# Patient Record
Sex: Male | Born: 1985 | Race: Black or African American | Hispanic: No | Marital: Single | Smoking: Never smoker
Health system: Southern US, Community
[De-identification: ages and names within clinical notes are randomized; demographics above are authoritative.]

## PROBLEM LIST (undated history)

## (undated) DIAGNOSIS — I2699 Other pulmonary embolism without acute cor pulmonale: Secondary | ICD-10-CM

## (undated) DIAGNOSIS — K219 Gastro-esophageal reflux disease without esophagitis: Secondary | ICD-10-CM

## (undated) DIAGNOSIS — R0789 Other chest pain: Secondary | ICD-10-CM

## (undated) DIAGNOSIS — I10 Essential (primary) hypertension: Secondary | ICD-10-CM

## (undated) DIAGNOSIS — E119 Type 2 diabetes mellitus without complications: Secondary | ICD-10-CM

## (undated) HISTORY — DX: Other pulmonary embolism without acute cor pulmonale: I26.99

## (undated) HISTORY — DX: Gastro-esophageal reflux disease without esophagitis: K21.9

## (undated) HISTORY — DX: Other chest pain: R07.89

## (undated) HISTORY — PX: NO PAST SURGERIES: SHX2092

## (undated) HISTORY — DX: Type 2 diabetes mellitus without complications: E11.9

---

## 2008-11-28 ENCOUNTER — Emergency Department (HOSPITAL_COMMUNITY): Admission: EM | Admit: 2008-11-28 | Discharge: 2008-11-28 | Payer: Self-pay | Admitting: Emergency Medicine

## 2012-10-14 ENCOUNTER — Emergency Department (HOSPITAL_COMMUNITY): Payer: Self-pay

## 2012-10-14 ENCOUNTER — Emergency Department (HOSPITAL_COMMUNITY)
Admission: EM | Admit: 2012-10-14 | Discharge: 2012-10-14 | Disposition: A | Discharge status: Home / Self Care | Payer: Self-pay | Attending: Emergency Medicine | Admitting: Emergency Medicine

## 2012-10-14 ENCOUNTER — Encounter (HOSPITAL_COMMUNITY): Payer: Self-pay | Admitting: Emergency Medicine

## 2012-10-14 DIAGNOSIS — Z87891 Personal history of nicotine dependence: Secondary | ICD-10-CM | POA: Insufficient documentation

## 2012-10-14 DIAGNOSIS — R079 Chest pain, unspecified: Secondary | ICD-10-CM | POA: Insufficient documentation

## 2012-10-14 LAB — TROPONIN I
Troponin I: <0.3 ng/mL (ref <0.30)
Troponin I: <0.3 ng/mL (ref <0.30)

## 2012-10-14 MED ORDER — ASPIRIN 81 MG PO CHEW: 324.0000 mg | CHEWABLE_TABLET | Freq: Once | ORAL | Status: DC

## 2012-10-14 NOTE — ED Notes (Signed)
Patient transported to X-ray 

## 2012-10-14 NOTE — ED Notes (Signed)
The pt is comfortable nsr on the monitor.  Mild dull chest pain.  famiily at the bedside.  Asking for po fluids will inquire
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
## 2014-10-28 ENCOUNTER — Encounter (HOSPITAL_COMMUNITY): Payer: Self-pay | Admitting: Emergency Medicine

## 2014-10-28 ENCOUNTER — Emergency Department (HOSPITAL_COMMUNITY)
Admission: EM | Admit: 2014-10-28 | Discharge: 2014-10-28 | Disposition: A | Discharge status: Home / Self Care | Payer: Self-pay | Attending: Emergency Medicine | Admitting: Emergency Medicine

## 2014-10-28 ENCOUNTER — Emergency Department (HOSPITAL_COMMUNITY): Payer: Self-pay

## 2014-10-28 DIAGNOSIS — S8011XA Contusion of right lower leg, initial encounter: Secondary | ICD-10-CM | POA: Insufficient documentation

## 2014-10-28 DIAGNOSIS — Y998 Other external cause status: Secondary | ICD-10-CM | POA: Insufficient documentation

## 2014-10-28 DIAGNOSIS — R000 Tachycardia, unspecified: Secondary | ICD-10-CM | POA: Insufficient documentation

## 2014-10-28 DIAGNOSIS — S20219A Contusion of unspecified front wall of thorax, initial encounter: Secondary | ICD-10-CM | POA: Insufficient documentation

## 2014-10-28 DIAGNOSIS — Z87891 Personal history of nicotine dependence: Secondary | ICD-10-CM | POA: Insufficient documentation

## 2014-10-28 DIAGNOSIS — Y92410 Unspecified street and highway as the place of occurrence of the external cause: Secondary | ICD-10-CM | POA: Insufficient documentation

## 2014-10-28 DIAGNOSIS — S20211A Contusion of right front wall of thorax, initial encounter: Secondary | ICD-10-CM

## 2014-10-28 DIAGNOSIS — Y9389 Activity, other specified: Secondary | ICD-10-CM | POA: Insufficient documentation

## 2014-10-28 MED ORDER — HYDROCODONE-ACETAMINOPHEN 7.5-325 MG PO TABS: 1.0000 | ORAL_TABLET | Freq: Four times a day (QID) | ORAL | Status: DC | PRN

## 2014-10-28 MED ORDER — HYDROCODONE-ACETAMINOPHEN 5-325 MG PO TABS
2.0000 | ORAL_TABLET | Freq: Once | ORAL | Status: AC
  Administered 2014-10-28: 2 via ORAL
  Filled 2014-10-28: qty 2

## 2014-10-28 MED ORDER — NAPROXEN 500 MG PO TABS: 500.0000 mg | ORAL_TABLET | Freq: Two times a day (BID) | ORAL | Status: DC
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
## 2014-10-28 NOTE — Discharge Instructions (Signed)
Return immediately for increased chest pain, shortness of breath, nausea, vomiting or other problems.
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
## 2015-07-09 ENCOUNTER — Encounter (HOSPITAL_COMMUNITY): Payer: Self-pay | Admitting: Emergency Medicine

## 2015-07-09 ENCOUNTER — Emergency Department (HOSPITAL_COMMUNITY)
Admission: EM | Admit: 2015-07-09 | Discharge: 2015-07-09 | Disposition: A | Discharge status: Home / Self Care | Payer: Self-pay | Attending: Emergency Medicine | Admitting: Emergency Medicine

## 2015-07-09 DIAGNOSIS — E669 Obesity, unspecified: Secondary | ICD-10-CM | POA: Insufficient documentation

## 2015-07-09 DIAGNOSIS — R350 Frequency of micturition: Secondary | ICD-10-CM | POA: Insufficient documentation

## 2015-07-09 DIAGNOSIS — I10 Essential (primary) hypertension: Secondary | ICD-10-CM | POA: Insufficient documentation

## 2015-07-09 DIAGNOSIS — H538 Other visual disturbances: Secondary | ICD-10-CM | POA: Insufficient documentation

## 2015-07-09 DIAGNOSIS — E119 Type 2 diabetes mellitus without complications: Secondary | ICD-10-CM | POA: Insufficient documentation

## 2015-07-09 DIAGNOSIS — Z79899 Other long term (current) drug therapy: Secondary | ICD-10-CM | POA: Insufficient documentation

## 2015-07-09 DIAGNOSIS — Z87891 Personal history of nicotine dependence: Secondary | ICD-10-CM | POA: Insufficient documentation

## 2015-07-09 HISTORY — DX: Essential (primary) hypertension: I10

## 2015-07-09 LAB — I-STAT CHEM 8, ED
BUN: 18 mg/dL (ref 6–20)
Calcium, Ion: 1.21 mmol/L (ref 1.12–1.23)
Chloride: 102 mmol/L (ref 101–111)
Creatinine, Ser: 1 mg/dL (ref 0.61–1.24)
Glucose, Bld: 422 mg/dL — ABNORMAL HIGH (ref 65–99)
HCT: 48 % (ref 39.0–52.0)
Hemoglobin: 16.3 g/dL (ref 13.0–17.0)
Potassium: 4.1 mmol/L (ref 3.5–5.1)
Sodium: 137 mmol/L (ref 135–145)
TCO2: 22 mmol/L (ref 0–100)

## 2015-07-09 LAB — CBG MONITORING, ED
Glucose-Capillary: 375 mg/dL — ABNORMAL HIGH (ref 65–99)
Glucose-Capillary: 280 mg/dL — ABNORMAL HIGH (ref 65–99)

## 2015-07-09 MED ORDER — METFORMIN HCL 500 MG PO TABS: 500.0000 mg | ORAL_TABLET | Freq: Two times a day (BID) | ORAL | Status: DC

## 2015-07-09 MED ORDER — SODIUM CHLORIDE 0.9 % IV BOLUS (SEPSIS)
1000.0000 mL | Freq: Once | INTRAVENOUS | Status: AC
  Administered 2015-07-09: 1000 mL via INTRAVENOUS

## 2015-07-09 NOTE — ED Provider Notes (Addendum)
CSN: [id number]     Arrival date & time 07/09/15  1253 History   First MD Initiated Contact with Patient 07/09/15 1315     Chief Complaint  Patient presents with  . Blurred Vision  . Dizziness  . Urinary Frequency     (Consider location/radiation/quality/duration/timing/severity/associated sxs/prior Treatment) HPI Comments: Patient is a 29 year old obese male who presents today with a one-week history of polyuria, polydipsia, lightheadedness, blurred vision that has been worsening over the last week. he denies any fever, abdominal pain or vomiting. He has no prior medical history and denies taking any medications at this time. No fever or infectious symptoms at this time  Patient is a 29 y.o. male presenting with dizziness and frequency. The history is provided by the patient.  Dizziness Urinary Frequency    Past Medical History  Diagnosis Date  . Hypertension    History reviewed. No pertinent past surgical history. Family History  Problem Relation Age of Onset  . Diabetes Other    Social History  Substance Use Topics  . Smoking status: Former Games developermoker  . Smokeless tobacco: Never Used  . Alcohol Use: No    Review of Systems  Genitourinary: Positive for frequency.  Neurological: Positive for dizziness.  All other systems reviewed and are negative.     Allergies  Review of patient's allergies indicates no known allergies.  Home Medications   Prior to Admission medications   Medication Sig Start Date End Date Taking? Authorizing Provider  Multiple Vitamins-Minerals (MULTIVITAMIN GUMMIES ADULT PO) Take 1 tablet by mouth daily.   Yes Historical Provider, MD   BP 158/89 mmHg  Pulse 94  Temp(Src) 98.8 F (37.1 C) (Oral)  Resp 19  Ht 5' 9.5" (1.765 m)  Wt 369 lb 7 oz (167.576 kg)  BMI 53.79 kg/m2  SpO2 97% Physical Exam  Constitutional: He is oriented to person, place, and time. He appears well-developed and well-nourished. No distress.  Obese male  HENT:   Head: Normocephalic and atraumatic.  Mouth/Throat: Oropharynx is clear and moist.  Eyes: Conjunctivae and EOM are normal. Pupils are equal, round, and reactive to light.  Neck: Normal range of motion. Neck supple.  Cardiovascular: Normal rate, regular rhythm and intact distal pulses.   No murmur heard. Pulmonary/Chest: Effort normal and breath sounds normal. No respiratory distress. He has no wheezes. He has no rales.  Abdominal: Soft. He exhibits no distension. There is no tenderness. There is no rebound and no guarding.  Musculoskeletal: Normal range of motion. He exhibits no edema or tenderness.  Neurological: He is alert and oriented to person, place, and time.  Skin: Skin is warm and dry. No rash noted. No erythema.  Psychiatric: He has a normal mood and affect. His behavior is normal.  Nursing note and vitals reviewed.   ED Course  Procedures (including critical care time) Labs Review Labs Reviewed  CBG MONITORING, ED - Abnormal; Notable for the following:    Glucose-Capillary 375 (*)    All other components within normal limits  I-STAT CHEM 8, ED - Abnormal; Notable for the following:    Glucose, Bld 422 (*)    All other components within normal limits    Imaging Review No results found. I have personally reviewed and evaluated these images and lab results as part of my medical decision-making.   EKG Interpretation None      MDM   Final diagnoses:  New onset type 2 diabetes mellitus (HCC)    Patient is a 29 year old male complaining  of multiple vague complaints most consistent with hyperglycemia. Fingerstick blood sugar today showed a blood sugar of 375. Chem-8 with a blood sugar of 422 but otherwise normal renal function. Patient given IV fluids and will be started on metformin. Discussed with him diet changes and weight loss. Patient given follow-up with [facility].    Gwyneth Sprout, MD 07/09/15 1502  Gwyneth Sprout, MD 07/09/15 1525

## 2015-07-09 NOTE — ED Notes (Signed)
Pt c/o blurred vision since last Friday.  Pt states that he doesn't wear glasses or contacts.  Pt also had dizziness and increased frequency in urination since Tuesday last week.

## 2015-07-26 ENCOUNTER — Ambulatory Visit: Payer: Self-pay | Admitting: Family Medicine

## 2015-07-29 ENCOUNTER — Ambulatory Visit: Payer: Self-pay | Attending: Family Medicine | Admitting: Family Medicine

## 2015-07-29 ENCOUNTER — Encounter: Payer: Self-pay | Admitting: Family Medicine

## 2015-07-29 VITALS — BP 140/100 | HR 80 | Temp 99.1°F | Resp 15 | Ht 69.0 in | Wt 359.4 lb

## 2015-07-29 DIAGNOSIS — E119 Type 2 diabetes mellitus without complications: Secondary | ICD-10-CM

## 2015-07-29 DIAGNOSIS — I10 Essential (primary) hypertension: Secondary | ICD-10-CM | POA: Insufficient documentation

## 2015-07-29 DIAGNOSIS — Z0000 Encounter for general adult medical examination without abnormal findings: Secondary | ICD-10-CM

## 2015-07-29 DIAGNOSIS — E669 Obesity, unspecified: Secondary | ICD-10-CM | POA: Insufficient documentation

## 2015-07-29 DIAGNOSIS — Z6841 Body Mass Index (BMI) 40.0 and over, adult: Secondary | ICD-10-CM | POA: Insufficient documentation

## 2015-07-29 LAB — GLUCOSE, POCT (MANUAL RESULT ENTRY): POC Glucose: 308 mg/dl — AB (ref 70–99)

## 2015-07-29 LAB — POCT GLYCOSYLATED HEMOGLOBIN (HGB A1C): Hemoglobin A1C: 11.5

## 2015-07-29 MED ORDER — TRUEPLUS LANCETS 28G MISC: 1.0000 | Freq: Three times a day (TID) | Status: DC

## 2015-07-29 MED ORDER — TRUE METRIX METER DEVI: 1.0000 | Freq: Three times a day (TID) | Status: DC

## 2015-07-29 MED ORDER — LISINOPRIL 5 MG PO TABS: 5.0000 mg | ORAL_TABLET | Freq: Every day | ORAL | Status: DC

## 2015-07-29 MED ORDER — GLUCOSE BLOOD VI STRP: ORAL_STRIP | Status: DC

## 2015-07-29 MED ORDER — GLIPIZIDE 10 MG PO TABS: 10.0000 mg | ORAL_TABLET | Freq: Two times a day (BID) | ORAL | Status: DC

## 2015-07-29 NOTE — Patient Instructions (Signed)
Diabetes Mellitus and Food It is important for you to manage your blood sugar (glucose) level. Your blood glucose level can be greatly affected by what you eat. Eating healthier foods in the appropriate amounts throughout the day at about the same time each day will help you control your blood glucose level. It can also help slow or prevent worsening of your diabetes mellitus. Healthy eating may even help you improve the level of your blood pressure and reach or maintain a healthy weight.  General recommendations for healthful eating and cooking habits include:  Eating meals and snacks regularly. Avoid going long periods of time without eating to lose weight.  Eating a diet that consists mainly of plant-based foods, such as fruits, vegetables, nuts, legumes, and whole grains.  Using low-heat cooking methods, such as baking, instead of high-heat cooking methods, such as deep frying. Work with your dietitian to make sure you understand how to use the Nutrition Facts information on food labels. HOW CAN FOOD AFFECT ME? Carbohydrates Carbohydrates affect your blood glucose level more than any other type of food. Your dietitian will help you determine how many carbohydrates to eat at each meal and teach you how to count carbohydrates. Counting carbohydrates is important to keep your blood glucose at a healthy level, especially if you are using insulin or taking certain medicines for diabetes mellitus. Alcohol Alcohol can cause sudden decreases in blood glucose (hypoglycemia), especially if you use insulin or take certain medicines for diabetes mellitus. Hypoglycemia can be a life-threatening condition. Symptoms of hypoglycemia (sleepiness, dizziness, and disorientation) are similar to symptoms of having too much alcohol.  If your health care provider has given you approval to drink alcohol, do so in moderation and use the following guidelines:  Women should not have more than one drink per day, and men  should not have more than two drinks per day. One drink is equal to:  12 oz of beer.  5 oz of wine.  1 oz of hard liquor.  Do not drink on an empty stomach.  Keep yourself hydrated. Have water, diet soda, or unsweetened iced tea.  Regular soda, juice, and other mixers might contain a lot of carbohydrates and should be counted. WHAT FOODS ARE NOT RECOMMENDED? As you make food choices, it is important to remember that all foods are not the same. Some foods have fewer nutrients per serving than other foods, even though they might have the same number of calories or carbohydrates. It is difficult to get your body what it needs when you eat foods with fewer nutrients. Examples of foods that you should avoid that are high in calories and carbohydrates but low in nutrients include:  Trans fats (most processed foods list trans fats on the Nutrition Facts label).  Regular soda.  Juice.  Candy.  Sweets, such as cake, pie, doughnuts, and cookies.  Fried foods. WHAT FOODS CAN I EAT? Eat nutrient-rich foods, which will nourish your body and keep you healthy. The food you should eat also will depend on several factors, including:  The calories you need.  The medicines you take.  Your weight.  Your blood glucose level.  Your blood pressure level.  Your cholesterol level. You should eat a variety of foods, including:  Protein.  Lean cuts of meat.  Proteins low in saturated fats, such as fish, egg whites, and beans. Avoid processed meats.  Fruits and vegetables.  Fruits and vegetables that may help control blood glucose levels, such as apples, mangoes, and   yams.  Dairy products.  Choose fat-free or low-fat dairy products, such as milk, yogurt, and cheese.  Grains, bread, pasta, and rice.  Choose whole grain products, such as multigrain bread, whole oats, and brown rice. These foods may help control blood pressure.  Fats.  Foods containing healthful fats, such as nuts,  avocado, olive oil, canola oil, and fish. DOES EVERYONE WITH DIABETES MELLITUS HAVE THE SAME MEAL PLAN? Because every person with diabetes mellitus is different, there is not one meal plan that works for everyone. It is very important that you meet with a dietitian who will help you create a meal plan that is just right for you.   This information is not intended to replace advice given to you by your health care provider. Make sure you discuss any questions you have with your health care provider.   Document Released: 05/14/2005 Document Revised: 09/07/2014 Document Reviewed: 07/14/2013 Elsevier Interactive Patient Education 2016 [employer]  

## 2015-07-29 NOTE — Progress Notes (Signed)
Patient here to establish care after recent diagnosis of DM2 He has been taking his metformin and reports he did have gastrointestinal "issues" mainly cramping that has slowly gotten better He did not take his metformin today and he has not eaten

## 2015-07-29 NOTE — Progress Notes (Signed)
CC: ED follow-up for newly diagnosed type 2 diabetes mellitus.  HPI: William Moss is a 29 y.o. male who was seen at [facility] ED on 07/09/15 after he had presented with abdominal pain and vomiting and was found to have elevated blood sugar of 422, and he was diagnosed with type 2 diabetes mellitus and commenced on metformin.  He reports that he initially had some gastrointestinal upset and diarrhea after starting the metformin but this has improved. His blood sugar is elevated at 308 this morning and he admits to forgetting to take his morning dose of metformin.  Patient has No headache, No chest pain, No abdominal pain - No Nausea, No new weakness tingling or numbness, No Cough - SOB.  No Known Allergies Past Medical History  Diagnosis Date  . Hypertension    Current Outpatient Prescriptions on File Prior to Visit  Medication Sig Dispense Refill  . metFORMIN (GLUCOPHAGE) 500 MG tablet Take 1 tablet (500 mg total) by mouth 2 (two) times daily with a meal. 60 tablet 3  . Multiple Vitamins-Minerals (MULTIVITAMIN GUMMIES ADULT PO) Take 1 tablet by mouth daily.     No current facility-administered medications on file prior to visit.   Family History  Problem Relation Age of Onset  . Diabetes Other    Social History   Social History  . Marital Status: Single    Spouse Name: N/A  . Number of Children: N/A  . Years of Education: N/A   Occupational History  . Not on file.   Social History Main Topics  . Smoking status: Former Games developer  . Smokeless tobacco: Never Used  . Alcohol Use: No  . Drug Use: No  . Sexual Activity: Not on file   Other Topics Concern  . Not on file   Social History Narrative    Review of Systems: Constitutional: Negative for fever, chills, diaphoresis, activity change, appetite change and fatigue. HENT: Negative for ear pain, nosebleeds, congestion, facial swelling, rhinorrhea, neck pain, neck stiffness and ear discharge.  Eyes: Negative for pain,  discharge, redness, itching and visual disturbance. Respiratory: Negative for cough, choking, chest tightness, shortness of breath, wheezing and stridor.  Cardiovascular: Negative for chest pain, palpitations and leg swelling. Gastrointestinal: Negative for abdominal distention. Genitourinary: Negative for dysuria, urgency, frequency, hematuria, flank pain, decreased urine volume, difficulty urinating and dyspareunia.  Musculoskeletal: Negative for back pain, joint swelling, arthralgias and gait problem. Neurological: Negative for dizziness, tremors, seizures, syncope, facial asymmetry, speech difficulty, weakness, light-headedness, numbness and headaches.  Hematological: Negative for adenopathy. Does not bruise/bleed easily. Psychiatric/Behavioral: Negative for hallucinations, behavioral problems, confusion, dysphoric mood, decreased concentration and agitation.    Objective: Filed Vitals:   07/29/15 1155 07/29/15 1156  BP: 143/103 140/100  Pulse: 80   Temp: 99.1 F (37.3 C)   Resp: 15   Height:  (1.753 m)   Weight: 359 lb 6.4 oz (163.023 kg)   SpO2: 95%       Physical Exam: Constitutional: Patient is morbidly obese, not in acute distress. HENT: Normocephalic, atraumatic, External right and left ear normal. Oropharynx is clear and moist.  Eyes: Conjunctivae and EOM are normal. PERRLA, no scleral icterus. Neck: Normal ROM. Neck supple. No JVD. No tracheal deviation. No thyromegaly. CVS: RRR, S1/S2 +, no murmurs, no gallops, no carotid bruit.  Pulmonary: Effort and breath sounds normal, no stridor, rhonchi, wheezes, rales.  Abdominal: Soft. BS +,  no distension, tenderness, rebound or guarding.  Musculoskeletal: Normal range of motion. No edema  and no tenderness.  Lymphadenopathy: No lymphadenopathy noted, cervical, inguinal or axillary Neuro: Alert. Normal reflexes, muscle tone coordination. No cranial nerve deficit. Skin: Skin is warm and dry. No rash noted. Not diaphoretic.  No erythema. No pallor. Psychiatric: Normal mood and affect. Behavior, judgment, thought content normal.  Lab Results  Component Value Date   HGB 16.3 07/09/2015   HCT 48.0 07/09/2015   Lab Results  Component Value Date   CREATININE 1.00 07/09/2015   BUN 18 07/09/2015   NA 137 07/09/2015   K 4.1 07/09/2015   CL 102 07/09/2015    Lab Results  Component Value Date   HGBA1C 11.50 07/29/2015      Assessment and plan:  Type 2 diabetes mellitus: Newly diagnosed. Uncontrolled with A1c of 11.5, CBG of 308. Patient forgot to take morning dose of metformin. Advised on compliance. Glipizide added to regimen; he will need an increased dose of metformin since he is skeptical to going on insulin and this will be determined at his next office visit after review of his blood sugar log. Prescription for testing supplies written and he will follow up with the clinical pharmacist for diabetic education. Will discuss diabetic healthcare maintenance at his next office visit as he seems to be overwhelmed with the new diagnosis.  Hypertension: Commenced on low-dose ACE inhibitor for renal protection.  Obesity: Discussed reducing portion sizes, increasing physical activity and an exercise regimen.  This note has been created with Education officer, environmentalDragon speech recognition software and smart phrase technology. Any transcriptional errors are unintentional.         Jaclyn ShaggyEnobong, Amao, MD. [facility] [phone] 07/29/2015, 11:46 AM

## 2015-08-09 ENCOUNTER — Telehealth: Payer: Self-pay | Admitting: Family Medicine

## 2015-08-09 NOTE — Telephone Encounter (Signed)
Pt. Came in to drop off [employer] paperwork for his PCP to fill out. This paperwork is for his job. Pt. Would like paperwork to be faxed to [phone]. Please f/u

## 2015-08-15 ENCOUNTER — Encounter: Payer: Self-pay | Admitting: *Deleted

## 2015-08-15 ENCOUNTER — Telehealth (HOSPITAL_COMMUNITY): Payer: Self-pay | Admitting: *Deleted

## 2015-08-15 NOTE — Telephone Encounter (Signed)
error 

## 2015-08-15 NOTE — Telephone Encounter (Signed)
Dr. Venetia NightAmao asked RN to call patient to ask why he is asking for FMLA paperwork to be filled out.  She needs clarification because she is unaware of any diagnosis that would prevent him from working at this time.  RN left HIPAA compliant message for patient to return RN cal at [phone]

## 2015-08-19 NOTE — Telephone Encounter (Signed)
Left HIPAA message for patient to return call.  His paperwork is complete

## 2015-08-19 NOTE — Telephone Encounter (Signed)
Paperwork faxed to [employer] for patient.  Left HIPAA compliant message for patient to return call.  Paperwork left in bin at front to be scanned into medical record.

## 2015-08-19 NOTE — Telephone Encounter (Signed)
Patient state she needs FMLA because he has no time off from work and it is difficult for him to come to MD appointments to manage his chronic diseases. RN will route to MD.

## 2015-08-19 NOTE — Telephone Encounter (Signed)
Ready for pick up

## 2015-08-19 NOTE — Telephone Encounter (Signed)
FMLA paperwork faxed to [phone]  Paperwork placed in box to be scanned into patient chart

## 2015-08-20 ENCOUNTER — Ambulatory Visit: Payer: Self-pay | Attending: Family Medicine | Admitting: Pharmacist

## 2015-08-20 VITALS — BP 136/82 | HR 100

## 2015-08-20 DIAGNOSIS — E119 Type 2 diabetes mellitus without complications: Secondary | ICD-10-CM | POA: Insufficient documentation

## 2015-08-20 NOTE — Patient Instructions (Signed)
Thanks for coming to see me!  You are doing a great job so far! Keep up the good work.  Come back and see me if you have questions about anything we reviewed. Otherwise, follow up with Dr. Venetia Night.   Blood Glucose Monitoring, Adult Monitoring your blood glucose (also know as blood sugar) helps you to manage your diabetes. It also helps you and your health care provider monitor your diabetes and determine how well your treatment plan is working. WHY SHOULD YOU MONITOR YOUR BLOOD GLUCOSE?  It can help you understand how food, exercise, and medicine affect your blood glucose.  It allows you to know what your blood glucose is at any given moment. You can quickly tell if you are having low blood glucose (hypoglycemia) or high blood glucose (hyperglycemia).  It can help you and your health care provider know how to adjust your medicines.  It can help you understand how to manage an illness or adjust medicine for exercise. WHEN SHOULD YOU TEST? Your health care provider will help you decide how often you should check your blood glucose. This may depend on the type of diabetes you have, your diabetes control, or the types of medicines you are taking. Be sure to write down all of your blood glucose readings so that this information can be reviewed with your health care provider. See below for examples of testing times that your health care provider may suggest. Type 1 Diabetes  Test at least 2 times per day if your diabetes is well controlled, if you are using an insulin pump, or if you perform multiple daily injections.  If your diabetes is not well controlled or if you are sick, you may need to test more often.  It is a good idea to also test:  Before every insulin injection.  Before and after exercise.  Between meals and 2 hours after a meal.  Occasionally between 2:00 a.m. and 3:00 a.m. Type 2 Diabetes  If you are taking insulin, test at least 2 times per day. However, it is best to test  before every insulin injection.  If you take medicines by mouth (orally), test 2 times a day.  If you are on a controlled diet, test once a day.  If your diabetes is not well controlled or if you are sick, you may need to monitor more often. HOW TO MONITOR YOUR BLOOD GLUCOSE Supplies Needed  Blood glucose meter.  Test strips for your meter. Each meter has its own strips. You must use the strips that go with your own meter.  A pricking needle (lancet).  A device that holds the lancet (lancing device).  A journal or log book to write down your results. Procedure  Wash your hands with soap and water. Alcohol is not preferred.  Prick the side of your finger (not the tip) with the lancet.  Gently milk the finger until a small drop of blood appears.  Follow the instructions that come with your meter for inserting the test strip, applying blood to the strip, and using your blood glucose meter. Other Areas to Get Blood for Testing Some meters allow you to use other areas of your body (other than your finger) to test your blood. These areas are called alternative sites. The most common alternative sites are:  The forearm.  The thigh.  The back area of the lower leg.  The palm of the hand. The blood flow in these areas is slower. Therefore, the blood glucose values you  get may be delayed, and the numbers are different from what you would get from your fingers. Do not use alternative sites if you think you are having hypoglycemia. Your reading will not be accurate. Always use a finger if you are having hypoglycemia. Also, if you cannot feel your lows (hypoglycemia unawareness), always use your fingers for your blood glucose checks. ADDITIONAL TIPS FOR GLUCOSE MONITORING  Do not reuse lancets.  Always carry your supplies with you.  All blood glucose meters have a 24-hour "hotline" number to call if you have questions or need help.  Adjust (calibrate) your blood glucose meter with a  control solution after finishing a few boxes of strips. BLOOD GLUCOSE RECORD KEEPING It is a good idea to keep a daily record or log of your blood glucose readings. Most glucose meters, if not all, keep your glucose records stored in the meter. Some meters come with the ability to download your records to your home computer. Keeping a record of your blood glucose readings is especially helpful if you are wanting to look for patterns. Make notes to go along with the blood glucose readings because you might forget what happened at that exact time. Keeping good records helps you and your health care provider to work together to achieve good diabetes management.    This information is not intended to replace advice given to you by your health care provider. Make sure you discuss any questions you have with your health care provider.   Document Released: 08/20/2003 Document Revised: 09/07/2014 Document Reviewed: 01/09/2013 Elsevier Interactive Patient Education 2016 Elsevier Inc.   Basic Carbohydrate Counting for Diabetes Mellitus Carbohydrate counting is a method for keeping track of the amount of carbohydrates you eat. Eating carbohydrates naturally increases the level of sugar (glucose) in your blood, so it is important for you to know the amount that is okay for you to have in every meal. Carbohydrate counting helps keep the level of glucose in your blood within normal limits. The amount of carbohydrates allowed is different for every person. A dietitian can help you calculate the amount that is right for you. Once you know the amount of carbohydrates you can have, you can count the carbohydrates in the foods you want to eat. Carbohydrates are found in the following foods:  Grains, such as breads and cereals.  Dried beans and soy products.  Starchy vegetables, such as potatoes, peas, and corn.  Fruit and fruit juices.  Milk and yogurt.  Sweets and snack foods, such as cake, cookies, candy,  chips, soft drinks, and fruit drinks. CARBOHYDRATE COUNTING There are two ways to count the carbohydrates in your food. You can use either of the methods or a combination of both. Reading the "Nutrition Facts" on Packaged Food The "Nutrition Facts" is an area that is included on the labels of almost all packaged food and beverages in the [place]. It includes the serving size of that food or beverage and information about the nutrients in each serving of the food, including the grams (g) of carbohydrate per serving.  Decide the number of servings of this food or beverage that you will be able to eat or drink. Multiply that number of servings by the number of grams of carbohydrate that is listed on the label for that serving. The total will be the amount of carbohydrates you will be having when you eat or drink this food or beverage. Learning Standard Serving Sizes of Food When you eat food that is  not packaged or does not include "Nutrition Facts" on the label, you need to measure the servings in order to count the amount of carbohydrates.A serving of most carbohydrate-rich foods contains about 15 g of carbohydrates. The following list includes serving sizes of carbohydrate-rich foods that provide 15 g ofcarbohydrate per serving:   1 slice of bread (1 oz) or 1 six-inch tortilla.    of a hamburger bun or English muffin.  4-6 crackers.   cup unsweetened dry cereal.    cup hot cereal.   cup rice or pasta.    cup mashed potatoes or  of a large baked potato.  1 cup fresh fruit or one small piece of fruit.    cup canned or frozen fruit or fruit juice.  1 cup milk.   cup plain fat-free yogurt or yogurt sweetened with artificial sweeteners.   cup cooked dried beans or starchy vegetable, such as peas, corn, or potatoes.  Decide the number of standard-size servings that you will eat. Multiply that number of servings by 15 (the grams of carbohydrates in that serving). For  example, if you eat 2 cups of strawberries, you will have eaten 2 servings and 30 g of carbohydrates (2 servings x 15 g = 30 g). For foods such as soups and casseroles, in which more than one food is mixed in, you will need to count the carbohydrates in each food that is included. EXAMPLE OF CARBOHYDRATE COUNTING Sample Dinner  3 oz chicken breast.   cup of brown rice.   cup of corn.  1 cup milk.   1 cup strawberries with sugar-free whipped topping.  Carbohydrate Calculation Step 1: Identify the foods that contain carbohydrates:   Rice.   Corn.   Milk.   Strawberries. Step 2:Calculate the number of servings eaten of each:   2 servings of rice.   1 serving of corn.   1 serving of milk.   1 serving of strawberries. Step 3: Multiply each of those number of servings by 15 g:   2 servings of rice x 15 g = 30 g.   1 serving of corn x 15 g = 15 g.   1 serving of milk x 15 g = 15 g.   1 serving of strawberries x 15 g = 15 g. Step 4: Add together all of the amounts to find the total grams of carbohydrates eaten: 30 g + 15 g + 15 g + 15 g = 75 g.   This information is not intended to replace advice given to you by your health care provider. Make sure you discuss any questions you have with your health care provider.   Document Released: 08/17/2005 Document Revised: 09/07/2014 Document Reviewed: 07/14/2013 Elsevier Interactive Patient Education [employer]

## 2015-08-20 NOTE — Progress Notes (Signed)
S:    Patient arrives in good spirits.  Presents for diabetes education. Patient reports Diabetes was diagnosed in last month.  Patient reports adherence with medications. Current diabetes medications include   Patient reports hypoglycemic events. He had one reading of 68 after he didn't eat much that day.   Patient reported dietary habits: he is trying to watch what he eats but it has come to the point that he is almost afraid to eat.  Patient reported exercise habits: hasn't been working on exercise yet.   Patient reports nocturia.  Patient denies neuropathy. Patient denies visual changes. Patient reports self foot exams.    O:  Lab Results  Component Value Date   HGBA1C 11.50 07/29/2015    Home fasting CBG: 80s-200s (recently <120) 2 hour post-prandial/random CBG: 100s -200s (recently <180).  A/P: Diabetes newly diagnosed currently uncontrolled based on A1c of 11.5 but under improved control based on home CBGs.   Patient reports hypoglycemic events and is able to verbalize appropriate hypoglycemia management plan.  Patient reports adherence with medication. Control is suboptimal due to dietary indiscretion and sedentary lifestyle.  Continue all medications as prescribed. His CBGs have greatly improved over time and appear to be at goal now. Provided education on A1c, goal blood glucose, hypo and hyperglycemia, dietary changes (plate method and basic carb counting), and exercise. Patient verbalized understanding and all questions were answered.   Next A1C anticipated March 2017.    Written patient instructions provided.  Total time in face to face counseling 20 minutes.  Follow up in Pharmacist Clinic Visit as needed, next visit with Dr. Venetia NightAmao.

## 2015-08-28 ENCOUNTER — Encounter: Payer: Self-pay | Admitting: Family Medicine

## 2015-08-28 ENCOUNTER — Ambulatory Visit: Payer: Self-pay | Attending: Family Medicine | Admitting: Family Medicine

## 2015-08-28 VITALS — BP 153/96 | HR 98 | Temp 98.9°F | Resp 18 | Ht 69.0 in | Wt 364.0 lb

## 2015-08-28 DIAGNOSIS — E669 Obesity, unspecified: Secondary | ICD-10-CM | POA: Insufficient documentation

## 2015-08-28 DIAGNOSIS — Z7984 Long term (current) use of oral hypoglycemic drugs: Secondary | ICD-10-CM | POA: Insufficient documentation

## 2015-08-28 DIAGNOSIS — R197 Diarrhea, unspecified: Secondary | ICD-10-CM | POA: Insufficient documentation

## 2015-08-28 DIAGNOSIS — I10 Essential (primary) hypertension: Secondary | ICD-10-CM

## 2015-08-28 DIAGNOSIS — Z6841 Body Mass Index (BMI) 40.0 and over, adult: Secondary | ICD-10-CM | POA: Insufficient documentation

## 2015-08-28 DIAGNOSIS — E119 Type 2 diabetes mellitus without complications: Secondary | ICD-10-CM

## 2015-08-28 DIAGNOSIS — Z23 Encounter for immunization: Secondary | ICD-10-CM

## 2015-08-28 DIAGNOSIS — K921 Melena: Secondary | ICD-10-CM

## 2015-08-28 DIAGNOSIS — R739 Hyperglycemia, unspecified: Secondary | ICD-10-CM

## 2015-08-28 DIAGNOSIS — Z794 Long term (current) use of insulin: Secondary | ICD-10-CM | POA: Insufficient documentation

## 2015-08-28 LAB — MICROALBUMIN / CREATININE URINE RATIO
Creatinine, Urine: 197 mg/dL (ref 20–370)
Microalb Creat Ratio: 14 mcg/mg creat (ref <30)
Microalb, Ur: 2.8 mg/dL

## 2015-08-28 LAB — GLUCOSE, POCT (MANUAL RESULT ENTRY): POC Glucose: 128 mg/dl — AB (ref 70–99)

## 2015-08-28 MED ORDER — GLIPIZIDE 10 MG PO TABS: 10.0000 mg | ORAL_TABLET | Freq: Two times a day (BID) | ORAL | Status: DC

## 2015-08-28 MED ORDER — LISINOPRIL 5 MG PO TABS: 5.0000 mg | ORAL_TABLET | Freq: Every day | ORAL | Status: DC

## 2015-08-28 MED ORDER — HYDROCORTISONE ACETATE 25 MG RE SUPP: 25.0000 mg | Freq: Two times a day (BID) | RECTAL | Status: DC

## 2015-08-28 NOTE — Progress Notes (Signed)
Patient's here for f/up diabetes.   Patient concern with side effect from using glipizide with blood in stool and diarrhea constant off and on past  2days.   Patient requesting refills on meds.  Patient reports taking his meds today.

## 2015-08-28 NOTE — Progress Notes (Signed)
Subjective:    Patient ID: William Moss, male    DOB: [birth year], 29 y.o.   MRN: [id number]  HPI  29 year old male with newly diagnosed type 2 diabetes mellitus ((A1c 11.5) who is currently on metformin and glipizide which he has been compliant with. He saw the clinical pharmacist for diabetic education and is here today with his blood sugar logs which revealed that his fasting sugars have been in the 80-120 range and random sugars have been less than 200.   He complains of a 7 day history of diarrhea in which he moves his bowels about 3 times a day and has also noticed a tinge of blood in his stool and is wondering if this is a side effect of his glipizide. Denies abdominal pain or nausea or loss of appetite. His blood pressures elevated and he endorses taking his antihypertensive today.  Past Medical History  Diagnosis Date  . Hypertension    History reviewed. No pertinent past surgical history.  Social History   Social History  . Marital Status: Single    Spouse Name: N/A  . Number of Children: N/A  . Years of Education: N/A   Occupational History  . Not on file.   Social History Main Topics  . Smoking status: Never Smoker   . Smokeless tobacco: Never Used  . Alcohol Use: No  . Drug Use: No  . Sexual Activity: Not on file   Other Topics Concern  . Not on file   Social History Narrative    No Known Allergies  Current Outpatient Prescriptions on File Prior to Visit  Medication Sig Dispense Refill  . Blood Glucose Monitoring Suppl (TRUE METRIX METER) DEVI 1 each by Does not apply route 3 (three) times daily before meals. 1 Device 0  . glucose blood (TRUE METRIX BLOOD GLUCOSE TEST) test strip 3 times daily before meals 100 each 12  . metFORMIN (GLUCOPHAGE) 500 MG tablet Take 1 tablet (500 mg total) by mouth 2 (two) times daily with a meal. 60 tablet 3  . Multiple Vitamins-Minerals (MULTIVITAMIN GUMMIES ADULT PO) Take 1 tablet by mouth daily.    . TRUEPLUS LANCETS 28G  MISC 1 each by Does not apply route 3 (three) times daily before meals. 100 each 12   No current facility-administered medications on file prior to visit.     Review of Systems  Constitutional: Negative for activity change and appetite change.  HENT: Negative for sinus pressure and sore throat.   Respiratory: Negative for chest tightness, shortness of breath and wheezing.   Cardiovascular: Positive for chest pain and palpitations.  Gastrointestinal:       See history of present illness  Genitourinary: Negative.   Musculoskeletal: Negative.   Psychiatric/Behavioral: Negative for behavioral problems and dysphoric mood.       Objective: Filed Vitals:   08/28/15 1601  BP: 153/96  Pulse: 98  Temp: 98.9 F (37.2 C)  TempSrc: Oral  Resp: 18  Height: 5\' 9"  (1.753 m)  Weight: 364 lb (165.109 kg)  SpO2: 96%      Physical Exam  Constitutional: He is oriented to person, place, and time. He appears well-developed and well-nourished.  Morbidly obese  Cardiovascular: Normal rate, normal heart sounds and intact distal pulses.   No murmur heard. Pulmonary/Chest: Effort normal and breath sounds normal. He has no wheezes. He has no rales. He exhibits no tenderness.  Abdominal: Soft. Bowel sounds are normal. He exhibits no distension and no mass. There is no tenderness.  Musculoskeletal: Normal range of motion.  Neurological: He is alert and oriented to person, place, and time.          Assessment & Plan:  Type 2 diabetes mellitus: Newly diagnosed. Uncontrolled with A1c of 11.5, CBG of 128  sugars are improving based on reported home blood  Sugars.  Diarrhea could be a side effect of metformin however given acute duration I will hold off on discontinuing this due to his resistance to initiating insulin until his next visit as this could also be symptoms of a of stomach virus.  Microalbumin performed today, Pneumovax today , foot exam today , fasting labs ordered and he has been  advised to schedule an annual eye exam with an optometrist or ophthalmologist.  Hypertension:  Blood pressure is elevated. We'll hold off on making any regimen changes and he has been advised to comply with his medications, low-sodium diet, lifestyle modification , DASH diet.  Hematochezia: Cannot exclude hemorrhoids. Placed on rectal suppository as presumptive therapy for hemorrhoids. We'll send off CBC meanwhile.  Obesity: Discussed reducing portion sizes, increasing physical activity and an exercise regimen.  This note has been created with Education officer, environmental. Any transcriptional errors are unintentional.

## 2015-08-29 ENCOUNTER — Ambulatory Visit: Payer: Self-pay | Attending: Family Medicine

## 2015-08-29 DIAGNOSIS — K921 Melena: Secondary | ICD-10-CM

## 2015-08-29 DIAGNOSIS — E119 Type 2 diabetes mellitus without complications: Secondary | ICD-10-CM

## 2015-08-29 LAB — COMPLETE METABOLIC PANEL WITH GFR
ALT: 29 U/L (ref 9–46)
AST: 15 U/L (ref 10–40)
Albumin: 4 g/dL (ref 3.6–5.1)
Alkaline Phosphatase: 45 U/L (ref 40–115)
BUN: 14 mg/dL (ref 7–25)
CO2: 22 mmol/L (ref 20–31)
Calcium: 9.2 mg/dL (ref 8.6–10.3)
Chloride: 108 mmol/L (ref 98–110)
Creat: 0.94 mg/dL (ref 0.60–1.35)
GFR, Est African American: >89 mL/min (ref >=60)
GFR, Est Non African American: >89 mL/min (ref >=60)
Glucose, Bld: 89 mg/dL (ref 65–99)
Potassium: 4.3 mmol/L (ref 3.5–5.3)
Sodium: 142 mmol/L (ref 135–146)
Total Bilirubin: 0.3 mg/dL (ref 0.2–1.2)
Total Protein: 6.9 g/dL (ref 6.1–8.1)

## 2015-08-29 LAB — CBC WITH DIFFERENTIAL/PLATELET
Basophils Absolute: 0 10*3/uL (ref 0.0–0.1)
Basophils Relative: 0 % (ref 0–1)
Eosinophils Absolute: 0.3 10*3/uL (ref 0.0–0.7)
Eosinophils Relative: 5 % (ref 0–5)
HCT: 39.2 % (ref 39.0–52.0)
Hemoglobin: 13.9 g/dL (ref 13.0–17.0)
Lymphocytes Relative: 38 % (ref 12–46)
Lymphs Abs: 2.4 10*3/uL (ref 0.7–4.0)
MCH: 29.5 pg (ref 26.0–34.0)
MCHC: 35.5 g/dL (ref 30.0–36.0)
MCV: 83.2 fL (ref 78.0–100.0)
MPV: 10.3 fL (ref 8.6–12.4)
Monocytes Absolute: 0.7 10*3/uL (ref 0.1–1.0)
Monocytes Relative: 12 % (ref 3–12)
Neutro Abs: 2.8 10*3/uL (ref 1.7–7.7)
Neutrophils Relative %: 45 % (ref 43–77)
Platelets: 256 10*3/uL (ref 150–400)
RBC: 4.71 MIL/uL (ref 4.22–5.81)
RDW: 15.2 % (ref 11.5–15.5)
WBC: 6.2 10*3/uL (ref 4.0–10.5)

## 2015-08-29 LAB — LIPID PANEL
Cholesterol: 163 mg/dL (ref 125–200)
HDL: 47 mg/dL (ref >=40)
LDL Cholesterol: 96 mg/dL (ref <130)
Total CHOL/HDL Ratio: 3.5 Ratio (ref <=5.0)
Triglycerides: 99 mg/dL (ref <150)
VLDL: 20 mg/dL (ref <30)

## 2015-08-30 ENCOUNTER — Telehealth: Payer: Self-pay | Admitting: *Deleted

## 2015-08-30 NOTE — Telephone Encounter (Signed)
Verified name and date of birth and gave normal lab results to patient.

## 2015-08-30 NOTE — Telephone Encounter (Signed)
-----   Message from Jaclyn ShaggyEnobong Amao, MD sent at 08/30/2015  8:18 AM EST ----- Please inform the patient that labs are normal. Thank you.

## 2015-09-06 ENCOUNTER — Telehealth: Payer: Self-pay | Admitting: Family Medicine

## 2015-09-06 NOTE — Telephone Encounter (Signed)
Patient came in and stated that the FMLA Paperwork completed by the doctor last month, did not have the doctors signature. Please follow up.

## 2015-09-06 NOTE — Telephone Encounter (Signed)
Done and faxed

## 2015-09-30 ENCOUNTER — Encounter: Payer: Self-pay | Admitting: Family Medicine

## 2015-09-30 ENCOUNTER — Ambulatory Visit: Payer: 59 | Attending: Family Medicine | Admitting: Family Medicine

## 2015-09-30 VITALS — BP 148/91 | HR 83 | Temp 98.5°F | Resp 18 | Ht 69.0 in | Wt 350.0 lb

## 2015-09-30 DIAGNOSIS — R109 Unspecified abdominal pain: Secondary | ICD-10-CM | POA: Diagnosis not present

## 2015-09-30 DIAGNOSIS — E669 Obesity, unspecified: Secondary | ICD-10-CM | POA: Diagnosis not present

## 2015-09-30 DIAGNOSIS — K921 Melena: Secondary | ICD-10-CM

## 2015-09-30 DIAGNOSIS — I10 Essential (primary) hypertension: Secondary | ICD-10-CM

## 2015-09-30 DIAGNOSIS — E119 Type 2 diabetes mellitus without complications: Secondary | ICD-10-CM | POA: Diagnosis not present

## 2015-09-30 DIAGNOSIS — R1084 Generalized abdominal pain: Secondary | ICD-10-CM

## 2015-09-30 DIAGNOSIS — E1165 Type 2 diabetes mellitus with hyperglycemia: Secondary | ICD-10-CM | POA: Insufficient documentation

## 2015-09-30 DIAGNOSIS — E1122 Type 2 diabetes mellitus with diabetic chronic kidney disease: Secondary | ICD-10-CM | POA: Insufficient documentation

## 2015-09-30 DIAGNOSIS — N182 Chronic kidney disease, stage 2 (mild): Secondary | ICD-10-CM | POA: Insufficient documentation

## 2015-09-30 LAB — GLUCOSE, POCT (MANUAL RESULT ENTRY): POC Glucose: 195 mg/dl — AB (ref 70–99)

## 2015-09-30 MED ORDER — LISINOPRIL 5 MG PO TABS: 5.0000 mg | ORAL_TABLET | Freq: Every day | ORAL | Status: DC

## 2015-09-30 MED ORDER — CANAGLIFLOZIN 100 MG PO TABS: 100.0000 mg | ORAL_TABLET | Freq: Every day | ORAL | Status: DC

## 2015-09-30 MED FILL — LISINOPRIL 5 MG TABLET: 5 | 30 days supply | Qty: 30 | Fill #0

## 2015-09-30 NOTE — Progress Notes (Signed)
Subjective:    Patient ID: William Moss, male    DOB: 08-09-1986, 30 y.o.   MRN: [id number]  HPI 30 year old male with a history of newly diagnosed type 2 diabetes mellitus (A1c 11.5) obesity, hypertension who comes into the clinic for a follow-up visit.  At his last office visit he had complained of abdominal cramping and bloating and he also had episodes of hematochezia. Hematochezia was thought to be secondary to hemorrhoids for which he received treatment however he states he still has bright red blood in his stools which comes out mixed with his stool. He continues to have abdominal bloating and cramping and pain symptoms to penetrate. He is abdomen to his back. Last hemoglobin was 12.9 month ago. He denies dizziness or fatigue; he has intermittent diarrhea but no constipation and has no nausea or vomiting.  His blood pressure is elevated and he endorses running out of his lisinopril.  Past Medical History  Diagnosis Date  . Hypertension   . Diabetes mellitus without complication (HCC)     History reviewed. No pertinent past surgical history.  No Known Allergies  Current Outpatient Prescriptions on File Prior to Visit  Medication Sig Dispense Refill  . Blood Glucose Monitoring Suppl (TRUE METRIX METER) DEVI 1 each by Does not apply route 3 (three) times daily before meals. 1 Device 0  . glipiZIDE (GLUCOTROL) 10 MG tablet Take 1 tablet (10 mg total) by mouth 2 (two) times daily before a meal. 60 tablet 3  . glucose blood (TRUE METRIX BLOOD GLUCOSE TEST) test strip 3 times daily before meals 100 each 12  . TRUEPLUS LANCETS 28G MISC 1 each by Does not apply route 3 (three) times daily before meals. 100 each 12  . hydrocortisone (ANUSOL-HC) 25 MG suppository Place 1 suppository (25 mg total) rectally 2 (two) times daily. (Patient not taking: Reported on 09/30/2015) 12 suppository 0  . Multiple Vitamins-Minerals (MULTIVITAMIN GUMMIES ADULT PO) Take 1 tablet by mouth daily. Reported on  09/30/2015     No current facility-administered medications on file prior to visit.      Review of Systems  Constitutional: Negative for activity change and appetite change.  HENT: Negative for sinus pressure and sore throat.   Eyes: Negative for visual disturbance.  Respiratory: Negative for cough, chest tightness and shortness of breath.   Cardiovascular: Negative for chest pain and leg swelling.  Gastrointestinal: Positive for abdominal pain and blood in stool. Negative for diarrhea, constipation and abdominal distention.  Endocrine: Negative.   Genitourinary: Negative for dysuria.  Musculoskeletal: Negative for myalgias and joint swelling.  Skin: Negative for rash.  Allergic/Immunologic: Negative.   Neurological: Negative for weakness, light-headedness and numbness.  Psychiatric/Behavioral: Negative for suicidal ideas and dysphoric mood.       Objective: Filed Vitals:   09/30/15 1210  BP: 148/91  Pulse: 83  Temp: 98.5 F (36.9 C)  TempSrc: Oral  Resp: 18  Height:  (1.753 m)  Weight: 350 lb (158.759 kg)  SpO2: 99%      Physical Exam Constitutional: He is oriented to person, place, and time. He appears well-developed and well-nourished.  Morbidly obese  Cardiovascular: Normal rate, normal heart sounds and intact distal pulses.   No murmur heard. Pulmonary/Chest: Effort normal and breath sounds normal. He has no wheezes. He has no rales. He exhibits no tenderness.  Abdominal: Soft. Bowel sounds are normal. He exhibits no distension and no mass. There is no tenderness.  Musculoskeletal: Normal range of motion.  Neurological:  He is alert and oriented to person, place, and time.     CBC Latest Ref Rng 08/29/2015 07/09/2015  WBC 4.0 - 10.5 K/uL 6.2 -  Hemoglobin 13.0 - 17.0 g/dL 16.1 09.6  Hematocrit 04.5 - 52.0 % 39.2 48.0  Platelets 150 - 400 K/uL 256 -    Lab Results  Component Value Date   HGBA1C 11.50 07/29/2015       Assessment & Plan:  Type 2  diabetes mellitus: Uncontrolled with A1c of 11.5, CBG of 195 which is a random blood sugar  sugars are improving. Discontinuing metformin due to abdominal cramping and I have replaced this with him for Invokana Continue glipizide I will see him back at his next office visit in the event that he has insurance issues that could prevent him from obtaining Invokana. He continues to resist initiation of insulin.  Up-to-date on Microalbumin, Pneumovax today , foot exam today , has been advised to schedule an annual eye exam with an optometrist or ophthalmologist.  Hypertension:  Blood pressure is elevated. He is to take lisinopril dose this morning; if still elevated at his next visit I will increase his dose of lisinopril.  We'll hold off on making any regimen changes and he has been advised to comply with his medications, low-sodium diet, lifestyle modification , DASH diet.  Hematochezia: Last CBC was normal. Treated for hemorrhoids recently Continues to complain of abdominal cramping and significant amount of blood and so I would need him to be evaluated for inflammatory bowel disease.  Obesity: Discussed reducing portion sizes, increasing physical activity and an exercise regimen.  This note has been created with Education officer, environmental. Any transcriptional errors are unintentional.

## 2015-09-30 NOTE — Progress Notes (Signed)
Patient here today for DM follow up and Diarrhea.  Patient reports sugar has been normal for the most part. Patient takes metformin and glipizide.   Patient has been having stomach pain for past 2 weeks, starts at top of stomach and spreads to back, hurts and then goes away, usually happens around night time. Currently patient describes as discomfort in lower stomach, at level 2.  Patient needs refill for glipizide and lisinopril. Patient ran out of lisinopril Saturday.

## 2015-10-01 ENCOUNTER — Other Ambulatory Visit: Payer: Self-pay | Admitting: Family Medicine

## 2015-10-01 DIAGNOSIS — E119 Type 2 diabetes mellitus without complications: Secondary | ICD-10-CM

## 2015-10-01 MED ORDER — EMPAGLIFLOZIN 10 MG PO TABS: 10.0000 mg | ORAL_TABLET | Freq: Every day | ORAL | Status: DC

## 2015-10-01 MED FILL — JARDIANCE 10 MG TABLET: 10 | 30 days supply | Qty: 30 | Fill #0

## 2015-10-01 MED FILL — glipiZIDE 10 MG TABS: 10 | 30 days supply | Qty: 60 | Fill #1

## 2015-10-01 NOTE — Progress Notes (Signed)
Switching from Invokana to [place] as the former is not covered by patient's insurance

## 2015-10-21 ENCOUNTER — Other Ambulatory Visit: Payer: Self-pay | Admitting: Family Medicine

## 2015-10-21 ENCOUNTER — Encounter: Payer: Self-pay | Admitting: Family Medicine

## 2015-10-21 ENCOUNTER — Ambulatory Visit: Payer: 59 | Attending: Family Medicine | Admitting: Family Medicine

## 2015-10-21 VITALS — BP 151/98 | HR 79 | Temp 98.9°F | Resp 15 | Ht 69.0 in | Wt 342.6 lb

## 2015-10-21 DIAGNOSIS — Z7984 Long term (current) use of oral hypoglycemic drugs: Secondary | ICD-10-CM | POA: Diagnosis not present

## 2015-10-21 DIAGNOSIS — E1165 Type 2 diabetes mellitus with hyperglycemia: Secondary | ICD-10-CM | POA: Insufficient documentation

## 2015-10-21 DIAGNOSIS — K921 Melena: Secondary | ICD-10-CM | POA: Insufficient documentation

## 2015-10-21 DIAGNOSIS — I10 Essential (primary) hypertension: Secondary | ICD-10-CM

## 2015-10-21 DIAGNOSIS — E119 Type 2 diabetes mellitus without complications: Secondary | ICD-10-CM | POA: Diagnosis not present

## 2015-10-21 DIAGNOSIS — R109 Unspecified abdominal pain: Secondary | ICD-10-CM | POA: Insufficient documentation

## 2015-10-21 DIAGNOSIS — E118 Type 2 diabetes mellitus with unspecified complications: Secondary | ICD-10-CM | POA: Diagnosis not present

## 2015-10-21 DIAGNOSIS — Z79899 Other long term (current) drug therapy: Secondary | ICD-10-CM | POA: Diagnosis not present

## 2015-10-21 LAB — GLUCOSE, POCT (MANUAL RESULT ENTRY): POC Glucose: 143 mg/dl — AB (ref 70–99)

## 2015-10-21 NOTE — Patient Instructions (Signed)
Diabetes Mellitus and Food It is important for you to manage your blood sugar (glucose) level. Your blood glucose level can be greatly affected by what you eat. Eating healthier foods in the appropriate amounts throughout the day at about the same time each day will help you control your blood glucose level. It can also help slow or prevent worsening of your diabetes mellitus. Healthy eating may even help you improve the level of your blood pressure and reach or maintain a healthy weight.  General recommendations for healthful eating and cooking habits include:  Eating meals and snacks regularly. Avoid going long periods of time without eating to lose weight.  Eating a diet that consists mainly of plant-based foods, such as fruits, vegetables, nuts, legumes, and whole grains.  Using low-heat cooking methods, such as baking, instead of high-heat cooking methods, such as deep frying. Work with your dietitian to make sure you understand how to use the Nutrition Facts information on food labels. HOW CAN FOOD AFFECT ME? Carbohydrates Carbohydrates affect your blood glucose level more than any other type of food. Your dietitian will help you determine how many carbohydrates to eat at each meal and teach you how to count carbohydrates. Counting carbohydrates is important to keep your blood glucose at a healthy level, especially if you are using insulin or taking certain medicines for diabetes mellitus. Alcohol Alcohol can cause sudden decreases in blood glucose (hypoglycemia), especially if you use insulin or take certain medicines for diabetes mellitus. Hypoglycemia can be a life-threatening condition. Symptoms of hypoglycemia (sleepiness, dizziness, and disorientation) are similar to symptoms of having too much alcohol.  If your health care provider has given you approval to drink alcohol, do so in moderation and use the following guidelines:  Women should not have more than one drink per day, and men  should not have more than two drinks per day. One drink is equal to:  12 oz of beer.  5 oz of wine.  1 oz of hard liquor.  Do not drink on an empty stomach.  Keep yourself hydrated. Have water, diet soda, or unsweetened iced tea.  Regular soda, juice, and other mixers might contain a lot of carbohydrates and should be counted. WHAT FOODS ARE NOT RECOMMENDED? As you make food choices, it is important to remember that all foods are not the same. Some foods have fewer nutrients per serving than other foods, even though they might have the same number of calories or carbohydrates. It is difficult to get your body what it needs when you eat foods with fewer nutrients. Examples of foods that you should avoid that are high in calories and carbohydrates but low in nutrients include:  Trans fats (most processed foods list trans fats on the Nutrition Facts label).  Regular soda.  Juice.  Candy.  Sweets, such as cake, pie, doughnuts, and cookies.  Fried foods. WHAT FOODS CAN I EAT? Eat nutrient-rich foods, which will nourish your body and keep you healthy. The food you should eat also will depend on several factors, including:  The calories you need.  The medicines you take.  Your weight.  Your blood glucose level.  Your blood pressure level.  Your cholesterol level. You should eat a variety of foods, including:  Protein.  Lean cuts of meat.  Proteins low in saturated fats, such as fish, egg whites, and beans. Avoid processed meats.  Fruits and vegetables.  Fruits and vegetables that may help control blood glucose levels, such as apples, mangoes, and   yams.  Dairy products.  Choose fat-free or low-fat dairy products, such as milk, yogurt, and cheese.  Grains, bread, pasta, and rice.  Choose whole grain products, such as multigrain bread, whole oats, and brown rice. These foods may help control blood pressure.  Fats.  Foods containing healthful fats, such as nuts,  avocado, olive oil, canola oil, and fish. DOES EVERYONE WITH DIABETES MELLITUS HAVE THE SAME MEAL PLAN? Because every person with diabetes mellitus is different, there is not one meal plan that works for everyone. It is very important that you meet with a dietitian who will help you create a meal plan that is just right for you.   This information is not intended to replace advice given to you by your health care provider. Make sure you discuss any questions you have with your health care provider.   Document Released: 05/14/2005 Document Revised: 09/07/2014 Document Reviewed: 07/14/2013 Elsevier Interactive Patient Education 2016 [employer]  

## 2015-10-21 NOTE — Progress Notes (Signed)
Patient here for follow up Reports no pain States he did not go to gastro because he got a letter saying he needed to apply for the orange care Patient states he has insurance now and would like to be referred again Did not take his meds this am

## 2015-10-21 NOTE — Progress Notes (Signed)
Subjective:    Patient ID: William Moss, male    DOB: 1986/01/04, 30 y.o.   MRN: [id number]  HPI 30 year old male with a history of hypertension, type 2 diabetes mellitus (A1c 11.5 in 07/2015), morbid obesity who comes in for follow-up of his diabetes mellitus. At his last office visit metformin was discontinued due to abdominal cramping and he reports improvement in symptoms; this was replaced with invokana, which was not covered by his insurance and so it was switched to [place] which he has been compliant with and reports random blood sugars are less than 200.  He had complained of hematochezia and was treated for hemorrhoids but symptoms still persist and he denies being constipated; he was referred to GI but states he he got a letter indicating he had to apply for the [facility] discount/Orange card but he does have medical coverage.  His blood pressure is elevated and he states he forgot to take his antihypertensive as he was in a hurry to get to the clinic today.  Past Medical History  Diagnosis Date  . Hypertension   . Diabetes mellitus without complication (HCC)     History reviewed. No pertinent past surgical history.  Social History   Social History  . Marital Status: Single    Spouse Name: N/A  . Number of Children: N/A  . Years of Education: N/A   Occupational History  . Not on file.   Social History Main Topics  . Smoking status: Never Smoker   . Smokeless tobacco: Never Used  . Alcohol Use: No  . Drug Use: No  . Sexual Activity: Not on file   Other Topics Concern  . Not on file   Social History Narrative    No Known Allergies  Current Outpatient Prescriptions on File Prior to Visit  Medication Sig Dispense Refill  . Blood Glucose Monitoring Suppl (TRUE METRIX METER) DEVI 1 each by Does not apply route 3 (three) times daily before meals. 1 Device 0  . empagliflozin (JARDIANCE) 10 MG TABS tablet Take 10 mg by mouth daily. 30 tablet 2  . glipiZIDE  (GLUCOTROL) 10 MG tablet Take 1 tablet (10 mg total) by mouth 2 (two) times daily before a meal. 60 tablet 3  . glucose blood (TRUE METRIX BLOOD GLUCOSE TEST) test strip 3 times daily before meals 100 each 12  . lisinopril (PRINIVIL,ZESTRIL) 5 MG tablet Take 1 tablet (5 mg total) by mouth daily. 30 tablet 3  . Multiple Vitamins-Minerals (MULTIVITAMIN GUMMIES ADULT PO) Take 1 tablet by mouth daily. Reported on 09/30/2015    . TRUEPLUS LANCETS 28G MISC 1 each by Does not apply route 3 (three) times daily before meals. 100 each 12   No current facility-administered medications on file prior to visit.     Review of Systems Constitutional: Negative for activity change and appetite change.  HENT: Negative for sinus pressure and sore throat.   Eyes: Negative for visual disturbance.  Respiratory: Negative for cough, chest tightness and shortness of breath.   Cardiovascular: Negative for chest pain and leg swelling.  Gastrointestinal: Positive for blood in stool. Negative for abdominal pain, diarrhea, constipation and abdominal distention.  Endocrine: Negative.   Genitourinary: Negative for dysuria.  Musculoskeletal: Negative for myalgias and joint swelling.  Skin: Negative for rash.  Allergic/Immunologic: Negative.   Neurological: Negative for weakness, light-headedness and numbness.  Psychiatric/Behavioral: Negative for suicidal ideas and dysphoric mood.      Objective: Filed Vitals:   10/21/15 1030  BP:  151/98  Pulse: 79  Temp: 98.9 F (37.2 C)  Resp: 15  Height:  (1.753 m)  Weight: 342 lb 9.6 oz (155.402 kg)  SpO2: 94%      Physical Exam Constitutional: He is oriented to person, place, and time. He appears well-developed and well-nourished.  Morbidly obese  Cardiovascular: Normal rate, normal heart sounds and intact distal pulses.   No murmur heard. Pulmonary/Chest: Effort normal and breath sounds normal. He has no wheezes. He has no rales. He exhibits no tenderness.    Abdominal: Soft. Bowel sounds are normal. He exhibits no distension and no mass. There is no tenderness.  Musculoskeletal: Normal range of motion.  Neurological: He is alert and oriented to person, place, and time.     Lab Results  Component Value Date   HGBA1C 11.50 07/29/2015    CMP Latest Ref Rng 08/29/2015 07/09/2015  Glucose 65 - 99 mg/dL 89 098(J)  BUN 7 - 25 mg/dL 14 18  Creatinine 1.91 - 1.35 mg/dL 4.78 2.95  Sodium 621 - 146 mmol/L 142 137  Potassium 3.5 - 5.3 mmol/L 4.3 4.1  Chloride 98 - 110 mmol/L 108 102  CO2 20 - 31 mmol/L 22 -  Calcium 8.6 - 10.3 mg/dL 9.2 -  Total Protein 6.1 - 8.1 g/dL 6.9 -  Total Bilirubin 0.2 - 1.2 mg/dL 0.3 -  Alkaline Phos 40 - 115 U/L 45 -  AST 10 - 40 U/L 15 -  ALT 9 - 46 U/L 29 -        Assessment & Plan:  Type 2 diabetes mellitus: Uncontrolled with A1c of 11.5, CBG of 143 which is fasting  sugars are improving. Continue glipizide and Jardiance  Up-to-date on Microalbumin, Pneumovax , foot exam today, has been advised to schedule an annual eye exam with an optometrist or ophthalmologist.  Hypertension:  Blood pressure is elevated. He is to take lisinopril dose this morning; if still elevated at his next visit I will increase his dose of lisinopril.  We'll hold off on making any regimen changes and he has been advised to comply with his medications, low-sodium diet, lifestyle modification , DASH diet.  Hematochezia: Last CBC was normal. Treated for hemorrhoids recently Was referred to GI at his last visit but there was some confusion regarding his medical coverage; I have sent a message to the referral coordinator to rectify this issue. Obesity: Discussed reducing portion sizes, increasing physical activity and an exercise regimen.  This note has been created with Education officer, environmental. Any transcriptional errors are unintentional.

## 2015-10-22 ENCOUNTER — Encounter: Payer: Self-pay | Admitting: Internal Medicine

## 2015-10-25 ENCOUNTER — Encounter: Payer: Self-pay | Admitting: Clinical

## 2015-10-25 NOTE — Progress Notes (Signed)
Depression screen [facility] 2/9 10/21/2015 09/30/2015 08/28/2015 07/29/2015  Decreased Interest 0 2 0 0  Down, Depressed, Hopeless 0 2 0 0  PHQ - 2 Score 0 4 0 0  Altered sleeping - 3 - -  Tired, decreased energy - 1 - -  Change in appetite - 2 - -  Feeling bad or failure about yourself  - 2 - -  Trouble concentrating - 0 - -  Moving slowly or fidgety/restless - 0 - -  Suicidal thoughts - 0 - -  PHQ-9 Score - 12 - -    GAD 7 : Generalized Anxiety Score 09/30/2015  Nervous, Anxious, on Edge 0  Control/stop worrying 2  Worry too much - different things 1  Trouble relaxing 2  Restless 1  Easily annoyed or irritable 1  Afraid - awful might happen 2  Total GAD 7 Score 9

## 2015-10-29 MED FILL — LISINOPRIL 5 MG TABLET: 5 | 30 days supply | Qty: 30 | Fill #1

## 2015-10-29 MED FILL — glipiZIDE 10 MG TABS: 10 | 30 days supply | Qty: 60 | Fill #2

## 2015-10-29 MED FILL — JARDIANCE 10 MG TABLET: 10 | 30 days supply | Qty: 30 | Fill #1

## 2015-11-25 MED FILL — LISINOPRIL 5 MG TABLET: 5 | 30 days supply | Qty: 30 | Fill #2

## 2015-12-11 ENCOUNTER — Ambulatory Visit (INDEPENDENT_AMBULATORY_CARE_PROVIDER_SITE_OTHER): Payer: 59 | Admitting: Internal Medicine

## 2015-12-11 ENCOUNTER — Encounter: Payer: Self-pay | Admitting: Internal Medicine

## 2015-12-11 VITALS — BP 164/104 | HR 80 | Ht 67.25 in | Wt 349.1 lb

## 2015-12-11 DIAGNOSIS — E119 Type 2 diabetes mellitus without complications: Secondary | ICD-10-CM

## 2015-12-11 DIAGNOSIS — K625 Hemorrhage of anus and rectum: Secondary | ICD-10-CM | POA: Diagnosis not present

## 2015-12-11 DIAGNOSIS — R1013 Epigastric pain: Secondary | ICD-10-CM

## 2015-12-11 MED ORDER — NA SULFATE-K SULFATE-MG SULF 17.5-3.13-1.6 GM/177ML PO SOLN: 1.0000 | Freq: Once | ORAL | Status: DC

## 2015-12-11 NOTE — Patient Instructions (Signed)
You have been scheduled for a colonoscopy at [facility].  Please follow written instructions given to you at your visit today.  Please pick up your prep supplies at the pharmacy within the next 1-3 days. If you use inhalers (even only as needed), please bring them with you on the day of your procedure.  

## 2015-12-11 NOTE — Progress Notes (Signed)
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 Venetia NightAmao with a chief complaint of rectal bleeding, transient abdominal pain, and change in bowel habits. Patient reports developing rectal bleeding in late December 2016. He noticed red blood in the toilet bowl as well as blood associated with the stool. There is no associated rectal pain. He was evaluated. Review of outside records reveals CBC from 08/29/2015 to be unremarkable with hemoglobin 13.9. In addition, comprehensive metabolic panel was normal. Last hemoglobin A1c markedly elevated at 11.5 in November. Around that same time the patient was having epigastric discomfort. This was associated with metformin. As well regular but somewhat soft bowel movements. With time the epigastric discomfort has resolved without recurrence. None for 6 weeks. Last episode of rectal bleeding 2 weeks ago. There is no family history of colon cancer. No weight loss. No prior GI evaluations.  REVIEW OF SYSTEMS:  All non-GI ROS negative except for  Past Medical History  Diagnosis Date  . Hypertension   . Diabetes mellitus without complication (HCC)   . Hemorrhoids     ?    History reviewed. No pertinent past surgical history.  Social History William NieceLance Withington  reports that he has never smoked. He has never used smokeless tobacco. He reports that he does not drink alcohol or use illicit drugs.  family history includes Alzheimer's disease in his paternal grandmother; Breast cancer in his cousin; Cancer in his maternal grandmother; Diabetes in his father and mother.  No Known Allergies     PHYSICAL EXAMINATION: Vital signs: BP 164/104 mmHg  Pulse 80  Ht 5' 7.25" (1.708 m)  Wt 349 lb 2 oz (158.362 kg)  BMI 54.28 kg/m2  Constitutional: Markedly obese but otherwise well-appearing, no acute distress Psychiatric: alert and oriented x3, cooperative Eyes: extraocular movements  intact, anicteric, conjunctiva pink Mouth: oral pharynx moist, no lesions Neck: supple but somewhat thick Lymph: no lymphadenopathy Cardiovascular: heart regular rate and rhythm, no murmur Lungs: clear to auscultation bilaterally Abdomen: soft, markedly obese, nontender, nondistended, no obvious ascites, no peritoneal signs, normal bowel sounds, no organomegaly Rectal: Deferred until colonoscopy Extremities: no clubbing cyanosis or lower extremity edema bilaterally Skin: no lesions on visible extremities Neuro: No focal deficits. Normal DTRs. No asterixis.  ASSESSMENT:  #1. Rectal bleeding. Etiology unclear. Rule out benign anorectal pathology. Rule out neoplasia #2. Transient problems with epigastric pain. Possibly related to metformin #3. Minor change in bowel habits as described. Possibly related to metformin. Rule out intrinsic colonic pathology #4. Diabetes mellitus #5. Morbid obesity  PLAN:  #1. Schedule colonoscopy. The patient is high risk given his body habitus with BMI greater than 54. We will perform at the hospital with anesthesia supervising sedation.The nature of the procedure, as well as the risks, benefits, and alternatives were carefully and thoroughly reviewed with the patient. Ample time for discussion and questions allowed. The patient understood, was satisfied, and agreed to proceed. #2. Hold diabetic medications the day of the procedure #3. Weight loss   A copy of this dictation has been sent to Dr. Venetia NightAmao

## 2015-12-16 ENCOUNTER — Other Ambulatory Visit: Payer: Self-pay

## 2015-12-16 ENCOUNTER — Telehealth: Payer: Self-pay

## 2015-12-16 DIAGNOSIS — K921 Melena: Secondary | ICD-10-CM

## 2015-12-16 NOTE — Telephone Encounter (Signed)
error 

## 2016-01-09 ENCOUNTER — Encounter: Payer: Self-pay | Admitting: Family Medicine

## 2016-01-09 ENCOUNTER — Ambulatory Visit: Payer: 59 | Attending: Family Medicine | Admitting: Family Medicine

## 2016-01-09 DIAGNOSIS — Z6841 Body Mass Index (BMI) 40.0 and over, adult: Secondary | ICD-10-CM | POA: Insufficient documentation

## 2016-01-09 DIAGNOSIS — Z79899 Other long term (current) drug therapy: Secondary | ICD-10-CM | POA: Insufficient documentation

## 2016-01-09 DIAGNOSIS — I10 Essential (primary) hypertension: Secondary | ICD-10-CM | POA: Diagnosis not present

## 2016-01-09 DIAGNOSIS — E669 Obesity, unspecified: Secondary | ICD-10-CM | POA: Diagnosis not present

## 2016-01-09 DIAGNOSIS — E119 Type 2 diabetes mellitus without complications: Secondary | ICD-10-CM | POA: Diagnosis not present

## 2016-01-09 DIAGNOSIS — Z7984 Long term (current) use of oral hypoglycemic drugs: Secondary | ICD-10-CM | POA: Diagnosis not present

## 2016-01-09 LAB — COMPLETE METABOLIC PANEL WITH GFR
ALT: 18 U/L (ref 9–46)
AST: 15 U/L (ref 10–40)
Albumin: 4.3 g/dL (ref 3.6–5.1)
Alkaline Phosphatase: 55 U/L (ref 40–115)
BUN: 10 mg/dL (ref 7–25)
CO2: 24 mmol/L (ref 20–31)
Calcium: 9.5 mg/dL (ref 8.6–10.3)
Chloride: 107 mmol/L (ref 98–110)
Creat: 0.92 mg/dL (ref 0.60–1.35)
GFR, Est African American: >89 mL/min (ref >=60)
GFR, Est Non African American: >89 mL/min (ref >=60)
Glucose, Bld: 81 mg/dL (ref 65–99)
Potassium: 4.1 mmol/L (ref 3.5–5.3)
Sodium: 140 mmol/L (ref 135–146)
Total Bilirubin: 0.4 mg/dL (ref 0.2–1.2)
Total Protein: 7.5 g/dL (ref 6.1–8.1)

## 2016-01-09 LAB — HEMOGLOBIN A1C
Hgb A1c MFr Bld: 6.4 % — ABNORMAL HIGH (ref <5.7)
Mean Plasma Glucose: 137 mg/dL

## 2016-01-09 MED ORDER — METFORMIN HCL 500 MG PO TABS: 500.0000 mg | ORAL_TABLET | Freq: Two times a day (BID) | ORAL | Status: DC

## 2016-01-09 MED ORDER — LISINOPRIL 5 MG PO TABS: 5.0000 mg | ORAL_TABLET | Freq: Every day | ORAL | Status: DC

## 2016-01-09 MED ORDER — GLIPIZIDE 10 MG PO TABS: 10.0000 mg | ORAL_TABLET | Freq: Two times a day (BID) | ORAL | Status: DC

## 2016-01-09 MED ORDER — EMPAGLIFLOZIN 10 MG PO TABS: 10.0000 mg | ORAL_TABLET | Freq: Every day | ORAL | Status: DC

## 2016-01-09 NOTE — Patient Instructions (Signed)
Diabetes Mellitus and Food It is important for you to manage your blood sugar (glucose) level. Your blood glucose level can be greatly affected by what you eat. Eating healthier foods in the appropriate amounts throughout the day at about the same time each day will help you control your blood glucose level. It can also help slow or prevent worsening of your diabetes mellitus. Healthy eating may even help you improve the level of your blood pressure and reach or maintain a healthy weight.  General recommendations for healthful eating and cooking habits include:  Eating meals and snacks regularly. Avoid going long periods of time without eating to lose weight.  Eating a diet that consists mainly of plant-based foods, such as fruits, vegetables, nuts, legumes, and whole grains.  Using low-heat cooking methods, such as baking, instead of high-heat cooking methods, such as deep frying. Work with your dietitian to make sure you understand how to use the Nutrition Facts information on food labels. HOW CAN FOOD AFFECT ME? Carbohydrates Carbohydrates affect your blood glucose level more than any other type of food. Your dietitian will help you determine how many carbohydrates to eat at each meal and teach you how to count carbohydrates. Counting carbohydrates is important to keep your blood glucose at a healthy level, especially if you are using insulin or taking certain medicines for diabetes mellitus. Alcohol Alcohol can cause sudden decreases in blood glucose (hypoglycemia), especially if you use insulin or take certain medicines for diabetes mellitus. Hypoglycemia can be a life-threatening condition. Symptoms of hypoglycemia (sleepiness, dizziness, and disorientation) are similar to symptoms of having too much alcohol.  If your health care provider has given you approval to drink alcohol, do so in moderation and use the following guidelines:  Women should not have more than one drink per day, and men  should not have more than two drinks per day. One drink is equal to:  12 oz of beer.  5 oz of wine.  1 oz of hard liquor.  Do not drink on an empty stomach.  Keep yourself hydrated. Have water, diet soda, or unsweetened iced tea.  Regular soda, juice, and other mixers might contain a lot of carbohydrates and should be counted. WHAT FOODS ARE NOT RECOMMENDED? As you make food choices, it is important to remember that all foods are not the same. Some foods have fewer nutrients per serving than other foods, even though they might have the same number of calories or carbohydrates. It is difficult to get your body what it needs when you eat foods with fewer nutrients. Examples of foods that you should avoid that are high in calories and carbohydrates but low in nutrients include:  Trans fats (most processed foods list trans fats on the Nutrition Facts label).  Regular soda.  Juice.  Candy.  Sweets, such as cake, pie, doughnuts, and cookies.  Fried foods. WHAT FOODS CAN I EAT? Eat nutrient-rich foods, which will nourish your body and keep you healthy. The food you should eat also will depend on several factors, including:  The calories you need.  The medicines you take.  Your weight.  Your blood glucose level.  Your blood pressure level.  Your cholesterol level. You should eat a variety of foods, including:  Protein.  Lean cuts of meat.  Proteins low in saturated fats, such as fish, egg whites, and beans. Avoid processed meats.  Fruits and vegetables.  Fruits and vegetables that may help control blood glucose levels, such as apples, mangoes, and   yams.  Dairy products.  Choose fat-free or low-fat dairy products, such as milk, yogurt, and cheese.  Grains, bread, pasta, and rice.  Choose whole grain products, such as multigrain bread, whole oats, and brown rice. These foods may help control blood pressure.  Fats.  Foods containing healthful fats, such as nuts,  avocado, olive oil, canola oil, and fish. DOES EVERYONE WITH DIABETES MELLITUS HAVE THE SAME MEAL PLAN? Because every person with diabetes mellitus is different, there is not one meal plan that works for everyone. It is very important that you meet with a dietitian who will help you create a meal plan that is just right for you.   This information is not intended to replace advice given to you by your health care provider. Make sure you discuss any questions you have with your health care provider.   Document Released: 05/14/2005 Document Revised: 09/07/2014 Document Reviewed: 07/14/2013 Elsevier Interactive Patient Education 2016 [employer]  

## 2016-01-09 NOTE — Progress Notes (Signed)
 Subjective:  Patient ID: William Moss, male    DOB: 06/19/1986  Age: 29 y.o. MRN: [id number]  CC: Diabetes and Follow-up   HPI William Moss is a 29-year-old male with a history of hypertension, type 2 diabetes mellitus (A1c 11.5 in 07/2015), morbid obesity who comes in for follow-up of his diabetes mellitus. He has been out for his medications for the last 1 month and has not been compliant with checking his blood sugars and has not been exercising as well and also maintains dietary indiscretion. He has not recently been seen by an ophthalmologist.  He previously had hematochezia which has resolved however he is being seen by GI and scheduled for colonoscopy next month. He has no complaints today.  Outpatient Prescriptions Prior to Visit  Medication Sig Dispense Refill  . Blood Glucose Monitoring Suppl (TRUE METRIX METER) DEVI 1 each by Does not apply route 3 (three) times daily before meals. 1 Device 0  . glucose blood (TRUE METRIX BLOOD GLUCOSE TEST) test strip 3 times daily before meals 100 each 12  . Multiple Vitamins-Minerals (MULTIVITAMIN GUMMIES ADULT PO) Take 1 tablet by mouth daily. Reported on 09/30/2015    . TRUEPLUS LANCETS 28G MISC 1 each by Does not apply route 3 (three) times daily before meals. 100 each 12  . lisinopril (PRINIVIL,ZESTRIL) 5 MG tablet Take 1 tablet (5 mg total) by mouth daily. 30 tablet 3  . metFORMIN (GLUCOPHAGE) 500 MG tablet Take 500 mg by mouth 2 (two) times daily with a meal.    . Na Sulfate-K Sulfate-Mg Sulf SOLN Take 1 kit by mouth once. (Patient not taking: Reported on 01/09/2016) 354 mL 0  . empagliflozin (JARDIANCE) 10 MG TABS tablet Take 10 mg by mouth daily. (Patient not taking: Reported on 12/11/2015) 30 tablet 2  . glipiZIDE (GLUCOTROL) 10 MG tablet Take 1 tablet (10 mg total) by mouth 2 (two) times daily before a meal. (Patient not taking: Reported on 01/09/2016) 60 tablet 3   No facility-administered medications prior to visit.    ROS Review  of Systems Constitutional: Negative for activity change and appetite change.  HENT: Negative for sinus pressure and sore throat.   Eyes: Negative for visual disturbance.  Respiratory: Negative for cough, chest tightness and shortness of breath.   Cardiovascular: Negative for chest pain and leg swelling.  Gastrointestinal: Negative for abdominal pain, diarrhea, constipation and abdominal distention.  Endocrine: Negative.   Genitourinary: Negative for dysuria.  Musculoskeletal: Negative for myalgias and joint swelling.  Skin: Negative for rash.  Allergic/Immunologic: Negative.   Neurological: Negative for weakness, light-headedness and numbness.  Psychiatric/Behavioral: Negative for suicidal ideas and dysphoric mood.   Objective:  BP 146/94 mmHg  Pulse 70  Temp(Src) 98.6 F (37 C) (Oral)  Resp 16  Ht 5' 9" (1.753 m)  Wt 341 lb 3.2 oz (154.767 kg)  BMI 50.36 kg/m2  SpO2 97%  BP/Weight 01/09/2016 12/11/2015 10/21/2015  Systolic BP 146 164 151  Diastolic BP 94 104 98  Wt. (Lbs) 341.2 349.13 342.6  BMI 50.36 54.28 50.57      Physical Exam Constitutional: He is oriented to person, place, and time. He appears well-developed and well-nourished.  Morbidly obese  Cardiovascular: Normal rate, normal heart sounds and intact distal pulses.   No murmur heard. Pulmonary/Chest: Effort normal and breath sounds normal. He has no wheezes. He has no rales. He exhibits no tenderness.  Abdominal: Soft. Bowel sounds are normal. He exhibits no distension and no mass. There is no tenderness.    Musculoskeletal: Normal range of motion.  Neurological: He is alert and oriented to person, place, and time.     Assessment & Plan:   1. Type 2 diabetes mellitus without complication, without long-term current use of insulin (HCC) Uncontrolled with A1c of 11.5 from 07/2015 A1c today; likely to be uncontrolled as he has been out of medications for the last 1 month. Compliance emphasized. Will review Blood  sugar log at next office visit. Advised to schedule annual eye exam - Glucose (CBG) - Microalbumin/Creatinine Ratio, Urine - glipiZIDE (GLUCOTROL) 10 MG tablet; Take 1 tablet (10 mg total) by mouth 2 (two) times daily before a meal.  Dispense: 60 tablet; Refill: 3 - empagliflozin (JARDIANCE) 10 MG TABS tablet; Take 10 mg by mouth daily.  Dispense: 30 tablet; Refill: 3 - metFORMIN (GLUCOPHAGE) 500 MG tablet; Take 1 tablet (500 mg total) by mouth 2 (two) times daily with a meal.  Dispense: 60 tablet; Refill: 3 - Hemoglobin A1c - COMPLETE METABOLIC PANEL WITH GFR  2. Essential hypertension Uncontrolled due to running out of medications - lisinopril (PRINIVIL,ZESTRIL) 5 MG tablet; Take 1 tablet (5 mg total) by mouth daily.  Dispense: 30 tablet; Refill: 3  3. Obesity Discussed weight loss options, reducing portion sizes and increasing physical activity. Exercise at least 30 minutes at least on 2 days of the week when he is off He has a goal to lose 5 pounds the next one month.  Meds ordered this encounter  Medications  . glipiZIDE (GLUCOTROL) 10 MG tablet    Sig: Take 1 tablet (10 mg total) by mouth 2 (two) times daily before a meal.    Dispense:  60 tablet    Refill:  3  . empagliflozin (JARDIANCE) 10 MG TABS tablet    Sig: Take 10 mg by mouth daily.    Dispense:  30 tablet    Refill:  3    Discontinue Invokana  . metFORMIN (GLUCOPHAGE) 500 MG tablet    Sig: Take 1 tablet (500 mg total) by mouth 2 (two) times daily with a meal.    Dispense:  60 tablet    Refill:  3  . lisinopril (PRINIVIL,ZESTRIL) 5 MG tablet    Sig: Take 1 tablet (5 mg total) by mouth daily.    Dispense:  30 tablet    Refill:  3    Follow-up: Return in about 1 month (around 02/09/2016) for Follow-up on diabetes mellitus.   Enobong Amao MD    

## 2016-01-09 NOTE — Progress Notes (Signed)
Patient's here for f/up DM.   Patient reports feeling good today. Patient denies any pain today.  Patient requesting med refill. Patient ran out of all meds yesterday.

## 2016-01-10 LAB — MICROALBUMIN / CREATININE URINE RATIO
Creatinine, Urine: 361 mg/dL (ref 20–370)
Microalb Creat Ratio: 15 mcg/mg creat (ref <30)
Microalb, Ur: 5.3 mg/dL

## 2016-01-13 ENCOUNTER — Telehealth: Payer: Self-pay

## 2016-01-13 NOTE — Telephone Encounter (Signed)
Placed call to patient, patient didn't answer. Message was left for the patient to return my call.

## 2016-01-13 NOTE — Telephone Encounter (Signed)
-----   Message from Jaclyn ShaggyEnobong Amao, MD sent at 01/10/2016  2:04 PM EDT ----- A1c revealed significant improvement from 11.5 five months ago down to 6.4. He is doing an excellent job with his diabetes and should keep it up.

## 2016-01-20 NOTE — Telephone Encounter (Signed)
Place call to patient, patient did not answer. Message was left for the patient to return my call. 

## 2016-01-28 ENCOUNTER — Encounter (HOSPITAL_COMMUNITY): Payer: Self-pay | Admitting: *Deleted

## 2016-01-28 NOTE — Progress Notes (Signed)
Left message on voicemail for William Moss , assistant for Dr. Marina GoodellPerry informing her that we have left numerous messages for patient to call us back to go over medical history and give him instructions and no return call from patient.

## 2016-02-04 ENCOUNTER — Ambulatory Visit (HOSPITAL_COMMUNITY)
Admission: RE | Admit: 2016-02-04 | Discharge: 2016-02-04 | Disposition: A | Discharge status: Home / Self Care | Payer: 59 | Source: Ambulatory Visit | Attending: Internal Medicine | Admitting: Internal Medicine

## 2016-02-04 ENCOUNTER — Ambulatory Visit (HOSPITAL_COMMUNITY): Payer: 59 | Admitting: Registered Nurse

## 2016-02-04 ENCOUNTER — Encounter (HOSPITAL_COMMUNITY)
Admission: RE | Disposition: A | Discharge status: Home / Self Care | Payer: Self-pay | Source: Ambulatory Visit | Attending: Internal Medicine

## 2016-02-04 ENCOUNTER — Encounter (HOSPITAL_COMMUNITY): Payer: Self-pay

## 2016-02-04 DIAGNOSIS — K648 Other hemorrhoids: Secondary | ICD-10-CM | POA: Diagnosis not present

## 2016-02-04 DIAGNOSIS — E119 Type 2 diabetes mellitus without complications: Secondary | ICD-10-CM | POA: Insufficient documentation

## 2016-02-04 DIAGNOSIS — I10 Essential (primary) hypertension: Secondary | ICD-10-CM | POA: Diagnosis not present

## 2016-02-04 DIAGNOSIS — Z6841 Body Mass Index (BMI) 40.0 and over, adult: Secondary | ICD-10-CM | POA: Diagnosis not present

## 2016-02-04 DIAGNOSIS — K625 Hemorrhage of anus and rectum: Secondary | ICD-10-CM | POA: Insufficient documentation

## 2016-02-04 HISTORY — PX: COLONOSCOPY: SHX5424

## 2016-02-04 LAB — GLUCOSE, CAPILLARY: Glucose-Capillary: 95 mg/dL (ref 65–99)

## 2016-02-04 SURGERY — COLONOSCOPY
Anesthesia: Monitor Anesthesia Care

## 2016-02-04 MED ORDER — LABETALOL HCL 5 MG/ML IV SOLN
INTRAVENOUS | Status: DC | PRN
  Administered 2016-02-04: 5 mg via INTRAVENOUS

## 2016-02-04 MED ORDER — PROPOFOL 500 MG/50ML IV EMUL
INTRAVENOUS | Status: DC | PRN
  Administered 2016-02-04: 200 ug/kg/min via INTRAVENOUS

## 2016-02-04 MED ORDER — LACTATED RINGERS IV SOLN
INTRAVENOUS | Status: DC
Start: 2016-02-04 — End: 2016-02-04
  Administered 2016-02-04: 10:00:00 via INTRAVENOUS

## 2016-02-04 MED ORDER — PROPOFOL 10 MG/ML IV BOLUS
INTRAVENOUS | Status: AC
  Filled 2016-02-04: qty 60

## 2016-02-04 NOTE — Interval H&P Note (Signed)
History and Physical Interval Note:  02/04/2016 11:50 AM  William Moss  has presented today for surgery, with the diagnosis of rectal bleeding  The various methods of treatment have been discussed with the patient and family. After consideration of risks, benefits and other options for treatment, the patient has consented to  Procedure(s): COLONOSCOPY (N/A) as a surgical intervention .  The patient's history has been reviewed, patient examined, no change in status, stable for surgery.  I have reviewed the patient's chart and labs.  Questions were answered to the patient's satisfaction.     Yancey FlemingsJohn Rehman Levinson

## 2016-02-04 NOTE — Anesthesia Preprocedure Evaluation (Signed)
Anesthesia Evaluation  Patient identified by MRN, date of birth, ID band Patient awake    Reviewed: Allergy & Precautions, NPO status   Airway Mallampati: II  TM Distance: >3 FB Neck ROM: Full    Dental   Pulmonary neg pulmonary ROS,    breath sounds clear to auscultation       Cardiovascular hypertension,  Rhythm:Regular Rate:Normal     Neuro/Psych    GI/Hepatic negative GI ROS, Neg liver ROS,   Endo/Other  diabetes  Renal/GU negative Renal ROS     Musculoskeletal   Abdominal   Peds  Hematology   Anesthesia Other Findings   Reproductive/Obstetrics                             Anesthesia Physical Anesthesia Plan  ASA: III  Anesthesia Plan: MAC   Post-op Pain Management:    Induction: Intravenous  Airway Management Planned: Simple Face Mask  Additional Equipment:   Intra-op Plan:   Post-operative Plan:   Informed Consent: I have reviewed the patients History and Physical, chart, labs and discussed the procedure including the risks, benefits and alternatives for the proposed anesthesia with the patient or authorized representative who has indicated his/her understanding and acceptance.   Dental advisory given  Plan Discussed with: CRNA and Anesthesiologist  Anesthesia Plan Comments:         Anesthesia Quick Evaluation

## 2016-02-04 NOTE — Op Note (Signed)
[facility] Patient Name: William NieceLance Bergsma Procedure Date: 02/04/2016 MRN: [id number] Attending MD: Wilhemina BonitoJohn N. Marina GoodellPerry , MD Date of Birth: 1986-07-04 CSN: [id number] Age: [age] Admit Type: Outpatient Procedure:                Colonoscopy Indications:              Rectal bleeding Providers:                Wilhemina BonitoJohn N. Marina GoodellPerry, MD, Waynard EdwardsMegan Oliver, RN, Clearnce SorrelKatie Smith,                            Technician, Anastasio ChampionJanet Evans, CRNA Referring MD:             Jaclyn ShaggyEnobong Amao, MD Medicines:                Monitored Anesthesia Care Complications:            No immediate complications. Estimated blood loss:                            None. Estimated Blood Loss:     Estimated blood loss: none. Procedure:                Pre-Anesthesia Assessment:                           - Prior to the procedure, a History and Physical                            was performed, and patient medications and                            allergies were reviewed. The patient's tolerance of                            previous anesthesia was also reviewed. The risks                            and benefits of the procedure and the sedation                            options and risks were discussed with the patient.                            All questions were answered, and informed consent                            was obtained. Prior Anticoagulants: The patient has                            taken no previous anticoagulant or antiplatelet                            agents. ASA Grade Assessment: II - A patient with  mild systemic disease. After reviewing the risks                            and benefits, the patient was deemed in                            satisfactory condition to undergo the procedure.                           After obtaining informed consent, the colonoscope                            was passed under direct vision. Throughout the                            procedure, the patient's blood  pressure, pulse, and                            oxygen saturations were monitored continuously. The                            EC-[id number] ([id number]) scope was introduced through                            the anus and advanced to the the cecum, identified                            by appendiceal orifice and ileocecal valve. The                            ileocecal valve, appendiceal orifice, and rectum                            were photographed. The quality of the bowel                            preparation was excellent. The colonoscopy was                            performed without difficulty. The patient tolerated                            the procedure well. The bowel preparation used was                            SUPREP. Scope In: 11:26:15 AM Scope Out: 11:36:03 AM Scope Withdrawal Time: 0 hours 7 minutes 17 seconds  Total Procedure Duration: 0 hours 9 minutes 48 seconds  Findings:      Internal hemorrhoids were found during retroflexion.      The exam was otherwise without abnormality on direct and retroflexion       views. Impression:               - Internal hemorrhoids.                           -  The examination was otherwise normal on direct                            and retroflexion views.                           - Moderate Sedation:      none Recommendation:           - Repeat colonoscopy age 40 years for screening                            purposes.                           - High-fiber diet.                           - Weight loss.                           - Return to the care of your primary provider. Procedure Code(s):        --- Professional ---                           [phone], Colonoscopy, flexible; diagnostic, including                            collection of specimen(s) by brushing or washing,                            when performed (separate procedure) Diagnosis Code(s):        --- Professional ---                           K64.8, Other  hemorrhoids                           K62.5, Hemorrhage of anus and rectum CPT copyright 2016 [employer]. All rights reserved. The codes documented in this report are preliminary and upon coder review may  be revised to meet current compliance requirements. Wilhemina Bonito. Marina Goodell, MD 02/04/2016 11:49:06 AM This report has been signed electronically. Number of Addenda: 0

## 2016-02-04 NOTE — Consult Note (Signed)
  HISTORY OF PRESENT ILLNESS:  William Moss is a 29 y.o. male with diabetes seen recently in the office for rectal bleeding. Now for colonoscopy. No interval issues  REVIEW OF SYSTEMS:  All non-GI ROS negative except for  Past Medical History  Diagnosis Date  . Hypertension   . Diabetes mellitus without complication (HCC)   . Hemorrhoids     ?    Past Surgical History  Procedure Laterality Date  . No past surgeries      Social History William Moss  reports that he has never smoked. He has never used smokeless tobacco. He reports that he does not drink alcohol or use illicit drugs.  family history includes Alzheimer's disease in his paternal grandmother; Breast cancer in his cousin; Cancer in his maternal grandmother; Diabetes in his father and mother.  No Known Allergies     PHYSICAL EXAMINATION: Vital signs: BP 142/87 mmHg  Pulse 84  Temp(Src) 99.5 F (37.5 C) (Oral)  Resp 16  Ht 5' 9" (1.753 m)  Wt 341 lb (154.677 kg)  BMI 50.33 kg/m2  SpO2 94%  Constitutional:Obese, generally well-appearing, no acute distress Psychiatric: alert and oriented x3, cooperative Eyes: extraocular movements intact, anicteric, conjunctiva pink Mouth: oral pharynx moist, no lesions Neck: supple no lymphadenopathy Cardiovascular: heart regular rate and rhythm, no murmur Lungs: clear to auscultation bilaterally Abdomen:Obese, soft, nontender, nondistended, no obvious ascites, no peritoneal signs, normal bowel sounds, no organomegaly Rectal:See colonoscopy report Extremities: no lower extremity edema bilaterally Skin: no lesions on visible extremities Neuro: No focal deficits. No asterixis.    ASSESSMENT:  1. Rectal bleeding 2. Morbid obesity   PLAN:  1. Colonoscopy.The nature of the procedure, as well as the risks, benefits, and alternatives were carefully and thoroughly reviewed with the patient. Ample time for discussion and questions allowed. The patient understood, was  satisfied, and agreed to proceed.       

## 2016-02-04 NOTE — H&P (View-Only) (Signed)
  HISTORY OF PRESENT ILLNESS:  William Moss is a 30 y.o. male with diabetes seen recently in the office for rectal bleeding. Now for colonoscopy. No interval issues  REVIEW OF SYSTEMS:  All non-GI ROS negative except for  Past Medical History  Diagnosis Date  . Hypertension   . Diabetes mellitus without complication (HCC)   . Hemorrhoids     ?    Past Surgical History  Procedure Laterality Date  . No past surgeries      Social History William NieceLance Gambino  reports that he has never smoked. He has never used smokeless tobacco. He reports that he does not drink alcohol or use illicit drugs.  family history includes Alzheimer's disease in his paternal grandmother; Breast cancer in his cousin; Cancer in his maternal grandmother; Diabetes in his father and mother.  No Known Allergies     PHYSICAL EXAMINATION: Vital signs: BP 142/87 mmHg  Pulse 84  Temp(Src) 99.5 F (37.5 C) (Oral)  Resp 16  Ht 5\' 9"  (1.753 m)  Wt 341 lb (154.677 kg)  BMI 50.33 kg/m2  SpO2 94%  Constitutional:Obese, generally well-appearing, no acute distress Psychiatric: alert and oriented x3, cooperative Eyes: extraocular movements intact, anicteric, conjunctiva pink Mouth: oral pharynx moist, no lesions Neck: supple no lymphadenopathy Cardiovascular: heart regular rate and rhythm, no murmur Lungs: clear to auscultation bilaterally Abdomen:Obese, soft, nontender, nondistended, no obvious ascites, no peritoneal signs, normal bowel sounds, no organomegaly Rectal:See colonoscopy report Extremities: no lower extremity edema bilaterally Skin: no lesions on visible extremities Neuro: No focal deficits. No asterixis.    ASSESSMENT:  1. Rectal bleeding 2. Morbid obesity   PLAN:  1. Colonoscopy.The nature of the procedure, as well as the risks, benefits, and alternatives were carefully and thoroughly reviewed with the patient. Ample time for discussion and questions allowed. The patient understood, was  satisfied, and agreed to proceed.

## 2016-02-04 NOTE — Anesthesia Postprocedure Evaluation (Signed)
Anesthesia Post Note  Patient: William Moss  Procedure(s) Performed: Procedure(s) (LRB): COLONOSCOPY (N/A)  Patient location during evaluation: PACU Anesthesia Type: MAC Level of consciousness: awake Pain management: pain level controlled Vital Signs Assessment: post-procedure vital signs reviewed and stable Respiratory status: spontaneous breathing Cardiovascular status: stable Anesthetic complications: no    Last Vitals:  Filed Vitals:   02/04/16 1015  BP: 142/87  Pulse: 84  Temp: 37.5 C  Resp: 16    Last Pain: There were no vitals filed for this visit.               EDWARDS,Jaycen Vercher

## 2016-02-04 NOTE — Transfer of Care (Signed)
Immediate Anesthesia Transfer of Care Note  Patient: William Moss  Procedure(s) Performed: Procedure(s): COLONOSCOPY (N/A)  Patient Location: PACU  Anesthesia Type:MAC  Level of Consciousness: awake, alert , oriented and patient cooperative  Airway & Oxygen Therapy: Patient Spontanous Breathing and Patient connected to face mask oxygen  Post-op Assessment: Report given to RN, Post -op Vital signs reviewed and stable and Patient moving all extremities X 4  Post vital signs: stable  Last Vitals:  Filed Vitals:   02/04/16 1015  BP: 142/87  Pulse: 84  Temp: 37.5 C  Resp: 16    Last Pain: There were no vitals filed for this visit.       Complications: No apparent anesthesia complications

## 2016-02-04 NOTE — Discharge Instructions (Signed)
Colonoscopy, Care After °Refer to this sheet in the next few weeks. These instructions provide you with information on caring for yourself after your procedure. Your health care provider may also give you more specific instructions. Your treatment has been planned according to current medical practices, but problems sometimes occur. Call your health care provider if you have any problems or questions after your procedure. °WHAT TO EXPECT AFTER THE PROCEDURE  °After your procedure, it is typical to have the following: °· A small amount of blood in your stool. °· Moderate amounts of gas and mild abdominal cramping or bloating. °HOME CARE INSTRUCTIONS °· Do not drive, operate machinery, or sign important documents for 24 hours. °· You may shower and resume your regular physical activities, but move at a slower pace for the first 24 hours. °· Take frequent rest periods for the first 24 hours. °· Walk around or put a warm pack on your abdomen to help reduce abdominal cramping and bloating. °· Drink enough fluids to keep your urine clear or pale yellow. °· You may resume your normal diet as instructed by your health care provider. Avoid heavy or fried foods that are hard to digest. °· Avoid drinking alcohol for 24 hours or as instructed by your health care provider. °· Only take over-the-counter or prescription medicines as directed by your health care provider. °· If a tissue sample (biopsy) was taken during your procedure: °¨ Do not take aspirin or blood thinners for 7 days, or as instructed by your health care provider. °¨ Do not drink alcohol for 7 days, or as instructed by your health care provider. °¨ Eat soft foods for the first 24 hours. °SEEK MEDICAL CARE IF: °You have persistent spotting of blood in your stool 2-3 days after the procedure. °SEEK IMMEDIATE MEDICAL CARE IF: °· You have more than a small spotting of blood in your stool. °· You pass large blood clots in your stool. °· Your abdomen is swollen  (distended). °· You have nausea or vomiting. °· You have a fever. °· You have increasing abdominal pain that is not relieved with medicine. °  °This information is not intended to replace advice given to you by your health care provider. Make sure you discuss any questions you have with your health care provider. °  °Document Released: 03/31/2004 Document Revised: 06/07/2013 Document Reviewed: 04/24/2013 °Elsevier Interactive Patient Education ©2016 [employer] ° °

## 2016-02-06 ENCOUNTER — Encounter (HOSPITAL_COMMUNITY): Payer: Self-pay | Admitting: Internal Medicine

## 2016-03-18 ENCOUNTER — Emergency Department (HOSPITAL_COMMUNITY)
Admission: EM | Admit: 2016-03-18 | Discharge: 2016-03-18 | Disposition: A | Discharge status: Home / Self Care | Payer: 59 | Attending: Emergency Medicine | Admitting: Emergency Medicine

## 2016-03-18 ENCOUNTER — Encounter (HOSPITAL_COMMUNITY): Payer: Self-pay | Admitting: Emergency Medicine

## 2016-03-18 ENCOUNTER — Emergency Department (HOSPITAL_COMMUNITY): Payer: 59

## 2016-03-18 DIAGNOSIS — R0789 Other chest pain: Secondary | ICD-10-CM | POA: Diagnosis present

## 2016-03-18 DIAGNOSIS — E119 Type 2 diabetes mellitus without complications: Secondary | ICD-10-CM | POA: Insufficient documentation

## 2016-03-18 DIAGNOSIS — R9431 Abnormal electrocardiogram [ECG] [EKG]: Secondary | ICD-10-CM | POA: Diagnosis not present

## 2016-03-18 DIAGNOSIS — I10 Essential (primary) hypertension: Secondary | ICD-10-CM

## 2016-03-18 DIAGNOSIS — Z7984 Long term (current) use of oral hypoglycemic drugs: Secondary | ICD-10-CM | POA: Insufficient documentation

## 2016-03-18 DIAGNOSIS — Z79899 Other long term (current) drug therapy: Secondary | ICD-10-CM | POA: Insufficient documentation

## 2016-03-18 LAB — RAPID URINE DRUG SCREEN, HOSP PERFORMED
Amphetamines: NOT DETECTED
Barbiturates: NOT DETECTED
Benzodiazepines: NOT DETECTED
Cocaine: NOT DETECTED
Opiates: NOT DETECTED
Tetrahydrocannabinol: NOT DETECTED

## 2016-03-18 LAB — CBC
HCT: 39.6 % (ref 39.0–52.0)
Hemoglobin: 13.3 g/dL (ref 13.0–17.0)
MCH: 27.7 pg (ref 26.0–34.0)
MCHC: 33.6 g/dL (ref 30.0–36.0)
MCV: 82.3 fL (ref 78.0–100.0)
Platelets: 256 10*3/uL (ref 150–400)
RBC: 4.81 MIL/uL (ref 4.22–5.81)
RDW: 14.3 % (ref 11.5–15.5)
WBC: 7.8 10*3/uL (ref 4.0–10.5)

## 2016-03-18 LAB — BASIC METABOLIC PANEL
Anion gap: 6 (ref 5–15)
BUN: 16 mg/dL (ref 6–20)
CO2: 25 mmol/L (ref 22–32)
Calcium: 9.3 mg/dL (ref 8.9–10.3)
Chloride: 107 mmol/L (ref 101–111)
Creatinine, Ser: 0.95 mg/dL (ref 0.61–1.24)
GFR calc Af Amer: >60 mL/min (ref >60)
GFR calc non Af Amer: >60 mL/min (ref >60)
Glucose, Bld: 109 mg/dL — ABNORMAL HIGH (ref 65–99)
Potassium: 3.8 mmol/L (ref 3.5–5.1)
Sodium: 138 mmol/L (ref 135–145)

## 2016-03-18 LAB — I-STAT TROPONIN, ED
Troponin i, poc: 0 ng/mL (ref 0.00–0.08)
Troponin i, poc: 0 ng/mL (ref 0.00–0.08)

## 2016-03-18 MED ORDER — ASPIRIN 81 MG PO CHEW
324.0000 mg | CHEWABLE_TABLET | Freq: Once | ORAL | Status: AC
  Administered 2016-03-18: 324 mg via ORAL
  Filled 2016-03-18: qty 4

## 2016-03-18 MED ORDER — NITROGLYCERIN 0.4 MG SL SUBL: 0.4000 mg | SUBLINGUAL_TABLET | SUBLINGUAL | Status: DC | PRN

## 2016-03-18 MED ORDER — LISINOPRIL 5 MG PO TABS
5.0000 mg | ORAL_TABLET | Freq: Every day | ORAL | Status: DC
  Administered 2016-03-18: 5 mg via ORAL
  Filled 2016-03-18 (×2): qty 1

## 2016-03-18 MED ORDER — LISINOPRIL 5 MG PO TABS: 5.0000 mg | ORAL_TABLET | Freq: Every day | ORAL | Status: DC

## 2016-03-18 NOTE — ED Notes (Signed)
Per patient, he has chest pain on the right side that radiates to his right arm.  He has had this pain in the past, last occurrence was 03-04-16.  Denies any falls, trauma, or injuries.  He states the pain is a tense aching pain not a stabbing pain.

## 2016-03-18 NOTE — ED Notes (Signed)
Bed: RESA Expected date:  Expected time:  Means of arrival:  Comments: Triage 3-per EDP Erma HeritageIsaacs

## 2016-03-18 NOTE — ED Provider Notes (Signed)
CSN: [id number]     Arrival date & time 03/18/16  1621 History   First MD Initiated Contact with Patient 03/18/16 1658     Chief Complaint  Patient presents with  . Chest Pain  . Fatigue     (Consider location/radiation/quality/duration/timing/severity/associated sxs/prior Treatment) HPI   William Moss is a(n) 30 y.o. male who presents to the ED with cc of cp. He  has a past medical history of Hypertension; Diabetes mellitus without complication (HCC); and Hemorrhoids. He is aslo morbidly obese. [facility] Dr. Venetia NightAmao. The patient states that he has been having intermittent  Aching cp and tightness on the R side with radiation to the R arm. Non-exertional. He has associated SOB at rest. Not worsened with Movement or palpation. The patient states that he spilled his lisinopril in his bag and has been out of his medication for the past 5 days. He was unable to get a refill from the pharmacy and has not contacted his pcp for a refill. The patient has been having this pain intermittently over the past month but today was his worst episode. He denies nausea, vomiting or diaphoresis. He does not smoke.     Past Medical History  Diagnosis Date  . Hypertension   . Diabetes mellitus without complication (HCC)   . Hemorrhoids     ?   Past Surgical History  Procedure Laterality Date  . No past surgeries    . Colonoscopy N/A 02/04/2016    Procedure: COLONOSCOPY;  Surgeon: Hilarie FredricksonJohn N Perry, MD;  Location: [facility];  Service: Endoscopy;  Laterality: N/A;   Family History  Problem Relation Age of Onset  . Diabetes Mother   . Diabetes Father   . Cancer Maternal Grandmother   . Breast cancer Cousin   . Alzheimer's disease Paternal Grandmother    Social History  Substance Use Topics  . Smoking status: Never Smoker   . Smokeless tobacco: Never Used  . Alcohol Use: No    Review of Systems  Ten systems reviewed and are negative for acute change, except as noted in the HPI.     Allergies  Review of patient's allergies indicates no known allergies.  Home Medications   Prior to Admission medications   Medication Sig Start Date End Date Taking? Authorizing Provider  Blood Glucose Monitoring Suppl (TRUE METRIX METER) DEVI 1 each by Does not apply route 3 (three) times daily before meals. 07/29/15  Yes Jaclyn ShaggyEnobong Amao, MD  empagliflozin (JARDIANCE) 10 MG TABS tablet Take 10 mg by mouth daily. 01/09/16  Yes Jaclyn ShaggyEnobong Amao, MD  glipiZIDE (GLUCOTROL) 10 MG tablet Take 1 tablet (10 mg total) by mouth 2 (two) times daily before a meal. 01/09/16  Yes Jaclyn ShaggyEnobong Amao, MD  glucose blood (TRUE METRIX BLOOD GLUCOSE TEST) test strip 3 times daily before meals 07/29/15  Yes Jaclyn ShaggyEnobong Amao, MD  lisinopril (PRINIVIL,ZESTRIL) 5 MG tablet Take 1 tablet (5 mg total) by mouth daily. 01/09/16  Yes Jaclyn ShaggyEnobong Amao, MD  metFORMIN (GLUCOPHAGE) 500 MG tablet Take 1 tablet (500 mg total) by mouth 2 (two) times daily with a meal. 01/09/16  Yes Jaclyn ShaggyEnobong Amao, MD  Multiple Vitamins-Minerals (MULTIVITAMIN GUMMIES ADULT PO) Take 1 tablet by mouth daily.    Yes Historical Provider, MD  TRUEPLUS LANCETS 28G MISC 1 each by Does not apply route 3 (three) times daily before meals. 07/29/15  Yes Enobong Amao, MD   BP 168/116 mmHg  Pulse 87  Temp(Src) 99.2 F (37.3 C) (Oral)  Resp 22  Ht  (1.702 m)  Wt 154.223 kg  BMI 53.24 kg/m2  SpO2 96% Physical Exam  Constitutional: He appears well-developed and well-nourished. No distress.  HENT:  Head: Normocephalic and atraumatic.  Eyes: Conjunctivae are normal. No scleral icterus.  Neck: Normal range of motion. Neck supple.  Cardiovascular: Normal rate, regular rhythm and normal heart sounds.   Pulmonary/Chest: Effort normal and breath sounds normal. No respiratory distress.  Abdominal: Soft. There is no tenderness.  Musculoskeletal: He exhibits no edema.  Neurological: He is alert.  Skin: Skin is warm and dry. He is not diaphoretic.  Psychiatric: His  behavior is normal.  Nursing note and vitals reviewed.   ED Course  Procedures (including critical care time) Labs Review Labs Reviewed  BASIC METABOLIC PANEL - Abnormal; Notable for the following:    Glucose, Bld 109 (*)    All other components within normal limits  CBC  URINE RAPID DRUG SCREEN, HOSP PERFORMED  I-STAT TROPOININ, ED  Rosezena Sensor, ED    Imaging Review Dg Chest 2 View  03/18/2016  CLINICAL DATA:  Right-sided chest pain radiating into right upper extremity. Hypertension. EXAM: CHEST  2 VIEW COMPARISON:  October 28, 2014 FINDINGS: There is no edema or consolidation. The heart is upper normal in size with pulmonary vascularity within normal limits. No adenopathy. No pneumothorax. No bone lesions. Spina bifida occulta is noted incidentally in the lower cervical spine region. IMPRESSION: No edema or consolidation. Electronically Signed   By: Bretta Bang III M.D.   On: 03/18/2016 17:21   I have personally reviewed and evaluated these images and lab results as part of my medical decision-making.   EKG Interpretation   Date/Time:  Wednesday March 18 2016 16:34:46 EDT Ventricular Rate:  92 PR Interval:    QRS Duration: 109 QT Interval:  325 QTC Calculation: 402 R Axis:   59 Text Interpretation:  Sinus rhythm Nonspecific T abnormalities, inferior  leads new flipped t waves in lateral leads Otherwise no significant change  Confirmed by FLOYD MD, Reuel Boom ([id number]) on 03/18/2016 4:59:15 PM Also  confirmed by Adela Lank MD, DANIEL [phone]), editor Whitney Post, Cala Bradford [phone])  on  03/18/2016 5:11:18 PM      MDM   Final diagnoses:  Essential hypertension  Abnormal EKG    BP 175/120 mmHg  Pulse 89  Temp(Src) 99.2 F (37.3 C) (Oral)  Resp 17  Ht  (1.702 m)  Wt 154.223 kg  BMI 53.24 kg/m2  SpO2 96% Patient with hypertension and EKGy changes.  2 negative troponins and HEART score is 3. Patient will be discharged with his antihypertensive medications. I will  refill his lisinopril. The patient will be discharged to follow up with pcp/ cardiology. Discussed return precautions  Patient is to be discharged with recommendation to follow up with PCP in regards to today's hospital visit. Chest pain is not likely of cardiac or pulmonary etiology d/t presentation, perc negative, VSS, no tracheal deviation, no JVD or new murmur, RRR, breath sounds equal bilaterally, , negative troponin, and negative CXR. Pt has been advised to return to the ED if CP becomes exertional, associated with diaphoresis or nausea, radiates to left jaw/arm, worsens or becomes concerning in any way. Pt appears reliable for follow up and is agreeable to discharge.   Case has been discussed with and seen by Dr. Denton Lank who agrees with the above plan to discharge.     Arthor Captain, PA-C 03/18/16 2153   Cathren Laine, MD 03/24/16 [phone]

## 2016-03-18 NOTE — Discharge Instructions (Signed)
DASH Eating Plan °DASH stands for "Dietary Approaches to Stop Hypertension." The DASH eating plan is a healthy eating plan that has been shown to reduce high blood pressure (hypertension). Additional health benefits may include reducing the risk of type 2 diabetes mellitus, heart disease, and stroke. The DASH eating plan may also help with weight loss. °WHAT DO I NEED TO KNOW ABOUT THE DASH EATING PLAN? °For the DASH eating plan, you will follow these general guidelines: °· Choose foods with a percent daily value for sodium of less than 5% (as listed on the food label). °· Use salt-free seasonings or herbs instead of table salt or sea salt. °· Check with your health care provider or pharmacist before using salt substitutes. °· Eat lower-sodium products, often labeled as "lower sodium" or "no salt added." °· Eat fresh foods. °· Eat more vegetables, fruits, and low-fat dairy products. °· Choose whole grains. Look for the word "whole" as the first word in the ingredient list. °· Choose fish and skinless chicken or turkey more often than red meat. Limit fish, poultry, and meat to 6 oz (170 g) each day. °· Limit sweets, desserts, sugars, and sugary drinks. °· Choose heart-healthy fats. °· Limit cheese to 1 oz (28 g) per day. °· Eat more home-cooked food and less restaurant, buffet, and fast food. °· Limit fried foods. °· Cook foods using methods other than frying. °· Limit canned vegetables. If you do use them, rinse them well to decrease the sodium. °· When eating at a restaurant, ask that your food be prepared with less salt, or no salt if possible. °WHAT FOODS CAN I EAT? °Seek help from a dietitian for individual calorie needs. °Grains °Whole grain or whole wheat bread. Brown rice. Whole grain or whole wheat pasta. Quinoa, bulgur, and whole grain cereals. Low-sodium cereals. Corn or whole wheat flour tortillas. Whole grain cornbread. Whole grain crackers. Low-sodium crackers. °Vegetables °Fresh or frozen vegetables  (raw, steamed, roasted, or grilled). Low-sodium or reduced-sodium tomato and vegetable juices. Low-sodium or reduced-sodium tomato sauce and paste. Low-sodium or reduced-sodium canned vegetables.  °Fruits °All fresh, canned (in natural juice), or frozen fruits. °Meat and Other Protein Products °Ground beef (85% or leaner), grass-fed beef, or beef trimmed of fat. Skinless chicken or turkey. Ground chicken or turkey. Pork trimmed of fat. All fish and seafood. Eggs. Dried beans, peas, or lentils. Unsalted nuts and seeds. Unsalted canned beans. °Dairy °Low-fat dairy products, such as skim or 1% milk, 2% or reduced-fat cheeses, low-fat ricotta or cottage cheese, or plain low-fat yogurt. Low-sodium or reduced-sodium cheeses. °Fats and Oils °Tub margarines without trans fats. Light or reduced-fat mayonnaise and salad dressings (reduced sodium). Avocado. Safflower, olive, or canola oils. Natural peanut or almond butter. °Other °Unsalted popcorn and pretzels. °The items listed above may not be a complete list of recommended foods or beverages. Contact your dietitian for more options. °WHAT FOODS ARE NOT RECOMMENDED? °Grains °White bread. White pasta. White rice. Refined cornbread. Bagels and croissants. Crackers that contain trans fat. °Vegetables °Creamed or fried vegetables. Vegetables in a cheese sauce. Regular canned vegetables. Regular canned tomato sauce and paste. Regular tomato and vegetable juices. °Fruits °Dried fruits. Canned fruit in light or heavy syrup. Fruit juice. °Meat and Other Protein Products °Fatty cuts of meat. Ribs, chicken wings, bacon, sausage, bologna, salami, chitterlings, fatback, hot dogs, bratwurst, and packaged luncheon meats. Salted nuts and seeds. Canned beans with salt. °Dairy °Whole or 2% milk, cream, half-and-half, and cream cheese. Whole-fat or sweetened yogurt. Full-fat   cheeses or blue cheese. Nondairy creamers and whipped toppings. Processed cheese, cheese spreads, or cheese  curds. °Condiments °Onion and garlic salt, seasoned salt, table salt, and sea salt. Canned and packaged gravies. Worcestershire sauce. Tartar sauce. Barbecue sauce. Teriyaki sauce. Soy sauce, including reduced sodium. Steak sauce. Fish sauce. Oyster sauce. Cocktail sauce. Horseradish. Ketchup and mustard. Meat flavorings and tenderizers. Bouillon cubes. Hot sauce. Tabasco sauce. Marinades. Taco seasonings. Relishes. °Fats and Oils °Butter, stick margarine, lard, shortening, ghee, and bacon fat. Coconut, palm kernel, or palm oils. Regular salad dressings. °Other °Pickles and olives. Salted popcorn and pretzels. °The items listed above may not be a complete list of foods and beverages to avoid. Contact your dietitian for more information. °WHERE CAN I FIND MORE INFORMATION? °[employer], [employer]: www.nhlbi.nih.gov/health/health-topics/topics/dash/ °  °This information is not intended to replace advice given to you by your health care provider. Make sure you discuss any questions you have with your health care provider. °  °Document Released: 08/06/2011 Document Revised: 09/07/2014 Document Reviewed: 06/21/2013 °Elsevier Interactive Patient Education ©2016 [employer] Inc[employer] ° °Hypertension °Hypertension, commonly called high blood pressure, is when the force of blood pumping through your arteries is too strong. Your arteries are the blood vessels that carry blood from your heart throughout your body. A blood pressure reading consists of a higher number over a lower number, such as 110/72. The higher number (systolic) is the pressure inside your arteries when your heart pumps. The lower number (diastolic) is the pressure inside your arteries when your heart relaxes. Ideally you want your blood pressure below 120/80. °Hypertension forces your heart to work harder to pump blood. Your arteries may become narrow or stiff. Having untreated or uncontrolled hypertension can cause heart attack, stroke, kidney  disease, and other problems. °RISK FACTORS °Some risk factors for high blood pressure are controllable. Others are not.  °Risk factors you cannot control include:  °· Race. You may be at higher risk if you are African American. °· Age. Risk increases with age. °· Gender. Men are at higher risk than women before age 45 years. After age 65, women are at higher risk than men. °Risk factors you can control include: °· Not getting enough exercise or physical activity. °· Being overweight. °· Getting too much fat, sugar, calories, or salt in your diet. °· Drinking too much alcohol. °SIGNS AND SYMPTOMS °Hypertension does not usually cause signs or symptoms. Extremely high blood pressure (hypertensive crisis) may cause headache, anxiety, shortness of breath, and nosebleed. °DIAGNOSIS °To check if you have hypertension, your health care provider will measure your blood pressure while you are seated, with your arm held at the level of your heart. It should be measured at least twice using the same arm. Certain conditions can cause a difference in blood pressure between your right and left arms. A blood pressure reading that is higher than normal on one occasion does not mean that you need treatment. If it is not clear whether you have high blood pressure, you may be asked to return on a different day to have your blood pressure checked again. Or, you may be asked to monitor your blood pressure at home for 1 or more weeks. °TREATMENT °Treating high blood pressure includes making lifestyle changes and possibly taking medicine. Living a healthy lifestyle can help lower high blood pressure. You may need to change some of your habits. °Lifestyle changes may include: °· Following the DASH diet. This diet is high in fruits, vegetables, and whole   grains. It is low in salt, red meat, and added sugars. °· Keep your sodium intake below 2,300 mg per day. °· Getting at least 30-45 minutes of aerobic exercise at least 4 times per  week. °· Losing weight if necessary. °· Not smoking. °· Limiting alcoholic beverages. °· Learning ways to reduce stress. °Your health care provider may prescribe medicine if lifestyle changes are not enough to get your blood pressure under control, and if one of the following is true: °· You are 18-59 years of age and your systolic blood pressure is above 140. °· You are 60 years of age or older, and your systolic blood pressure is above 150. °· Your diastolic blood pressure is above 90. °· You have diabetes, and your systolic blood pressure is over 140 or your diastolic blood pressure is over 90. °· You have kidney disease and your blood pressure is above 140/90. °· You have heart disease and your blood pressure is above 140/90. °Your personal target blood pressure may vary depending on your medical conditions, your age, and other factors. °HOME CARE INSTRUCTIONS °· Have your blood pressure rechecked as directed by your health care provider.   °· Take medicines only as directed by your health care provider. Follow the directions carefully. Blood pressure medicines must be taken as prescribed. The medicine does not work as well when you skip doses. Skipping doses also puts you at risk for problems. °· Do not smoke.   °· Monitor your blood pressure at home as directed by your health care provider.  °SEEK MEDICAL CARE IF:  °· You think you are having a reaction to medicines taken. °· You have recurrent headaches or feel dizzy. °· You have swelling in your ankles. °· You have trouble with your vision. °SEEK IMMEDIATE MEDICAL CARE IF: °· You develop a severe headache or confusion. °· You have unusual weakness, numbness, or feel faint. °· You have severe chest or abdominal pain. °· You vomit repeatedly. °· You have trouble breathing. °MAKE SURE YOU:  °· Understand these instructions. °· Will watch your condition. °· Will get help right away if you are not doing well or get worse. °  °This information is not intended to  replace advice given to you by your health care provider. Make sure you discuss any questions you have with your health care provider. °  °Document Released: 08/17/2005 Document Revised: 01/01/2015 Document Reviewed: 06/09/2013 °Elsevier Interactive Patient Education ©2016 Elsevier Inc. ° °Managing Your High Blood Pressure °Blood pressure is a measurement of how forceful your blood is pressing against the walls of the arteries. Arteries are muscular tubes within the circulatory system. Blood pressure does not stay the same. Blood pressure rises when you are active, excited, or nervous; and it lowers during sleep and relaxation. If the numbers measuring your blood pressure stay above normal most of the time, you are at risk for health problems. High blood pressure (hypertension) is a long-term (chronic) condition in which blood pressure is elevated. °A blood pressure reading is recorded as two numbers, such as 120 over 80 (or 120/80). The first, higher number is called the systolic pressure. It is a measure of the pressure in your arteries as the heart beats. The second, lower number is called the diastolic pressure. It is a measure of the pressure in your arteries as the heart relaxes between beats.  °Keeping your blood pressure in a normal range is important to your overall health and prevention of health problems, such as heart disease and stroke.   When your blood pressure is uncontrolled, your heart has to work harder than normal. High blood pressure is a very common condition in adults because blood pressure tends to rise with age. Men and women are equally likely to have hypertension but at different times in life. Before age 45, men are more likely to have hypertension. After 32 years of age, women are more likely to have it. Hypertension is especially common in African Americans. This condition often has no signs or symptoms. The cause of the condition is usually not known. Your caregiver can help you come up  with a plan to keep your blood pressure in a normal, healthy range. °BLOOD PRESSURE STAGES °Blood pressure is classified into four stages: normal, prehypertension, stage 1, and stage 2. Your blood pressure reading will be used to determine what type of treatment, if any, is necessary. Appropriate treatment options are tied to these four stages:  °Normal °· Systolic pressure (mm Hg): below 120. °· Diastolic pressure (mm Hg): below 80. °Prehypertension °· Systolic pressure (mm Hg): 120 to 139. °· Diastolic pressure (mm Hg): 80 to 89. °Stage 1 °· Systolic pressure (mm Hg): 140 to 159. °· Diastolic pressure (mm Hg): 90 to 99. °Stage 2 °· Systolic pressure (mm Hg): 160 or above. °· Diastolic pressure (mm Hg): 100 or above. °RISKS RELATED TO HIGH BLOOD PRESSURE °Managing your blood pressure is an important responsibility. Uncontrolled high blood pressure can lead to: °· A heart attack. °· A stroke. °· A weakened blood vessel (aneurysm). °· Heart failure. °· Kidney damage. °· Eye damage. °· Metabolic syndrome. °· Memory and concentration problems. °HOW TO MANAGE YOUR BLOOD PRESSURE °Blood pressure can be managed effectively with lifestyle changes and medicines (if needed). Your caregiver will help you come up with a plan to bring your blood pressure within a normal range. Your plan should include the following: °Education °· Read all information provided by your caregivers about how to control blood pressure. °· Educate yourself on the latest guidelines and treatment recommendations. New research is always being done to further define the risks and treatments for high blood pressure. °Lifestyle changes °· Control your weight. °· Avoid smoking. °· Stay physically active. °· Reduce the amount of salt in your diet. °· Reduce stress. °· Control any chronic conditions, such as high cholesterol or diabetes. °· Reduce your alcohol intake. °Medicines °· Several medicines (antihypertensive medicines) are available, if needed, to  bring blood pressure within a normal range. °Communication °· Review all the medicines you take with your caregiver because there may be side effects or interactions. °· Talk with your caregiver about your diet, exercise habits, and other lifestyle factors that may be contributing to high blood pressure. °· See your caregiver regularly. Your caregiver can help you create and adjust your plan for managing high blood pressure. °RECOMMENDATIONS FOR TREATMENT AND FOLLOW-UP  °The following recommendations are based on current guidelines for managing high blood pressure in nonpregnant adults. Use these recommendations to identify the proper follow-up period or treatment option based on your blood pressure reading. You can discuss these options with your caregiver. °· Systolic pressure of 120 to 139 or diastolic pressure of 80 to 89: Follow up with your caregiver as directed. °· Systolic pressure of 140 to 160 or diastolic pressure of 90 to 100: Follow up with your caregiver within 2 months. °· Systolic pressure above 160 or diastolic pressure above 100: Follow up with your caregiver within 1 month. °· Systolic pressure above 180 or diastolic pressure above 110:   Consider antihypertensive therapy; follow up with your caregiver within 1 week. °· Systolic pressure above 200 or diastolic pressure above 120: Begin antihypertensive therapy; follow up with your caregiver within 1 week. °  °This information is not intended to replace advice given to you by your health care provider. Make sure you discuss any questions you have with your health care provider. °  °Document Released: 05/11/2012 Document Reviewed: 05/11/2012 °Elsevier Interactive Patient Education ©2016 Elsevier Inc. ° °

## 2016-04-10 ENCOUNTER — Telehealth: Payer: Self-pay | Admitting: Family Medicine

## 2016-04-10 NOTE — Telephone Encounter (Signed)
Pt came into facility to speak with PCP's nurse regarding documents that need to be filled out for his employer. Pt mentioned that he was in the ED 7/19 and hasn't been able to come back here for a follow up due to his work schedule. Please f/u with pt. Left all documents on PCP's mailbox.

## 2016-04-14 NOTE — Telephone Encounter (Signed)
FMLA form is ready for pick up

## 2016-04-14 NOTE — Telephone Encounter (Signed)
Writer called patient to let him know that his paperwork is ready for pick up.  Patient stated understanding and will pick it up in the morning.

## 2016-04-17 ENCOUNTER — Other Ambulatory Visit: Payer: Self-pay | Admitting: Family Medicine

## 2016-04-17 DIAGNOSIS — R0789 Other chest pain: Secondary | ICD-10-CM

## 2016-04-28 ENCOUNTER — Ambulatory Visit: Payer: 59 | Admitting: Cardiology

## 2016-04-28 ENCOUNTER — Other Ambulatory Visit: Payer: Self-pay | Admitting: Family Medicine

## 2016-04-28 DIAGNOSIS — E119 Type 2 diabetes mellitus without complications: Secondary | ICD-10-CM

## 2016-05-09 ENCOUNTER — Other Ambulatory Visit: Payer: Self-pay | Admitting: Family Medicine

## 2016-05-09 DIAGNOSIS — I10 Essential (primary) hypertension: Secondary | ICD-10-CM

## 2016-05-09 DIAGNOSIS — E119 Type 2 diabetes mellitus without complications: Secondary | ICD-10-CM

## 2016-05-11 ENCOUNTER — Other Ambulatory Visit: Payer: Self-pay | Admitting: Pharmacist

## 2016-05-11 DIAGNOSIS — E119 Type 2 diabetes mellitus without complications: Secondary | ICD-10-CM

## 2016-05-11 MED ORDER — GLIPIZIDE 10 MG PO TABS
ORAL_TABLET | ORAL | 0 refills | Status: DC
Start: 2016-05-11 — End: 2016-08-23

## 2016-05-17 ENCOUNTER — Other Ambulatory Visit: Payer: Self-pay | Admitting: Family Medicine

## 2016-05-17 DIAGNOSIS — E119 Type 2 diabetes mellitus without complications: Secondary | ICD-10-CM

## 2016-05-27 ENCOUNTER — Other Ambulatory Visit: Payer: Self-pay

## 2016-05-27 ENCOUNTER — Ambulatory Visit (INDEPENDENT_AMBULATORY_CARE_PROVIDER_SITE_OTHER): Payer: 59 | Admitting: Cardiovascular Disease

## 2016-05-27 ENCOUNTER — Encounter: Payer: Self-pay | Admitting: Cardiovascular Disease

## 2016-05-27 VITALS — BP 142/66 | HR 83 | Ht 68.0 in | Wt 366.0 lb

## 2016-05-27 DIAGNOSIS — I10 Essential (primary) hypertension: Secondary | ICD-10-CM | POA: Diagnosis not present

## 2016-05-27 DIAGNOSIS — R072 Precordial pain: Secondary | ICD-10-CM | POA: Diagnosis not present

## 2016-05-27 DIAGNOSIS — R0789 Other chest pain: Secondary | ICD-10-CM | POA: Diagnosis not present

## 2016-05-27 DIAGNOSIS — R9431 Abnormal electrocardiogram [ECG] [EKG]: Secondary | ICD-10-CM | POA: Diagnosis not present

## 2016-05-27 HISTORY — DX: Other chest pain: R07.89

## 2016-05-27 MED ORDER — LISINOPRIL 5 MG PO TABS: 5.0000 mg | ORAL_TABLET | Freq: Every day | ORAL | 3 refills | Status: DC

## 2016-05-27 NOTE — Addendum Note (Signed)
Addended by: Neoma LamingPUGH, Katiria Calame J on: 05/27/2016 10:01 AM   Modules accepted: Orders

## 2016-05-27 NOTE — Progress Notes (Signed)
  Cardiology Office Note   Date:  05/27/2016   ID:  [facility] Moss, DOB 04/20/1986, MRN [id number]  PCP:  Enobong, Amao, MD  Cardiologist:   [facility] Mandich Saxton, MD   No chief complaint on file.     History of Present Illness: [facility] Moss is a 29 y.o. male with hypertension, diabetes and morbid obesity who presents for an evaluation of chest pain.  [facility] Moss reports intermittent episodes of chest pain that is been ongoing for years. However, last July they occurred more frequently and more intense. [facility] was seen in the [facility] 03[facility] with chest pain.  At the time he ran out of his lisinopril and his BP was 168/116.  He had two sets of negative cardiac enzymes.   EKG showed sinus rhythm with non-specific t wave abnormalities.  The pain occurs approximately once per month, ever in July was occurring more frequently. The episodes last from 30-40 minutes. The pain is on the left side of his chest and radiates to the right. He reports it as a sharp, aching pain that is 7 out of 10 in severity. It typically occurs when he is sitting down and he wonders if it may be associated with stress. There is associated lightheadedness and diaphoresis but no nausea or shortness of breath.  He has not noticed any lower extremity edema, orthopnea, or PND. He does sometimes note bad taste in his mouth when he lays down or when he bends over.  He endorses belching and thinks that he may have acid reflux, though he has not taken any medication for this.   [facility] Moss does not exercise regularly. When he does workout he likes to ride a stationary bike and lift weights.  When he does work out he doesn't have chest pain.  He has been out of his lisinopril for two weeks.   Past Medical History:  Diagnosis Date  . Diabetes mellitus without complication (HCC)   . Hemorrhoids    ?  . Hypertension     Past Surgical History:  Procedure Laterality Date  . COLONOSCOPY N/A 02/04/2016   Procedure: COLONOSCOPY;  Surgeon: [facility] N  Perry, MD;  Location: [facility];  Service: Endoscopy;  Laterality: N/A;  . NO PAST SURGERIES       Current Outpatient Prescriptions  Medication Sig Dispense Refill  . Blood Glucose Monitoring Suppl (TRUE METRIX METER) DEVI 1 each by Does not apply route 3 (three) times daily before meals. 1 Device 0  . glipiZIDE (GLUCOTROL) 10 MG tablet TAKE 1 TABLET BY MOUTH TWICE A DAY BEFORE A MEAL 180 tablet 0  . glucose blood (TRUE METRIX BLOOD GLUCOSE TEST) test strip 3 times daily before meals 100 each 12  . JARDIANCE 10 MG TABS tablet TAKE 1 TABLET EVERY DAY 30 tablet 0  . lisinopril (PRINIVIL,ZESTRIL) 5 MG tablet Take 1 tablet (5 mg total) by mouth daily. 30 tablet 0  . metFORMIN (GLUCOPHAGE) 500 MG tablet TAKE 1 TABLET BY MOUTH TWICE A DAY WITH A MEAL 60 tablet 0  . Multiple Vitamins-Minerals (MULTIVITAMIN GUMMIES ADULT PO) Take 1 tablet by mouth daily.     . TRUEPLUS LANCETS 28G MISC 1 each by Does not apply route 3 (three) times daily before meals. 100 each 12   No current facility-administered medications for this visit.     Allergies:   Review of patient's allergies indicates no known allergies.    Social History:  The patient  reports that he has never smoked. He   has never used smokeless tobacco. He reports that he does not drink alcohol or use drugs.   Family History:  The patient's family history includes Alzheimer's disease in his paternal grandmother; Breast cancer in his cousin; Cancer in his maternal grandmother; Diabetes in his father and mother.    ROS:  Please see the history of present illness.   Otherwise, review of systems are positive for none.   All other systems are reviewed and negative.    PHYSICAL EXAM: VS:  BP (!) 142/66   Pulse 83   Ht 5' 8" (1.727 m)   Wt (!) 366 lb (166 kg)   BMI 55.65 kg/m  , BMI Body mass index is 55.65 kg/m. GENERAL:  Well appearing HEENT:  Pupils equal round and reactive, fundi not visualized, oral mucosa unremarkable NECK:  No  jugular venous distention, waveform within normal limits, carotid upstroke brisk and symmetric, no bruits, no thyromegaly LYMPHATICS:  No cervical adenopathy LUNGS:  Clear to auscultation bilaterally HEART:  RRR.  PMI not displaced or sustained,S1 and S2 within normal limits, no S3, no S4, no clicks, no rubs, no murmurs ABD:  Flat, positive bowel sounds normal in frequency in pitch, no bruits, no rebound, no guarding, no midline pulsatile mass, no hepatomegaly, no splenomegaly EXT:  2 plus pulses throughout, no edema, no cyanosis no clubbing SKIN:  No rashes no nodules NEURO:  Cranial nerves II through XII grossly intact, motor grossly intact throughout PSYCH:  Cognitively intact, oriented to person place and time    EKG:  EKG is ordered today. The ekg ordered today demonstrates sinus rhythm rate 83 bpm.  Non-specific t wave flattening.    Recent Labs: 01/09/2016: ALT 18 03/18/2016: BUN 16; Creatinine, Ser 0.95; Hemoglobin 13.3; Platelets 256; Potassium 3.8; Sodium 138    Lipid Panel    Component Value Date/Time   CHOL 163 08/29/2015 0905   TRIG 99 08/29/2015 0905   HDL 47 08/29/2015 0905   CHOLHDL 3.5 08/29/2015 0905   VLDL 20 08/29/2015 0905   LDLCALC 96 08/29/2015 0905      Wt Readings from Last 3 Encounters:  05/27/16 (!) 366 lb (166 kg)  03/18/16 (!) 340 lb (154.2 kg)  02/04/16 (!) 341 lb (154.7 kg)      ASSESSMENT AND PLAN:  # Chest pain: Mr. [facility] Moss has atypical chest pain that seems like it is more related to GERD than ischemia.  However, he does have risk factors including morbid obesity and poorly-treated hypertension.  We will will obtain an exercise Myoview to evaluate for ischemia. If this is negative, we will do a PPI trial.    # Hypertension:  Blood pressure is poorly-controlled.  However, he has not been taking his lisinopril. We refilled a 3 month supply of his lisinopril today.    Current medicines are reviewed at length with the patient today.  The  patient does not have concerns regarding medicines.  The following changes have been made:  no change  Labs/ tests ordered today include: No orders of the defined types were placed in this encounter.    Disposition:   FU with [facility] Moss Savitz C. Fairplay, MD, FACC in 1 month.    This note was written with the assistance of speech recognition software.  Please excuse any transcriptional errors.  Signed, Royann Wildasin C. Lynn, MD, FACC  05/27/2016 8:06 AM     [facility] 

## 2016-05-27 NOTE — Patient Instructions (Signed)
Schedule 2 day stress myoview   Your physician recommends that you schedule a follow-up appointment in: 1 month

## 2016-05-31 ENCOUNTER — Other Ambulatory Visit: Payer: Self-pay | Admitting: Family Medicine

## 2016-05-31 DIAGNOSIS — E119 Type 2 diabetes mellitus without complications: Secondary | ICD-10-CM

## 2016-06-02 ENCOUNTER — Telehealth (HOSPITAL_COMMUNITY): Payer: Self-pay

## 2016-06-02 NOTE — Telephone Encounter (Signed)
Encounter complete. 

## 2016-06-04 ENCOUNTER — Ambulatory Visit (HOSPITAL_COMMUNITY)
Admission: RE | Admit: 2016-06-04 | Discharge: 2016-06-04 | Disposition: A | Discharge status: Home / Self Care | Payer: 59 | Source: Ambulatory Visit | Attending: Internal Medicine | Admitting: Internal Medicine

## 2016-06-04 DIAGNOSIS — R072 Precordial pain: Secondary | ICD-10-CM | POA: Diagnosis not present

## 2016-06-04 DIAGNOSIS — R9431 Abnormal electrocardiogram [ECG] [EKG]: Secondary | ICD-10-CM | POA: Insufficient documentation

## 2016-06-04 DIAGNOSIS — I259 Chronic ischemic heart disease, unspecified: Secondary | ICD-10-CM | POA: Insufficient documentation

## 2016-06-04 MED ORDER — TECHNETIUM TC 99M TETROFOSMIN IV KIT
30.5000 | PACK | Freq: Once | INTRAVENOUS | Status: AC | PRN
  Administered 2016-06-04: 31 via INTRAVENOUS
  Filled 2016-06-04: qty 31

## 2016-06-04 MED ORDER — AMINOPHYLLINE 25 MG/ML IV SOLN
100.0000 mg | Freq: Once | INTRAVENOUS | Status: AC
  Administered 2016-06-04: 100 mg via INTRAVENOUS

## 2016-06-04 MED ORDER — REGADENOSON 0.4 MG/5ML IV SOLN
0.4000 mg | Freq: Once | INTRAVENOUS | Status: AC
  Administered 2016-06-04: 0.4 mg via INTRAVENOUS

## 2016-06-05 ENCOUNTER — Ambulatory Visit (HOSPITAL_COMMUNITY)
Admission: RE | Admit: 2016-06-05 | Discharge: 2016-06-05 | Disposition: A | Discharge status: Home / Self Care | Payer: 59 | Source: Ambulatory Visit | Attending: Cardiology | Admitting: Cardiology

## 2016-06-05 ENCOUNTER — Other Ambulatory Visit: Payer: Self-pay

## 2016-06-05 ENCOUNTER — Other Ambulatory Visit: Payer: Self-pay | Admitting: Cardiovascular Disease

## 2016-06-05 DIAGNOSIS — I2510 Atherosclerotic heart disease of native coronary artery without angina pectoris: Secondary | ICD-10-CM

## 2016-06-05 LAB — MYOCARDIAL PERFUSION IMAGING
Peak HR: 118 {beats}/min
Rest HR: 86 {beats}/min

## 2016-06-05 MED ORDER — TECHNETIUM TC 99M TETROFOSMIN IV KIT
30.1000 | PACK | Freq: Once | INTRAVENOUS | Status: AC | PRN
  Administered 2016-06-05: 30.1 via INTRAVENOUS

## 2016-06-05 MED ORDER — ASPIRIN EC 81 MG PO TBEC: 81.0000 mg | DELAYED_RELEASE_TABLET | Freq: Every day | ORAL | Status: DC

## 2016-06-10 ENCOUNTER — Ambulatory Visit (HOSPITAL_COMMUNITY)
Admission: RE | Admit: 2016-06-10 | Discharge: 2016-06-10 | Disposition: A | Discharge status: Home / Self Care | Payer: 59 | Source: Ambulatory Visit | Attending: Cardiovascular Disease | Admitting: Cardiovascular Disease

## 2016-06-10 ENCOUNTER — Encounter (HOSPITAL_COMMUNITY)
Admission: RE | Disposition: A | Discharge status: Home / Self Care | Payer: Self-pay | Source: Ambulatory Visit | Attending: Cardiovascular Disease

## 2016-06-10 DIAGNOSIS — Z6841 Body Mass Index (BMI) 40.0 and over, adult: Secondary | ICD-10-CM | POA: Insufficient documentation

## 2016-06-10 DIAGNOSIS — R9439 Abnormal result of other cardiovascular function study: Secondary | ICD-10-CM

## 2016-06-10 DIAGNOSIS — Z7984 Long term (current) use of oral hypoglycemic drugs: Secondary | ICD-10-CM | POA: Diagnosis not present

## 2016-06-10 DIAGNOSIS — Z803 Family history of malignant neoplasm of breast: Secondary | ICD-10-CM | POA: Insufficient documentation

## 2016-06-10 DIAGNOSIS — E119 Type 2 diabetes mellitus without complications: Secondary | ICD-10-CM | POA: Diagnosis not present

## 2016-06-10 DIAGNOSIS — I10 Essential (primary) hypertension: Secondary | ICD-10-CM | POA: Insufficient documentation

## 2016-06-10 DIAGNOSIS — Z833 Family history of diabetes mellitus: Secondary | ICD-10-CM | POA: Insufficient documentation

## 2016-06-10 DIAGNOSIS — I2510 Atherosclerotic heart disease of native coronary artery without angina pectoris: Secondary | ICD-10-CM | POA: Diagnosis not present

## 2016-06-10 DIAGNOSIS — R0789 Other chest pain: Secondary | ICD-10-CM | POA: Diagnosis not present

## 2016-06-10 HISTORY — PX: CARDIAC CATHETERIZATION: SHX172

## 2016-06-10 LAB — BASIC METABOLIC PANEL
Anion gap: 7 (ref 5–15)
BUN: 14 mg/dL (ref 6–20)
CO2: 24 mmol/L (ref 22–32)
Calcium: 9.5 mg/dL (ref 8.9–10.3)
Chloride: 110 mmol/L (ref 101–111)
Creatinine, Ser: 0.95 mg/dL (ref 0.61–1.24)
GFR calc Af Amer: >60 mL/min (ref >60)
GFR calc non Af Amer: >60 mL/min (ref >60)
Glucose, Bld: 123 mg/dL — ABNORMAL HIGH (ref 65–99)
Potassium: 4.4 mmol/L (ref 3.5–5.1)
Sodium: 141 mmol/L (ref 135–145)

## 2016-06-10 LAB — CBC
HCT: 39.9 % (ref 39.0–52.0)
Hemoglobin: 13.6 g/dL (ref 13.0–17.0)
MCH: 27.8 pg (ref 26.0–34.0)
MCHC: 34.1 g/dL (ref 30.0–36.0)
MCV: 81.4 fL (ref 78.0–100.0)
Platelets: 239 10*3/uL (ref 150–400)
RBC: 4.9 MIL/uL (ref 4.22–5.81)
RDW: 14.1 % (ref 11.5–15.5)
WBC: 6.2 10*3/uL (ref 4.0–10.5)

## 2016-06-10 LAB — GLUCOSE, CAPILLARY: Glucose-Capillary: 81 mg/dL (ref 65–99)

## 2016-06-10 LAB — PROTIME-INR
INR: 0.91
Prothrombin Time: 12.3 seconds (ref 11.4–15.2)

## 2016-06-10 SURGERY — LEFT HEART CATH AND CORONARY ANGIOGRAPHY
Anesthesia: LOCAL

## 2016-06-10 MED ORDER — LIDOCAINE HCL (PF) 1 % IJ SOLN
INTRAMUSCULAR | Status: DC | PRN
  Administered 2016-06-10: 7 mL

## 2016-06-10 MED ORDER — SODIUM CHLORIDE 0.9 % WEIGHT BASED INFUSION
3.0000 mL/kg/h | INTRAVENOUS | Status: DC
  Administered 2016-06-10: 3 mL/kg/h via INTRAVENOUS

## 2016-06-10 MED ORDER — VERAPAMIL HCL 2.5 MG/ML IV SOLN
INTRAVENOUS | Status: AC
  Filled 2016-06-10: qty 2

## 2016-06-10 MED ORDER — FENTANYL CITRATE (PF) 100 MCG/2ML IJ SOLN
INTRAMUSCULAR | Status: DC | PRN
  Administered 2016-06-10 (×2): 25 ug via INTRAVENOUS

## 2016-06-10 MED ORDER — FENTANYL CITRATE (PF) 100 MCG/2ML IJ SOLN
INTRAMUSCULAR | Status: AC
  Filled 2016-06-10: qty 2

## 2016-06-10 MED ORDER — LIDOCAINE HCL (PF) 1 % IJ SOLN
INTRAMUSCULAR | Status: AC
  Filled 2016-06-10: qty 30

## 2016-06-10 MED ORDER — HEPARIN SODIUM (PORCINE) 1000 UNIT/ML IJ SOLN
INTRAMUSCULAR | Status: AC
  Filled 2016-06-10: qty 1

## 2016-06-10 MED ORDER — SODIUM CHLORIDE 0.9% FLUSH: 3.0000 mL | INTRAVENOUS | Status: DC | PRN

## 2016-06-10 MED ORDER — SODIUM CHLORIDE 0.9 % IV SOLN: 250.0000 mL | INTRAVENOUS | Status: DC | PRN

## 2016-06-10 MED ORDER — MIDAZOLAM HCL 2 MG/2ML IJ SOLN
INTRAMUSCULAR | Status: AC
  Filled 2016-06-10: qty 2

## 2016-06-10 MED ORDER — IOPAMIDOL (ISOVUE-370) INJECTION 76%
INTRAVENOUS | Status: DC | PRN
  Administered 2016-06-10: 70 mL via INTRAVENOUS

## 2016-06-10 MED ORDER — VERAPAMIL HCL 2.5 MG/ML IV SOLN
INTRAVENOUS | Status: DC | PRN
  Administered 2016-06-10: 10 mL via INTRA_ARTERIAL

## 2016-06-10 MED ORDER — SODIUM CHLORIDE 0.9% FLUSH: 3.0000 mL | Freq: Two times a day (BID) | INTRAVENOUS | Status: DC

## 2016-06-10 MED ORDER — HEPARIN SODIUM (PORCINE) 1000 UNIT/ML IJ SOLN
INTRAMUSCULAR | Status: DC | PRN
  Administered 2016-06-10: 8000 [IU] via INTRAVENOUS

## 2016-06-10 MED ORDER — SODIUM CHLORIDE 0.9 % IV SOLN: INTRAVENOUS | Status: DC

## 2016-06-10 MED ORDER — MIDAZOLAM HCL 2 MG/2ML IJ SOLN
INTRAMUSCULAR | Status: DC | PRN
  Administered 2016-06-10 (×2): 1 mg via INTRAVENOUS

## 2016-06-10 MED ORDER — SODIUM CHLORIDE 0.9 % WEIGHT BASED INFUSION: 1.0000 mL/kg/h | INTRAVENOUS | Status: DC

## 2016-06-10 MED ORDER — ASPIRIN 81 MG PO CHEW
CHEWABLE_TABLET | ORAL | Status: AC
  Filled 2016-06-10: qty 1

## 2016-06-10 MED ORDER — ASPIRIN 81 MG PO CHEW
81.0000 mg | CHEWABLE_TABLET | ORAL | Status: AC
  Administered 2016-06-10: 81 mg via ORAL

## 2016-06-10 MED ORDER — HEPARIN (PORCINE) IN NACL 2-0.9 UNIT/ML-% IJ SOLN
INTRAMUSCULAR | Status: AC
  Filled 2016-06-10: qty 1000

## 2016-06-10 MED ORDER — HEPARIN (PORCINE) IN NACL 2-0.9 UNIT/ML-% IJ SOLN
INTRAMUSCULAR | Status: DC | PRN
Start: 2016-06-10 — End: 2016-06-10
  Administered 2016-06-10: 1000 mL via INTRA_ARTERIAL

## 2016-06-10 SURGICAL SUPPLY — 10 items
CATH OPTITORQUE JACKY 4.0 5F (CATHETERS) ×2 IMPLANT
COVER PRB 48X5XTLSCP FOLD TPE (BAG) ×1 IMPLANT
COVER PROBE 5X48 (BAG) ×1
GLIDESHEATH SLEND SS 6F .021 (SHEATH) ×2 IMPLANT
KIT HEART LEFT (KITS) ×2 IMPLANT
PACK CARDIAC CATHETERIZATION (CUSTOM PROCEDURE TRAY) ×2 IMPLANT
SYR MEDRAD MARK V 150ML (SYRINGE) ×2 IMPLANT
TRANSDUCER W/STOPCOCK (MISCELLANEOUS) ×2 IMPLANT
TUBING CIL FLEX 10 FLL-RA (TUBING) ×2 IMPLANT
WIRE HI TORQ VERSACORE-J 145CM (WIRE) ×2 IMPLANT

## 2016-06-10 NOTE — Interval H&P Note (Signed)
Cath Lab Visit (complete for each Cath Lab visit)  Clinical Evaluation Leading to the Procedure:   ACS: No.  Non-ACS:    Anginal Classification: CCS III  Anti-ischemic medical therapy: No Therapy  Non-Invasive Test Results: Intermediate-risk stress test findings: cardiac mortality 1-3%/year  Prior CABG: No previous CABG      History and Physical Interval Note:  06/10/2016 2:13 PM  Zenaida NieceLance Garrott  has presented today for surgery, with the diagnosis of cp, abnormal stress test  The various methods of treatment have been discussed with the patient and family. After consideration of risks, benefits and other options for treatment, the patient has consented to  Procedure(s): Left Heart Cath and Coronary Angiography (N/A) as a surgical intervention .  The patient's history has been reviewed, patient examined, no change in status, stable for surgery.  I have reviewed the patient's chart and labs.  Questions were answered to the patient's satisfaction.     Lorine BearsMuhammad Neva Ramaswamy

## 2016-06-10 NOTE — H&P (View-Only) (Signed)
Cardiology Office Note   Date:  05/27/2016   ID:  William Moss, DOB 1985-10-23, MRN [id number]  PCP:  William Shaggy, MD  Cardiologist:   William Si, MD   No chief complaint on file.     History of Present Illness: William Moss is a 30 y.o. male with hypertension, diabetes and morbid obesity who presents for an evaluation of chest pain.  William Moss reports intermittent episodes of chest pain that is been ongoing for years. However, last July they occurred more frequently and more intense. William Moss was seen in the ED 03/18/16 with chest pain.  At the time he ran out of his lisinopril and his BP was 168/116.  He had two sets of negative cardiac enzymes.   EKG showed sinus rhythm with non-specific t wave abnormalities.  The pain occurs approximately once per month, ever in July was occurring more frequently. The episodes last from 30-40 minutes. The pain is on the left side of his chest and radiates to the right. He reports it as a sharp, aching pain that is 7 out of 10 in severity. It typically occurs when he is sitting down and he wonders if it may be associated with stress. There is associated lightheadedness and diaphoresis but no nausea or shortness of breath.  He has not noticed any lower extremity edema, orthopnea, or PND. He does sometimes note bad taste in his mouth when he lays down or when he bends over.  He endorses belching and thinks that he may have acid reflux, though he has not taken any medication for this.   William Moss does not exercise regularly. When he does workout he likes to ride a stationary bike and lift weights.  When he does work out he doesn't have chest pain.  He has been out of his lisinopril for two weeks.   Past Medical History:  Diagnosis Date  . Diabetes mellitus without complication (HCC)   . Hemorrhoids    ?  Marland Kitchen Hypertension     Past Surgical History:  Procedure Laterality Date  . COLONOSCOPY N/A 02/04/2016   Procedure: COLONOSCOPY;  Surgeon: Hilarie Fredrickson, MD;  Location: [facility];  Service: Endoscopy;  Laterality: N/A;  . NO PAST SURGERIES       Current Outpatient Prescriptions  Medication Sig Dispense Refill  . Blood Glucose Monitoring Suppl (TRUE METRIX METER) DEVI 1 each by Does not apply route 3 (three) times daily before meals. 1 Device 0  . glipiZIDE (GLUCOTROL) 10 MG tablet TAKE 1 TABLET BY MOUTH TWICE A DAY BEFORE A MEAL 180 tablet 0  . glucose blood (TRUE METRIX BLOOD GLUCOSE TEST) test strip 3 times daily before meals 100 each 12  . JARDIANCE 10 MG TABS tablet TAKE 1 TABLET EVERY DAY 30 tablet 0  . lisinopril (PRINIVIL,ZESTRIL) 5 MG tablet Take 1 tablet (5 mg total) by mouth daily. 30 tablet 0  . metFORMIN (GLUCOPHAGE) 500 MG tablet TAKE 1 TABLET BY MOUTH TWICE A DAY WITH A MEAL 60 tablet 0  . Multiple Vitamins-Minerals (MULTIVITAMIN GUMMIES ADULT PO) Take 1 tablet by mouth daily.     . TRUEPLUS LANCETS 28G MISC 1 each by Does not apply route 3 (three) times daily before meals. 100 each 12   No current facility-administered medications for this visit.     Allergies:   Review of patient's allergies indicates no known allergies.    Social History:  The patient  reports that he has never smoked. He  has never used smokeless tobacco. He reports that he does not drink alcohol or use drugs.   Family History:  The patient's family history includes Alzheimer's disease in his paternal grandmother; Breast cancer in his cousin; Cancer in his maternal grandmother; Diabetes in his father and mother.    ROS:  Please see the history of present illness.   Otherwise, review of systems are positive for none.   All other systems are reviewed and negative.    PHYSICAL EXAM: VS:  BP (!) 142/66   Pulse 83   Ht 5\' 8"  (1.727 m)   Wt (!) 366 lb (166 kg)   BMI 55.65 kg/m  , BMI Body mass index is 55.65 kg/m. GENERAL:  Well appearing HEENT:  Pupils equal round and reactive, fundi not visualized, oral mucosa unremarkable NECK:  No  jugular venous distention, waveform within normal limits, carotid upstroke brisk and symmetric, no bruits, no thyromegaly LYMPHATICS:  No cervical adenopathy LUNGS:  Clear to auscultation bilaterally HEART:  RRR.  PMI not displaced or sustained,S1 and S2 within normal limits, no S3, no S4, no clicks, no rubs, no murmurs ABD:  Flat, positive bowel sounds normal in frequency in pitch, no bruits, no rebound, no guarding, no midline pulsatile mass, no hepatomegaly, no splenomegaly EXT:  2 plus pulses throughout, no edema, no cyanosis no clubbing SKIN:  No rashes no nodules NEURO:  Cranial nerves II through XII grossly intact, motor grossly intact throughout PSYCH:  Cognitively intact, oriented to person place and time    EKG:  EKG is ordered today. The ekg ordered today demonstrates sinus rhythm rate 83 bpm.  Non-specific t wave flattening.    Recent Labs: 01/09/2016: ALT 18 03/18/2016: BUN 16; Creatinine, Ser 0.95; Hemoglobin 13.3; Platelets 256; Potassium 3.8; Sodium 138    Lipid Panel    Component Value Date/Time   CHOL 163 08/29/2015 0905   TRIG 99 08/29/2015 0905   HDL 47 08/29/2015 0905   CHOLHDL 3.5 08/29/2015 0905   VLDL 20 08/29/2015 0905   LDLCALC 96 08/29/2015 0905      Wt Readings from Last 3 Encounters:  05/27/16 (!) 366 lb (166 kg)  03/18/16 (!) 340 lb (154.2 kg)  02/04/16 (!) 341 lb (154.7 kg)      ASSESSMENT AND PLAN:  # Chest pain: Mr. William Moss has atypical chest pain that seems like it is more related to GERD than ischemia.  However, he does have risk factors including morbid obesity and poorly-treated hypertension.  We will will obtain an exercise Myoview to evaluate for ischemia. If this is negative, we will do a PPI trial.    # Hypertension:  Blood pressure is poorly-controlled.  However, he has not been taking his lisinopril. We refilled a 3 month supply of his lisinopril today.    Current medicines are reviewed at length with the patient today.  The  patient does not have concerns regarding medicines.  The following changes have been made:  no change  Labs/ tests ordered today include: No orders of the defined types were placed in this encounter.    Disposition:   FU with Pura Picinich C. Duke Salviaandolph, MD, [facility] in 1 month.    This note was written with the assistance of speech recognition software.  Please excuse any transcriptional errors.  Signed, Meridith Romick C. Duke Salviaandolph, MD, [facility]  05/27/2016 8:06 AM    [facility]

## 2016-06-10 NOTE — Discharge Instructions (Signed)
Resume Metformin after 2 days.   Radial Site Care Refer to this sheet in the next few weeks. These instructions provide you with information about caring for yourself after your procedure. Your health care provider may also give you more specific instructions. Your treatment has been planned according to current medical practices, but problems sometimes occur. Call your health care provider if you have any problems or questions after your procedure. WHAT TO EXPECT AFTER THE PROCEDURE After your procedure, it is typical to have the following:  Bruising at the radial site that usually fades within 1-2 weeks.  Blood collecting in the tissue (hematoma) that may be painful to the touch. It should usually decrease in size and tenderness within 1-2 weeks. HOME CARE INSTRUCTIONS  Take medicines only as directed by your health care provider.  You may shower 24-48 hours after the procedure or as directed by your health care provider. Remove the bandage (dressing) and gently wash the site with plain soap and water. Pat the area dry with a clean towel. Do not rub the site, because this may cause bleeding.  Do not take baths, swim, or use a hot tub until your health care provider approves.  Check your insertion site every day for redness, swelling, or drainage.  Do not apply powder or lotion to the site.  Do not flex or bend the affected arm for 24 hours or as directed by your health care provider.  Do not push or pull heavy objects with the affected arm for 24 hours or as directed by your health care provider.  Do not lift over 10 lb (4.5 kg) for 5 days after your procedure or as directed by your health care provider.  Ask your health care provider when it is okay to:  Return to work or school.  Resume usual physical activities or sports.  Resume sexual activity.  Do not drive home if you are discharged the same day as the procedure. Have someone else drive you.  You may drive 24 hours  after the procedure unless otherwise instructed by your health care provider.  Do not operate machinery or power tools for 24 hours after the procedure.  If your procedure was done as an outpatient procedure, which means that you went home the same day as your procedure, a responsible adult should be with you for the first 24 hours after you arrive home.  Keep all follow-up visits as directed by your health care provider. This is important. SEEK MEDICAL CARE IF:  You have a fever.  You have chills.  You have increased bleeding from the radial site. Hold pressure on the site. CALL 911 SEEK IMMEDIATE MEDICAL CARE IF:  You have unusual pain at the radial site.  You have redness, warmth, or swelling at the radial site.  You have drainage (other than a small amount of blood on the dressing) from the radial site.  The radial site is bleeding, and the bleeding does not stop after 30 minutes of holding steady pressure on the site.  Your arm or hand becomes pale, cool, tingly, or numb.   This information is not intended to replace advice given to you by your health care provider. Make sure you discuss any questions you have with your health care provider.   Document Released: 09/19/2010 Document Revised: 09/07/2014 Document Reviewed: 03/05/2014 Elsevier Interactive Patient Education [employer]2016 Elsevier Inc.

## 2016-06-10 NOTE — Research (Signed)
[facility] Study Informed Consent   Subject Name: William Moss  Subject met inclusion and exclusion criteria.  The informed consent form, study requirements and expectations were reviewed with the subject and questions and concerns were addressed prior to the signing of the consent form.  The subject verbalized understanding of the trial requirements.  The subject agreed to participate in the trial and signed the informed consent on 06/10/2016 at 1203.  The informed consent was obtained prior to performance of any protocol-specific procedures for the subject.  A copy of the signed informed consent was given to the subject and a copy was placed in the subject's medical record.  Blossom Hoops 06/10/2016, 3:09 PM

## 2016-06-11 ENCOUNTER — Encounter (HOSPITAL_COMMUNITY): Payer: Self-pay | Admitting: Cardiovascular Disease

## 2016-06-11 ENCOUNTER — Telehealth: Payer: Self-pay | Admitting: *Deleted

## 2016-06-11 NOTE — Telephone Encounter (Signed)
Patient phoned needing letter to return to work

## 2016-06-12 ENCOUNTER — Encounter: Payer: Self-pay | Admitting: *Deleted

## 2016-06-12 NOTE — Telephone Encounter (Signed)
OK per Dr Duke Salviaandolph to return to work on Monday 06/15/2016 Letter at front desk for pick up

## 2016-06-15 ENCOUNTER — Encounter (HOSPITAL_COMMUNITY): Payer: Self-pay | Admitting: Emergency Medicine

## 2016-06-15 ENCOUNTER — Emergency Department (HOSPITAL_COMMUNITY)
Admission: EM | Admit: 2016-06-15 | Discharge: 2016-06-16 | Disposition: A | Discharge status: Home / Self Care | Payer: 59 | Attending: Emergency Medicine | Admitting: Emergency Medicine

## 2016-06-15 DIAGNOSIS — R202 Paresthesia of skin: Secondary | ICD-10-CM | POA: Diagnosis not present

## 2016-06-15 DIAGNOSIS — Z7984 Long term (current) use of oral hypoglycemic drugs: Secondary | ICD-10-CM | POA: Diagnosis not present

## 2016-06-15 DIAGNOSIS — I10 Essential (primary) hypertension: Secondary | ICD-10-CM | POA: Insufficient documentation

## 2016-06-15 DIAGNOSIS — Z9889 Other specified postprocedural states: Secondary | ICD-10-CM

## 2016-06-15 DIAGNOSIS — I2510 Atherosclerotic heart disease of native coronary artery without angina pectoris: Secondary | ICD-10-CM | POA: Diagnosis not present

## 2016-06-15 DIAGNOSIS — E119 Type 2 diabetes mellitus without complications: Secondary | ICD-10-CM | POA: Diagnosis not present

## 2016-06-15 DIAGNOSIS — Z79899 Other long term (current) drug therapy: Secondary | ICD-10-CM | POA: Insufficient documentation

## 2016-06-15 DIAGNOSIS — R200 Anesthesia of skin: Secondary | ICD-10-CM | POA: Diagnosis present

## 2016-06-15 NOTE — ED Triage Notes (Signed)
[facility].  He began to have numbness and tingling in his right thumb since Friday and his DC instructions told him to be seen if this occurred.  No other concerns.

## 2016-06-16 NOTE — ED Provider Notes (Signed)
[facility]-EMERGENCY DEPT Provider Note   CSN: [id number] Arrival date & time: 06/15/16  1908  By signing my name below, I, [name], attest that this documentation has been prepared under the direction and in the presence of Geoffery Lyonsouglas Jahmad Petrich, MD . Electronically Signed: [name], Scribe. 06/16/2016. 12:09 AM.  History   Chief Complaint Chief Complaint  Patient presents with  . Numbness    The history is provided by the patient and medical records. No language interpreter was used.    HPI Comments:  William Moss is a 30 y.o. male who presents to the Emergency Department complaining of gradually worsening sharp, shooting pain and tingling begins in his right thumb and radiates to his right wrist.  He notes numbness to the touch at the site.  The sensation is worse when twisting his wrist, reaching up or lifting something.  Pt had a heart cath done on 06/10/16.  Entry site right wrist; pt denies procedural complications.  Pt denies weakness or decreased ROM.  Past Medical History:  Diagnosis Date  . Atypical chest pain 05/27/2016  . Diabetes mellitus without complication (HCC)   . Hemorrhoids    ?  William Moss. Hypertension     Patient Active Problem List   Diagnosis Date Noted  . Coronary artery disease involving native heart   . Abnormal nuclear stress test   . Atypical chest pain 05/27/2016  . Rectal bleeding   . Diabetes mellitus (HCC) 09/30/2015  . Hematochezia 09/30/2015  . Hypertension 07/29/2015  . Morbid obesity (HCC) 07/29/2015    Past Surgical History:  Procedure Laterality Date  . CARDIAC CATHETERIZATION N/A 06/10/2016   Procedure: Left Heart Cath and Coronary Angiography;  Surgeon: Iran OuchMuhammad A Arida, MD;  Location: [facility];  Service: Cardiovascular;  Laterality: N/A;  . COLONOSCOPY N/A 02/04/2016   Procedure: COLONOSCOPY;  Surgeon: Hilarie FredricksonJohn N Perry, MD;  Location: [facility];  Service: Endoscopy;  Laterality: N/A;  . NO PAST SURGERIES         Home  Medications    Prior to Admission medications   Medication Sig Start Date End Date Taking? Authorizing Provider  Blood Glucose Monitoring Suppl (TRUE METRIX METER) DEVI 1 each by Does not apply route 3 (three) times daily before meals. 07/29/15   Jaclyn ShaggyEnobong Amao, MD  glipiZIDE (GLUCOTROL) 10 MG tablet TAKE 1 TABLET BY MOUTH TWICE A DAY BEFORE A MEAL Patient taking differently: Take 10 mg by mouth 2 (two) times daily before a meal.  05/11/16   Jaclyn ShaggyEnobong Amao, MD  glucose blood (TRUE METRIX BLOOD GLUCOSE TEST) test strip 3 times daily before meals 07/29/15   Jaclyn ShaggyEnobong Amao, MD  JARDIANCE 10 MG TABS tablet TAKE 1 TABLET EVERY DAY Patient taking differently: Take 10 mg by mouth every day 04/28/16   Jaclyn ShaggyEnobong Amao, MD  lisinopril (PRINIVIL,ZESTRIL) 5 MG tablet Take 1 tablet (5 mg total) by mouth daily. 05/27/16   Chilton Siiffany Langley [place], MD  metFORMIN (GLUCOPHAGE) 500 MG tablet TAKE 1 TABLET BY MOUTH TWICE A DAY WITH A MEAL Patient taking differently: Take 500 mg by mouth twice daily 06/01/16   Jaclyn ShaggyEnobong Amao, MD  TRUEPLUS LANCETS 28G MISC 1 each by Does not apply route 3 (three) times daily before meals. 07/29/15   Jaclyn ShaggyEnobong Amao, MD    Family History Family History  Problem Relation Age of Onset  . Diabetes Mother   . Diabetes Father   . Cancer Maternal Grandmother   . Breast cancer Cousin   . Alzheimer's disease Paternal Grandmother  Social History Social History  Substance Use Topics  . Smoking status: Never Smoker  . Smokeless tobacco: Never Used  . Alcohol use No     Allergies   Review of patient's allergies indicates no known allergies.   Review of Systems Review of Systems  Musculoskeletal: Positive for myalgias.  Neurological: Positive for numbness.  All other systems reviewed and are negative.    Physical Exam Updated Vital Signs BP (!) 169/103 (BP Location: Left Arm)   Pulse 94   Temp 99 F (37.2 C) (Oral)   Resp 18   Ht 5\' 8"  (1.727 m)   Wt (!) 360 lb (163.3 kg)   SpO2 96%    BMI 54.74 kg/m   Physical Exam  Constitutional: He is oriented to person, place, and time. He appears well-developed and well-nourished. No distress.  HENT:  Head: Normocephalic and atraumatic.  Eyes: Conjunctivae are normal.  Cardiovascular: Normal rate.   Pulmonary/Chest: Effort normal.  Musculoskeletal: Normal range of motion.  The right wrist appears grossly normal. The cardiac cath puncture site appears clean without erythema or swelling. Radial pulses easily palpable and capillary refill is brisk throughout the entire hand. He is able to flex, opposed, and extend all fingers without difficulty or discomfort.  Neurological: He is alert and oriented to person, place, and time.  Skin: Skin is warm and dry.  Psychiatric: He has a normal mood and affect.  Nursing note and vitals reviewed.    ED Treatments / Results   DIAGNOSTIC STUDIES:  Oxygen Saturation is 96% on RA, NML by my interpretation.    COORDINATION OF CARE:  12:09 AM Discussed treatment plan with pt at bedside and pt agreed to plan.  Labs (all labs ordered are listed, but only abnormal results are displayed) Labs Reviewed - No data to display  EKG  EKG Interpretation None       Radiology No results found.  Procedures Procedures (including critical care time)  Medications Ordered in ED Medications - No data to display   Initial Impression / Assessment and Plan / ED Course  I have reviewed the triage vital signs and the nursing notes.  Pertinent labs & imaging results that were available during my care of the patient were reviewed by me and considered in my medical decision making (see chart for details).  Clinical Course    Patient presents with numbness in his hand that occurs intermittently since undergoing a cardiac cath using the radial artery last week. The puncture site appears clean and intact. The hand appears well-perfused. I discussed the situation with Dr. Shirlee Latch from cardiology who  does not feel as though any immediate imaging or intervention is necessary. He is recommending follow-up in the cardiology office tomorrow. The patient will be discharged with instructions to call to make these appointments arrangements.  Final Clinical Impressions(s) / ED Diagnoses   Final diagnoses:  None    New Prescriptions New Prescriptions   No medications on file   I personally performed the services described in this documentation, which was scribed in my presence. The recorded information has been reviewed and is accurate.       Geoffery Lyons, MD 06/16/16 [phone]

## 2016-06-16 NOTE — Discharge Instructions (Signed)
You are to follow-up in the cardiology clinic in the next 1-2 days for a recheck of your wrist.  Return to the emergency department if your symptoms significantly worsen or change.

## 2016-06-17 ENCOUNTER — Telehealth: Payer: Self-pay | Admitting: Cardiovascular Disease

## 2016-06-17 NOTE — Telephone Encounter (Signed)
New Message  Pt voiced he's scheduled to f/u with Cardiologist within a day from post Ed.  Soonest appt is 06/25/2016, advised pt it's up to him whether if he needs to come in a day sooner than his original appt.  Pt voiced he's following instructions per discharge summary and would like a nurse to f/u with him.  Please f/u with pt

## 2016-06-17 NOTE — Telephone Encounter (Signed)
Returned call to patient no answer.LMTC. 

## 2016-06-18 NOTE — Telephone Encounter (Signed)
Left message to call back  

## 2016-06-18 NOTE — Telephone Encounter (Signed)
Spoke to patient. Offered 2 different appointments to follow up from ER VISIT. Patient decline due to schedule issue due to work. Patient states he will keep his appointment already has- if becomes worse will go back to ER. RN SUGGEST contacting primary or URGENT CARE INSTEAD OF ER.PATIENT VOICED UNDERSTANDING

## 2016-06-26 ENCOUNTER — Telehealth: Payer: Self-pay | Admitting: *Deleted

## 2016-06-26 ENCOUNTER — Encounter: Payer: Self-pay | Admitting: Cardiovascular Disease

## 2016-06-26 ENCOUNTER — Ambulatory Visit (INDEPENDENT_AMBULATORY_CARE_PROVIDER_SITE_OTHER): Payer: 59 | Admitting: Cardiovascular Disease

## 2016-06-26 VITALS — BP 220/111 | HR 90 | Ht 68.0 in | Wt 375.0 lb

## 2016-06-26 DIAGNOSIS — I10 Essential (primary) hypertension: Secondary | ICD-10-CM | POA: Diagnosis not present

## 2016-06-26 DIAGNOSIS — M25531 Pain in right wrist: Secondary | ICD-10-CM

## 2016-06-26 MED ORDER — TRAMADOL HCL 50 MG PO TABS: ORAL_TABLET | ORAL | 0 refills | Status: DC

## 2016-06-26 MED ORDER — LISINOPRIL 20 MG PO TABS: 20.0000 mg | ORAL_TABLET | Freq: Every day | ORAL | 1 refills | Status: DC

## 2016-06-26 NOTE — Telephone Encounter (Signed)
Patient called today at 4:45 wanting to come for his blood pressure check Advised he could come if he could get to office before 5 Patient did not show up Discussed with Dr Duke Salviaandolph and she wanted for him to go for blood pressure check at [facility], [facility], local pharmacy to see if it was any better. If it was still elevated to call service and have someone call him to discuss blood pressure and options Called patient and left detailed message ok per [facility] LPDPR

## 2016-06-26 NOTE — Patient Instructions (Addendum)
Medication Instructions:  INCREASE YOUR LISINOPRIL TO 20 MG DAILY  TYLENOL 500 MG 1 TO 2 TABLETS 4 TIMES A DAY AS NEEDED FOR PAIN  TRAMADOL 50 MG 1 TO 2 TABLETS AT BEDTIME AS NEEDED FOR PAIN   Labwork: NONE  Testing/Procedures: NONE  Follow-Up: Your physician recommends that you schedule a follow-up appointment in: 2 WEEKS NP/PA  Your physician recommends that you schedule a follow-up appointment in: 1 MONTH OV.   Any Other Special Instructions Will Be Listed Below (If Applicable). GO HOME AND TAKE 4 (20 MG TOTAL) OF YOUR LISINOPRIL. RETURN TO THE OFFICE ON YOUR WAY TO WORK TODAY FOR A BLOOD PRESSURE CHECK  If you need a refill on your cardiac medications before your next appointment, please call your pharmacy.

## 2016-06-26 NOTE — Progress Notes (Signed)
Cardiology Office Note   Date:  06/26/2016   ID:  William Moss, DOB [birth year], MRN [id number]  PCP:  Jaclyn Shaggy, MD  Cardiologist:   Chilton Si, MD   Chief Complaint  Patient presents with  . Follow-up    1 month; Pt states no Sx.      History of Present Illness: William Moss is a 30 y.o. male with hypertension, diabetes and morbid obesity who presents for follow up.  He was first seen 05/2016 with reports of chest pain. He was referred for [facility] that was concerning for anteroapical ischemia. He subsequently underwent left heart catheterization on 06/04/16 that was negative for ischemia. At his last appointment his blood pressure was poorly-controlled. However he had not taken his medication at that time.This morning he also has not taken his medication. He does not check his blood pressures at home. He did note that he had a headache this morning but denies any vision changes or chest pain. In general his chest pain has been much better since his heart catheterization. He denies any shortness of breath, lower extremity edema, orthopnea, or PND. His only complaint is numbness in his right wrist and shooting arm pain when he moves his thumb. The pain is 7 out of 10 in severity and started after heart catheterization. He has not been exercising lately.       Past Medical History:  Diagnosis Date  . Atypical chest pain 05/27/2016  . Diabetes mellitus without complication (HCC)   . Hemorrhoids    ?  Marland Kitchen Hypertension     Past Surgical History:  Procedure Laterality Date  . CARDIAC CATHETERIZATION N/A 06/10/2016   Procedure: Left Heart Cath and Coronary Angiography;  Surgeon: Iran Ouch, MD;  [facility];  Service: Cardiovascular;  Laterality: N/A;  . COLONOSCOPY N/A 02/04/2016   Procedure: COLONOSCOPY;  Surgeon: Hilarie Fredrickson, MD;  Location: [facility] ENDOSCOPY;  Service: Endoscopy;  Laterality: N/A;  . NO PAST SURGERIES       Current Outpatient  Prescriptions  Medication Sig Dispense Refill  . Blood Glucose Monitoring Suppl (TRUE METRIX METER) DEVI 1 each by Does not apply route 3 (three) times daily before meals. 1 Device 0  . glipiZIDE (GLUCOTROL) 10 MG tablet TAKE 1 TABLET BY MOUTH TWICE A DAY BEFORE A MEAL (Patient taking differently: Take 10 mg by mouth 2 (two) times daily before a meal. ) 180 tablet 0  . glucose blood (TRUE METRIX BLOOD GLUCOSE TEST) test strip 3 times daily before meals 100 each 12  . JARDIANCE 10 MG TABS tablet TAKE 1 TABLET EVERY DAY (Patient taking differently: Take 10 mg by mouth every day) 30 tablet 0  . lisinopril (PRINIVIL,ZESTRIL) 5 MG tablet Take 1 tablet (5 mg total) by mouth daily. 90 tablet 3  . metFORMIN (GLUCOPHAGE) 500 MG tablet TAKE 1 TABLET BY MOUTH TWICE A DAY WITH A MEAL (Patient taking differently: Take 500 mg by mouth twice daily) 180 tablet 0  . TRUEPLUS LANCETS 28G MISC 1 each by Does not apply route 3 (three) times daily before meals. 100 each 12   No current facility-administered medications for this visit.     Allergies:   Review of patient's allergies indicates no known allergies.    Social History:  The patient  reports that he has never smoked. He has never used smokeless tobacco. He reports that he does not drink alcohol or use drugs.   Family History:  The patient's  family history includes Alzheimer's disease in his paternal grandmother; Breast cancer in his cousin; Cancer in his maternal grandmother; Diabetes in his father and mother.    ROS:  Please see the history of present illness.   Otherwise, review of systems are positive for none.   All other systems are reviewed and negative.    PHYSICAL EXAM: VS:  BP (!) 220/111   Pulse 90   Ht 5\' 8"  (1.727 m)   Wt (!) 170.1 kg (375 lb)   BMI 57.02 kg/m  , BMI Body mass index is 57.02 kg/m. GENERAL:  Well appearing HEENT:  Pupils equal round and reactive, fundi not visualized, oral mucosa unremarkable NECK:  No jugular venous  distention, waveform within normal limits, carotid upstroke brisk and symmetric, no bruits, no thyromegaly LYMPHATICS:  No cervical adenopathy LUNGS:  Clear to auscultation bilaterally HEART:  RRR.  PMI not displaced or sustained,S1 and S2 within normal limits, no S3, no S4, no clicks, no rubs, no murmurs ABD:  Flat, positive bowel sounds normal in frequency in pitch, no bruits, no rebound, no guarding, no midline pulsatile mass, no hepatomegaly, no splenomegaly EXT:  2 plus pulses throughout, no edema, no cyanosis no clubbing.   SKIN:  No rashes no nodules NEURO:  Cranial nerves II through XII grossly intact, motor grossly intact throughout.  Strength 5/5 bilateral UEs.  Sensation in tact. PSYCH:  Cognitively intact, oriented to person place and time   EKG:  EKG is ordered today. The ekg ordered today demonstrates sinus rhythm rate 83 bpm.  Non-specific t wave flattening.   Lexiscan Myoview 06/05/16:  There was no ST segment deviation noted during stress.  Findings consistent with ischemia.  This is a low risk study.  The left ventricular ejection fraction is mildly decreased (45-54%).   Small area of moderate anteroapical ischemia no infarct  EF 52 %   LHC 06/10/16:  The left ventricular systolic function is normal.  LV end diastolic pressure is mildly elevated.   1. Tortuous but normal coronary arteries. 2. Low LV systolic function and mildly elevated left ventricular end-diastolic pressure. LV visualization was not optimal and the patient had PVCs.  Recent Labs: 01/09/2016: ALT 18 06/10/2016: BUN 14; Creatinine, Ser 0.95; Hemoglobin 13.6; Platelets 239; Potassium 4.4; Sodium 141    Lipid Panel    Component Value Date/Time   CHOL 163 08/29/2015 0905   TRIG 99 08/29/2015 0905   HDL 47 08/29/2015 0905   CHOLHDL 3.5 08/29/2015 0905   VLDL 20 08/29/2015 0905   LDLCALC 96 08/29/2015 0905      Wt Readings from Last 3 Encounters:  06/26/16 (!) 170.1 kg (375 lb)    06/15/16 (!) 163.3 kg (360 lb)  06/10/16 (!) 163.3 kg (360 lb)      ASSESSMENT AND PLAN:  # Hypertensive urgency: Mr. William Moss's blood pressure is very poorly-controlled.  He didn't take his blood pressure medication yet today.  He is only on lisinopril 5 mg daily, which isn't enough to get his blood pressure down.  We will increase his lisinopril to 20mg  daily.  I have asked him to come back to the office this afternoon to check his BP.  He will also get a home BP cuff.  If he develops symptoms he will be seen in the ED.  # Wrist pain: [name] has pain in his wrist from his heart cath.  He is neurologically in tact.  He will take actaminophen 500-1000mg  q6h prn.  We will give  him tramadol 50-100mg  qhs prn (#20).  #Chest pain: Mr. Audi has atypical chest pain that seems like it is more related to GERD than ischemia.  Stress test was negative for ischemia.    Current medicines are reviewed at length with the patient today.  The patient does not have concerns regarding medicines.  The following changes have been made:  no change  Labs/ tests ordered today include: No orders of the defined types were placed in this encounter.    Disposition:   FU with Sovereign Ramiro C. Duke Salvia, MD, [facility] in 2 weeks   This note was written with the assistance of speech recognition software.  Please excuse any transcriptional errors.  Signed, Flor Whitacre C. Duke Salvia, MD, [facility]  06/26/2016 9:03 AM    [facility]

## 2016-07-01 NOTE — Telephone Encounter (Signed)
Left message to call back  

## 2016-07-07 ENCOUNTER — Encounter: Payer: Self-pay | Admitting: Physician Assistant

## 2016-07-10 ENCOUNTER — Encounter: Payer: Self-pay | Admitting: Physician Assistant

## 2016-07-10 ENCOUNTER — Ambulatory Visit (INDEPENDENT_AMBULATORY_CARE_PROVIDER_SITE_OTHER): Payer: 59 | Admitting: Physician Assistant

## 2016-07-10 VITALS — BP 232/110 | HR 90 | Ht 68.0 in | Wt 375.6 lb

## 2016-07-10 DIAGNOSIS — I10 Essential (primary) hypertension: Secondary | ICD-10-CM | POA: Diagnosis not present

## 2016-07-10 DIAGNOSIS — E118 Type 2 diabetes mellitus with unspecified complications: Secondary | ICD-10-CM

## 2016-07-10 LAB — BASIC METABOLIC PANEL
BUN: 17 mg/dL (ref 7–25)
CO2: 26 mmol/L (ref 20–31)
Calcium: 9.3 mg/dL (ref 8.6–10.3)
Chloride: 107 mmol/L (ref 98–110)
Creat: 1 mg/dL (ref 0.60–1.35)
Glucose, Bld: 102 mg/dL — ABNORMAL HIGH (ref 65–99)
Potassium: 4.5 mmol/L (ref 3.5–5.3)
Sodium: 141 mmol/L (ref 135–146)

## 2016-07-10 MED ORDER — CARVEDILOL 12.5 MG PO TABS: 12.5000 mg | ORAL_TABLET | Freq: Two times a day (BID) | ORAL | 3 refills | Status: DC

## 2016-07-10 MED ORDER — LISINOPRIL 40 MG PO TABS: 40.0000 mg | ORAL_TABLET | Freq: Every day | ORAL | 3 refills | Status: DC

## 2016-07-10 NOTE — Telephone Encounter (Signed)
Patient currently in office for ov

## 2016-07-10 NOTE — Patient Instructions (Signed)
Medication Instructions:  INCREASE Lisinopril to 40mg  take 1 tablet by mouth  START Coreg(Carvedilol) 12.5mg  Take 1 tablet by mouth twice a day.  Labwork: Your physician recommends that you return for lab work in: TODAY-BMET  Testing/Procedures: None   Follow-Up: Your physician recommends that you schedule a follow-up appointment in: 1 MONTH WITH DR Wildwood. Your physician recommends that you schedule a follow-up appointment in: 1-2 WEEK IN HYPERTENSION CLINIC  Any Other Special Instructions Will Be Listed Below (If Applicable).  NEW MEDS SENT OVER TO PHARMACY  If you need a refill on your cardiac medications before your next appointment, please call your pharmacy.

## 2016-07-10 NOTE — Progress Notes (Signed)
Cardiology Office Note    Date:  07/10/2016   ID:  William NieceLance Cardenas, DOB 16-Aug-1986, MRN [id number]  PCP:  Jaclyn ShaggyEnobong, Amao, MD  Cardiologist:  Dr. Duke Salviaandolph  Chief Complaint  Patient presents with  . Follow-up    seen for Dr. Duke Salviaandolph, uncontrolled BP    History of Present Illness:  William Moss is a 30 y.o. male with PMH of HTN, DM II and morbid obesity. He was first seen in September 2017 with report of chest pain, [facility] somewhat abnormal with concern for anteroapical ischemia. He subsequently underwent left heart cath on 06/04/2016 that was negative for CAD. He has poorly controlled hypertension, his medication was adjusted. On the last follow-up on 06/26/2016, he was complaining of significant pain in his right wrist. His lisinopril was increased to 20 mg daily at the time. He was also given tramadol.  He presented for cardiology office visit today, his blood pressure on arrival was 232/110. Apparently she just took his blood pressure medication right before he left home. After he rested for 10 minutes, his blood pressure was rechecked and it came down to 203/123. He denies any chest pain or shortness of breath. Otherwise he has no acute issues, he still has some numbness and tingling over his right thumb. However he has 2+ pulses in the radial artery. I have increased his lisinopril to 40 mg daily, I will also add 12.5 mg twice a day of carvedilol. I will refer him to our hypertension clinic to be seen in the next one to 2 weeks for further up titration of his medication with possibility of adding hydrochlorothiazide and amlodipine. He will follow-up with Dr. Duke Salviaandolph in one month. I have advised him to keep a blood pressure diary and bring it to the next office visit.    Past Medical History:  Diagnosis Date  . Atypical chest pain 05/27/2016  . Diabetes mellitus without complication (HCC)   . Hemorrhoids    ?  Marland Kitchen. Hypertension     Past Surgical  History:  Procedure Laterality Date  . CARDIAC CATHETERIZATION N/A 06/10/2016   Procedure: Left Heart Cath and Coronary Angiography;  Surgeon: Iran OuchMuhammad A Arida, MD;  Location: [facility] INVASIVE CV LAB;  Service: Cardiovascular;  Laterality: N/A;  . COLONOSCOPY N/A 02/04/2016   Procedure: COLONOSCOPY;  Surgeon: Hilarie FredricksonJohn N Perry, MD;  Location: [facility] ENDOSCOPY;  Service: Endoscopy;  Laterality: N/A;  . NO PAST SURGERIES      Current Medications: Outpatient Medications Prior to Visit  Medication Sig Dispense Refill  . acetaminophen (TYLENOL) 500 MG tablet Take 500 mg by mouth as directed. TAKE 1 TO 2 TABLETS BY MOUTH 4 TIMES A DAY AS NEEDED FOR PAIN    . Blood Glucose Monitoring Suppl (TRUE METRIX METER) DEVI 1 each by Does not apply route 3 (three) times daily before meals. 1 Device 0  . glipiZIDE (GLUCOTROL) 10 MG tablet TAKE 1 TABLET BY MOUTH TWICE A DAY BEFORE A MEAL (Patient taking differently: Take 10 mg by mouth 2 (two) times daily before a meal. ) 180 tablet 0  . glucose blood (TRUE METRIX BLOOD GLUCOSE TEST) test strip 3 times daily before meals 100 each 12  . JARDIANCE 10 MG TABS tablet TAKE 1 TABLET EVERY DAY (Patient taking differently: Take 10 mg by mouth every day) 30 tablet 0  . metFORMIN (GLUCOPHAGE) 500 MG tablet TAKE 1 TABLET BY MOUTH TWICE A DAY WITH A MEAL (Patient taking differently: Take  500 mg by mouth twice daily) 180 tablet 0  . traMADol (ULTRAM) 50 MG tablet 1 TO 2 TABLETS BY MOUTH AT BEDTIME AS NEEDED 10 tablet 0  . TRUEPLUS LANCETS 28G MISC 1 each by Does not apply route 3 (three) times daily before meals. 100 each 12  . lisinopril (PRINIVIL,ZESTRIL) 20 MG tablet Take 1 tablet (20 mg total) by mouth daily. 90 tablet 1   No facility-administered medications prior to visit.      Allergies:   Patient has no known allergies.   Social History   Social History  . Marital status: Single    Spouse name: N/A  . Number of children: 0  . Years of education: N/A   Occupational  History  . call center rep    Social History Main Topics  . Smoking status: Never Smoker  . Smokeless tobacco: Never Used  . Alcohol use No  . Drug use: No  . Sexual activity: Not Asked   Other Topics Concern  . None   Social History Narrative  . None     Family History:  The patient's family history includes Alzheimer's disease in his paternal grandmother; Breast cancer in his cousin; Cancer in his maternal grandmother; Diabetes in his father and mother.   ROS:   Please see the history of present illness.    ROS All other systems reviewed and are negative.   PHYSICAL EXAM:   VS:  BP (!) 232/110   Pulse 90   Ht 5\' 8"  (1.727 m)   Wt (!) 375 lb 9.6 oz (170.4 kg)   BMI 57.11 kg/m    GEN: Well nourished, well developed, in no acute distress  HEENT: normal  Neck: no JVD, carotid bruits, or masses Cardiac: RRR; no murmurs, rubs, or gallops,no edema  Respiratory:  clear to auscultation bilaterally, normal work of breathing GI: soft, nontender, nondistended, + BS MS: no deformity or atrophy  Skin: warm and dry, no rash Neuro:  Alert and Oriented x 3, Strength and sensation are intact Psych: euthymic mood, full affect  Wt Readings from Last 3 Encounters:  07/10/16 (!) 375 lb 9.6 oz (170.4 kg)  06/26/16 (!) 375 lb (170.1 kg)  06/15/16 (!) 360 lb (163.3 kg)      Studies/Labs Reviewed:   EKG:  EKG is not ordered today.   Recent Labs: 01/09/2016: ALT 18 06/10/2016: Hemoglobin 13.6; Platelets 239 07/10/2016: BUN 17; Creat 1.00; Potassium 4.5; Sodium 141   Lipid Panel    Component Value Date/Time   CHOL 163 08/29/2015 0905   TRIG 99 08/29/2015 0905   HDL 47 08/29/2015 0905   CHOLHDL 3.5 08/29/2015 0905   VLDL 20 08/29/2015 0905   LDLCALC 96 08/29/2015 0905    Additional studies/ records that were reviewed today include:   Myoview 06/05/2016 Study Highlights     There was no ST segment deviation noted during stress.  Findings consistent with  ischemia.  This is a low risk study.  The left ventricular ejection fraction is mildly decreased (45-54%).   Small area of moderate anteroapical ischemia no infarct  EF 52 %     Cath 06/10/2016  Conclusion     The left ventricular systolic function is normal.  LV end diastolic pressure is mildly elevated.   1. Tortuous but normal coronary arteries. 2. Low LV systolic function and mildly elevated left ventricular end-diastolic pressure. LV visualization was not optimal and the patient had PVCs.  Recommendations: False-positive nuclear stress test. Continue treatment of  risk factors. Consider an echocardiogram for more accurate assessment of LV systolic/diastolic function and evaluation of pulmonary pressure.      ASSESSMENT:    1. Uncontrolled hypertension   2. Type 2 diabetes mellitus with complication, without long-term current use of insulin (HCC)   3. Morbid obesity (HCC)   4. Essential hypertension      PLAN:  In order of problems listed above:  1. Uncontrolled hypertension  - Likely related to morbid obesity, and had a discussion with the patient regarding blood pressure control. I have increased his lisinopril to 40 mg daily, added carvedilol 12.5 mg twice a day. Given the changes WITH lisinopril, we will obtain a basic metabolic panel today. I will also refer the patient to our hypertension clinic.  2. Type 2 diabetes mellitus: Managed by PCP.  3. Morbid obesity: He is over 375 pounds, which is likely contributing to his uncontrolled high blood pressure.    Medication Adjustments/Labs and Tests Ordered: Current medicines are reviewed at length with the patient today.  Concerns regarding medicines are outlined above.  Medication changes, Labs and Tests ordered today are listed in the Patient Instructions below. Patient Instructions  Medication Instructions:  INCREASE Lisinopril to 40mg  take 1 tablet by mouth  START Coreg(Carvedilol) 12.5mg  Take 1 tablet  by mouth twice a day.  Labwork: Your physician recommends that you return for lab work in: TODAY-BMET  Testing/Procedures: None   Follow-Up: Your physician recommends that you schedule a follow-up appointment in: 1 MONTH WITH DR Fordyce. Your physician recommends that you schedule a follow-up appointment in: 1-2 WEEK IN HYPERTENSION CLINIC  Any Other Special Instructions Will Be Listed Below (If Applicable).  NEW MEDS SENT OVER TO PHARMACY  If you need a refill on your cardiac medications before your next appointment, please call your pharmacy.     Ramond Dial, [place]  07/10/2016 11:50 PM    [facility] [address] [place], [place], [place]  [address] Phone: [phone]; Fax: [phone]

## 2016-07-27 NOTE — Progress Notes (Signed)
Cardiology Office Note   Date:  07/28/2016   ID:  William Moss, DOB 1986/01/20, MRN [id number]  PCP:  Jaclyn ShaggyEnobong, Amao, MD  Cardiologist:   Chilton Siiffany Vancleave, MD   Chief Complaint  Patient presents with  . Follow-up    hypertension     History of Present Illness: William Moss is a 30 y.o. male with hypertension, diabetes and morbid obesity who presents for follow up.  He was first seen 05/2016 with reports of chest pain. [facility] was [facility]16 that was concerning for anteroapical ischemia. He subsequently underwent left heart catheterization on 06/04/16 that was negative for ischemia. He hs struggled with poorly-controlled blood pressure and non-compliance.  At his last appointment 06/26/16, his blood pressure was 220/111. He had not taken his blood pressure medication. Lisinopril was increased to 20 mg daily. He was asked to return to clinic later that day. His blood pressure and he did not. At that appointment he also reported wrist pain and was started on acetaminophen and tramadol.  He followed up with Azalee CourseHao Meng, PA, on 07/10/16.  At that time his blood pressure was 232/110. He took his medication 10 minutes before the appointment. Lisinopril was increased to 40 mg and he was started on carvedilol 12.5 mg twice daily. He was also referred to her hypertension clinic.  William Moss has been doing well since his last appointment.  He took his medication about 30 minutes ago.  He denies fatigue since starting carvedilol.  He continues to note episodes of chest discomfort that occur randomly.  He also reports exertional shortness of breath but denies lower extremity edema, orthopnea or PND.   Past Medical History:  Diagnosis Date  . Atypical chest pain 05/27/2016  . Diabetes mellitus without complication (HCC)   . Hemorrhoids    ?  Marland Kitchen. Hypertension     Past Surgical History:  Procedure Laterality Date  . CARDIAC CATHETERIZATION N/A 06/10/2016   Procedure: Left Heart Cath  and Coronary Angiography;  Surgeon: Iran OuchMuhammad A Arida, MD;  [facility];  Service: Cardiovascular;  Laterality: N/A;  . COLONOSCOPY N/A 02/04/2016   Procedure: COLONOSCOPY;  Surgeon: Hilarie FredricksonJohn N Perry, MD;  Location: [facility];  Service: Endoscopy;  Laterality: N/A;  . NO PAST SURGERIES       Current Outpatient Prescriptions  Medication Sig Dispense Refill  . acetaminophen (TYLENOL) 500 MG tablet Take 500 mg by mouth as directed. TAKE 1 TO 2 TABLETS BY MOUTH 4 TIMES A DAY AS NEEDED FOR PAIN    . Blood Glucose Monitoring Suppl (TRUE METRIX METER) DEVI 1 each by Does not apply route 3 (three) times daily before meals. 1 Device 0  . carvedilol (COREG) 25 MG tablet Take 1 tablet (25 mg total) by mouth 2 (two) times daily. 60 tablet 3  . glipiZIDE (GLUCOTROL) 10 MG tablet TAKE 1 TABLET BY MOUTH TWICE A DAY BEFORE A MEAL (Patient taking differently: Take 10 mg by mouth 2 (two) times daily before a meal. ) 180 tablet 0  . glucose blood (TRUE METRIX BLOOD GLUCOSE TEST) test strip 3 times daily before meals 100 each 12  . JARDIANCE 10 MG TABS tablet TAKE 1 TABLET EVERY DAY (Patient taking differently: Take 10 mg by mouth every day) 30 tablet 0  . lisinopril (PRINIVIL,ZESTRIL) 40 MG tablet Take 1 tablet (40 mg total) by mouth daily. 30 tablet 3  . metFORMIN (GLUCOPHAGE) 500 MG tablet TAKE 1 TABLET BY MOUTH TWICE A DAY  WITH A MEAL (Patient taking differently: Take 500 mg by mouth twice daily) 180 tablet 0  . traMADol (ULTRAM) 50 MG tablet 1 TO 2 TABLETS BY MOUTH AT BEDTIME AS NEEDED 10 tablet 0  . TRUEPLUS LANCETS 28G MISC 1 each by Does not apply route 3 (three) times daily before meals. 100 each 12  . amLODipine (NORVASC) 5 MG tablet Take 1 tablet (5 mg total) by mouth daily. 180 tablet 3   No current facility-administered medications for this visit.     Allergies:   Patient has no known allergies.    Social History:  The patient  reports that he has never smoked. He has never used  smokeless tobacco. He reports that he does not drink alcohol or use drugs.   Family History:  The patient's family history includes Alzheimer's disease in his paternal grandmother; Breast cancer in his cousin; Cancer in his maternal grandmother; Diabetes in his father and mother.    ROS:  Please see the history of present illness.   Otherwise, review of systems are positive for none.   All other systems are reviewed and negative.    PHYSICAL EXAM: VS:  BP (!) 173/108 (BP Location: Left Arm)   Pulse 88   Ht 5\' 8"  (1.727 m)   Wt (!) 168 kg (370 lb 6.4 oz)   BMI 56.32 kg/m  , BMI Body mass index is 56.32 kg/m. GENERAL:  Well appearing HEENT:  Pupils equal round and reactive, fundi not visualized, oral mucosa unremarkable NECK:  No jugular venous distention, waveform within normal limits, carotid upstroke brisk and symmetric, no bruits, no thyromegaly LYMPHATICS:  No cervical adenopathy LUNGS:  Clear to auscultation bilaterally HEART:  RRR.  PMI not displaced or sustained,S1 and S2 within normal limits, no S3, no S4, no clicks, no rubs, no murmurs ABD:  Flat, positive bowel sounds normal in frequency in pitch, no bruits, no rebound, no guarding, no midline pulsatile mass, no hepatomegaly, no splenomegaly EXT:  2 plus pulses throughout, no edema, no cyanosis no clubbing.   SKIN:  No rashes no nodules NEURO:  Cranial nerves II through XII grossly intact, motor grossly intact throughout.  Strength 5/5 bilateral UEs.  Sensation in tact. PSYCH:  Cognitively intact, oriented to person place and time   EKG:  EKG is not ordered today. The ekg ordered 05/27/16 demonstrates sinus rhythm rate 83 bpm.  Non-specific t wave flattening.   Lexiscan Myoview 06/05/16:  There was no ST segment deviation noted during stress.  Findings consistent with ischemia.  This is a low risk study.  The left ventricular ejection fraction is mildly decreased (45-54%).   Small area of moderate anteroapical  ischemia no infarct  EF 52 %   LHC 06/10/16:  The left ventricular systolic function is normal.  LV end diastolic pressure is mildly elevated.   1. Tortuous but normal coronary arteries. 2. Low LV systolic function and mildly elevated left ventricular end-diastolic pressure. LV visualization was not optimal and the patient had PVCs.  Recent Labs: 01/09/2016: ALT 18 06/10/2016: Hemoglobin 13.6; Platelets 239 07/10/2016: BUN 17; Creat 1.00; Potassium 4.5; Sodium 141    Lipid Panel    Component Value Date/Time   CHOL 163 08/29/2015 0905   TRIG 99 08/29/2015 0905   HDL 47 08/29/2015 0905   CHOLHDL 3.5 08/29/2015 0905   VLDL 20 08/29/2015 0905   LDLCALC 96 08/29/2015 0905      Wt Readings from Last 3 Encounters:  07/28/16 (!) 168 kg (  370 lb 6.4 oz)  07/10/16 (!) 170.4 kg (375 lb 9.6 oz)  06/26/16 (!) 170.1 kg (375 lb)      ASSESSMENT AND PLAN:  # Hypertension: Blood pressure is still elevated but much better.  We will increase carvedilol to 25mg  bid and add amlodipine 5mg  daily.  Continue lisinopril 40mg  daily.  He was encouraged to get a BP cuff for home monitoring.   #Chest pain: William Moss has atypical chest pain that seems like it is more related to GERD than ischemia.  Stress test was negative for ischemia.    Current medicines are reviewed at length with the patient today.  The patient does not have concerns regarding medicines.  The following changes have been made:  no change  Labs/ tests ordered today include: No orders of the defined types were placed in this encounter.    Disposition:   FU with Jethro Radke C. Duke Salvia, MD, [facility] in 2 months.  APP in 2 weeks    This note was written with the assistance of speech recognition software.  Please excuse any transcriptional errors.  Signed, Cali Hope C. Duke Salvia, MD, [facility]  07/28/2016 10:19 AM    [facility]

## 2016-07-28 ENCOUNTER — Ambulatory Visit (INDEPENDENT_AMBULATORY_CARE_PROVIDER_SITE_OTHER): Payer: 59 | Admitting: Cardiovascular Disease

## 2016-07-28 ENCOUNTER — Other Ambulatory Visit: Payer: Self-pay | Admitting: *Deleted

## 2016-07-28 ENCOUNTER — Encounter: Payer: Self-pay | Admitting: Cardiovascular Disease

## 2016-07-28 VITALS — BP 173/108 | HR 88 | Ht 68.0 in | Wt 370.4 lb

## 2016-07-28 DIAGNOSIS — R0789 Other chest pain: Secondary | ICD-10-CM

## 2016-07-28 DIAGNOSIS — I119 Hypertensive heart disease without heart failure: Secondary | ICD-10-CM

## 2016-07-28 MED ORDER — AMLODIPINE BESYLATE 5 MG PO TABS: 5.0000 mg | ORAL_TABLET | Freq: Every day | ORAL | 3 refills | Status: DC

## 2016-07-28 MED ORDER — CARVEDILOL 25 MG PO TABS: 25.0000 mg | ORAL_TABLET | Freq: Two times a day (BID) | ORAL | 3 refills | Status: DC

## 2016-07-28 NOTE — Patient Instructions (Addendum)
Medication Instructions:  INCREASE CARVEDILOL TO 25 MG TWICE A DAY  START AMLODIPINE 5 MG DIALY   Labwork: NONE  Testing/Procedures: NONE  Follow-Up: Your physician recommends that you schedule a follow-up appointment in: 2 WEEKS WITH PA/NP  Your physician recommends that you schedule a follow-up appointment in: 2 MONTH OV  If you need a refill on your cardiac medications before your next appointment, please call your pharmacy.

## 2016-07-29 ENCOUNTER — Telehealth: Payer: Self-pay | Admitting: Cardiovascular Disease

## 2016-07-29 NOTE — Telephone Encounter (Signed)
Left message for patient.  He needs to schedule app with PA/NP week of December 11.

## 2016-08-17 ENCOUNTER — Telehealth: Payer: Self-pay | Admitting: *Deleted

## 2016-08-17 NOTE — Telephone Encounter (Signed)
Scheduled follow up with PA/NP as Dr Duke Salviaandolph requested at last ov

## 2016-08-17 NOTE — Telephone Encounter (Signed)
F/u message ° °Pt returning RN call. Please call back to discuss  °

## 2016-08-17 NOTE — Telephone Encounter (Signed)
Routed to Melinda, LPN 

## 2016-08-17 NOTE — Telephone Encounter (Signed)
Left message to call back, needs PA/NP follow up visit

## 2016-08-18 NOTE — Progress Notes (Signed)
Cardiology Office Note    Date:  08/19/2016   ID:  William Moss, DOB 1986/03/08, MRN [id number]  PCP:  Jaclyn ShaggyEnobong, Amao, MD  Cardiologist: Dr. Duke Salviaandolph   Chief Complaint  Patient presents with  . Follow-up    pt states no chest pain no SOB no edema no dizziness     History of Present Illness:    William Moss is a 30 y.o. male with past medical history of HTN, Type 2 DM, and morbid obesity who presents to the office today for follow-up of elevated blood pressure.   He was initially seen by Dr. Duke Salviaandolph in 05/2016 for evaluation of chest pain which was thought to be atypical for a cardiac etiology. A NST was ordered and showed a small area of moderate anteroapical ischemia. A cardiac catheterization was performed which showed tortuous but normal cors with low LV systolic function and mildly elevated LVEDP. Was last seen by Dr. Duke Salviaandolph on 11/28 for titration of BP medications. BP was 173/108 at the time of his office visit. He was continued on Lisinopril 40mg  daily with Coreg being increased from 12.5mg  BID to 25mg  BID and the addition of Amlodipine 5mg  daily.   In talking with the patient today, he reports doing well. Denies any repeat episodes of chest discomfort or shortness of breath. He does not have a blood pressure cuff at home and does not check this regularly when at the grocery store or pharmacy. He denies any recent headaches or lightheadedness. When starting Amlodipine he felt very fatigued during the day, so he started taking this prior to going to bed and his symptoms improved. Still takes Lisinopril in the AM and Coreg BID. No fatigue with taking Coreg in the AM.     Past Medical History:  Diagnosis Date  . Atypical chest pain 05/27/2016   a. 05/2016: NST showing small area of moderare anteroapical ischemia b. 05/2016: cath showing tortous but normal cors which confirmed a false positive NST.  . Diabetes mellitus without complication (HCC)   . Hypertension     Past  Surgical History:  Procedure Laterality Date  . CARDIAC CATHETERIZATION N/A 06/10/2016   Procedure: Left Heart Cath and Coronary Angiography;  Surgeon: Iran OuchMuhammad A Arida, MD;  Location: [facility];  Service: Cardiovascular;  Laterality: N/A;  . COLONOSCOPY N/A 02/04/2016   Procedure: COLONOSCOPY;  Surgeon: Hilarie FredricksonJohn N Perry, MD;  Location: [facility];  Service: Endoscopy;  Laterality: N/A;  . NO PAST SURGERIES      Current Medications: Outpatient Medications Prior to Visit  Medication Sig Dispense Refill  . acetaminophen (TYLENOL) 500 MG tablet Take 500 mg by mouth as directed. TAKE 1 TO 2 TABLETS BY MOUTH 4 TIMES A DAY AS NEEDED FOR PAIN    . Blood Glucose Monitoring Suppl (TRUE METRIX METER) DEVI 1 each by Does not apply route 3 (three) times daily before meals. 1 Device 0  . carvedilol (COREG) 25 MG tablet Take 1 tablet (25 mg total) by mouth 2 (two) times daily. 60 tablet 3  . glipiZIDE (GLUCOTROL) 10 MG tablet TAKE 1 TABLET BY MOUTH TWICE A DAY BEFORE A MEAL (Patient taking differently: Take 10 mg by mouth 2 (two) times daily before a meal. ) 180 tablet 0  . glucose blood (TRUE METRIX BLOOD GLUCOSE TEST) test strip 3 times daily before meals 100 each 12  . JARDIANCE 10 MG TABS tablet TAKE 1 TABLET EVERY DAY (Patient taking differently: Take 10 mg by mouth every day) 30  tablet 0  . metFORMIN (GLUCOPHAGE) 500 MG tablet TAKE 1 TABLET BY MOUTH TWICE A DAY WITH A MEAL (Patient taking differently: Take 500 mg by mouth twice daily) 180 tablet 0  . traMADol (ULTRAM) 50 MG tablet 1 TO 2 TABLETS BY MOUTH AT BEDTIME AS NEEDED 10 tablet 0  . TRUEPLUS LANCETS 28G MISC 1 each by Does not apply route 3 (three) times daily before meals. 100 each 12  . amLODipine (NORVASC) 5 MG tablet Take 1 tablet (5 mg total) by mouth daily. 90 tablet 3  . lisinopril (PRINIVIL,ZESTRIL) 40 MG tablet Take 1 tablet (40 mg total) by mouth daily. 30 tablet 3   No facility-administered medications prior to visit.       Allergies:   Patient has no known allergies.   Social History   Social History  . Marital status: Single    Spouse name: N/A  . Number of children: 0  . Years of education: N/A   Occupational History  . call center rep    Social History Main Topics  . Smoking status: Never Smoker  . Smokeless tobacco: Never Used  . Alcohol use No  . Drug use: No  . Sexual activity: Not Asked   Other Topics Concern  . None   Social History Narrative  . None     Family History:  The patient's family history includes Alzheimer's disease in his paternal grandmother; Breast cancer in his cousin; Cancer in his maternal grandmother; Diabetes in his father and mother.   Review of Systems:   Please see the history of present illness.     General:  No chills, fever, night sweats or weight changes. Positive for morning fatigue (now resolved). Cardiovascular:  No chest pain, dyspnea on exertion, edema, orthopnea, palpitations, paroxysmal nocturnal dyspnea. Dermatological: No rash, lesions/masses Respiratory: No cough, dyspnea Urologic: No hematuria, dysuria Abdominal:   No nausea, vomiting, diarrhea, bright red blood per rectum, melena, or hematemesis Neurologic:  No visual changes, wkns, changes in mental status. All other systems reviewed and are otherwise negative except as noted above.   Physical Exam:    VS:  BP (!) 156/96   Pulse 86   Ht 5\' 8"  (1.727 m)   Wt (!) 378 lb 6.4 oz (171.6 kg)   SpO2 93%   BMI 57.54 kg/m    General: Young, morbidly obese African American male appearing in no acute distress. Head: Normocephalic, atraumatic, sclera non-icteric, no xanthomas, nares are without discharge.  Neck: No carotid bruits. JVD difficult to assess secondary to body habitus.  Lungs: Respirations regular and unlabored, without wheezes or rales.  Heart: Regular rate and rhythm. No S3 or S4.  No murmur, no rubs, or gallops appreciated. Abdomen: Soft, non-tender, non-distended with  normoactive bowel sounds. No hepatomegaly. No rebound/guarding. No obvious abdominal masses. Msk:  Strength and tone appear normal for age. No joint deformities or effusions. Extremities: No clubbing or cyanosis. No edema.  Distal pedal pulses are 2+ bilaterally. Neuro: Alert and oriented X 3. Moves all extremities spontaneously. No focal deficits noted. Psych:  Responds to questions appropriately with a normal affect. Skin: No rashes or lesions noted  Wt Readings from Last 3 Encounters:  08/19/16 (!) 378 lb 6.4 oz (171.6 kg)  07/28/16 (!) 370 lb 6.4 oz (168 kg)  07/10/16 (!) 375 lb 9.6 oz (170.4 kg)    Studies/Labs Reviewed:   EKG:  EKG is not ordered today.   Recent Labs: 01/09/2016: ALT 18 06/10/2016: Hemoglobin 13.6; Platelets  239 07/10/2016: BUN 17; Creat 1.00; Potassium 4.5; Sodium 141   Lipid Panel    Component Value Date/Time   CHOL 163 08/29/2015 0905   TRIG 99 08/29/2015 0905   HDL 47 08/29/2015 0905   CHOLHDL 3.5 08/29/2015 0905   VLDL 20 08/29/2015 0905   LDLCALC 96 08/29/2015 0905    Additional studies/ records that were reviewed today include:   Cardiac Catheterization: 06/10/2016  The left ventricular systolic function is normal.  LV end diastolic pressure is mildly elevated.   1. Tortuous but normal coronary arteries. 2. Low LV systolic function and mildly elevated left ventricular end-diastolic pressure. LV visualization was not optimal and the patient had PVCs.  Recommendations: False-positive nuclear stress test. Continue treatment of risk factors. Consider an echocardiogram for more accurate assessment of LV systolic/diastolic function and evaluation of pulmonary pressure.  Assessment:    1. Essential hypertension   2. Atypical chest pain   3. Type 2 diabetes mellitus without complication, without long-term current use of insulin (HCC)      Plan:   In order of problems listed above:  1. Essential HTN - BP elevated at 173/108 during  office visit on 07/28/2016. Was continued on Lisinopril 40mg  daily with Coreg being increased from 12.5mg  BID to 25mg  BID and the addition of Amlodipine 5mg  daily.  - he has not been checking his BP at home or at retial stores. Denies any recent episodes of headaches or lightheadedness.  - BP is improved but remains elevated at 156/96 today (rechecked and at 152/94). Will increase Amlodipine to 10mg  daily. Instructed to monitor for lower extremity edema. If unable to tolerate 10mg  dosing, will need to consider the addition of HCTZ or can increase Coreg to 50mg  BID (with weight being > 85kg) but would be hesitant to do this due to worsening fatigue.    2. Atypical Chest Pain -  NST from 05/2016 showed a small area of moderate anteroapical ischemia with cardiac catheterization being performed and showing tortuous but normal cors. Study read as showing low LV systolic function but also mentions LV function was normal later in the report.  - denies any repeat episodes of chest discomfort.  - he does not appear to be volume overloaded on physical exam. Continue BB and ACE-I. Consider echo in the future for clarification regarding EF.    3. Type 2 DM - on Glipizide and Empagliflozin.  - followed by PCP.   Medication Adjustments/Labs and Tests Ordered: Current medicines are reviewed at length with the patient today.  Concerns regarding medicines are outlined above.  Medication changes, Labs and Tests ordered today are listed in the Patient Instructions below.   Patient Instructions  Medication Instructions:  Your physician has recommended you make the following change in your medication:  1) INCREASE Amlodipine to 10mg  daily. Call your pharmacy when you are ready to have it refilled  Lisinopril has been refilled today  Labwork: None ordered  Testing/Procedures: None ordered  Follow-Up: Follow up as planned with Dr. Duke Salviaandolph in Feb 2018  Any Other Special Instructions Will Be Listed Below  (If Applicable).  If you need a refill on your cardiac medications before your next appointment, please call your pharmacy.   Lorri FrederickSigned, Ranetta Armacost M Myrissa Chipley, PA  08/19/2016 11:57 AM    [facility] [address], [address] [place], [place]  [address] Phone: [phone]; Fax: [phone]  [address], [address] [place], [place] [address] Phone: [phone]

## 2016-08-19 ENCOUNTER — Ambulatory Visit (INDEPENDENT_AMBULATORY_CARE_PROVIDER_SITE_OTHER): Payer: 59 | Admitting: Student

## 2016-08-19 ENCOUNTER — Encounter: Payer: Self-pay | Admitting: Student

## 2016-08-19 VITALS — BP 156/96 | HR 86 | Ht 68.0 in | Wt 378.4 lb

## 2016-08-19 DIAGNOSIS — R0789 Other chest pain: Secondary | ICD-10-CM

## 2016-08-19 DIAGNOSIS — E119 Type 2 diabetes mellitus without complications: Secondary | ICD-10-CM

## 2016-08-19 DIAGNOSIS — I10 Essential (primary) hypertension: Secondary | ICD-10-CM

## 2016-08-19 MED ORDER — AMLODIPINE BESYLATE 5 MG PO TABS: 5.0000 mg | ORAL_TABLET | Freq: Every day | ORAL | 3 refills | Status: DC

## 2016-08-19 MED ORDER — AMLODIPINE BESYLATE 10 MG PO TABS: 10.0000 mg | ORAL_TABLET | Freq: Every day | ORAL | 3 refills | Status: DC

## 2016-08-19 MED ORDER — LISINOPRIL 40 MG PO TABS: 40.0000 mg | ORAL_TABLET | Freq: Every day | ORAL | 3 refills | Status: DC

## 2016-08-19 NOTE — Patient Instructions (Addendum)
Medication Instructions:  Your physician has recommended you make the following change in your medication:  1) INCREASE Amlodipine to 10mg  daily. Call your pharmacy when you are ready to have it refilled  Lisinopril has been refilled today   Labwork: None ordered  Testing/Procedures: None ordered  Follow-Up: Follow up as planned with Dr. Duke Salviaandolph in Feb 2018  Any Other Special Instructions Will Be Listed Below (If Applicable).     If you need a refill on your cardiac medications before your next appointment, please call your pharmacy.

## 2016-08-23 ENCOUNTER — Other Ambulatory Visit: Payer: Self-pay | Admitting: Family Medicine

## 2016-08-23 DIAGNOSIS — E119 Type 2 diabetes mellitus without complications: Secondary | ICD-10-CM

## 2016-08-27 ENCOUNTER — Other Ambulatory Visit: Payer: Self-pay | Admitting: *Deleted

## 2016-08-27 ENCOUNTER — Other Ambulatory Visit: Payer: Self-pay | Admitting: Family Medicine

## 2016-08-27 DIAGNOSIS — E119 Type 2 diabetes mellitus without complications: Secondary | ICD-10-CM

## 2016-08-27 MED ORDER — EMPAGLIFLOZIN 10 MG PO TABS: 10.0000 mg | ORAL_TABLET | Freq: Every day | ORAL | 0 refills | Status: DC

## 2016-08-27 MED ORDER — METFORMIN HCL 500 MG PO TABS: ORAL_TABLET | ORAL | 0 refills | Status: DC

## 2016-08-28 ENCOUNTER — Encounter: Payer: Self-pay | Admitting: Family Medicine

## 2016-08-28 ENCOUNTER — Ambulatory Visit: Payer: 59 | Attending: Family Medicine | Admitting: Family Medicine

## 2016-08-28 VITALS — BP 125/86 | HR 91 | Temp 99.0°F | Ht 68.0 in | Wt 382.6 lb

## 2016-08-28 DIAGNOSIS — E119 Type 2 diabetes mellitus without complications: Secondary | ICD-10-CM | POA: Diagnosis not present

## 2016-08-28 DIAGNOSIS — I10 Essential (primary) hypertension: Secondary | ICD-10-CM

## 2016-08-28 DIAGNOSIS — Z7984 Long term (current) use of oral hypoglycemic drugs: Secondary | ICD-10-CM | POA: Insufficient documentation

## 2016-08-28 DIAGNOSIS — Z23 Encounter for immunization: Secondary | ICD-10-CM | POA: Diagnosis not present

## 2016-08-28 LAB — POCT GLYCOSYLATED HEMOGLOBIN (HGB A1C): Hemoglobin A1C: 6.4

## 2016-08-28 LAB — LIPID PANEL
Cholesterol: 164 mg/dL (ref <200)
HDL: 41 mg/dL (ref >40)
LDL Cholesterol: 78 mg/dL (ref <100)
Total CHOL/HDL Ratio: 4 Ratio (ref <5.0)
Triglycerides: 225 mg/dL — ABNORMAL HIGH (ref <150)
VLDL: 45 mg/dL — ABNORMAL HIGH (ref <30)

## 2016-08-28 LAB — COMPLETE METABOLIC PANEL WITH GFR
ALT: 18 U/L (ref 9–46)
AST: 12 U/L (ref 10–40)
Albumin: 4.1 g/dL (ref 3.6–5.1)
Alkaline Phosphatase: 52 U/L (ref 40–115)
BUN: 20 mg/dL (ref 7–25)
CO2: 21 mmol/L (ref 20–31)
Calcium: 9.2 mg/dL (ref 8.6–10.3)
Chloride: 106 mmol/L (ref 98–110)
Creat: 0.92 mg/dL (ref 0.60–1.35)
GFR, Est African American: >89 mL/min (ref >=60)
GFR, Est Non African American: >89 mL/min (ref >=60)
Glucose, Bld: 151 mg/dL — ABNORMAL HIGH (ref 65–99)
Potassium: 4.4 mmol/L (ref 3.5–5.3)
Sodium: 138 mmol/L (ref 135–146)
Total Bilirubin: 0.2 mg/dL (ref 0.2–1.2)
Total Protein: 7 g/dL (ref 6.1–8.1)

## 2016-08-28 LAB — GLUCOSE, POCT (MANUAL RESULT ENTRY): POC Glucose: 143 mg/dl — AB (ref 70–99)

## 2016-08-28 MED ORDER — METFORMIN HCL 500 MG PO TABS: ORAL_TABLET | ORAL | 0 refills | Status: DC

## 2016-08-28 MED ORDER — ATORVASTATIN CALCIUM 20 MG PO TABS: 20.0000 mg | ORAL_TABLET | Freq: Every day | ORAL | 1 refills | Status: DC

## 2016-08-28 MED ORDER — EMPAGLIFLOZIN 10 MG PO TABS: 10.0000 mg | ORAL_TABLET | Freq: Every day | ORAL | 1 refills | Status: DC

## 2016-08-28 MED ORDER — GLIPIZIDE 10 MG PO TABS: ORAL_TABLET | ORAL | 1 refills | Status: DC

## 2016-08-28 MED ORDER — LISINOPRIL 40 MG PO TABS: 40.0000 mg | ORAL_TABLET | Freq: Every day | ORAL | 1 refills | Status: DC

## 2016-08-28 MED ORDER — AMLODIPINE BESYLATE 10 MG PO TABS: 10.0000 mg | ORAL_TABLET | Freq: Every day | ORAL | 1 refills | Status: DC

## 2016-08-28 MED ORDER — CARVEDILOL 25 MG PO TABS: 25.0000 mg | ORAL_TABLET | Freq: Two times a day (BID) | ORAL | 1 refills | Status: DC

## 2016-08-28 MED ORDER — METFORMIN HCL 500 MG PO TABS: ORAL_TABLET | ORAL | 1 refills | Status: DC

## 2016-08-28 NOTE — Progress Notes (Signed)
Subjective:  Patient ID: William Moss, male    DOB: 15-Sep-1985  Age: 30 y.o. MRN: [id number]  CC: Diabetes and Hypertension   HPI William Moss is a 30 year old male with a history of hypertension, type 2 diabetes mellitus (A1c 6.4 today), morbid obesity who comes in for follow-up of his diabetes mellitus.  He has been compliant with his medications up until 2 days ago when he ran out of glipizide. Fasting sugars have been in the 110 to 130 range and he denies hypoglycemia, visual symptoms, numbness in extremities. He has not had a recent eye exam.  He has been compliant with his antihypertensives and low-sodium diet but does not exercise regularly.  He has gained some weight since his last office visit; states he had some deaths in the family which kind of stressed him out. Not up-to-date on a flu shot and denies acute complaints at this time.  Past Medical History:  Diagnosis Date  . Atypical chest pain 05/27/2016   a. 05/2016: NST showing small area of moderare anteroapical ischemia b. 05/2016: cath showing tortous but normal cors which confirmed a false positive NST.  . Diabetes mellitus without complication (HCC)   . Hypertension     Past Surgical History:  Procedure Laterality Date  . CARDIAC CATHETERIZATION N/A 06/10/2016   Procedure: Left Heart Cath and Coronary Angiography;  Surgeon: Iran Ouch, MD;  Location: [facility];  Service: Cardiovascular;  Laterality: N/A;  . COLONOSCOPY N/A 02/04/2016   Procedure: COLONOSCOPY;  Surgeon: Hilarie Fredrickson, MD;  Location: [facility];  Service: Endoscopy;  Laterality: N/A;  . NO PAST SURGERIES      No Known Allergies   Outpatient Medications Prior to Visit  Medication Sig Dispense Refill  . acetaminophen (TYLENOL) 500 MG tablet Take 500 mg by mouth as directed. TAKE 1 TO 2 TABLETS BY MOUTH 4 TIMES A DAY AS NEEDED FOR PAIN    . Blood Glucose Monitoring Suppl (TRUE METRIX METER) DEVI 1 each by Does not apply route 3  (three) times daily before meals. 1 Device 0  . glucose blood (TRUE METRIX BLOOD GLUCOSE TEST) test strip 3 times daily before meals 100 each 12  . TRUEPLUS LANCETS 28G MISC 1 each by Does not apply route 3 (three) times daily before meals. 100 each 12  . amLODipine (NORVASC) 10 MG tablet Take 1 tablet (10 mg total) by mouth daily. 90 tablet 3  . carvedilol (COREG) 25 MG tablet Take 1 tablet (25 mg total) by mouth 2 (two) times daily. 60 tablet 3  . lisinopril (PRINIVIL,ZESTRIL) 40 MG tablet Take 1 tablet (40 mg total) by mouth daily. 90 tablet 3  . metFORMIN (GLUCOPHAGE) 500 MG tablet TAKE 1 TABLET BY MOUTH TWICE A DAY WITH A MEAL 60 tablet 0  . empagliflozin (JARDIANCE) 10 MG TABS tablet Take 10 mg by mouth daily. (Patient not taking: Reported on 08/28/2016) 30 tablet 0  . glipiZIDE (GLUCOTROL) 10 MG tablet TAKE 1 TABLET BY MOUTH TWICE A DAY BEFORE A MEAL (Patient not taking: Reported on 08/28/2016) 60 tablet 0  . traMADol (ULTRAM) 50 MG tablet 1 TO 2 TABLETS BY MOUTH AT BEDTIME AS NEEDED (Patient not taking: Reported on 08/28/2016) 10 tablet 0   No facility-administered medications prior to visit.     ROS Review of Systems  Constitutional: Negative for activity change and appetite change.  HENT: Negative for sinus pressure and sore throat.   Eyes: Negative for visual disturbance.  Respiratory: Negative  for cough, chest tightness and shortness of breath.   Cardiovascular: Negative for chest pain and leg swelling.  Gastrointestinal: Negative for abdominal distention, abdominal pain, constipation and diarrhea.  Endocrine: Negative.   Genitourinary: Negative for dysuria.  Musculoskeletal: Negative for joint swelling and myalgias.  Skin: Negative for rash.  Allergic/Immunologic: Negative.   Neurological: Negative for weakness, light-headedness and numbness.  Psychiatric/Behavioral: Negative for dysphoric mood and suicidal ideas.    Objective:  BP 125/86 (BP Location: Right Arm, Patient  Position: Sitting, Cuff Size: Large)   Pulse 91   Temp 99 F (37.2 C) (Oral)   Ht 5\' 8"  (1.727 m)   Wt (!) 382 lb 9.6 oz (173.5 kg)   SpO2 98%   BMI 58.17 kg/m   BP/Weight 08/28/2016 08/19/2016 07/28/2016  Systolic BP 125 156 173  Diastolic BP 86 96 108  Wt. (Lbs) 382.6 378.4 370.4  BMI 58.17 57.54 56.32      Physical Exam  Constitutional: He is oriented to person, place, and time. He appears well-developed and well-nourished.  Morbidly obese  Cardiovascular: Normal rate, normal heart sounds and intact distal pulses.   No murmur heard. Pulmonary/Chest: Effort normal and breath sounds normal. He has no wheezes. He has no rales. He exhibits no tenderness.  Abdominal: Soft. Bowel sounds are normal. He exhibits no distension and no mass. There is no tenderness.  Musculoskeletal: Normal range of motion.  Neurological: He is alert and oriented to person, place, and time.    Lab Results  Component Value Date   HGBA1C 6.4 08/28/2016    CMP Latest Ref Rng & Units 07/10/2016 06/10/2016 03/18/2016  Glucose 65 - 99 mg/dL 161(W102(H) 960(A123(H) 540(J109(H)  BUN 7 - 25 mg/dL 17 14 16   Creatinine 0.60 - 1.35 mg/dL 8.111.00 9.140.95 7.820.95  Sodium 135 - 146 mmol/L 141 141 138  Potassium 3.5 - 5.3 mmol/L 4.5 4.4 3.8  Chloride 98 - 110 mmol/L 107 110 107  CO2 20 - 31 mmol/L 26 24 25   Calcium 8.6 - 10.3 mg/dL 9.3 9.5 9.3  Total Protein 6.1 - 8.1 g/dL - - -  Total Bilirubin 0.2 - 1.2 mg/dL - - -  Alkaline Phos 40 - 115 U/L - - -  AST 10 - 40 U/L - - -  ALT 9 - 46 U/L - - -    Lipid Panel     Component Value Date/Time   CHOL 163 08/29/2015 0905   TRIG 99 08/29/2015 0905   HDL 47 08/29/2015 0905   CHOLHDL 3.5 08/29/2015 0905   VLDL 20 08/29/2015 0905   LDLCALC 96 08/29/2015 0905    Assessment & Plan:   1. Type 2 diabetes mellitus without complication, without long-term current use of insulin (HCC) Controlled with A1c of 6.4 Continue diabetic diet Initiate statin due to cardiovascular risk - HgB  A1c - Glucose (CBG) - metFORMIN (GLUCOPHAGE) 500 MG tablet; TAKE 1 TABLET BY MOUTH TWICE A DAY WITH A MEAL  Dispense: 180 tablet; Refill: 1 - empagliflozin (JARDIANCE) 10 MG TABS tablet; Take 10 mg by mouth daily.  Dispense: 90 tablet; Refill: 1 - glipiZIDE (GLUCOTROL) 10 MG tablet; TAKE 1 TABLET BY MOUTH TWICE A DAY BEFORE A MEAL  Dispense: 180 tablet; Refill: 1 - atorvastatin (LIPITOR) 20 MG tablet; Take 1 tablet (20 mg total) by mouth daily.  Dispense: 90 tablet; Refill: 1 - COMPLETE METABOLIC PANEL WITH GFR - Lipid panel - Ambulatory referral to Ophthalmology  2. Morbid obesity (HCC) Advised to reduce portion sizes,  increase physical activity  3. Essential hypertension Controlled Low-sodium diet - lisinopril (PRINIVIL,ZESTRIL) 40 MG tablet; Take 1 tablet (40 mg total) by mouth daily.  Dispense: 90 tablet; Refill: 1 - carvedilol (COREG) 25 MG tablet; Take 1 tablet (25 mg total) by mouth 2 (two) times daily.  Dispense: 180 tablet; Refill: 1   Meds ordered this encounter  Medications  . DISCONTD: metFORMIN (GLUCOPHAGE) 500 MG tablet    Sig: TAKE 1 TABLET BY MOUTH TWICE A DAY WITH A MEAL    Dispense:  60 tablet    Refill:  0    Must have office visit for refills  . metFORMIN (GLUCOPHAGE) 500 MG tablet    Sig: TAKE 1 TABLET BY MOUTH TWICE A DAY WITH A MEAL    Dispense:  180 tablet    Refill:  1  . amLODipine (NORVASC) 10 MG tablet    Sig: Take 1 tablet (10 mg total) by mouth daily.    Dispense:  90 tablet    Refill:  1    Disregard the prior transmission.Dosage change. Patient will call when refill is needed.  . empagliflozin (JARDIANCE) 10 MG TABS tablet    Sig: Take 10 mg by mouth daily.    Dispense:  90 tablet    Refill:  1  . glipiZIDE (GLUCOTROL) 10 MG tablet    Sig: TAKE 1 TABLET BY MOUTH TWICE A DAY BEFORE A MEAL    Dispense:  180 tablet    Refill:  1  . lisinopril (PRINIVIL,ZESTRIL) 40 MG tablet    Sig: Take 1 tablet (40 mg total) by mouth daily.    Dispense:  90  tablet    Refill:  1  . carvedilol (COREG) 25 MG tablet    Sig: Take 1 tablet (25 mg total) by mouth 2 (two) times daily.    Dispense:  180 tablet    Refill:  1  . atorvastatin (LIPITOR) 20 MG tablet    Sig: Take 1 tablet (20 mg total) by mouth daily.    Dispense:  90 tablet    Refill:  1    Follow-up: Return in about 3 months (around 11/26/2016) for Follow-up on diabetes mellitus.   Jaclyn ShaggyEnobong Amao MD

## 2016-08-28 NOTE — Progress Notes (Signed)
Ran out of glipizide 2 days ago

## 2016-08-28 NOTE — Patient Instructions (Signed)

## 2016-09-01 ENCOUNTER — Telehealth: Payer: Self-pay

## 2016-09-01 NOTE — Telephone Encounter (Signed)
-----   Message from Jaclyn ShaggyEnobong Amao, MD sent at 09/01/2016 10:54 AM EST ----- He has had a slight increase in triglycerides since last set of labs and OTC fish oil capsules will help as well as low-cholesterol diets, glucose is slightly elevated. Other labs otherwise look good.

## 2016-09-01 NOTE — Telephone Encounter (Signed)
Er contacted patient on his personal cell phone and was able to LVM regarding his lab results.  Patient encouraged to call back with any questions.

## 2016-10-02 ENCOUNTER — Ambulatory Visit (INDEPENDENT_AMBULATORY_CARE_PROVIDER_SITE_OTHER): Payer: 59 | Admitting: Cardiovascular Disease

## 2016-10-02 ENCOUNTER — Encounter: Payer: Self-pay | Admitting: Cardiovascular Disease

## 2016-10-02 VITALS — BP 128/86 | HR 74 | Ht 68.0 in | Wt 379.1 lb

## 2016-10-02 DIAGNOSIS — K219 Gastro-esophageal reflux disease without esophagitis: Secondary | ICD-10-CM | POA: Diagnosis not present

## 2016-10-02 DIAGNOSIS — I10 Essential (primary) hypertension: Secondary | ICD-10-CM

## 2016-10-02 HISTORY — DX: Gastro-esophageal reflux disease without esophagitis: K21.9

## 2016-10-02 MED ORDER — PANTOPRAZOLE SODIUM 40 MG PO TBEC: 40.0000 mg | DELAYED_RELEASE_TABLET | Freq: Every day | ORAL | 11 refills | Status: DC

## 2016-10-02 MED ORDER — PANTOPRAZOLE SODIUM 40 MG PO TBEC: DELAYED_RELEASE_TABLET | ORAL | 0 refills | Status: DC

## 2016-10-02 NOTE — Progress Notes (Signed)
Cardiology Office Note   Date:  10/02/2016   ID:  William Moss, DOB 13-Mar-1986, MRN [id number]  PCP:  Jaclyn ShaggyEnobong, Amao, MD  Cardiologist:   Chilton Siiffany [place], MD   Chief Complaint  Patient presents with  . Follow-up    had some light chest pain, one episode of shortness of breath, no edema, no pain in legs,  no lightheaded or dizziness     History of Present Illness: William Moss is a 31 y.o. male with hypertension, diabetes and morbid obesity who presents for follow up.  He was first seen 05/2016 with reports of chest pain. [facility] that was concerning for anteroapical ischemia. He subsequently underwent left heart catheterization on 06/04/16 that was negative for ischemia. He hs struggled with poorly-controlled blood pressure and non-compliance.  His initial blood pressures in clinic were 200s/100s so he was started on lisinopril and carvedilol and amlodipine was increased. BP was 125/86 when he saw his PCP 08/28/16.  Since his last appointment William Moss has been doing well. Sometimes he notes spasms in his mid chest when he eats. He feels as though something is there. This is sometimes associated with mild shortness of breath. He denies exertional chest pain or shortness of breath. He also denies lower extremity edema, orthopnea, or PND.   Past Medical History:  Diagnosis Date  . Atypical chest pain 05/27/2016   a. 05/2016: NST showing small area of moderare anteroapical ischemia b. 05/2016: cath showing tortous but normal cors which confirmed a false positive NST.  . Diabetes mellitus without complication (HCC)   . GERD (gastroesophageal reflux disease) 10/02/2016  . Hypertension     Past Surgical History:  Procedure Laterality Date  . CARDIAC CATHETERIZATION N/A 06/10/2016   Procedure: Left Heart Cath and Coronary Angiography;  Surgeon: Iran OuchMuhammad A Arida, MD;  Location: [facility];  Service: Cardiovascular;  Laterality: N/A;  . COLONOSCOPY N/A  02/04/2016   Procedure: COLONOSCOPY;  Surgeon: Hilarie FredricksonJohn N Perry, MD;  Location: [facility];  Service: Endoscopy;  Laterality: N/A;  . NO PAST SURGERIES       Current Outpatient Prescriptions  Medication Sig Dispense Refill  . acetaminophen (TYLENOL) 500 MG tablet Take 500 mg by mouth as directed. TAKE 1 TO 2 TABLETS BY MOUTH 4 TIMES A DAY AS NEEDED FOR PAIN    . amLODipine (NORVASC) 10 MG tablet Take 1 tablet (10 mg total) by mouth daily. 90 tablet 1  . atorvastatin (LIPITOR) 20 MG tablet Take 1 tablet (20 mg total) by mouth daily. 90 tablet 1  . Blood Glucose Monitoring Suppl (TRUE METRIX METER) DEVI 1 each by Does not apply route 3 (three) times daily before meals. 1 Device 0  . carvedilol (COREG) 25 MG tablet Take 1 tablet (25 mg total) by mouth 2 (two) times daily. 180 tablet 1  . empagliflozin (JARDIANCE) 10 MG TABS tablet Take 10 mg by mouth daily. 90 tablet 1  . glipiZIDE (GLUCOTROL) 10 MG tablet TAKE 1 TABLET BY MOUTH TWICE A DAY BEFORE A MEAL 180 tablet 1  . glucose blood (TRUE METRIX BLOOD GLUCOSE TEST) test strip 3 times daily before meals 100 each 12  . lisinopril (PRINIVIL,ZESTRIL) 40 MG tablet Take 1 tablet (40 mg total) by mouth daily. 90 tablet 1  . metFORMIN (GLUCOPHAGE) 500 MG tablet TAKE 1 TABLET BY MOUTH TWICE A DAY WITH A MEAL 180 tablet 1  . TRUEPLUS LANCETS 28G MISC 1 each by Does not apply  route 3 (three) times daily before meals. 100 each 12  . pantoprazole (PROTONIX) 40 MG tablet 1 TABLET BY MOUTH TWICE A DAY FOR 2 WEEKS AND THEN DECREASE TO 1 DAILY 45 tablet 0  . pantoprazole (PROTONIX) 40 MG tablet Take 1 tablet (40 mg total) by mouth daily. 30 tablet 11   No current facility-administered medications for this visit.     Allergies:   Patient has no known allergies.    Social History:  The patient  reports that he has never smoked. He has never used smokeless tobacco. He reports that he does not drink alcohol or use drugs.   Family History:  The patient's family  history includes Alzheimer's disease in his paternal grandmother; Breast cancer in his cousin; Cancer in his maternal grandmother; Diabetes in his father and mother.    ROS:  Please see the history of present illness.   Otherwise, review of systems are positive for none.   All other systems are reviewed and negative.    PHYSICAL EXAM: VS:  BP 128/86   Pulse 74   Ht 5\' 8"  (1.727 m)   Wt (!) 172 kg (379 lb 2 oz)   BMI 57.65 kg/m  , BMI Body mass index is 57.65 kg/m. GENERAL:  Well appearing HEENT:  Pupils equal round and reactive, fundi not visualized, oral mucosa unremarkable NECK:  No jugular venous distention, waveform within normal limits, carotid upstroke brisk and symmetric, no bruits, no thyromegaly LYMPHATICS:  No cervical adenopathy LUNGS:  Clear to auscultation bilaterally HEART:  RRR.  PMI not displaced or sustained,S1 and S2 within normal limits, no S3, no S4, no clicks, no rubs, no murmurs ABD:  Flat, positive bowel sounds normal in frequency in pitch, no bruits, no rebound, no guarding, no midline pulsatile mass, no hepatomegaly, no splenomegaly EXT:  2 plus pulses throughout, no edema, no cyanosis no clubbing.   SKIN:  No rashes no nodules NEURO:  Cranial nerves II through XII grossly intact, motor grossly intact throughout.  Strength 5/5 bilateral UEs.  Sensation in tact. PSYCH:  Cognitively intact, oriented to person place and time  EKG:  EKG is ordered today. The ekg ordered 05/27/16 demonstrates sinus rhythm rate 83 bpm.  Non-specific t wave flattening.  10/02/16: Sinus rhythm. Rate 72 bpm. Nonspecific T wave abnormalities.  Lexiscan Myoview 06/05/16:  There was no ST segment deviation noted during stress.  Findings consistent with ischemia.  This is a low risk study.  The left ventricular ejection fraction is mildly decreased (45-54%).   Small area of moderate anteroapical ischemia no infarct  EF 52 %   LHC 06/10/16:  The left ventricular systolic function  is normal.  LV end diastolic pressure is mildly elevated.   1. Tortuous but normal coronary arteries. 2. Low LV systolic function and mildly elevated left ventricular end-diastolic pressure. LV visualization was not optimal and the patient had PVCs.  Recent Labs: 06/10/2016: Hemoglobin 13.6; Platelets 239 08/28/2016: ALT 18; BUN 20; Creat 0.92; Potassium 4.4; Sodium 138    Lipid Panel    Component Value Date/Time   CHOL 164 08/28/2016 1000   TRIG 225 (H) 08/28/2016 1000   HDL 41 08/28/2016 1000   CHOLHDL 4.0 08/28/2016 1000   VLDL 45 (H) 08/28/2016 1000   LDLCALC 78 08/28/2016 1000      Wt Readings from Last 3 Encounters:  10/02/16 (!) 172 kg (379 lb 2 oz)  08/28/16 (!) 173.5 kg (382 lb 9.6 oz)  08/19/16 (!) 171.6 kg (  378 lb 6.4 oz)      ASSESSMENT AND PLAN:  # Hypertension: Blood pressure is Much better-controlled. Continue amlodipine, carvedilol, and lisinopril.  #Chest pain: William Moss has atypical chest pain that seems like it is more related to GERD than ischemia.  Stress test was negative for ischemia.  We will start pantoprazole 40 mg twice daily for 14 days. This is followed by 40 mg daily. We will also refer him to see Dr. Bosie Clos of gastroenterology.     Current medicines are reviewed at length with the patient today.  The patient does not have concerns regarding medicines.  The following changes have been made:  no change  Labs/ tests ordered today include:  Orders Placed This Encounter  Procedures  . EKG 12-Lead     Disposition:   FU with Orva Riles C. Duke Salvia, MD, [facility] in 6 months   This note was written with the assistance of speech recognition software.  Please excuse any transcriptional errors.  Signed, Ezra Marquess C. Duke Salvia, MD, [facility]  10/02/2016 10:19 AM    [facility]

## 2016-10-02 NOTE — Patient Instructions (Addendum)
Medication Instructions:  START PROTONIX 40 MG TWICE A DAY FOR 2 WEEKS AND THEN 1 DAILY  Labwork: NONE  Testing/Procedures: NONE  Follow-Up: Your physician wants you to follow-up in: 6 MONTH OV You will receive a reminder letter in the mail two months in advance. If you don't receive a letter, please call our office to schedule the follow-up appointment.  Any Other Special Instructions Will Be Listed Below (If Applicable). [phone] YOU ARE SCHEDULED ON 10/06/16 ARRIVE AT 10:00 AM FOR A 10:15 AM APPOINTMENT   If you need a refill on your cardiac medications before your next appointment, please call your pharmacy.

## 2016-10-16 ENCOUNTER — Telehealth: Payer: Self-pay | Admitting: Family Medicine

## 2016-10-16 DIAGNOSIS — E0800 Diabetes mellitus due to underlying condition with hyperosmolarity without nonketotic hyperglycemic-hyperosmolar coma (NKHHC): Secondary | ICD-10-CM

## 2016-10-16 NOTE — Telephone Encounter (Signed)
Patient came by the office to drop off documentation for PCP to fill out. Placing documents on PCP's mailbox. Pt will come to pick them up. Pt also needs a new referral eye doctor. Last referral place didn't work out for patient. They didn't accept his insurance. Please follow up.   Thank you.

## 2016-10-21 ENCOUNTER — Encounter: Payer: Self-pay | Admitting: Family Medicine

## 2016-10-21 NOTE — Telephone Encounter (Signed)
Placed referral; will look out for paperwork.

## 2016-10-22 NOTE — Telephone Encounter (Signed)
Writer called patient to inform him that the referral was placed for the eye doctor.  Pateint also informed that MD will be working on his paperwork.  Patient stated understanding.

## 2016-11-03 ENCOUNTER — Telehealth: Payer: Self-pay | Admitting: Family Medicine

## 2016-11-03 NOTE — Telephone Encounter (Signed)
Patient called the office to follow up on the status of his FMLA documentation. Pt needs documents by 3/12.  Thank you.

## 2016-11-03 NOTE — Telephone Encounter (Signed)
Writer called patient back to inquire as to what the FMLA paperwork is being filled out for- writer LVM asking patient to call back and clarify. Paperwork placed back on Dr. Jen MowAmao's desk.

## 2016-11-04 LAB — HM DIABETES EYE EXAM

## 2016-11-04 NOTE — Telephone Encounter (Signed)
My nurse attempted to reach him but failed. I would need to understand what the FMLA paperwork is for in order to complete it appropriately

## 2016-11-04 NOTE — Telephone Encounter (Signed)
Patient returned nurse's call and wanted to inform her that the reason he needs PCP to fill out his  FMLA paperwork is in case patient has to leave work (at anytime) due to an emergency as well as doctor visits. Patient is able to leave without being penalized or written up. Patient stated that this same paperwork was filled out last year due to his medical conditions: diabetes, htn, etc. Please follow up. Thank you

## 2016-11-05 NOTE — Telephone Encounter (Signed)
Writer called patient to let him know that his FMLA paperwork has been completed and he can pick it up at the front desk.  Patient stated understanding.

## 2017-02-22 ENCOUNTER — Other Ambulatory Visit: Payer: Self-pay | Admitting: Family Medicine

## 2017-02-22 DIAGNOSIS — I10 Essential (primary) hypertension: Secondary | ICD-10-CM

## 2017-02-22 DIAGNOSIS — E119 Type 2 diabetes mellitus without complications: Secondary | ICD-10-CM

## 2017-09-03 ENCOUNTER — Telehealth: Payer: Self-pay | Admitting: Family Medicine

## 2017-09-03 ENCOUNTER — Other Ambulatory Visit: Payer: Self-pay | Admitting: Family Medicine

## 2017-09-03 DIAGNOSIS — I10 Essential (primary) hypertension: Secondary | ICD-10-CM

## 2017-09-03 DIAGNOSIS — E119 Type 2 diabetes mellitus without complications: Secondary | ICD-10-CM

## 2017-09-03 NOTE — Telephone Encounter (Signed)
Patient called for appointment.

## 2017-09-09 ENCOUNTER — Other Ambulatory Visit: Payer: Self-pay | Admitting: Family Medicine

## 2017-09-09 DIAGNOSIS — E119 Type 2 diabetes mellitus without complications: Secondary | ICD-10-CM

## 2017-09-09 DIAGNOSIS — I10 Essential (primary) hypertension: Secondary | ICD-10-CM

## 2017-09-17 ENCOUNTER — Encounter: Payer: Self-pay | Admitting: Family Medicine

## 2017-09-17 ENCOUNTER — Ambulatory Visit: Payer: 59 | Attending: Family Medicine | Admitting: Family Medicine

## 2017-09-17 VITALS — BP 194/101 | HR 82 | Temp 98.5°F | Ht 68.0 in | Wt 379.4 lb

## 2017-09-17 DIAGNOSIS — Z760 Encounter for issue of repeat prescription: Secondary | ICD-10-CM | POA: Diagnosis not present

## 2017-09-17 DIAGNOSIS — E119 Type 2 diabetes mellitus without complications: Secondary | ICD-10-CM | POA: Insufficient documentation

## 2017-09-17 DIAGNOSIS — E0800 Diabetes mellitus due to underlying condition with hyperosmolarity without nonketotic hyperglycemic-hyperosmolar coma (NKHHC): Secondary | ICD-10-CM

## 2017-09-17 DIAGNOSIS — Z7984 Long term (current) use of oral hypoglycemic drugs: Secondary | ICD-10-CM | POA: Insufficient documentation

## 2017-09-17 DIAGNOSIS — K219 Gastro-esophageal reflux disease without esophagitis: Secondary | ICD-10-CM | POA: Insufficient documentation

## 2017-09-17 DIAGNOSIS — Z23 Encounter for immunization: Secondary | ICD-10-CM | POA: Diagnosis not present

## 2017-09-17 DIAGNOSIS — I10 Essential (primary) hypertension: Secondary | ICD-10-CM | POA: Diagnosis not present

## 2017-09-17 DIAGNOSIS — Z79899 Other long term (current) drug therapy: Secondary | ICD-10-CM | POA: Diagnosis not present

## 2017-09-17 DIAGNOSIS — R0789 Other chest pain: Secondary | ICD-10-CM | POA: Diagnosis not present

## 2017-09-17 LAB — POCT GLYCOSYLATED HEMOGLOBIN (HGB A1C): Hemoglobin A1C: 7.3

## 2017-09-17 LAB — GLUCOSE, POCT (MANUAL RESULT ENTRY): POC Glucose: 131 mg/dl — AB (ref 70–99)

## 2017-09-17 MED ORDER — ATORVASTATIN CALCIUM 20 MG PO TABS: 20.0000 mg | ORAL_TABLET | Freq: Every day | ORAL | 1 refills | Status: DC

## 2017-09-17 MED ORDER — CARVEDILOL 25 MG PO TABS: 25.0000 mg | ORAL_TABLET | Freq: Two times a day (BID) | ORAL | 1 refills | Status: DC

## 2017-09-17 MED ORDER — LISINOPRIL 40 MG PO TABS: 40.0000 mg | ORAL_TABLET | Freq: Every day | ORAL | 1 refills | Status: DC

## 2017-09-17 MED ORDER — GLIPIZIDE 10 MG PO TABS: ORAL_TABLET | ORAL | 1 refills | Status: DC

## 2017-09-17 MED ORDER — AMLODIPINE BESYLATE 10 MG PO TABS: 10.0000 mg | ORAL_TABLET | Freq: Every day | ORAL | 1 refills | Status: DC

## 2017-09-17 MED ORDER — METFORMIN HCL 500 MG PO TABS: ORAL_TABLET | ORAL | 1 refills | Status: DC

## 2017-09-17 MED ORDER — PANTOPRAZOLE SODIUM 40 MG PO TBEC: 40.0000 mg | DELAYED_RELEASE_TABLET | Freq: Every day | ORAL | 1 refills | Status: DC

## 2017-09-17 NOTE — Progress Notes (Signed)
Subjective:  Patient ID: William Moss, male    DOB: December 02, 1985  Age: 31 y.o. MRN: [id number]  CC: Diabetes and Medication Refill   HPI William Moss is a 32 year old male with a history of hypertension, type 2 diabetes mellitus (A1c 7.3 today), morbid obesity who comes in for follow-up of his diabetes mellitus; he was last seen in the clinic in 07/2016.  He has been compliant with his medications however has been unable to afford Jardiance due to the high co-pay and his A1c is 7.3 which is up from 6.4 previously. His last eye exam was within 10/2016 and he has an upcoming one this year. Denies neuropathy or visual concerns and has had no hypoglycemic episodes.  His blood pressure is severely elevated which he attributes to running out of his antihypertensives.  Denies chest pains, shortness of breath or pedal edema.  He has intermittent chest pains which are parasternal and occur at rest but denies shortness of breath, orthopnea or pedal edema.   Seen by cardiology in 10/2016. Lexiscan stress test from 05/2016 revealed anterolateral ischemia, cardiac cath was negative for CAD. Chest pain was thought to be related to GERD and he was placed on Protonix. He admits to being stressed at work and thinks this could be the source of his chest pain. He has no chest pain, at this time and has no other concerns.  Past Medical History:  Diagnosis Date  . Atypical chest pain 05/27/2016   a. 05/2016: NST showing small area of moderare anteroapical ischemia b. 05/2016: cath showing tortous but normal cors which confirmed a false positive NST.  . Diabetes mellitus without complication ([place])   . GERD (gastroesophageal reflux disease) 10/02/2016  . Hypertension     Past Surgical History:  Procedure Laterality Date  . CARDIAC CATHETERIZATION N/A 06/10/2016   Procedure: Left Heart Cath and Coronary Angiography;  Surgeon: Wellington Hampshire, MD;  Location: [facility];  Service: Cardiovascular;   Laterality: N/A;  . COLONOSCOPY N/A 02/04/2016   Procedure: COLONOSCOPY;  Surgeon: Irene Shipper, MD;  Location: [facility];  Service: Endoscopy;  Laterality: N/A;  . NO PAST SURGERIES      No Known Allergies   Outpatient Medications Prior to Visit  Medication Sig Dispense Refill  . acetaminophen (TYLENOL) 500 MG tablet Take 500 mg by mouth as directed. TAKE 1 TO 2 TABLETS BY MOUTH 4 TIMES A DAY AS NEEDED FOR PAIN    . Blood Glucose Monitoring Suppl (TRUE METRIX METER) DEVI 1 each by Does not apply route 3 (three) times daily before meals. 1 Device 0  . glucose blood (TRUE METRIX BLOOD GLUCOSE TEST) test strip 3 times daily before meals 100 each 12  . TRUEPLUS LANCETS 28G MISC 1 each by Does not apply route 3 (three) times daily before meals. 100 each 12  . atorvastatin (LIPITOR) 20 MG tablet TAKE 1 TABLET BY MOUTH EVERY DAY 90 tablet 1  . carvedilol (COREG) 25 MG tablet TAKE 1 TABLET BY MOUTH TWICE A DAY 180 tablet 1  . glipiZIDE (GLUCOTROL) 10 MG tablet TAKE 1 TABLET BY MOUTH TWICE A DAY BEFORE A MEAL 180 tablet 1  . metFORMIN (GLUCOPHAGE) 500 MG tablet TAKE 1 TABLET BY MOUTH TWICE A DAY WITH A MEAL 180 tablet 1  . amLODipine (NORVASC) 10 MG tablet Take 1 tablet (10 mg total) by mouth daily. 90 tablet 1  . empagliflozin (JARDIANCE) 10 MG TABS tablet Take 10 mg by mouth daily. (Patient not  taking: Reported on 09/17/2017) 90 tablet 1  . lisinopril (PRINIVIL,ZESTRIL) 40 MG tablet Take 1 tablet (40 mg total) by mouth daily. (Patient not taking: Reported on 09/17/2017) 90 tablet 1  . pantoprazole (PROTONIX) 40 MG tablet 1 TABLET BY MOUTH TWICE A DAY FOR 2 WEEKS AND THEN DECREASE TO 1 DAILY (Patient not taking: Reported on 09/17/2017) 45 tablet 0  . pantoprazole (PROTONIX) 40 MG tablet Take 1 tablet (40 mg total) by mouth daily. (Patient not taking: Reported on 09/17/2017) 30 tablet 11   No facility-administered medications prior to visit.     ROS Review of Systems  Constitutional: Negative for  activity change and appetite change.  HENT: Negative for sinus pressure and sore throat.   Eyes: Negative for visual disturbance.  Respiratory: Negative for cough, chest tightness and shortness of breath.   Cardiovascular: Negative for chest pain and leg swelling.  Gastrointestinal: Negative for abdominal distention, abdominal pain, constipation and diarrhea.  Endocrine: Negative.   Genitourinary: Negative for dysuria.  Musculoskeletal: Negative for joint swelling and myalgias.  Skin: Negative for rash.  Allergic/Immunologic: Negative.   Neurological: Negative for weakness, light-headedness and numbness.  Psychiatric/Behavioral: Negative for dysphoric mood and suicidal ideas.    Objective:  BP (!) 194/101   Pulse 82   Temp 98.5 F (36.9 C) (Oral)   Ht _0  (1.727 m)   Wt (!) 379 lb 6.4 oz (172.1 kg)   SpO2 94%   BMI 57.69 kg/m   BP/Weight 09/17/2017 10/02/2016 09/38/1829  Systolic BP 937 169 678  Diastolic BP 938 86 86  Wt. (Lbs) 379.4 379.13 382.6  BMI 57.69 57.65 58.17      Physical Exam  Constitutional: He is oriented to person, place, and time. He appears well-developed and well-nourished.  Morbid obesity  Cardiovascular: Normal rate, normal heart sounds and intact distal pulses.  No murmur heard. Pulmonary/Chest: Effort normal and breath sounds normal. He has no wheezes. He has no rales. He exhibits no tenderness.  Abdominal: Soft. Bowel sounds are normal. He exhibits no distension and no mass. There is no tenderness.  Musculoskeletal: Normal range of motion.  Neurological: He is alert and oriented to person, place, and time.  Skin: Skin is warm and dry.  Psychiatric: He has a normal mood and affect.     Lab Results  Component Value Date   HGBA1C 7.3 09/17/2017    Assessment & Plan:   1. Type 2 diabetes mellitus without complication, without long-term current use of insulin (HCC) Controlled with A1c of 7.3 He has been unable to take Jardiance due to high  co-pay; I would discontinue this as his diabetes is optimized Counseled on Diabetic diet, my plate method, 101 minutes of moderate intensity exercise/week Keep blood sugar logs with fasting goals of 80-120 mg/dl, random of less than 180 and in the event of sugars less than 60 mg/dl or greater than 400 mg/dl please notify the clinic ASAP. It is recommended that you undergo annual eye exams and annual foot exams. Pneumovax is recommended every 5 years before the age of 59 and once for a lifetime at or after the age of 34. - POCT glucose (manual entry) - POCT glycosylated hemoglobin (Hb A1C) - CMP14+EGFR; Future - Lipid panel; Future - Microalbumin/Creatinine Ratio, Urine; Future - atorvastatin (LIPITOR) 20 MG tablet; Take 1 tablet (20 mg total) by mouth daily.  Dispense: 90 tablet; Refill: 1 - glipiZIDE (GLUCOTROL) 10 MG tablet; TAKE 1 TABLET BY MOUTH TWICE A DAY BEFORE A MEAL  Dispense: 180 tablet; Refill: 1 - metFORMIN (GLUCOPHAGE) 500 MG tablet; TAKE 1 TABLET BY MOUTH TWICE A DAY WITH A MEAL  Dispense: 180 tablet; Refill: 1  2. Essential hypertension Severely uncontrolled due to running out of medications Unable to give clonidine in the clinic due to sedating side effects and the patient will be driving himself .Counseled on blood pressure goal of less than 130/80, low-sodium, DASH diet, medication compliance, 150 minutes of moderate intensity exercise per week. Discussed medication compliance, adverse effects. - amLODipine (NORVASC) 10 MG tablet; Take 1 tablet (10 mg total) by mouth daily.  Dispense: 90 tablet; Refill: 1 - carvedilol (COREG) 25 MG tablet; Take 1 tablet (25 mg total) by mouth 2 (two) times daily.  Dispense: 180 tablet; Refill: 1 - lisinopril (PRINIVIL,ZESTRIL) 40 MG tablet; Take 1 tablet (40 mg total) by mouth daily.  Dispense: 90 tablet; Refill: 1  3. Need for influenza vaccination - Flu Vaccine QUAD 36+ mos IM  4. Morbid obesity ([place]) Counseled on reducing portion  sizes, increasing physical activity and exercise  5. Atypical chest pain Continues to have intermittent chest pain Seen by cardiology cardiac workup rules out cardiac etiology Pain could also stem from stress at work - pantoprazole (PROTONIX) 40 MG tablet; Take 1 tablet (40 mg total) by mouth daily.  Dispense: 90 tablet; Refill: 1   Meds ordered this encounter  Medications  . amLODipine (NORVASC) 10 MG tablet    Sig: Take 1 tablet (10 mg total) by mouth daily.    Dispense:  90 tablet    Refill:  1    Disregard the prior transmission.Dosage change. Patient will call when refill is needed.  Marland Kitchen atorvastatin (LIPITOR) 20 MG tablet    Sig: Take 1 tablet (20 mg total) by mouth daily.    Dispense:  90 tablet    Refill:  1  . carvedilol (COREG) 25 MG tablet    Sig: Take 1 tablet (25 mg total) by mouth 2 (two) times daily.    Dispense:  180 tablet    Refill:  1  . glipiZIDE (GLUCOTROL) 10 MG tablet    Sig: TAKE 1 TABLET BY MOUTH TWICE A DAY BEFORE A MEAL    Dispense:  180 tablet    Refill:  1  . lisinopril (PRINIVIL,ZESTRIL) 40 MG tablet    Sig: Take 1 tablet (40 mg total) by mouth daily.    Dispense:  90 tablet    Refill:  1  . metFORMIN (GLUCOPHAGE) 500 MG tablet    Sig: TAKE 1 TABLET BY MOUTH TWICE A DAY WITH A MEAL    Dispense:  180 tablet    Refill:  1  . pantoprazole (PROTONIX) 40 MG tablet    Sig: Take 1 tablet (40 mg total) by mouth daily.    Dispense:  90 tablet    Refill:  1    DO NOT FILL UNTIL PATIENT FINISHES THE TWICE DAILY FOR 2 WEEKS RX    Follow-up: Return in about 3 months (around 12/16/2017) for Follow-up of chronic medical conditions.   Arnoldo Morale MD

## 2017-09-17 NOTE — Patient Instructions (Signed)

## 2017-09-17 NOTE — Progress Notes (Signed)
Pt has been out of BP medication.

## 2017-09-21 ENCOUNTER — Ambulatory Visit: Payer: 59 | Attending: Family Medicine

## 2017-09-21 DIAGNOSIS — E119 Type 2 diabetes mellitus without complications: Secondary | ICD-10-CM | POA: Insufficient documentation

## 2017-09-21 NOTE — Progress Notes (Signed)
Patient here for lab visit  

## 2017-09-21 NOTE — Addendum Note (Signed)
Addended by: Paschal DoppWHITE, VANESSA J on: 09/21/2017 09:12 AM   Modules accepted: Orders

## 2017-09-22 LAB — CMP14+EGFR
ALT: 23 IU/L (ref 0–44)
AST: 13 IU/L (ref 0–40)
Albumin/Globulin Ratio: 1.4 (ref 1.2–2.2)
Albumin: 4.1 g/dL (ref 3.5–5.5)
Alkaline Phosphatase: 60 IU/L (ref 39–117)
BUN/Creatinine Ratio: 16 (ref 9–20)
BUN: 16 mg/dL (ref 6–20)
Bilirubin Total: 0.3 mg/dL (ref 0.0–1.2)
CO2: 25 mmol/L (ref 20–29)
Calcium: 9 mg/dL (ref 8.7–10.2)
Chloride: 105 mmol/L (ref 96–106)
Creatinine, Ser: 0.98 mg/dL (ref 0.76–1.27)
GFR calc Af Amer: 117 mL/min/{1.73_m2} (ref >59)
GFR calc non Af Amer: 101 mL/min/{1.73_m2} (ref >59)
Globulin, Total: 2.9 g/dL (ref 1.5–4.5)
Glucose: 92 mg/dL (ref 65–99)
Potassium: 4.7 mmol/L (ref 3.5–5.2)
Sodium: 142 mmol/L (ref 134–144)
Total Protein: 7 g/dL (ref 6.0–8.5)

## 2017-09-22 LAB — LIPID PANEL
Chol/HDL Ratio: 3 ratio (ref 0.0–5.0)
Cholesterol, Total: 133 mg/dL (ref 100–199)
HDL: 44 mg/dL (ref >39)
LDL Calculated: 62 mg/dL (ref 0–99)
Triglycerides: 135 mg/dL (ref 0–149)
VLDL Cholesterol Cal: 27 mg/dL (ref 5–40)

## 2017-09-22 LAB — MICROALBUMIN / CREATININE URINE RATIO
Creatinine, Urine: 174.7 mg/dL
Microalb/Creat Ratio: 15.1 mg/g creat (ref 0.0–30.0)
Microalbumin, Urine: 26.3 ug/mL

## 2017-09-24 ENCOUNTER — Telehealth: Payer: Self-pay

## 2017-09-24 NOTE — Telephone Encounter (Signed)
Pt was called and informed of lab results. 

## 2017-12-20 ENCOUNTER — Encounter: Payer: Self-pay | Admitting: Family Medicine

## 2017-12-20 ENCOUNTER — Ambulatory Visit: Payer: 59 | Attending: Family Medicine | Admitting: Family Medicine

## 2017-12-20 VITALS — BP 144/92 | HR 83 | Temp 98.6°F | Ht 68.0 in | Wt 381.0 lb

## 2017-12-20 DIAGNOSIS — Z7984 Long term (current) use of oral hypoglycemic drugs: Secondary | ICD-10-CM | POA: Diagnosis not present

## 2017-12-20 DIAGNOSIS — I10 Essential (primary) hypertension: Secondary | ICD-10-CM | POA: Insufficient documentation

## 2017-12-20 DIAGNOSIS — Z6841 Body Mass Index (BMI) 40.0 and over, adult: Secondary | ICD-10-CM | POA: Diagnosis not present

## 2017-12-20 DIAGNOSIS — E0800 Diabetes mellitus due to underlying condition with hyperosmolarity without nonketotic hyperglycemic-hyperosmolar coma (NKHHC): Secondary | ICD-10-CM

## 2017-12-20 DIAGNOSIS — Z79899 Other long term (current) drug therapy: Secondary | ICD-10-CM | POA: Diagnosis not present

## 2017-12-20 DIAGNOSIS — E119 Type 2 diabetes mellitus without complications: Secondary | ICD-10-CM | POA: Insufficient documentation

## 2017-12-20 DIAGNOSIS — K219 Gastro-esophageal reflux disease without esophagitis: Secondary | ICD-10-CM | POA: Diagnosis not present

## 2017-12-20 LAB — POCT GLYCOSYLATED HEMOGLOBIN (HGB A1C): Hemoglobin A1C: 6.7

## 2017-12-20 LAB — GLUCOSE, POCT (MANUAL RESULT ENTRY): POC Glucose: 150 mg/dl — AB (ref 70–99)

## 2017-12-20 MED ORDER — CARVEDILOL 25 MG PO TABS: 25.0000 mg | ORAL_TABLET | Freq: Two times a day (BID) | ORAL | 1 refills | Status: DC

## 2017-12-20 MED ORDER — PANTOPRAZOLE SODIUM 40 MG PO TBEC: 40.0000 mg | DELAYED_RELEASE_TABLET | Freq: Every day | ORAL | 1 refills | Status: DC

## 2017-12-20 MED ORDER — METFORMIN HCL 500 MG PO TABS: ORAL_TABLET | ORAL | 1 refills | Status: DC

## 2017-12-20 MED ORDER — AMLODIPINE BESYLATE 10 MG PO TABS: 10.0000 mg | ORAL_TABLET | Freq: Every day | ORAL | 1 refills | Status: DC

## 2017-12-20 MED ORDER — GLIPIZIDE 10 MG PO TABS: ORAL_TABLET | ORAL | 1 refills | Status: DC

## 2017-12-20 MED ORDER — LISINOPRIL 40 MG PO TABS: 40.0000 mg | ORAL_TABLET | Freq: Every day | ORAL | 1 refills | Status: DC

## 2017-12-20 MED ORDER — ATORVASTATIN CALCIUM 20 MG PO TABS: 20.0000 mg | ORAL_TABLET | Freq: Every day | ORAL | 1 refills | Status: DC

## 2017-12-20 NOTE — Progress Notes (Signed)
Patient is taking Amlodipine.

## 2017-12-20 NOTE — Progress Notes (Signed)
Subjective:  Patient ID: William Moss, male    DOB: 07-30-1986  Age: 32 y.o. MRN: [id number]  CC: Diabetes   HPI William Moss is a 32 year old male with a history of hypertension, type 2 diabetes mellitus (A1c 6.9 today), morbid obesity who comes in for follow-up visit.  His A1c is 6.9 which has improved from 7.3 previously.  He denies visual concerns, hypoglycemia, numbness in extremities.  Not up-to-date on annual eye exam. 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 His blood pressure is slightly elevated and he is yet to take his antihypertensive today. He has no additional concerns today. Reflux symptoms are controlled and and he denies abdominal pain, nausea or vomiting  Past Medical History:  Diagnosis Date  . Atypical chest pain 05/27/2016   a. 05/2016: NST showing small area of moderare anteroapical ischemia b. 05/2016: cath showing tortous but normal cors which confirmed a false positive NST.  . Diabetes mellitus without complication ([facility])   . GERD (gastroesophageal reflux disease) 10/02/2016  . Hypertension     Past Surgical History:  Procedure Laterality Date  . CARDIAC CATHETERIZATION N/A 06/10/2016   Procedure: Left Heart Cath and Coronary Angiography;  Surgeon: William Ouch, MD;  Location: [facility];  Service: Cardiovascular;  Laterality: N/A;  . COLONOSCOPY N/A 02/04/2016   Procedure: COLONOSCOPY;  Surgeon: William Fredrickson, MD;  Location: [facility];  Service: Endoscopy;  Laterality: N/A;  . NO PAST SURGERIES      No Known Allergies   Outpatient Medications Prior to Visit  Medication Sig Dispense Refill  . Blood Glucose Monitoring Suppl (TRUE METRIX METER) DEVI 1 each by Does not apply route 3 (three) times daily before meals. 1 Device 0  . glucose blood (TRUE METRIX BLOOD GLUCOSE TEST) test strip 3 times daily before meals 100 each 12  . TRUEPLUS LANCETS 28G MISC 1 each by Does not  apply route 3 (three) times daily before meals. 100 each 12  . atorvastatin (LIPITOR) 20 MG tablet Take 1 tablet (20 mg total) by mouth daily. 90 tablet 1  . carvedilol (COREG) 25 MG tablet Take 1 tablet (25 mg total) by mouth 2 (two) times daily. 180 tablet 1  . glipiZIDE (GLUCOTROL) 10 MG tablet TAKE 1 TABLET BY MOUTH TWICE A DAY BEFORE A MEAL 180 tablet 1  . lisinopril (PRINIVIL,ZESTRIL) 40 MG tablet Take 1 tablet (40 mg total) by mouth daily. 90 tablet 1  . metFORMIN (GLUCOPHAGE) 500 MG tablet TAKE 1 TABLET BY MOUTH TWICE A DAY WITH A MEAL 180 tablet 1  . pantoprazole (PROTONIX) 40 MG tablet Take 1 tablet (40 mg total) by mouth daily. 90 tablet 1  . acetaminophen (TYLENOL) 500 MG tablet Take 500 mg by mouth as directed. TAKE 1 TO 2 TABLETS BY MOUTH 4 TIMES A DAY AS NEEDED FOR PAIN    . amLODipine (NORVASC) 10 MG tablet Take 1 tablet (10 mg total) by mouth daily. 90 tablet 1   No facility-administered medications prior to visit.     ROS Review of Systems  Constitutional: Negative for activity change and appetite change.  HENT: Negative for sinus pressure and sore throat.   Eyes: Negative for visual disturbance.  Respiratory: Negative for cough, chest tightness, shortness of breath and wheezing.   Cardiovascular: Negative for chest pain, palpitations and leg swelling.  Gastrointestinal: Negative for abdominal distention, abdominal pain, constipation and diarrhea.  Endocrine:  Negative.   Genitourinary: Negative.  Negative for dysuria.  Musculoskeletal: Negative.  Negative for joint swelling and myalgias.  Skin: Negative for rash.  Allergic/Immunologic: Negative.   Neurological: Negative for weakness, light-headedness and numbness.  Psychiatric/Behavioral: Negative for behavioral problems, dysphoric mood and suicidal ideas.    Objective:  BP (!) 144/92   Pulse 83   Temp 98.6 F (37 C) (Oral)   Ht 5\' 8"  (1.727 m)   Wt (!) 381 lb (172.8 kg)   SpO2 95%   BMI 57.93 kg/m    BP/Weight 12/20/2017 09/17/2017 10/02/2016  Systolic BP 144 194 128  Diastolic BP 92 101 86  Wt. (Lbs) 381 379.4 379.13  BMI 57.93 57.69 57.65      Physical Exam  Constitutional: He is oriented to person, place, and time. He appears well-developed and well-nourished.  Obese  Cardiovascular: Normal rate, normal heart sounds and intact distal pulses.  No murmur heard. Pulmonary/Chest: Effort normal and breath sounds normal. He has no wheezes. He has no rales. He exhibits no tenderness.  Abdominal: Soft. Bowel sounds are normal. He exhibits no distension and no mass. There is no tenderness.  Musculoskeletal: Normal range of motion.  Neurological: He is alert and oriented to person, place, and time.  Skin: Skin is warm and dry.  Psychiatric: He has a normal mood and affect.     CMP Latest Ref Rng & Units 09/21/2017 08/28/2016 07/10/2016  Glucose 65 - 99 mg/dL 92 324(M151(H) 010(U102(H)  BUN 6 - 20 mg/dL 16 20 17   Creatinine 0.76 - 1.27 mg/dL 7.250.98 3.660.92 4.401.00  Sodium 134 - 144 mmol/L 142 138 141  Potassium 3.5 - 5.2 mmol/L 4.7 4.4 4.5  Chloride 96 - 106 mmol/L 105 106 107  CO2 20 - 29 mmol/L 25 21 26   Calcium 8.7 - 10.2 mg/dL 9.0 9.2 9.3  Total Protein 6.0 - 8.5 g/dL 7.0 7.0 -  Total Bilirubin 0.0 - 1.2 mg/dL 0.3 0.2 -  Alkaline Phos 39 - 117 IU/L 60 52 -  AST 0 - 40 IU/L 13 12 -  ALT 0 - 44 IU/L 23 18 -    Lipid Panel     Component Value Date/Time   CHOL 133 09/21/2017 0914   TRIG 135 09/21/2017 0914   HDL 44 09/21/2017 0914   CHOLHDL 3.0 09/21/2017 0914   CHOLHDL 4.0 08/28/2016 1000   VLDL 45 (H) 08/28/2016 1000   LDLCALC 62 09/21/2017 0914    Lab Results  Component Value Date   HGBA1C 6.7 12/20/2017    Assessment & Plan:   1. Type 2 diabetes mellitus without complication, without long-term current use of insulin (HCC) Controlled with A1c of 6.7 Diabetic diet, lifestyle modifications - POCT glucose (manual entry) - POCT glycosylated hemoglobin (Hb A1C) - Ambulatory  referral to Ophthalmology - glipiZIDE (GLUCOTROL) 10 MG tablet; TAKE 1 TABLET BY MOUTH TWICE A DAY BEFORE A MEAL  Dispense: 180 tablet; Refill: 1 - metFORMIN (GLUCOPHAGE) 500 MG tablet; TAKE 1 TABLET BY MOUTH TWICE A DAY WITH A MEAL  Dispense: 180 tablet; Refill: 1 - atorvastatin (LIPITOR) 20 MG tablet; Take 1 tablet (20 mg total) by mouth daily.  Dispense: 90 tablet; Refill: 1  2. Essential hypertension Slightly elevated No regimen changes today Low sodium, DASH diet, lifestyle modifications - amLODipine (NORVASC) 10 MG tablet; Take 1 tablet (10 mg total) by mouth daily.  Dispense: 90 tablet; Refill: 1 - carvedilol (COREG) 25 MG tablet; Take 1 tablet (25 mg total) by mouth 2 (  two) times daily.  Dispense: 180 tablet; Refill: 1 - lisinopril (PRINIVIL,ZESTRIL) 40 MG tablet; Take 1 tablet (40 mg total) by mouth daily.  Dispense: 90 tablet; Refill: 1  3. Morbid obesity (HCC) Discussed reducing portion sizes, avoiding late meals and exercising 150 minutes of moderate intensity exercise per week  4. Gastroesophageal reflux disease, esophagitis presence not specified Stable - pantoprazole (PROTONIX) 40 MG tablet; Take 1 tablet (40 mg total) by mouth daily.  Dispense: 90 tablet; Refill: 1   Meds ordered this encounter  Medications  . amLODipine (NORVASC) 10 MG tablet    Sig: Take 1 tablet (10 mg total) by mouth daily.    Dispense:  90 tablet    Refill:  1    Disregard the prior transmission.Dosage change. Patient will call when refill is needed.  . carvedilol (COREG) 25 MG tablet    Sig: Take 1 tablet (25 mg total) by mouth 2 (two) times daily.    Dispense:  180 tablet    Refill:  1  . glipiZIDE (GLUCOTROL) 10 MG tablet    Sig: TAKE 1 TABLET BY MOUTH TWICE A DAY BEFORE A MEAL    Dispense:  180 tablet    Refill:  1  . lisinopril (PRINIVIL,ZESTRIL) 40 MG tablet    Sig: Take 1 tablet (40 mg total) by mouth daily.    Dispense:  90 tablet    Refill:  1  . metFORMIN (GLUCOPHAGE) 500 MG  tablet    Sig: TAKE 1 TABLET BY MOUTH TWICE A DAY WITH A MEAL    Dispense:  180 tablet    Refill:  1  . pantoprazole (PROTONIX) 40 MG tablet    Sig: Take 1 tablet (40 mg total) by mouth daily.    Dispense:  90 tablet    Refill:  1  . atorvastatin (LIPITOR) 20 MG tablet    Sig: Take 1 tablet (20 mg total) by mouth daily.    Dispense:  90 tablet    Refill:  1    Follow-up: Return in about 6 months (around 06/21/2018) for follow up of chronic medical conditions.   Hoy Register MD

## 2017-12-20 NOTE — Patient Instructions (Signed)

## 2018-05-05 ENCOUNTER — Encounter (HOSPITAL_COMMUNITY): Payer: Self-pay | Admitting: *Deleted

## 2018-05-05 ENCOUNTER — Emergency Department (HOSPITAL_COMMUNITY)
Admission: EM | Admit: 2018-05-05 | Discharge: 2018-05-05 | Disposition: A | Discharge status: Home / Self Care | Payer: 59 | Attending: Emergency Medicine | Admitting: Emergency Medicine

## 2018-05-05 DIAGNOSIS — I10 Essential (primary) hypertension: Secondary | ICD-10-CM | POA: Diagnosis not present

## 2018-05-05 DIAGNOSIS — Z7984 Long term (current) use of oral hypoglycemic drugs: Secondary | ICD-10-CM | POA: Diagnosis not present

## 2018-05-05 DIAGNOSIS — Z79899 Other long term (current) drug therapy: Secondary | ICD-10-CM | POA: Insufficient documentation

## 2018-05-05 DIAGNOSIS — E1165 Type 2 diabetes mellitus with hyperglycemia: Secondary | ICD-10-CM | POA: Insufficient documentation

## 2018-05-05 DIAGNOSIS — R739 Hyperglycemia, unspecified: Secondary | ICD-10-CM

## 2018-05-05 DIAGNOSIS — I2510 Atherosclerotic heart disease of native coronary artery without angina pectoris: Secondary | ICD-10-CM | POA: Diagnosis not present

## 2018-05-05 LAB — URINALYSIS, ROUTINE W REFLEX MICROSCOPIC
Bacteria, UA: NONE SEEN
Bilirubin Urine: NEGATIVE
Glucose, UA: >=500 mg/dL — AB
Hgb urine dipstick: NEGATIVE
Ketones, ur: NEGATIVE mg/dL
Leukocytes, UA: NEGATIVE
Nitrite: NEGATIVE
Protein, ur: NEGATIVE mg/dL
Specific Gravity, Urine: 1.031 — ABNORMAL HIGH (ref 1.005–1.030)
pH: 6 (ref 5.0–8.0)

## 2018-05-05 LAB — CBC
HCT: 38.8 % — ABNORMAL LOW (ref 39.0–52.0)
Hemoglobin: 13.2 g/dL (ref 13.0–17.0)
MCH: 28 pg (ref 26.0–34.0)
MCHC: 34 g/dL (ref 30.0–36.0)
MCV: 82.4 fL (ref 78.0–100.0)
Platelets: 244 10*3/uL (ref 150–400)
RBC: 4.71 MIL/uL (ref 4.22–5.81)
RDW: 13.9 % (ref 11.5–15.5)
WBC: 4.9 10*3/uL (ref 4.0–10.5)

## 2018-05-05 LAB — BASIC METABOLIC PANEL
Anion gap: 9 (ref 5–15)
BUN: 14 mg/dL (ref 6–20)
CO2: 27 mmol/L (ref 22–32)
Calcium: 9.3 mg/dL (ref 8.9–10.3)
Chloride: 103 mmol/L (ref 98–111)
Creatinine, Ser: 1.13 mg/dL (ref 0.61–1.24)
GFR calc Af Amer: >60 mL/min (ref >60)
GFR calc non Af Amer: >60 mL/min (ref >60)
Glucose, Bld: 392 mg/dL — ABNORMAL HIGH (ref 70–99)
Potassium: 4 mmol/L (ref 3.5–5.1)
Sodium: 139 mmol/L (ref 135–145)

## 2018-05-05 LAB — CBG MONITORING, ED
Glucose-Capillary: 356 mg/dL — ABNORMAL HIGH (ref 70–99)
Glucose-Capillary: 310 mg/dL — ABNORMAL HIGH (ref 70–99)
Glucose-Capillary: 304 mg/dL — ABNORMAL HIGH (ref 70–99)

## 2018-05-05 MED ORDER — INSULIN ASPART 100 UNIT/ML ~~LOC~~ SOLN
6.0000 [IU] | Freq: Once | SUBCUTANEOUS | Status: AC
  Administered 2018-05-05: 6 [IU] via SUBCUTANEOUS
  Filled 2018-05-05: qty 1

## 2018-05-05 MED ORDER — SODIUM CHLORIDE 0.9 % IV BOLUS
1000.0000 mL | Freq: Once | INTRAVENOUS | Status: AC
  Administered 2018-05-05: 1000 mL via INTRAVENOUS

## 2018-05-05 NOTE — Progress Notes (Signed)
Inpatient Diabetes Program Recommendations  AACE/ADA: New Consensus Statement on Inpatient Glycemic Control (2015)  Target Ranges:  Prepandial:   less than 140 mg/dL      Peak postprandial:   less than 180 mg/dL (1-2 hours)      Critically ill patients:  140 - 180 mg/dL   Lab Results  Component Value Date   GLUCAP 304 (H) 05/05/2018   HGBA1C 6.7 12/20/2017    Review of Glycemic Control  Diabetes history: DM 2 Outpatient Diabetes medications: Glipizide 10 mg BID, Metformin 500 mg BID Current orders for Inpatient glycemic control: Being evaluated in ED   Rounded on patient in the ED. Glucose in 300's. Patient is compliant with medications at home. Patient goes to the [facility] for DM management and has been going there since 2016 when he was diagnosed. Patient reports possibly gaining weight recently. Spoke with patient about diet compliance, portion size, and beverage options. Discussed exercise.   Patient says he is due for another follow up as his last visit was in January and he sees his PCP every 6 months. Spoke with patient about calling for another appointment in the next 1-3 weeks as he will likely need assistance with medication after the ED gets his glucose levels down.  Noted last A1c was 6.7% on 12/20/17.   Thanks,  Christena Deem RN, MSN, BC-ADM Inpatient Diabetes Coordinator Team Pager [phone] (8a-5p)

## 2018-05-05 NOTE — ED Triage Notes (Signed)
Pt states his blood sugar has been over 300 for the past week. Pt's blood sugar was over 400 this morning. Pt states he having blurred vision. Pt has hx of diabetes, states he has been taking medication as prescribed.

## 2018-05-05 NOTE — ED Provider Notes (Signed)
[facility]-EMERGENCY DEPT Provider Note   CSN: [id number] Arrival date & time: 05/05/18  1214     History   Chief Complaint Chief Complaint  Patient presents with  . Hyperglycemia  . Blurred Vision    HPI William Moss is a 32 y.o. male.  He is diabetic and has been on medication for over a year.  He states his sugars are usually between 100-120.  For about a week he is noticed some blurry vision and increased urinary frequency.  He is checked his blood sugars and found them over 300.  Today it was over 400 and so he presented to the emergency department for further evaluation.  He denies any change in his medications and he does not report any particular illness symptoms.  He is had a little bit of low back pain.  He said his stools have been loose but no fever.  He is lost a little weight but has been intentional and not a great amount.  The history is provided by the patient.  Hyperglycemia  Blood sugar level PTA:  400 Severity:  Moderate Onset quality:  Gradual Duration:  1 week Timing:  Constant Progression:  Worsening Chronicity:  New Diabetes status:  Controlled with oral medications Current diabetic therapy:  Metformin and glipizide Context: not change in medication, not new diabetes diagnosis, not noncompliance, not recent change in diet and not recent illness   Relieved by:  Nothing Ineffective treatments:  None tried Associated symptoms: blurred vision, fatigue, increased thirst and weight change   Associated symptoms: no abdominal pain, no altered mental status, no chest pain, no confusion, no dehydration, no dysuria, no fever, no shortness of breath, no syncope and no vomiting     Past Medical History:  Diagnosis Date  . Atypical chest pain 05/27/2016   a. 05/2016: NST showing small area of moderare anteroapical ischemia b. 05/2016: cath showing tortous but normal cors which confirmed a false positive NST.  . Diabetes mellitus without  complication (HCC)   . GERD (gastroesophageal reflux disease) 10/02/2016  . Hypertension     Patient Active Problem List   Diagnosis Date Noted  . GERD (gastroesophageal reflux disease) 10/02/2016  . Coronary artery disease involving native heart   . Abnormal nuclear stress test   . Atypical chest pain 05/27/2016  . Diabetes mellitus (HCC) 09/30/2015  . Hematochezia 09/30/2015  . Hypertension 07/29/2015  . Morbid obesity (HCC) 07/29/2015    Past Surgical History:  Procedure Laterality Date  . CARDIAC CATHETERIZATION N/A 06/10/2016   Procedure: Left Heart Cath and Coronary Angiography;  Surgeon: Iran Ouch, MD;  Location: [facility];  Service: Cardiovascular;  Laterality: N/A;  . COLONOSCOPY N/A 02/04/2016   Procedure: COLONOSCOPY;  Surgeon: Hilarie Fredrickson, MD;  Location: [facility];  Service: Endoscopy;  Laterality: N/A;  . NO PAST SURGERIES          Home Medications    Prior to Admission medications   Medication Sig Start Date End Date Taking? Authorizing Provider  amLODipine (NORVASC) 10 MG tablet Take 1 tablet (10 mg total) by mouth daily. 12/20/17 05/05/18 Yes Hoy Register, MD  atorvastatin (LIPITOR) 20 MG tablet Take 1 tablet (20 mg total) by mouth daily. 12/20/17  Yes Hoy Register, MD  carvedilol (COREG) 25 MG tablet Take 1 tablet (25 mg total) by mouth 2 (two) times daily. 12/20/17  Yes Newlin, Enobong, MD  glipiZIDE (GLUCOTROL) 10 MG tablet TAKE 1 TABLET BY MOUTH TWICE A DAY  BEFORE A MEAL 12/20/17  Yes Hoy Register, MD  lisinopril (PRINIVIL,ZESTRIL) 40 MG tablet Take 1 tablet (40 mg total) by mouth daily. Patient taking differently: Take 40 mg by mouth every evening.  12/20/17  Yes Newlin, Enobong, MD  metFORMIN (GLUCOPHAGE) 500 MG tablet TAKE 1 TABLET BY MOUTH TWICE A DAY WITH A MEAL 12/20/17  Yes Newlin, Enobong, MD  pantoprazole (PROTONIX) 40 MG tablet Take 1 tablet (40 mg total) by mouth daily. 12/20/17  Yes Hoy Register, MD  Blood Glucose Monitoring  Suppl (TRUE METRIX METER) DEVI 1 each by Does not apply route 3 (three) times daily before meals. 07/29/15   Hoy Register, MD  glucose blood (TRUE METRIX BLOOD GLUCOSE TEST) test strip 3 times daily before meals 07/29/15   Hoy Register, MD  TRUEPLUS LANCETS 28G MISC 1 each by Does not apply route 3 (three) times daily before meals. 07/29/15   Hoy Register, MD    Family History Family History  Problem Relation Age of Onset  . Diabetes Mother   . Diabetes Father   . Cancer Maternal Grandmother   . Breast cancer Cousin   . Alzheimer's disease Paternal Grandmother     Social History Social History   Tobacco Use  . Smoking status: Never Smoker  . Smokeless tobacco: Never Used  Substance Use Topics  . Alcohol use: No  . Drug use: No     Allergies   Patient has no known allergies.   Review of Systems Review of Systems  Constitutional: Positive for fatigue. Negative for fever.  HENT: Negative for sore throat.   Eyes: Positive for blurred vision and visual disturbance.  Respiratory: Negative for shortness of breath.   Cardiovascular: Negative for chest pain and syncope.  Gastrointestinal: Negative for abdominal pain and vomiting.  Endocrine: Positive for polydipsia.  Genitourinary: Positive for frequency. Negative for dysuria.  Musculoskeletal: Positive for back pain.  Skin: Negative for rash.  Neurological: Negative for syncope and headaches.  Psychiatric/Behavioral: Negative for confusion.     Physical Exam Updated Vital Signs BP 138/82 (BP Location: Right Arm)   Pulse 76   Temp 98.6 F (37 C) (Oral)   Resp 18   SpO2 94%   Physical Exam  Constitutional: He is oriented to person, place, and time. He appears well-developed and well-nourished.  HENT:  Head: Normocephalic and atraumatic.  Eyes: Pupils are equal, round, and reactive to light. Conjunctivae and EOM are normal.  Neck: Neck supple.  Cardiovascular: Normal rate, regular rhythm and normal heart  sounds.  No murmur heard. Pulmonary/Chest: Effort normal and breath sounds normal. No respiratory distress.  Abdominal: Soft. There is no tenderness.  Musculoskeletal: He exhibits no edema or deformity.  Neurological: He is alert and oriented to person, place, and time.  Skin: Skin is warm and dry. Capillary refill takes less than 2 seconds.  Psychiatric: He has a normal mood and affect.  Nursing note and vitals reviewed.    ED Treatments / Results  Labs (all labs ordered are listed, but only abnormal results are displayed) Labs Reviewed  BASIC METABOLIC PANEL - Abnormal; Notable for the following components:      Result Value   Glucose, Bld 392 (*)    All other components within normal limits  CBC - Abnormal; Notable for the following components:   HCT 38.8 (*)    All other components within normal limits  CBG MONITORING, ED - Abnormal; Notable for the following components:   Glucose-Capillary 356 (*)  All other components within normal limits  URINALYSIS, ROUTINE W REFLEX MICROSCOPIC    EKG None  Radiology No results found.  Procedures Procedures (including critical care time)  Medications Ordered in ED Medications  sodium chloride 0.9 % bolus 1,000 mL (has no administration in time range)  insulin aspart (novoLOG) injection 6 Units (has no administration in time range)     Initial Impression / Assessment and Plan / ED Course  I have reviewed the triage vital signs and the nursing notes.  Pertinent labs & imaging results that were available during my care of the patient were reviewed by me and considered in my medical decision making (see chart for details).  Clinical Course as of May 06 1919  Thu May 05, 2018  [id number] 32 year old male with history of non-insulin-dependent diabetes here with elevated blood sugars.  He has gotten some IV fluids and subcu doses of insulin.  Sugars are trending down.  There is no obvious answers for why his blood sugars are becoming  increasingly high but I think he can be referred back to his primary care doctor for further management.   [MB]    Clinical Course User Index [MB] Terrilee Files, MD      Final Clinical Impressions(s) / ED Diagnoses   Final diagnoses:  Hyperglycemia    ED Discharge Orders    None       Terrilee Files, MD 05/05/18 Jerene Bears

## 2018-05-05 NOTE — Discharge Instructions (Addendum)
Your evaluated in the emergency department for elevated blood sugars.  We checked some blood work and a urinalysis and other than the elevated blood sugar we did not find any obvious abnormalities.  He will need to stay well-hydrated and please call your primary care doctor tomorrow for medication adjustment.  Return if any concerns.

## 2018-06-09 ENCOUNTER — Encounter: Payer: Self-pay | Admitting: Family Medicine

## 2018-06-09 ENCOUNTER — Ambulatory Visit: Payer: 59 | Attending: Family Medicine | Admitting: Family Medicine

## 2018-06-09 VITALS — BP 130/85 | HR 63 | Temp 98.1°F | Ht 68.0 in | Wt 373.6 lb

## 2018-06-09 DIAGNOSIS — I10 Essential (primary) hypertension: Secondary | ICD-10-CM | POA: Insufficient documentation

## 2018-06-09 DIAGNOSIS — E119 Type 2 diabetes mellitus without complications: Secondary | ICD-10-CM | POA: Insufficient documentation

## 2018-06-09 DIAGNOSIS — Z9889 Other specified postprocedural states: Secondary | ICD-10-CM | POA: Insufficient documentation

## 2018-06-09 DIAGNOSIS — Z79899 Other long term (current) drug therapy: Secondary | ICD-10-CM | POA: Insufficient documentation

## 2018-06-09 DIAGNOSIS — Z6841 Body Mass Index (BMI) 40.0 and over, adult: Secondary | ICD-10-CM | POA: Insufficient documentation

## 2018-06-09 DIAGNOSIS — K219 Gastro-esophageal reflux disease without esophagitis: Secondary | ICD-10-CM | POA: Insufficient documentation

## 2018-06-09 LAB — POCT GLYCOSYLATED HEMOGLOBIN (HGB A1C): Hemoglobin A1C: 9 % — AB (ref 4.0–5.6)

## 2018-06-09 LAB — GLUCOSE, POCT (MANUAL RESULT ENTRY): POC Glucose: 92 mg/dl (ref 70–99)

## 2018-06-09 MED ORDER — CARVEDILOL 25 MG PO TABS: 25.0000 mg | ORAL_TABLET | Freq: Two times a day (BID) | ORAL | 1 refills | Status: DC

## 2018-06-09 MED ORDER — ATORVASTATIN CALCIUM 20 MG PO TABS: 20.0000 mg | ORAL_TABLET | Freq: Every day | ORAL | 1 refills | Status: DC

## 2018-06-09 MED ORDER — METFORMIN HCL 500 MG PO TABS: 1000.0000 mg | ORAL_TABLET | Freq: Two times a day (BID) | ORAL | 6 refills | Status: DC

## 2018-06-09 MED ORDER — AMLODIPINE BESYLATE 10 MG PO TABS: 10.0000 mg | ORAL_TABLET | Freq: Every day | ORAL | 1 refills | Status: DC

## 2018-06-09 MED ORDER — GLIPIZIDE 10 MG PO TABS: ORAL_TABLET | ORAL | 1 refills | Status: DC

## 2018-06-09 NOTE — Patient Instructions (Signed)

## 2018-06-09 NOTE — Progress Notes (Signed)
Subjective:  Patient ID: William Moss, male    DOB: 08/16/1986  Age: 32 y.o. MRN: [id number]  CC: Diabetes   HPI William Moss is a 33 year old male with a history of hypertension, type 2 diabetes mellitus (A1c 9.0 today), morbid obesity who comes in for follow-up visit. His A1c has trended up to 9.0 from 6.7 previously and interestingly his A1c had previously been between 6.4 and 7.3.  He had an ED visit for hyperglycemia 1 month ago. 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 Ever since his ED visit he has tried to get back on track with his eating and his fasting sugars have been in the 90s to low 100s.  He does not exercise regularly. Denies neuropathy symptoms, visual concerns or hypoglycemia. Tolerating his antihypertensives with no complaints of adverse effects.  Past Medical History:  Diagnosis Date  . Atypical chest pain 05/27/2016   a. 05/2016: NST showing small area of moderare anteroapical ischemia b. 05/2016: cath showing tortous but normal cors which confirmed a false positive NST.  . Diabetes mellitus without complication (HCC)   . GERD (gastroesophageal reflux disease) 10/02/2016  . Hypertension     Past Surgical History:  Procedure Laterality Date  . CARDIAC CATHETERIZATION N/A 06/10/2016   Procedure: Left Heart Cath and Coronary Angiography;  Surgeon: Iran Ouch, MD;  Location: [facility];  Service: Cardiovascular;  Laterality: N/A;  . COLONOSCOPY N/A 02/04/2016   Procedure: COLONOSCOPY;  Surgeon: Hilarie Fredrickson, MD;  Location: [facility];  Service: Endoscopy;  Laterality: N/A;  . NO PAST SURGERIES      No Known Allergies   Outpatient Medications Prior to Visit  Medication Sig Dispense Refill  . Blood Glucose Monitoring Suppl (TRUE METRIX METER) DEVI 1 each by Does not apply route 3 (three) times daily before meals. 1 Device 0  .  glucose blood (TRUE METRIX BLOOD GLUCOSE TEST) test strip 3 times daily before meals 100 each 12  . lisinopril (PRINIVIL,ZESTRIL) 40 MG tablet Take 1 tablet (40 mg total) by mouth daily. (Patient taking differently: Take 40 mg by mouth every evening. ) 90 tablet 1  . pantoprazole (PROTONIX) 40 MG tablet Take 1 tablet (40 mg total) by mouth daily. 90 tablet 1  . TRUEPLUS LANCETS 28G MISC 1 each by Does not apply route 3 (three) times daily before meals. 100 each 12  . atorvastatin (LIPITOR) 20 MG tablet Take 1 tablet (20 mg total) by mouth daily. 90 tablet 1  . carvedilol (COREG) 25 MG tablet Take 1 tablet (25 mg total) by mouth 2 (two) times daily. 180 tablet 1  . glipiZIDE (GLUCOTROL) 10 MG tablet TAKE 1 TABLET BY MOUTH TWICE A DAY BEFORE A MEAL 180 tablet 1  . metFORMIN (GLUCOPHAGE) 500 MG tablet TAKE 1 TABLET BY MOUTH TWICE A DAY WITH A MEAL 180 tablet 1  . amLODipine (NORVASC) 10 MG tablet Take 1 tablet (10 mg total) by mouth daily. 90 tablet 1   No facility-administered medications prior to visit.     ROS Review of Systems  Constitutional: Negative for activity change and appetite change.  HENT: Negative for sinus pressure and sore throat.   Eyes: Negative for visual disturbance.  Respiratory: Negative for cough, chest tightness and shortness of breath.   Cardiovascular: Negative for chest pain and leg swelling.  Gastrointestinal: Negative for abdominal  distention, abdominal pain, constipation and diarrhea.  Endocrine: Negative.   Genitourinary: Negative for dysuria.  Musculoskeletal: Negative for joint swelling and myalgias.  Skin: Negative for rash.  Allergic/Immunologic: Negative.   Neurological: Negative for weakness, light-headedness and numbness.  Psychiatric/Behavioral: Negative for dysphoric mood and suicidal ideas.    Objective:  BP 130/85   Pulse 63   Temp 98.1 F (36.7 C) (Oral)   Ht 5\' 8"  (1.727 m)   Wt (!) 373 lb 9.6 oz (169.5 kg)   SpO2 96%   BMI 56.81 kg/m     BP/Weight 06/09/2018 05/05/2018 12/20/2017  Systolic BP 130 137 144  Diastolic BP 85 81 92  Wt. (Lbs) 373.6 - 381  BMI 56.81 - 57.93      Physical Exam  Constitutional: He is oriented to person, place, and time. He appears well-developed and well-nourished.  Morbidly obese  Cardiovascular: Normal rate, normal heart sounds and intact distal pulses.  No murmur heard. Pulmonary/Chest: Effort normal and breath sounds normal. He has no wheezes. He has no rales. He exhibits no tenderness.  Abdominal: Soft. Bowel sounds are normal. He exhibits no distension and no mass. There is no tenderness.  Musculoskeletal: Normal range of motion.  Neurological: He is alert and oriented to person, place, and time.  Skin: Skin is warm and dry.  Psychiatric: He has a normal mood and affect.    CMP Latest Ref Rng & Units 05/05/2018 09/21/2017 08/28/2016  Glucose 70 - 99 mg/dL 161(W) 92 960(A)  BUN 6 - 20 mg/dL 14 16 20   Creatinine 0.61 - 1.24 mg/dL 5.40 9.81 1.91  Sodium 135 - 145 mmol/L 139 142 138  Potassium 3.5 - 5.1 mmol/L 4.0 4.7 4.4  Chloride 98 - 111 mmol/L 103 105 106  CO2 22 - 32 mmol/L 27 25 21   Calcium 8.9 - 10.3 mg/dL 9.3 9.0 9.2  Total Protein 6.0 - 8.5 g/dL - 7.0 7.0  Total Bilirubin 0.0 - 1.2 mg/dL - 0.3 0.2  Alkaline Phos 39 - 117 IU/L - 60 52  AST 0 - 40 IU/L - 13 12  ALT 0 - 44 IU/L - 23 18    Lipid Panel     Component Value Date/Time   CHOL 133 09/21/2017 0914   TRIG 135 09/21/2017 0914   HDL 44 09/21/2017 0914   CHOLHDL 3.0 09/21/2017 0914   CHOLHDL 4.0 08/28/2016 1000   VLDL 45 (H) 08/28/2016 1000   LDLCALC 62 09/21/2017 0914    Lab Results  Component Value Date   HGBA1C 9.0 (A) 06/09/2018    Assessment & Plan:   1. Type 2 diabetes mellitus without complication, without long-term current use of insulin (HCC) Uncontrolled with A1c of 9.0 which has trended up from 6.7 previously He attributes this to stress eating Would love to commence Victoza due to  cardiovascular benefit and weight loss benefit however he is not inclined to commencing injectable Increased dose of metformin We will review log at next visit and adjust regimen based on A1c Emphasized the need to be compliant with a diabetic diet, lifestyle modifications - POCT glucose (manual entry) - POCT glycosylated hemoglobin (Hb A1C) - metFORMIN (GLUCOPHAGE) 500 MG tablet; Take 2 tablets (1,000 mg total) by mouth 2 (two) times daily with a meal.  Dispense: 120 tablet; Refill: 6 - atorvastatin (LIPITOR) 20 MG tablet; Take 1 tablet (20 mg total) by mouth daily.  Dispense: 90 tablet; Refill: 1 - glipiZIDE (GLUCOTROL) 10 MG tablet; TAKE 1 TABLET BY MOUTH TWICE  A DAY BEFORE A MEAL  Dispense: 180 tablet; Refill: 1  2. Essential hypertension Controlled Counseled on blood pressure goal of less than 130/80, low-sodium, DASH diet, medication compliance, 150 minutes of moderate intensity exercise per week. Discussed medication compliance, adverse effects. - carvedilol (COREG) 25 MG tablet; Take 1 tablet (25 mg total) by mouth 2 (two) times daily.  Dispense: 180 tablet; Refill: 1 - amLODipine (NORVASC) 10 MG tablet; Take 1 tablet (10 mg total) by mouth daily.  Dispense: 90 tablet; Refill: 1  3. Morbid obesity (HCC) Discussed reducing portion sizes, increasing physical activity by walking with the aim of weight loss   Meds ordered this encounter  Medications  . metFORMIN (GLUCOPHAGE) 500 MG tablet    Sig: Take 2 tablets (1,000 mg total) by mouth 2 (two) times daily with a meal.    Dispense:  120 tablet    Refill:  6  . carvedilol (COREG) 25 MG tablet    Sig: Take 1 tablet (25 mg total) by mouth 2 (two) times daily.    Dispense:  180 tablet    Refill:  1  . atorvastatin (LIPITOR) 20 MG tablet    Sig: Take 1 tablet (20 mg total) by mouth daily.    Dispense:  90 tablet    Refill:  1  . amLODipine (NORVASC) 10 MG tablet    Sig: Take 1 tablet (10 mg total) by mouth daily.    Dispense:  90  tablet    Refill:  1    Disregard the prior transmission.Dosage change. Patient will call when refill is needed.  Marland Kitchen glipiZIDE (GLUCOTROL) 10 MG tablet    Sig: TAKE 1 TABLET BY MOUTH TWICE A DAY BEFORE A MEAL    Dispense:  180 tablet    Refill:  1    Follow-up: Return in about 3 months (around 09/09/2018) for Follow-up of diabetes mellitus.   Hoy Register MD

## 2018-07-26 ENCOUNTER — Other Ambulatory Visit: Payer: Self-pay

## 2018-07-26 ENCOUNTER — Emergency Department (HOSPITAL_COMMUNITY)
Admission: EM | Admit: 2018-07-26 | Discharge: 2018-07-26 | Disposition: A | Discharge status: Home / Self Care | Payer: 59 | Attending: Emergency Medicine | Admitting: Emergency Medicine

## 2018-07-26 ENCOUNTER — Emergency Department (HOSPITAL_COMMUNITY): Payer: 59

## 2018-07-26 ENCOUNTER — Encounter (HOSPITAL_COMMUNITY): Payer: Self-pay

## 2018-07-26 DIAGNOSIS — W19XXXA Unspecified fall, initial encounter: Secondary | ICD-10-CM

## 2018-07-26 DIAGNOSIS — Y9239 Other specified sports and athletic area as the place of occurrence of the external cause: Secondary | ICD-10-CM | POA: Insufficient documentation

## 2018-07-26 DIAGNOSIS — M545 Low back pain, unspecified: Secondary | ICD-10-CM

## 2018-07-26 DIAGNOSIS — Y9301 Activity, walking, marching and hiking: Secondary | ICD-10-CM | POA: Diagnosis not present

## 2018-07-26 DIAGNOSIS — S299XXA Unspecified injury of thorax, initial encounter: Secondary | ICD-10-CM | POA: Diagnosis present

## 2018-07-26 DIAGNOSIS — I10 Essential (primary) hypertension: Secondary | ICD-10-CM | POA: Diagnosis not present

## 2018-07-26 DIAGNOSIS — M79662 Pain in left lower leg: Secondary | ICD-10-CM | POA: Diagnosis not present

## 2018-07-26 DIAGNOSIS — W109XXA Fall (on) (from) unspecified stairs and steps, initial encounter: Secondary | ICD-10-CM | POA: Diagnosis not present

## 2018-07-26 DIAGNOSIS — Z7984 Long term (current) use of oral hypoglycemic drugs: Secondary | ICD-10-CM | POA: Insufficient documentation

## 2018-07-26 DIAGNOSIS — M546 Pain in thoracic spine: Secondary | ICD-10-CM

## 2018-07-26 DIAGNOSIS — Z79899 Other long term (current) drug therapy: Secondary | ICD-10-CM | POA: Insufficient documentation

## 2018-07-26 DIAGNOSIS — Y999 Unspecified external cause status: Secondary | ICD-10-CM | POA: Insufficient documentation

## 2018-07-26 DIAGNOSIS — Z6841 Body Mass Index (BMI) 40.0 and over, adult: Secondary | ICD-10-CM | POA: Insufficient documentation

## 2018-07-26 DIAGNOSIS — E119 Type 2 diabetes mellitus without complications: Secondary | ICD-10-CM | POA: Diagnosis not present

## 2018-07-26 MED ORDER — ACETAMINOPHEN 500 MG PO TABS
1000.0000 mg | ORAL_TABLET | Freq: Once | ORAL | Status: AC
  Administered 2018-07-26: 1000 mg via ORAL
  Filled 2018-07-26: qty 2

## 2018-07-26 MED ORDER — CYCLOBENZAPRINE HCL 10 MG PO TABS: 10.0000 mg | ORAL_TABLET | Freq: Two times a day (BID) | ORAL | 0 refills | Status: DC | PRN

## 2018-07-26 NOTE — ED Triage Notes (Signed)
Pt reports back pain following a mechanical fall 3 days ago. He states that the pain starts mid back and radiates down to his L leg. Pain worse with certain movements.  A&Ox4. Ambulatory.

## 2018-07-26 NOTE — ED Notes (Signed)
Patient transported to X-ray 

## 2018-07-26 NOTE — ED Provider Notes (Signed)
[facility] Provider Note   CSN: [id number] Arrival date & time: 07/26/18  1825     History   Chief Complaint Chief Complaint  Patient presents with  . Back Pain    HPI William Moss is a 32 y.o. male with history of GERD, hypertension, diabetes mellitus presents for evaluation of acute onset, constant left-sided pain secondary to fall 3 days ago.  Patient states that he was at an arena for basketball game going down some stairs when he slipped and landed backwards, mostly on the left side.  He denies head injury or loss of consciousness.  No prodrome leading up to the fall.  He reports he was evaluated by medical personnel on the scene but did not seek any additional evaluation at the time.  He reports that he did not feel strongly that he had sustained any significant injury.  He notes a constant aching pain to the left side of the back from the scapula/trapezius region down to the lumbar region.  Pain worsens with bending and certain movements.  He does note occasional pain shooting down the left lower extremity as well as an aching pain to the anterior aspect of the left lower leg.  He reports some ecchymosis to this region.  He denies bowel or bladder incontinence, saddle anesthesia, fevers.  He has been applying ice with some improvement.  The history is provided by the patient.    Past Medical History:  Diagnosis Date  . Atypical chest pain 05/27/2016   a. 05/2016: NST showing small area of moderare anteroapical ischemia b. 05/2016: cath showing tortous but normal cors which confirmed a false positive NST.  . Diabetes mellitus without complication (HCC)   . GERD (gastroesophageal reflux disease) 10/02/2016  . Hypertension     Patient Active Problem List   Diagnosis Date Noted  . GERD (gastroesophageal reflux disease) 10/02/2016  . Coronary artery disease involving native heart   . Abnormal nuclear stress test   . Atypical chest pain  05/27/2016  . Diabetes mellitus (HCC) 09/30/2015  . Hematochezia 09/30/2015  . Hypertension 07/29/2015  . Morbid obesity (HCC) 07/29/2015    Past Surgical History:  Procedure Laterality Date  . CARDIAC CATHETERIZATION N/A 06/10/2016   Procedure: Left Heart Cath and Coronary Angiography;  Surgeon: Iran Ouch, MD;  Location: [facility];  Service: Cardiovascular;  Laterality: N/A;  . COLONOSCOPY N/A 02/04/2016   Procedure: COLONOSCOPY;  Surgeon: Hilarie Fredrickson, MD;  Location: [facility];  Service: Endoscopy;  Laterality: N/A;  . NO PAST SURGERIES          Home Medications    Prior to Admission medications   Medication Sig Start Date End Date Taking? Authorizing Provider  amLODipine (NORVASC) 10 MG tablet Take 1 tablet (10 mg total) by mouth daily. 06/09/18 09/07/18  Hoy Register, MD  atorvastatin (LIPITOR) 20 MG tablet Take 1 tablet (20 mg total) by mouth daily. 06/09/18   Hoy Register, MD  Blood Glucose Monitoring Suppl (TRUE METRIX METER) DEVI 1 each by Does not apply route 3 (three) times daily before meals. 07/29/15   Hoy Register, MD  carvedilol (COREG) 25 MG tablet Take 1 tablet (25 mg total) by mouth 2 (two) times daily. 06/09/18   Hoy Register, MD  cyclobenzaprine (FLEXERIL) 10 MG tablet Take 1 tablet (10 mg total) by mouth 2 (two) times daily as needed for muscle spasms. 07/26/18   Marlayna Bannister A, PA-C  glipiZIDE (GLUCOTROL) 10 MG tablet TAKE  1 TABLET BY MOUTH TWICE A DAY BEFORE A MEAL 06/09/18   Hoy RegisterNewlin, Enobong, MD  glucose blood (TRUE METRIX BLOOD GLUCOSE TEST) test strip 3 times daily before meals 07/29/15   Hoy RegisterNewlin, Enobong, MD  lisinopril (PRINIVIL,ZESTRIL) 40 MG tablet Take 1 tablet (40 mg total) by mouth daily. Patient taking differently: Take 40 mg by mouth every evening.  12/20/17   Hoy RegisterNewlin, Enobong, MD  metFORMIN (GLUCOPHAGE) 500 MG tablet Take 2 tablets (1,000 mg total) by mouth 2 (two) times daily with a meal. 06/09/18   Hoy RegisterNewlin, Enobong, MD    pantoprazole (PROTONIX) 40 MG tablet Take 1 tablet (40 mg total) by mouth daily. 12/20/17   Hoy RegisterNewlin, Enobong, MD  TRUEPLUS LANCETS 28G MISC 1 each by Does not apply route 3 (three) times daily before meals. 07/29/15   Hoy RegisterNewlin, Enobong, MD    Family History Family History  Problem Relation Age of Onset  . Diabetes Mother   . Diabetes Father   . Cancer Maternal Grandmother   . Breast cancer Cousin   . Alzheimer's disease Paternal Grandmother     Social History Social History   Tobacco Use  . Smoking status: Never Smoker  . Smokeless tobacco: Never Used  Substance Use Topics  . Alcohol use: No  . Drug use: No     Allergies   Patient has no known allergies.   Review of Systems Review of Systems  Constitutional: Negative for fever.  Eyes: Negative for visual disturbance.  Respiratory: Negative for shortness of breath.   Cardiovascular: Negative for chest pain.  Gastrointestinal: Negative for abdominal pain, nausea and vomiting.  Musculoskeletal: Positive for back pain.  Neurological: Negative for syncope and headaches.  All other systems reviewed and are negative.    Physical Exam Updated Vital Signs BP (!) 147/103 (BP Location: Right Arm)   Pulse 82   Temp 97.7 F (36.5 C) (Oral)   Resp 19   Ht 5\' 8"  (1.727 m)   Wt (!) 163.3 kg   SpO2 100%   BMI 54.74 kg/m   Physical Exam  Constitutional: He appears well-developed and well-nourished. No distress.  Morbidly obese male, resting comfortably in chair  HENT:  Head: Normocephalic and atraumatic.  No Battle's signs, no raccoon's eyes, no rhinorrhea. No hemotympanum. No deformity, crepitus, or swelling noted.   Eyes: Conjunctivae are normal. Right eye exhibits no discharge. Left eye exhibits no discharge.  Neck: Normal range of motion. Neck supple. No JVD present. No tracheal deviation present.  No midline cervical spine tenderness, right paracervical muscle tenderness in the trapezius distribution noted.  No  deformity, crepitus, or step-off noted.  Cardiovascular: Normal rate, regular rhythm, normal heart sounds and intact distal pulses.  2+ radial and DP/PT pulses bilaterally, Homans sign absent bilaterally, no lower extremity edema, no palpable cords, compartments are soft   Pulmonary/Chest: Effort normal and breath sounds normal.  Abdominal: Soft. Bowel sounds are normal. He exhibits no distension. There is no tenderness. There is no guarding.  Musculoskeletal: Normal range of motion. He exhibits tenderness. He exhibits no edema.  Diffuse tenderness to palpation of the left posterior parathoracic region.  No midline thoracic spine tenderness.  Diffuse midline lumbar spine tenderness and left paralumbar muscle tenderness.  No deformity, crepitus, ecchymosis, or step-off noted.  5/5 strength of BUE and BLE major muscle groups.  Tenderness to palpation of the left anterior shin with some ecchymosis but no deformity noted.  Normal range of motion of joints.  Pain elicited with flexion and extension  of the lumbar spine.  Neurological: He is alert. No sensory deficit.  Fluent speech, no facial droop, sensation intact to soft touch of extremities, normal gait, and patient able to heel walk and toe walk without difficulty.   Skin: Skin is warm and dry. No erythema.  Psychiatric: He has a normal mood and affect. His behavior is normal.  Nursing note and vitals reviewed.    ED Treatments / Results  Labs (all labs ordered are listed, but only abnormal results are displayed) Labs Reviewed - No data to display  EKG None  Radiology Dg Ribs Unilateral W/chest Left  Result Date: 07/26/2018 CLINICAL DATA:  Left rib pain after fall. EXAM: LEFT RIBS AND CHEST - 3+ VIEW COMPARISON:  Radiographs of March 18, 2016. FINDINGS: No fracture or other bone lesions are seen involving the ribs. There is no evidence of pneumothorax or pleural effusion. Both lungs are clear. Heart size and mediastinal contours are within  normal limits. IMPRESSION: Negative. Electronically Signed   By: Lupita Raider, M.D.   On: 07/26/2018 20:11   Dg Lumbar Spine Complete  Result Date: 07/26/2018 CLINICAL DATA:  Low back and left leg pain after fall 3 days ago. EXAM: LUMBAR SPINE - COMPLETE 4+ VIEW COMPARISON:  None. FINDINGS: There is no evidence of lumbar spine fracture. Alignment is normal. Intervertebral disc spaces are maintained. IMPRESSION: Negative. Electronically Signed   By: Lupita Raider, M.D.   On: 07/26/2018 20:12   Dg Tibia/fibula Left  Result Date: 07/26/2018 CLINICAL DATA:  Left leg pain after fall 3 days ago. EXAM: LEFT TIBIA AND FIBULA - 2 VIEW COMPARISON:  None. FINDINGS: There is no evidence of fracture or other focal bone lesions. Soft tissues are unremarkable. IMPRESSION: Negative. Electronically Signed   By: Lupita Raider, M.D.   On: 07/26/2018 20:14    Procedures Procedures (including critical care time)  Medications Ordered in ED Medications  acetaminophen (TYLENOL) tablet 1,000 mg (1,000 mg Oral Given 07/26/18 2014)     Initial Impression / Assessment and Plan / ED Course  I have reviewed the triage vital signs and the nursing notes.  Pertinent labs & imaging results that were available during my care of the patient were reviewed by me and considered in my medical decision making (see chart for details).     Patient with left-sided pain is reproducible on palpation secondary to mechanical fall 3 days ago.  No head injury or loss of consciousness.  He is afebrile, initially quite hypertensive but had not taken his blood pressure medications.  I also did note that the cuff used to measure his initial blood pressure was ill fitting and when a larger cuff was used his blood pressure was far closer to his baseline.  He is neurovascularly intact, ambulatory without difficulty.  Examination somewhat limited due to body habitus though there does not appear to be any evidence of serious head  injury, intra-abdominal, or intra-thoracic injury.  Will obtain x-rays for further evaluation.  Radiographs show no evidence of acute osseous abnormality, pneumothorax, or spine injury.  Patient resting comfortably no apparent distress.  Doubt dissection, cauda equina, or spinal abscess.  No evidence of pneumothorax.  Conservative therapy indicated and discussed with patient.  We did discuss the effect of NSAIDs on hypertension and he understands to be judicious with his use of ibuprofen.  Recommend Tylenol, ice or heat therapy, gentle stretching.  Discussed appropriate use of Flexeril and its side effects.  Recommend follow-up with PCP  for reevaluation of symptoms if they do not improve.  Discussed the possibility of missed fracture diagnosis.  Discussed strict ED return precautions. Pt verbalized understanding of and agreement with plan and is safe for discharge home at this time.   Final Clinical Impressions(s) / ED Diagnoses   Final diagnoses:  Fall, initial encounter  Acute left-sided low back pain without sciatica  Acute left-sided thoracic back pain    ED Discharge Orders         Ordered    cyclobenzaprine (FLEXERIL) 10 MG tablet  2 times daily PRN     07/26/18 2026           Jeanie Sewer, PA-C 07/26/18 2032    Lorre Nick, MD 07/30/18 [phone]

## 2018-07-26 NOTE — Discharge Instructions (Addendum)
1. Medications: Alternate 600 mg of ibuprofen and [phone] mg of Tylenol every 3 hours as needed for pain. Do not exceed 4000 mg of Tylenol daily.  Take ibuprofen with food to avoid upset stomach issues.  You can take Flexeril as needed for muscle spasm up to twice daily but do not drive, drink alcohol, or operate heavy machinery while taking this medicine because it may make you drowsy.  I typically recommend taking this medicine only at night when you are going to sleep.  You can also cut these tablets in half if they make you feel very drowsy. 2. Treatment: rest, drink plenty of fluids, gentle stretching as discussed (see attached or you can YouTube search lumbar physical therapy exercises), alternate ice and heat (or stick with whichever feels best) 20 minutes on 20 minutes off.  3. Follow Up: Please followup with your primary doctor in 3-7 days for discussion of your diagnoses and further evaluation after today's visit; if you do not have a primary care doctor use the resource guide provided to find one;  Return to the ER for worsening back pain, difficulty walking, weakness, severe headaches, loss of bowel or bladder control or other concerning symptoms  If your blood pressure (BP) was elevated on multiple readings during this visit above 130 for the top number or above 80 for the bottom number, please have this repeated by your primary care provider within one month. You can also check your blood pressure when you are out at a pharmacy or grocery store. Many have machines that will check your blood pressure.  If your blood pressure remains elevated, please follow-up with your PCP.

## 2018-09-13 ENCOUNTER — Ambulatory Visit: Payer: 59 | Attending: Family Medicine | Admitting: Family Medicine

## 2018-09-13 ENCOUNTER — Encounter: Payer: Self-pay | Admitting: Family Medicine

## 2018-09-13 VITALS — BP 141/79 | HR 74 | Temp 98.1°F | Ht 68.0 in | Wt 389.8 lb

## 2018-09-13 DIAGNOSIS — M5489 Other dorsalgia: Secondary | ICD-10-CM

## 2018-09-13 DIAGNOSIS — M62838 Other muscle spasm: Secondary | ICD-10-CM | POA: Diagnosis not present

## 2018-09-13 DIAGNOSIS — E119 Type 2 diabetes mellitus without complications: Secondary | ICD-10-CM

## 2018-09-13 DIAGNOSIS — R29818 Other symptoms and signs involving the nervous system: Secondary | ICD-10-CM

## 2018-09-13 DIAGNOSIS — Z23 Encounter for immunization: Secondary | ICD-10-CM | POA: Diagnosis not present

## 2018-09-13 DIAGNOSIS — Z6841 Body Mass Index (BMI) 40.0 and over, adult: Secondary | ICD-10-CM

## 2018-09-13 DIAGNOSIS — E0800 Diabetes mellitus due to underlying condition with hyperosmolarity without nonketotic hyperglycemic-hyperosmolar coma (NKHHC): Secondary | ICD-10-CM

## 2018-09-13 DIAGNOSIS — Z1159 Encounter for screening for other viral diseases: Secondary | ICD-10-CM

## 2018-09-13 DIAGNOSIS — I10 Essential (primary) hypertension: Secondary | ICD-10-CM

## 2018-09-13 DIAGNOSIS — K219 Gastro-esophageal reflux disease without esophagitis: Secondary | ICD-10-CM

## 2018-09-13 LAB — POCT GLYCOSYLATED HEMOGLOBIN (HGB A1C): HbA1c, POC (controlled diabetic range): 6 % (ref 0.0–7.0)

## 2018-09-13 LAB — GLUCOSE, POCT (MANUAL RESULT ENTRY): POC Glucose: 124 mg/dl — AB (ref 70–99)

## 2018-09-13 MED ORDER — AMLODIPINE BESYLATE 10 MG PO TABS: 10.0000 mg | ORAL_TABLET | Freq: Every day | ORAL | 1 refills | Status: DC

## 2018-09-13 MED ORDER — ATORVASTATIN CALCIUM 20 MG PO TABS: 20.0000 mg | ORAL_TABLET | Freq: Every day | ORAL | 1 refills | Status: DC

## 2018-09-13 MED ORDER — CYCLOBENZAPRINE HCL 10 MG PO TABS: 10.0000 mg | ORAL_TABLET | Freq: Two times a day (BID) | ORAL | 0 refills | Status: DC | PRN

## 2018-09-13 MED ORDER — PANTOPRAZOLE SODIUM 40 MG PO TBEC: 40.0000 mg | DELAYED_RELEASE_TABLET | Freq: Every day | ORAL | 1 refills | Status: DC

## 2018-09-13 MED ORDER — GLIPIZIDE 10 MG PO TABS: ORAL_TABLET | ORAL | 1 refills | Status: DC

## 2018-09-13 MED ORDER — METFORMIN HCL 500 MG PO TABS: 1000.0000 mg | ORAL_TABLET | Freq: Two times a day (BID) | ORAL | 1 refills | Status: DC

## 2018-09-13 MED ORDER — CARVEDILOL 25 MG PO TABS: 25.0000 mg | ORAL_TABLET | Freq: Two times a day (BID) | ORAL | 1 refills | Status: DC

## 2018-09-13 NOTE — Progress Notes (Signed)
Subjective:  Patient ID: William Moss, male    DOB: 03-08-1986  Age: 33 y.o. MRN: [id number]  CC: Diabetes   HPI William Moss s a 33 year old male with a history of hypertension, type 2 diabetes mellitus (A1c 6.0 today), morbid obesity who comes in for follow-up visit. He is A1c 6.0 which is down from 9.0 previously and he endorses compliance with his medications and a diabetic diet but does not exercise.  He denies hypoglycemia, visual concerns and is up-to-date on an annual eye exam. He endorses daytime somnolence and fatigue and wakes up intermittently at night and also snores.  He has never had a sleep study and is unsure if he has sleep apnea. 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  Pain does not radiate and is rated as mild.  Past Medical History:  Diagnosis Date  . Atypical chest pain 05/27/2016   a. 05/2016: NST showing small area of moderare anteroapical ischemia b. 05/2016: cath showing tortous but normal cors which confirmed a false positive NST.  . Diabetes mellitus without complication ([place])   . GERD (gastroesophageal reflux disease) 10/02/2016  . Hypertension     Past Surgical History:  Procedure Laterality Date  . CARDIAC CATHETERIZATION N/A 06/10/2016   Procedure: Left Heart Cath and Coronary Angiography;  Surgeon: Wellington Hampshire, MD;  Location: [place] [facility];  Service: Cardiovascular;  Laterality: N/A;  . COLONOSCOPY N/A 02/04/2016   Procedure: COLONOSCOPY;  Surgeon: Irene Shipper, MD;  Location: [facility];  Service: Endoscopy;  Laterality: N/A;  . NO PAST SURGERIES      No Known Allergies   Outpatient Medications Prior to Visit  Medication Sig Dispense Refill  . Blood Glucose Monitoring Suppl (TRUE METRIX METER) DEVI 1 each by Does not apply route 3 (three) times daily before meals. 1 Device 0  . glucose blood (TRUE METRIX BLOOD GLUCOSE TEST) test strip 3 times daily  before meals 100 each 12  . lisinopril (PRINIVIL,ZESTRIL) 40 MG tablet Take 1 tablet (40 mg total) by mouth daily. (Patient taking differently: Take 40 mg by mouth every evening. ) 90 tablet 1  . TRUEPLUS LANCETS 28G MISC 1 each by Does not apply route 3 (three) times daily before meals. 100 each 12  . atorvastatin (LIPITOR) 20 MG tablet Take 1 tablet (20 mg total) by mouth daily. 90 tablet 1  . carvedilol (COREG) 25 MG tablet Take 1 tablet (25 mg total) by mouth 2 (two) times daily. 180 tablet 1  . cyclobenzaprine (FLEXERIL) 10 MG tablet Take 1 tablet (10 mg total) by mouth 2 (two) times daily as needed for muscle spasms. 10 tablet 0  . glipiZIDE (GLUCOTROL) 10 MG tablet TAKE 1 TABLET BY MOUTH TWICE A DAY BEFORE A MEAL 180 tablet 1  . metFORMIN (GLUCOPHAGE) 500 MG tablet Take 2 tablets (1,000 mg total) by mouth 2 (two) times daily with a meal. 120 tablet 6  . pantoprazole (PROTONIX) 40 MG tablet Take 1 tablet (40 mg total) by mouth daily. 90 tablet 1  . amLODipine (NORVASC) 10 MG tablet Take 1 tablet (10 mg total) by mouth daily. 90 tablet 1   No facility-administered medications prior to visit.     ROS Review of Systems  Constitutional: Negative for activity change and appetite change.  HENT: Negative for sinus pressure and sore throat.   Eyes: Negative for visual disturbance.  Respiratory: Negative for cough, chest  tightness and shortness of breath.   Cardiovascular: Negative for chest pain and leg swelling.  Gastrointestinal: Negative for abdominal distention, abdominal pain, constipation and diarrhea.  Endocrine: Negative.   Genitourinary: Negative for dysuria.  Musculoskeletal: Positive for back pain. Negative for joint swelling and myalgias.  Skin: Negative for rash.  Allergic/Immunologic: Negative.   Neurological: Negative for weakness, light-headedness and numbness.  Psychiatric/Behavioral: Negative for dysphoric mood and suicidal ideas.    Objective:  BP (!) 141/79   Pulse  74   Temp 98.1 F (36.7 C) (Oral)   Ht '5\' 8"'$  (1.727 m)   Wt (!) 389 lb 12.8 oz (176.8 kg)   SpO2 96%   BMI 59.27 kg/m   BP/Weight 09/13/2018 07/26/2018 61/44/3154  Systolic BP 008 676 195  Diastolic BP 79 093 85  Wt. (Lbs) 389.8 360 373.6  BMI 59.27 54.74 56.81      Physical Exam Constitutional:      Appearance: He is well-developed.  Cardiovascular:     Rate and Rhythm: Normal rate.     Heart sounds: Normal heart sounds. No murmur.  Pulmonary:     Effort: Pulmonary effort is normal.     Breath sounds: Normal breath sounds. No wheezing or rales.  Chest:     Chest wall: No tenderness.  Abdominal:     General: Bowel sounds are normal. There is no distension.     Palpations: Abdomen is soft. There is no mass.     Tenderness: There is no abdominal tenderness.  Musculoskeletal: Normal range of motion.        General: No tenderness.  Neurological:     Mental Status: He is alert and oriented to person, place, and time.  Psychiatric:        Mood and Affect: Mood normal.        Behavior: Behavior normal.     Lab Results  Component Value Date   HGBA1C 6.0 09/13/2018    Assessment & Plan:   1. Type 2 diabetes mellitus without complication, without long-term current use of insulin (HCC) Controlled with A1c of 6.0 which has trended down from 9.0 Continue current management - POCT glucose (manual entry) - POCT glycosylated hemoglobin (Hb A1C) - CMP14+EGFR - Lipid panel - Microalbumin/Creatinine Ratio, Urine - glipiZIDE (GLUCOTROL) 10 MG tablet; TAKE 1 TABLET BY MOUTH TWICE A DAY BEFORE A MEAL  Dispense: 180 tablet; Refill: 1 - metFORMIN (GLUCOPHAGE) 500 MG tablet; Take 2 tablets (1,000 mg total) by mouth 2 (two) times daily with a meal.  Dispense: 360 tablet; Refill: 1 - atorvastatin (LIPITOR) 20 MG tablet; Take 1 tablet (20 mg total) by mouth daily.  Dispense: 90 tablet; Refill: 1  2. Muscle spasm Advised to apply heat May be due to his prolonged sitting at work Use  cyclobenzaprine  cyclobenzaprine (FLEXERIL) 10 MG tablet; Take 1 tablet (10 mg total) by mouth 2 (two) times daily as needed for muscle spasms.  Dispense: 20 tablet; Refill: 0    3. Suspected sleep apnea - Split night study; Future  4. Morbid obesity ([place]) His portion sizes, increase physical activity  5. Gastroesophageal reflux disease, esophagitis presence not specified Controlled - pantoprazole (PROTONIX) 40 MG tablet; Take 1 tablet (40 mg total) by mouth daily.  Dispense: 90 tablet; Refill: 1  6. Essential hypertension Slightly above goal No regimen change today Counseled on blood pressure goal of less than 130/80, low-sodium, DASH diet, medication compliance, 150 minutes of moderate intensity exercise per week. Discussed medication compliance, adverse effects. -  amLODipine (NORVASC) 10 MG tablet; Take 1 tablet (10 mg total) by mouth daily.  Dispense: 90 tablet; Refill: 1 - carvedilol (COREG) 25 MG tablet; Take 1 tablet (25 mg total) by mouth 2 (two) times daily.  Dispense: 180 tablet; Refill: 1  7. Screening for viral disease - HIV Antibody (routine testing w rflx)  8. Need for immunization against influenza - Flu Vaccine QUAD 36+ mos IM   Meds ordered this encounter  Medications  . cyclobenzaprine (FLEXERIL) 10 MG tablet    Sig: Take 1 tablet (10 mg total) by mouth 2 (two) times daily as needed for muscle spasms.    Dispense:  20 tablet    Refill:  0  . glipiZIDE (GLUCOTROL) 10 MG tablet    Sig: TAKE 1 TABLET BY MOUTH TWICE A DAY BEFORE A MEAL    Dispense:  180 tablet    Refill:  1  . metFORMIN (GLUCOPHAGE) 500 MG tablet    Sig: Take 2 tablets (1,000 mg total) by mouth 2 (two) times daily with a meal.    Dispense:  360 tablet    Refill:  1  . pantoprazole (PROTONIX) 40 MG tablet    Sig: Take 1 tablet (40 mg total) by mouth daily.    Dispense:  90 tablet    Refill:  1  . amLODipine (NORVASC) 10 MG tablet    Sig: Take 1 tablet (10 mg total) by mouth daily.     Dispense:  90 tablet    Refill:  1    Disregard the prior transmission.Dosage change. Patient will call when refill is needed.  Marland Kitchen atorvastatin (LIPITOR) 20 MG tablet    Sig: Take 1 tablet (20 mg total) by mouth daily.    Dispense:  90 tablet    Refill:  1  . carvedilol (COREG) 25 MG tablet    Sig: Take 1 tablet (25 mg total) by mouth 2 (two) times daily.    Dispense:  180 tablet    Refill:  1    Follow-up: Return in about 6 months (around 03/14/2019) for Follow-up of chronic medical conditions.   Charlott Rakes MD

## 2018-09-14 LAB — CMP14+EGFR
ALT: 14 IU/L (ref 0–44)
AST: 10 IU/L (ref 0–40)
Albumin/Globulin Ratio: 1.6 (ref 1.2–2.2)
Albumin: 4.2 g/dL (ref 3.5–5.5)
Alkaline Phosphatase: 59 IU/L (ref 39–117)
BUN/Creatinine Ratio: 13 (ref 9–20)
BUN: 14 mg/dL (ref 6–20)
Bilirubin Total: 0.3 mg/dL (ref 0.0–1.2)
CO2: 23 mmol/L (ref 20–29)
Calcium: 9.3 mg/dL (ref 8.7–10.2)
Chloride: 106 mmol/L (ref 96–106)
Creatinine, Ser: 1.11 mg/dL (ref 0.76–1.27)
GFR calc Af Amer: 100 mL/min/{1.73_m2} (ref >59)
GFR calc non Af Amer: 86 mL/min/{1.73_m2} (ref >59)
Globulin, Total: 2.7 g/dL (ref 1.5–4.5)
Glucose: 120 mg/dL — ABNORMAL HIGH (ref 65–99)
Potassium: 4.8 mmol/L (ref 3.5–5.2)
Sodium: 144 mmol/L (ref 134–144)
Total Protein: 6.9 g/dL (ref 6.0–8.5)

## 2018-09-14 LAB — HIV ANTIBODY (ROUTINE TESTING W REFLEX): HIV Screen 4th Generation wRfx: NONREACTIVE

## 2018-09-14 LAB — LIPID PANEL
Chol/HDL Ratio: 3.2 ratio (ref 0.0–5.0)
Cholesterol, Total: 137 mg/dL (ref 100–199)
HDL: 43 mg/dL (ref >39)
LDL Calculated: 76 mg/dL (ref 0–99)
Triglycerides: 92 mg/dL (ref 0–149)
VLDL Cholesterol Cal: 18 mg/dL (ref 5–40)

## 2018-09-16 ENCOUNTER — Telehealth: Payer: Self-pay

## 2018-09-16 NOTE — Telephone Encounter (Signed)
Patient was called and informed of lab results. 

## 2018-09-16 NOTE — Telephone Encounter (Signed)
-----   Message from Hoy RegisterEnobong Newlin, MD sent at 09/14/2018  1:12 PM EST ----- Labs are normal

## 2018-11-21 ENCOUNTER — Telehealth: Payer: Self-pay | Admitting: Family Medicine

## 2018-11-21 NOTE — Telephone Encounter (Signed)
Patient called stating he was given time off from his work place due to the CODIV-19 and  because he has DM. Patient would like a letter form PCP stating that he can return to work on 3/31. Please f/u if this is possible.

## 2018-11-22 NOTE — Telephone Encounter (Signed)
Will route to PCP 

## 2018-11-22 NOTE — Telephone Encounter (Signed)
Patient was called and informed of letter being mailed out to address on file.

## 2018-12-09 ENCOUNTER — Encounter (HOSPITAL_BASED_OUTPATIENT_CLINIC_OR_DEPARTMENT_OTHER): Payer: 59

## 2019-01-18 ENCOUNTER — Encounter (HOSPITAL_COMMUNITY): Payer: Self-pay | Admitting: Emergency Medicine

## 2019-01-18 ENCOUNTER — Emergency Department (HOSPITAL_COMMUNITY)
Admission: EM | Admit: 2019-01-18 | Discharge: 2019-01-18 | Disposition: A | Discharge status: Home / Self Care | Payer: 59 | Attending: Emergency Medicine | Admitting: Emergency Medicine

## 2019-01-18 ENCOUNTER — Emergency Department (HOSPITAL_COMMUNITY): Payer: 59

## 2019-01-18 ENCOUNTER — Other Ambulatory Visit: Payer: Self-pay

## 2019-01-18 DIAGNOSIS — Z79899 Other long term (current) drug therapy: Secondary | ICD-10-CM | POA: Diagnosis not present

## 2019-01-18 DIAGNOSIS — I2510 Atherosclerotic heart disease of native coronary artery without angina pectoris: Secondary | ICD-10-CM | POA: Diagnosis not present

## 2019-01-18 DIAGNOSIS — Z7984 Long term (current) use of oral hypoglycemic drugs: Secondary | ICD-10-CM | POA: Insufficient documentation

## 2019-01-18 DIAGNOSIS — E1165 Type 2 diabetes mellitus with hyperglycemia: Secondary | ICD-10-CM | POA: Insufficient documentation

## 2019-01-18 DIAGNOSIS — Z1159 Encounter for screening for other viral diseases: Secondary | ICD-10-CM | POA: Diagnosis not present

## 2019-01-18 DIAGNOSIS — I10 Essential (primary) hypertension: Secondary | ICD-10-CM | POA: Insufficient documentation

## 2019-01-18 DIAGNOSIS — R739 Hyperglycemia, unspecified: Secondary | ICD-10-CM

## 2019-01-18 LAB — BLOOD GAS, VENOUS
Acid-base deficit: 13.9 mmol/L — ABNORMAL HIGH (ref 0.0–2.0)
Bicarbonate: 15.3 mmol/L — ABNORMAL LOW (ref 20.0–28.0)
O2 Saturation: 95.3 %
Patient temperature: 98.6
pCO2, Ven: 50.8 mmHg (ref 44.0–60.0)
pH, Ven: 7.106 — CL (ref 7.250–7.430)
pO2, Ven: 98.6 mmHg — ABNORMAL HIGH (ref 32.0–45.0)

## 2019-01-18 LAB — CBG MONITORING, ED
Glucose-Capillary: 354 mg/dL — ABNORMAL HIGH (ref 70–99)
Glucose-Capillary: 274 mg/dL — ABNORMAL HIGH (ref 70–99)

## 2019-01-18 LAB — CBC WITH DIFFERENTIAL/PLATELET
Abs Immature Granulocytes: 0.03 10*3/uL (ref 0.00–0.07)
Basophils Absolute: 0 10*3/uL (ref 0.0–0.1)
Basophils Relative: 0 %
Eosinophils Absolute: 0.1 10*3/uL (ref 0.0–0.5)
Eosinophils Relative: 2 %
HCT: 39.9 % (ref 39.0–52.0)
Hemoglobin: 12.8 g/dL — ABNORMAL LOW (ref 13.0–17.0)
Immature Granulocytes: 1 %
Lymphocytes Relative: 37 %
Lymphs Abs: 2.4 10*3/uL (ref 0.7–4.0)
MCH: 27.1 pg (ref 26.0–34.0)
MCHC: 32.1 g/dL (ref 30.0–36.0)
MCV: 84.5 fL (ref 80.0–100.0)
Monocytes Absolute: 0.6 10*3/uL (ref 0.1–1.0)
Monocytes Relative: 10 %
Neutro Abs: 3.4 10*3/uL (ref 1.7–7.7)
Neutrophils Relative %: 50 %
Platelets: 223 10*3/uL (ref 150–400)
RBC: 4.72 MIL/uL (ref 4.22–5.81)
RDW: 13.8 % (ref 11.5–15.5)
WBC: 6.6 10*3/uL (ref 4.0–10.5)
nRBC: 0 % (ref 0.0–0.2)

## 2019-01-18 LAB — URINALYSIS, ROUTINE W REFLEX MICROSCOPIC
Bacteria, UA: NONE SEEN
Bilirubin Urine: NEGATIVE
Glucose, UA: >=500 mg/dL — AB
Hgb urine dipstick: NEGATIVE
Ketones, ur: NEGATIVE mg/dL
Leukocytes,Ua: NEGATIVE
Nitrite: NEGATIVE
Protein, ur: NEGATIVE mg/dL
Specific Gravity, Urine: 1.021 (ref 1.005–1.030)
pH: 5 (ref 5.0–8.0)

## 2019-01-18 LAB — BLOOD GAS, ARTERIAL
Acid-base deficit: 2.9 mmol/L — ABNORMAL HIGH (ref 0.0–2.0)
Bicarbonate: 21.3 mmol/L (ref 20.0–28.0)
Drawn by: 23532
FIO2: 21
O2 Saturation: 95.5 %
Patient temperature: 98.6
pCO2 arterial: 37.3 mmHg (ref 32.0–48.0)
pH, Arterial: 7.375 (ref 7.350–7.450)
pO2, Arterial: 80.3 mmHg — ABNORMAL LOW (ref 83.0–108.0)

## 2019-01-18 LAB — BASIC METABOLIC PANEL
Anion gap: 9 (ref 5–15)
BUN: 17 mg/dL (ref 6–20)
CO2: 21 mmol/L — ABNORMAL LOW (ref 22–32)
Calcium: 8.9 mg/dL (ref 8.9–10.3)
Chloride: 104 mmol/L (ref 98–111)
Creatinine, Ser: 1.42 mg/dL — ABNORMAL HIGH (ref 0.61–1.24)
GFR calc Af Amer: >60 mL/min (ref >60)
GFR calc non Af Amer: >60 mL/min (ref >60)
Glucose, Bld: 360 mg/dL — ABNORMAL HIGH (ref 70–99)
Potassium: 4.3 mmol/L (ref 3.5–5.1)
Sodium: 134 mmol/L — ABNORMAL LOW (ref 135–145)

## 2019-01-18 LAB — LACTIC ACID, PLASMA: Lactic Acid, Venous: 2.5 mmol/L (ref 0.5–1.9)

## 2019-01-18 LAB — SARS CORONAVIRUS 2 BY RT PCR (HOSPITAL ORDER, PERFORMED IN ~~LOC~~ HOSPITAL LAB): SARS Coronavirus 2: NEGATIVE

## 2019-01-18 MED ORDER — SODIUM CHLORIDE 0.9 % IV BOLUS
1000.0000 mL | Freq: Once | INTRAVENOUS | Status: AC
  Administered 2019-01-18: 1000 mL via INTRAVENOUS

## 2019-01-18 MED ORDER — SODIUM CHLORIDE 0.9 % IV BOLUS
1000.0000 mL | Freq: Once | INTRAVENOUS | Status: AC
  Administered 2019-01-18: 20:00:00 1000 mL via INTRAVENOUS

## 2019-01-18 NOTE — ED Notes (Signed)
Lactic Acid draw, completed and sent to main lab.

## 2019-01-18 NOTE — ED Provider Notes (Signed)
[facility]-EMERGENCY DEPT Provider Note   CSN: [id number] Arrival date & time: 01/18/19  1824    History   Chief Complaint Chief Complaint  Patient presents with  . Hyperglycemia    HPI William Moss is a 33 y.o. male.     The history is provided by the patient.  Hyperglycemia  Blood sugar level PTA:  >300 Severity:  Moderate Onset quality:  Gradual Timing:  Constant Progression:  Unchanged Chronicity:  New Diabetes status:  Controlled with oral medications Context: recent illness (Had some GI issues over the weekend, resolved.)   Context: not noncompliance   Relieved by:  Nothing Ineffective treatments:  None tried Associated symptoms: increased thirst, nausea and polyuria   Associated symptoms: no abdominal pain, no blurred vision, no chest pain, no confusion, no dehydration, no dysuria, no fever, no shortness of breath and no vomiting   Risk factors: obesity   Risk factors: no hx of DKA     Past Medical History:  Diagnosis Date  . Atypical chest pain 05/27/2016   a. 05/2016: NST showing small area of moderare anteroapical ischemia b. 05/2016: cath showing tortous but normal cors which confirmed a false positive NST.  . Diabetes mellitus without complication (HCC)   . GERD (gastroesophageal reflux disease) 10/02/2016  . Hypertension     Patient Active Problem List   Diagnosis Date Noted  . GERD (gastroesophageal reflux disease) 10/02/2016  . Coronary artery disease involving native heart   . Abnormal nuclear stress test   . Atypical chest pain 05/27/2016  . Diabetes mellitus (HCC) 09/30/2015  . Hematochezia 09/30/2015  . Hypertension 07/29/2015  . Morbid obesity (HCC) 07/29/2015    Past Surgical History:  Procedure Laterality Date  . CARDIAC CATHETERIZATION N/A 06/10/2016   Procedure: Left Heart Cath and Coronary Angiography;  Surgeon: Iran Ouch, MD;  Location: [facility] INVASIVE CV LAB;  Service: Cardiovascular;  Laterality: N/A;  .  COLONOSCOPY N/A 02/04/2016   Procedure: COLONOSCOPY;  Surgeon: Hilarie Fredrickson, MD;  Location: [facility] ENDOSCOPY;  Service: Endoscopy;  Laterality: N/A;  . NO PAST SURGERIES          Home Medications    Prior to Admission medications   Medication Sig Start Date End Date Taking? Authorizing Provider  amLODipine (NORVASC) 10 MG tablet Take 1 tablet (10 mg total) by mouth daily. Patient taking differently: Take 10 mg by mouth every evening.  09/13/18 01/18/19 Yes Hoy Register, MD  atorvastatin (LIPITOR) 20 MG tablet Take 1 tablet (20 mg total) by mouth daily. 09/13/18  Yes Hoy Register, MD  carvedilol (COREG) 25 MG tablet Take 1 tablet (25 mg total) by mouth 2 (two) times daily. 09/13/18  Yes Newlin, Enobong, MD  glipiZIDE (GLUCOTROL) 10 MG tablet TAKE 1 TABLET BY MOUTH TWICE A DAY BEFORE A MEAL Patient taking differently: Take 10 mg by mouth 2 (two) times daily before a meal.  09/13/18  Yes Newlin, Enobong, MD  metFORMIN (GLUCOPHAGE) 500 MG tablet Take 2 tablets (1,000 mg total) by mouth 2 (two) times daily with a meal. 09/13/18  Yes Newlin, Enobong, MD  pantoprazole (PROTONIX) 40 MG tablet Take 1 tablet (40 mg total) by mouth daily. 09/13/18  Yes Hoy Register, MD  Blood Glucose Monitoring Suppl (TRUE METRIX METER) DEVI 1 each by Does not apply route 3 (three) times daily before meals. 07/29/15   Hoy Register, MD  cyclobenzaprine (FLEXERIL) 10 MG tablet Take 1 tablet (10 mg total) by mouth 2 (two) times daily as  needed for muscle spasms. Patient not taking: Reported on 01/18/2019 09/13/18   Hoy Register, MD  glucose blood (TRUE METRIX BLOOD GLUCOSE TEST) test strip 3 times daily before meals 07/29/15   Hoy Register, MD  lisinopril (PRINIVIL,ZESTRIL) 40 MG tablet Take 1 tablet (40 mg total) by mouth daily. Patient not taking: Reported on 01/18/2019 12/20/17   Hoy Register, MD  TRUEPLUS LANCETS 28G MISC 1 each by Does not apply route 3 (three) times daily before meals. 07/29/15   Hoy Register, MD    Family History Family History  Problem Relation Age of Onset  . Diabetes Mother   . Diabetes Father   . Cancer Maternal Grandmother   . Breast cancer Cousin   . Alzheimer's disease Paternal Grandmother     Social History Social History   Tobacco Use  . Smoking status: Never Smoker  . Smokeless tobacco: Never Used  Substance Use Topics  . Alcohol use: No  . Drug use: No     Allergies   Patient has no known allergies.   Review of Systems Review of Systems  Constitutional: Negative for chills and fever.  HENT: Negative for ear pain and sore throat.   Eyes: Negative for blurred vision, pain and visual disturbance.  Respiratory: Negative for cough and shortness of breath.   Cardiovascular: Negative for chest pain and palpitations.  Gastrointestinal: Positive for nausea. Negative for abdominal pain and vomiting.  Endocrine: Positive for polydipsia and polyuria.  Genitourinary: Negative for dysuria and hematuria.  Musculoskeletal: Negative for arthralgias and back pain.  Skin: Negative for color change and rash.  Neurological: Negative for seizures and syncope.  Psychiatric/Behavioral: Negative for confusion.  All other systems reviewed and are negative.    Physical Exam Updated Vital Signs  ED Triage Vitals  Enc Vitals Group     BP 01/18/19 1837 (!) 180/122     Pulse Rate 01/18/19 1837 85     Resp 01/18/19 1837 20     Temp 01/18/19 1837 98.8 F (37.1 C)     Temp Source 01/18/19 1837 Oral     SpO2 01/18/19 1837 100 %     Weight --      Height --      Head Circumference --      Peak Flow --      Pain Score 01/18/19 1836 0     Pain Loc --      Pain Edu? --      Excl. in GC? --     Physical Exam Vitals signs and nursing note reviewed.  Constitutional:      General: He is not in acute distress.    Appearance: He is well-developed. He is obese. He is not ill-appearing.  HENT:     Head: Normocephalic and atraumatic.     Nose: Nose normal.      Mouth/Throat:     Mouth: Mucous membranes are moist.  Eyes:     Extraocular Movements: Extraocular movements intact.     Conjunctiva/sclera: Conjunctivae normal.     Pupils: Pupils are equal, round, and reactive to light.  Neck:     Musculoskeletal: Normal range of motion and neck supple.  Cardiovascular:     Rate and Rhythm: Normal rate and regular rhythm.     Pulses: Normal pulses.     Heart sounds: Normal heart sounds. No murmur.  Pulmonary:     Effort: Pulmonary effort is normal. No respiratory distress.     Breath sounds: Normal breath sounds.  Abdominal:     General: There is no distension.     Palpations: Abdomen is soft.     Tenderness: There is no abdominal tenderness.  Musculoskeletal: Normal range of motion.  Skin:    General: Skin is warm and dry.     Capillary Refill: Capillary refill takes less than 2 seconds.  Neurological:     General: No focal deficit present.     Mental Status: He is alert and oriented to person, place, and time.  Psychiatric:        Mood and Affect: Mood normal.      ED Treatments / Results  Labs (all labs ordered are listed, but only abnormal results are displayed) Labs Reviewed  CBC WITH DIFFERENTIAL/PLATELET - Abnormal; Notable for the following components:      Result Value   Hemoglobin 12.8 (*)    All other components within normal limits  BASIC METABOLIC PANEL - Abnormal; Notable for the following components:   Sodium 134 (*)    CO2 21 (*)    Glucose, Bld 360 (*)    Creatinine, Ser 1.42 (*)    All other components within normal limits  URINALYSIS, ROUTINE W REFLEX MICROSCOPIC - Abnormal; Notable for the following components:   Color, Urine STRAW (*)    Glucose, UA >=500 (*)    All other components within normal limits  BLOOD GAS, VENOUS - Abnormal; Notable for the following components:   pH, Ven 7.106 (*)    pO2, Ven 98.6 (*)    Bicarbonate 15.3 (*)    Acid-base deficit 13.9 (*)    All other components within normal  limits  LACTIC ACID, PLASMA - Abnormal; Notable for the following components:   Lactic Acid, Venous 2.5 (*)    All other components within normal limits  CBG MONITORING, ED - Abnormal; Notable for the following components:   Glucose-Capillary 354 (*)    All other components within normal limits  CBG MONITORING, ED - Abnormal; Notable for the following components:   Glucose-Capillary 274 (*)    All other components within normal limits  SARS CORONAVIRUS 2 (HOSPITAL ORDER[facility])  BLOOD GAS, ARTERIAL    EKG EKG Interpretation  Date/Time:  Wednesday Jan 18 2019 19:47:30 EDT Ventricular Rate:  81 PR Interval:    QRS Duration: 105 QT Interval:  348 QTC Calculation: 404 R Axis:   64 Text Interpretation:  Sinus rhythm ST elev, probable normal early repol pattern Confirmed by Virgina NorfolkAdam, [place] Jakubiak [phone]([id number]) on 01/18/2019 8:10:14 PM   Radiology Dg Chest Portable 1 View  Result Date: 01/18/2019 CLINICAL DATA:  Acidosis EXAM: PORTABLE CHEST 1 VIEW COMPARISON:  07/26/2018 FINDINGS: The heart size and mediastinal contours are within normal limits. Both lungs are clear. The visualized skeletal structures are unremarkable. IMPRESSION: No active disease. Electronically Signed   By: Jasmine PangKim  Fujinaga M.D.   On: 01/18/2019 20:31    Procedures Procedures (including critical care time)  Medications Ordered in ED Medications  sodium chloride 0.9 % bolus 1,000 mL (0 mLs Intravenous Stopped 01/18/19 1945)  sodium chloride 0.9 % bolus 1,000 mL (0 mLs Intravenous Stopped 01/18/19 2134)     Initial Impression / Assessment and Plan / ED Course  I have reviewed the triage vital signs and the nursing notes.  Pertinent labs & imaging results that were available during my care of the patient were reviewed by me and considered in my medical decision making (see chart for details).  Asaph Gleckler is a 33 year old male with history of hypertension, diabetes who presents to the ED  with hyperglycemia.  Patient with mild hypertension but otherwise normal vitals.  Patient with increased blood sugar over the last several days.  Patient states greater than 300.  Patient is not on insulin.  Patient is morbidly obese.  States that he is compliant with his medications.  He states that he has had some increased thirst, increased urination.  Denies any shortness of breath, infectious symptoms.  Concern for new DKA/dehydration versus electrolyte abnormalities.  Patient with pH of 7.106.  CO2 is 50.  Concern for DKA.  Patient denies any alcohol use, no infectious symptoms.  Patient with blood sugar 350, however anion gap normal, bicarb unremarkable.  Patient does not appear to be in DKA.  Discussed with hospitalist and will give patient 2 L of fluids and recheck blood gas and CBG.  Overall, patient appears to have undiagnosed sleep apnea.  He states he is actually scheduled for sleep test in the next few weeks.  Suspect that part of this acidosis is chronic from a respiratory standpoint.  Hospitalist recommends getting lactic acid as well.  Patient has no respiratory symptoms currently. Likely will be okay for discharge and strongly encourage sleep study. EKG wnl.  Patient does not have any respiratory symptoms.  Denies any alcohol or drug abuse.  Overall looks comfortable.  Urinalysis unremarkable.  No ketones.  Chest x-ray showed no signs of pneumonia, pneumothorax, pleural effusion.  Coronavirus testing is negative.  Lactic acid within normal limits.  ABG showed improved pH.  Overall pH 7.35.  Blood gas is now normal.  Lactic acid was mildly elevated at 2.5 likely from dehydration.  Patient feels much improved after 2 L of normal saline.  Blood sugar has improved to 274.  At this time patient is clear for discharge to home.  Recommend close PCP follow-up as he would likely benefit from insulin therapy.  Discharged in good condition and given return precautions.  This chart was dictated using  voice recognition software.  Despite best efforts to proofread,  errors can occur which can change the documentation meaning.    Final Clinical Impressions(s) / ED Diagnoses   Final diagnoses:  Hyperglycemia    ED Discharge Orders    None       Virgina Norfolk, DO 01/18/19 2149

## 2019-01-18 NOTE — ED Triage Notes (Signed)
Patient c/o hyperglycemia with CBG >300 x3 days. Reports taking meds as prescribed. Denies N/V/D.

## 2019-01-18 NOTE — ED Notes (Signed)
Notified RT of ABG order.

## 2019-01-18 NOTE — ED Notes (Signed)
Date and time results received: 01/18/19 2143  Test: Lactic Acid  Critical Value: 2.5   Name of Provider Notified: Dr. Loletha Carrow   Orders Received? Or Actions Taken?: Will Continue to monitor and await for new orders.

## 2019-01-31 ENCOUNTER — Ambulatory Visit (HOSPITAL_BASED_OUTPATIENT_CLINIC_OR_DEPARTMENT_OTHER): Payer: 59 | Admitting: Pharmacist

## 2019-01-31 ENCOUNTER — Encounter: Payer: Self-pay | Admitting: Pharmacist

## 2019-01-31 ENCOUNTER — Other Ambulatory Visit: Payer: Self-pay

## 2019-01-31 ENCOUNTER — Ambulatory Visit: Payer: 59 | Attending: Family Medicine | Admitting: Family Medicine

## 2019-01-31 ENCOUNTER — Encounter: Payer: Self-pay | Admitting: Family Medicine

## 2019-01-31 DIAGNOSIS — I10 Essential (primary) hypertension: Secondary | ICD-10-CM | POA: Diagnosis not present

## 2019-01-31 DIAGNOSIS — E1165 Type 2 diabetes mellitus with hyperglycemia: Secondary | ICD-10-CM

## 2019-01-31 DIAGNOSIS — Z79899 Other long term (current) drug therapy: Secondary | ICD-10-CM

## 2019-01-31 LAB — POCT GLYCOSYLATED HEMOGLOBIN (HGB A1C): HbA1c, POC (controlled diabetic range): 10.2 % — AB (ref 0.0–7.0)

## 2019-01-31 MED ORDER — LIRAGLUTIDE 18 MG/3ML ~~LOC~~ SOPN: PEN_INJECTOR | SUBCUTANEOUS | 3 refills | Status: DC

## 2019-01-31 MED ORDER — ATORVASTATIN CALCIUM 20 MG PO TABS: 20.0000 mg | ORAL_TABLET | Freq: Every day | ORAL | 1 refills | Status: DC

## 2019-01-31 MED ORDER — GLIPIZIDE 10 MG PO TABS: 10.0000 mg | ORAL_TABLET | Freq: Two times a day (BID) | ORAL | 1 refills | Status: DC

## 2019-01-31 MED ORDER — LISINOPRIL 40 MG PO TABS: 40.0000 mg | ORAL_TABLET | Freq: Every day | ORAL | 1 refills | Status: DC

## 2019-01-31 MED ORDER — AMLODIPINE BESYLATE 10 MG PO TABS: 10.0000 mg | ORAL_TABLET | Freq: Every day | ORAL | 1 refills | Status: DC

## 2019-01-31 MED ORDER — METFORMIN HCL 500 MG PO TABS: 1000.0000 mg | ORAL_TABLET | Freq: Two times a day (BID) | ORAL | 1 refills | Status: DC

## 2019-01-31 MED ORDER — CARVEDILOL 25 MG PO TABS: 25.0000 mg | ORAL_TABLET | Freq: Two times a day (BID) | ORAL | 1 refills | Status: DC

## 2019-01-31 NOTE — Progress Notes (Signed)
Patient was educated on the use of the Victoza pen. Reviewed necessary supplies and operation of the pen. Also reviewed goal blood glucose levels. Patient was able to demonstrate use. All questions and concerns were addressed.  

## 2019-01-31 NOTE — Progress Notes (Signed)
Virtual Visit via Telephone Note  I connected with William Moss, on 01/31/2019 at 3:56 PM by telephone due to the COVID-19 pandemic and verified that I am speaking with the correct person using two identifiers.   Consent: I discussed the limitations, risks, security and privacy concerns of performing an evaluation and management service by telephone and the availability of in person appointments. I also discussed with the patient that there may be a patient responsible charge related to this service. The patient expressed understanding and agreed to proceed.   Location of Patient: Home  Location of Provider: Clinic   Persons participating in Telemedicine visit: Suanne MarkerLance Moss Alicia Farrington-CMA Dr. Nelwyn SalisburyNewlin-PCP    History of Present Illness: William Moss s a 33 year old male with a history of hypertension, type 2 diabetes mellitus (A1c 10.2 today), morbid obesity who comes in for follow-up visit. His A1c is 10.2 which has trended up from 6.0, five months ago.  He endorses fluctuations of his sugar which run in the 200-390 range but in the last 4 days his fasting sugars have been between 90 and 110. He has had episodes of nausea and vomiting which have caused him to leave work on he will be needing a form completed for work for short-term disability and this form will be faxed to the office.  He will need a block of time to be away until his sugars stabilize. He had an ED visit on 01/18/2019 for hyperglycemia which was treated with IV fluids. His blood pressure at the ED was also elevated and on review of his medications he has not been taking lisinopril  Past Medical History:  Diagnosis Date  . Atypical chest pain 05/27/2016   a. 05/2016: NST showing small area of moderare anteroapical ischemia b. 05/2016: cath showing tortous but normal cors which confirmed a false positive NST.  . Diabetes mellitus without complication (HCC)   . GERD (gastroesophageal reflux disease) 10/02/2016  .  Hypertension    No Known Allergies  Current Outpatient Medications on File Prior to Visit  Medication Sig Dispense Refill  . atorvastatin (LIPITOR) 20 MG tablet Take 1 tablet (20 mg total) by mouth daily. 90 tablet 1  . Blood Glucose Monitoring Suppl (TRUE METRIX METER) DEVI 1 each by Does not apply route 3 (three) times daily before meals. 1 Device 0  . carvedilol (COREG) 25 MG tablet Take 1 tablet (25 mg total) by mouth 2 (two) times daily. 180 tablet 1  . glipiZIDE (GLUCOTROL) 10 MG tablet TAKE 1 TABLET BY MOUTH TWICE A DAY BEFORE A MEAL (Patient taking differently: Take 10 mg by mouth 2 (two) times daily before a meal. ) 180 tablet 1  . glucose blood (TRUE METRIX BLOOD GLUCOSE TEST) test strip 3 times daily before meals 100 each 12  . metFORMIN (GLUCOPHAGE) 500 MG tablet Take 2 tablets (1,000 mg total) by mouth 2 (two) times daily with a meal. 360 tablet 1  . TRUEPLUS LANCETS 28G MISC 1 each by Does not apply route 3 (three) times daily before meals. 100 each 12  . amLODipine (NORVASC) 10 MG tablet Take 1 tablet (10 mg total) by mouth daily. (Patient taking differently: Take 10 mg by mouth every evening. ) 90 tablet 1  . cyclobenzaprine (FLEXERIL) 10 MG tablet Take 1 tablet (10 mg total) by mouth 2 (two) times daily as needed for muscle spasms. (Patient not taking: Reported on 01/18/2019) 20 tablet 0  . lisinopril (PRINIVIL,ZESTRIL) 40 MG tablet Take 1 tablet (40 mg total)  by mouth daily. (Patient not taking: Reported on 01/18/2019) 90 tablet 1  . pantoprazole (PROTONIX) 40 MG tablet Take 1 tablet (40 mg total) by mouth daily. (Patient not taking: Reported on 01/31/2019) 90 tablet 1   No current facility-administered medications on file prior to visit.     Observations/Objective: Awake, alert, oriented x3 Not in acute distress  CMP Latest Ref Rng & Units 01/18/2019 09/13/2018 05/05/2018  Glucose 70 - 99 mg/dL 315(X) 458(P) 929(W)  BUN 6 - 20 mg/dL 17 14 14   Creatinine 0.61 - 1.24 mg/dL  4.46(K) 8.63 8.17  Sodium 135 - 145 mmol/L 134(L) 144 139  Potassium 3.5 - 5.1 mmol/L 4.3 4.8 4.0  Chloride 98 - 111 mmol/L 104 106 103  CO2 22 - 32 mmol/L 21(L) 23 27  Calcium 8.9 - 10.3 mg/dL 8.9 9.3 9.3  Total Protein 6.0 - 8.5 g/dL - 6.9 -  Total Bilirubin 0.0 - 1.2 mg/dL - 0.3 -  Alkaline Phos 39 - 117 IU/L - 59 -  AST 0 - 40 IU/L - 10 -  ALT 0 - 44 IU/L - 14 -    Lipid Panel     Component Value Date/Time   CHOL 137 09/13/2018 1122   TRIG 92 09/13/2018 1122   HDL 43 09/13/2018 1122   CHOLHDL 3.2 09/13/2018 1122   CHOLHDL 4.0 08/28/2016 1000   VLDL 45 (H) 08/28/2016 1000   LDLCALC 76 09/13/2018 1122    Lab Results  Component Value Date   HGBA1C 6.0 09/13/2018     Assessment and Plan: 1. Essential hypertension Refilled amlodipine and lisinopril Compliance emphasized Counseled on blood pressure goal of less than 130/80, low-sodium, DASH diet, medication compliance, 150 minutes of moderate intensity exercise per week. Discussed medication compliance, adverse effects. - amLODipine (NORVASC) 10 MG tablet; Take 1 tablet (10 mg total) by mouth daily.  Dispense: 90 tablet; Refill: 1 - carvedilol (COREG) 25 MG tablet; Take 1 tablet (25 mg total) by mouth 2 (two) times daily.  Dispense: 180 tablet; Refill: 1 - lisinopril (ZESTRIL) 40 MG tablet; Take 1 tablet (40 mg total) by mouth daily.  Dispense: 90 tablet; Refill: 1  2. Type 2 diabetes mellitus with hyperglycemia, without long-term current use of insulin (HCC) Controlled with A1c of 10.2 which has trended up from 6.0 previously He was called into the clinic to have A1c done [facility] Victoza Clinical pharmacist called in to provide education on Victoza and titrating instructions also to provide counseling on diabetic diet and lifestyle modifications -this was done in person - atorvastatin (LIPITOR) 20 MG tablet; Take 1 tablet (20 mg total) by mouth daily.  Dispense: 90 tablet; Refill: 1 - glipiZIDE (GLUCOTROL) 10 MG tablet;  Take 1 tablet (10 mg total) by mouth 2 (two) times daily before a meal.  Dispense: 180 tablet; Refill: 1 - metFORMIN (GLUCOPHAGE) 500 MG tablet; Take 2 tablets (1,000 mg total) by mouth 2 (two) times daily with a meal.  Dispense: 360 tablet; Refill: 1 - liraglutide (VICTOZA) 18 MG/3ML SOPN; Inject subcutaneously daily with breakfast 0.6 mg daily for 1 week then 1.2 mg daily for 1 week then 1.8 mg daily thereafter  Dispense: 30 mL; Refill: 3 - POCT glycosylated hemoglobin (Hb A1C)   Follow Up Instructions: 3 months   I discussed the assessment and treatment plan with the patient. The patient was provided an opportunity to ask questions and all were answered. The patient agreed with the plan and demonstrated an understanding of the instructions.  The patient was advised to call back or seek an in-person evaluation if the symptoms worsen or if the condition fails to improve as anticipated.     I provided 15 minutes total of non-face-to-face time during this encounter including median intraservice time, reviewing previous notes, labs, imaging, medications, management and patient verbalized understanding.     Hoy Register, MD, FAAFP. [facility] [place], [place] [phone]   01/31/2019, 3:56 PM

## 2019-01-31 NOTE — Patient Instructions (Signed)
Diabetes Mellitus and Nutrition, Adult  When you have diabetes (diabetes mellitus), it is very important to have healthy eating habits because your blood sugar (glucose) levels are greatly affected by what you eat and drink. Eating healthy foods in the appropriate amounts, at about the same times every day, can help you:  · Control your blood glucose.  · Lower your risk of heart disease.  · Improve your blood pressure.  · Reach or maintain a healthy weight.  Every person with diabetes is different, and each person has different needs for a meal plan. Your health care provider may recommend that you work with a diet and nutrition specialist (dietitian) to make a meal plan that is best for you. Your meal plan may vary depending on factors such as:  · The calories you need.  · The medicines you take.  · Your weight.  · Your blood glucose, blood pressure, and cholesterol levels.  · Your activity level.  · Other health conditions you have, such as heart or kidney disease.  How do carbohydrates affect me?  Carbohydrates, also called carbs, affect your blood glucose level more than any other type of food. Eating carbs naturally raises the amount of glucose in your blood. Carb counting is a method for keeping track of how many carbs you eat. Counting carbs is important to keep your blood glucose at a healthy level, especially if you use insulin or take certain oral diabetes medicines.  It is important to know how many carbs you can safely have in each meal. This is different for every person. Your dietitian can help you calculate how many carbs you should have at each meal and for each snack.  Foods that contain carbs include:  · Bread, cereal, rice, pasta, and crackers.  · Potatoes and corn.  · Peas, beans, and lentils.  · Milk and yogurt.  · Fruit and juice.  · Desserts, such as cakes, cookies, ice cream, and candy.  How does alcohol affect me?  Alcohol can cause a sudden decrease in blood glucose (hypoglycemia),  especially if you use insulin or take certain oral diabetes medicines. Hypoglycemia can be a life-threatening condition. Symptoms of hypoglycemia (sleepiness, dizziness, and confusion) are similar to symptoms of having too much alcohol.  If your health care provider says that alcohol is safe for you, follow these guidelines:  · Limit alcohol intake to no more than 1 drink per day for nonpregnant women and 2 drinks per day for men. One drink equals 12 oz of beer, 5 oz of wine, or 1½ oz of hard liquor.  · Do not drink on an empty stomach.  · Keep yourself hydrated with water, diet soda, or unsweetened iced tea.  · Keep in mind that regular soda, juice, and other mixers may contain a lot of sugar and must be counted as carbs.  What are tips for following this plan?    Reading food labels  · Start by checking the serving size on the "Nutrition Facts" label of packaged foods and drinks. The amount of calories, carbs, fats, and other nutrients listed on the label is based on one serving of the item. Many items contain more than one serving per package.  · Check the total grams (g) of carbs in one serving. You can calculate the number of servings of carbs in one serving by dividing the total carbs by 15. For example, if a food has 30 g of total carbs, it would be equal to 2   servings of carbs.  · Check the number of grams (g) of saturated and trans fats in one serving. Choose foods that have low or no amount of these fats.  · Check the number of milligrams (mg) of salt (sodium) in one serving. Most people should limit total sodium intake to less than 2,300 mg per day.  · Always check the nutrition information of foods labeled as "low-fat" or "nonfat". These foods may be higher in added sugar or refined carbs and should be avoided.  · Talk to your dietitian to identify your daily goals for nutrients listed on the label.  Shopping  · Avoid buying canned, premade, or processed foods. These foods tend to be high in fat, sodium,  and added sugar.  · Shop around the outside edge of the grocery store. This includes fresh fruits and vegetables, bulk grains, fresh meats, and fresh dairy.  Cooking  · Use low-heat cooking methods, such as baking, instead of high-heat cooking methods like deep frying.  · Cook using healthy oils, such as olive, canola, or sunflower oil.  · Avoid cooking with butter, cream, or high-fat meats.  Meal planning  · Eat meals and snacks regularly, preferably at the same times every day. Avoid going long periods of time without eating.  · Eat foods high in fiber, such as fresh fruits, vegetables, beans, and whole grains. Talk to your dietitian about how many servings of carbs you can eat at each meal.  · Eat 4-6 ounces (oz) of lean protein each day, such as lean meat, chicken, fish, eggs, or tofu. One oz of lean protein is equal to:  ? 1 oz of meat, chicken, or fish.  ? 1 egg.  ? ¼ cup of tofu.  · Eat some foods each day that contain healthy fats, such as avocado, nuts, seeds, and fish.  Lifestyle  · Check your blood glucose regularly.  · Exercise regularly as told by your health care provider. This may include:  ? 150 minutes of moderate-intensity or vigorous-intensity exercise each week. This could be brisk walking, biking, or water aerobics.  ? Stretching and doing strength exercises, such as yoga or weightlifting, at least 2 times a week.  · Take medicines as told by your health care provider.  · Do not use any products that contain nicotine or tobacco, such as cigarettes and e-cigarettes. If you need help quitting, ask your health care provider.  · Work with a counselor or diabetes educator to identify strategies to manage stress and any emotional and social challenges.  Questions to ask a health care provider  · Do I need to meet with a diabetes educator?  · Do I need to meet with a dietitian?  · What number can I call if I have questions?  · When are the best times to check my blood glucose?  Where to find more  information:  · American Diabetes Association: diabetes.org  · Academy of Nutrition and Dietetics: www.eatright.org  · National Institute of Diabetes and Digestive and Kidney Diseases (NIH): www.niddk.nih.gov  Summary  · A healthy meal plan will help you control your blood glucose and maintain a healthy lifestyle.  · Working with a diet and nutrition specialist (dietitian) can help you make a meal plan that is best for you.  · Keep in mind that carbohydrates (carbs) and alcohol have immediate effects on your blood glucose levels. It is important to count carbs and to use alcohol carefully.  This information is not intended to   replace advice given to you by your health care provider. Make sure you discuss any questions you have with your health care provider.  Document Released: 05/14/2005 Document Revised: 03/17/2017 Document Reviewed: 09/21/2016  Elsevier Interactive Patient Education © 2019 Elsevier Inc.

## 2019-01-31 NOTE — Progress Notes (Signed)
Patient has been called and DOB has been verified. Patient has been screened and transferred to PCP to start phone visit.     

## 2019-02-08 ENCOUNTER — Telehealth: Payer: Self-pay

## 2019-02-08 NOTE — Telephone Encounter (Signed)
I was informed by Raynelle Fanning at the front desk that the patient called in reporting a high copay on his victoza. I called and spoke with the pharmacy who ran a claim and it is a $40 copay for a three month supply. I left a vm for the patient with that information and gave my direct extension for him to call back with any further questions or concerns.

## 2019-02-14 ENCOUNTER — Other Ambulatory Visit (HOSPITAL_COMMUNITY)
Admission: RE | Admit: 2019-02-14 | Discharge: 2019-02-14 | Disposition: A | Discharge status: Home / Self Care | Payer: 59 | Source: Ambulatory Visit | Attending: Internal Medicine | Admitting: Internal Medicine

## 2019-02-14 DIAGNOSIS — Z1159 Encounter for screening for other viral diseases: Secondary | ICD-10-CM | POA: Insufficient documentation

## 2019-02-15 ENCOUNTER — Telehealth: Payer: Self-pay | Admitting: *Deleted

## 2019-02-15 LAB — NOVEL CORONAVIRUS, NAA (HOSP ORDER, SEND-OUT TO REF LAB; TAT 18-24 HRS): SARS-CoV-2, NAA: NOT DETECTED

## 2019-02-15 NOTE — Progress Notes (Signed)
LMOMTCB x 1 

## 2019-02-15 NOTE — Telephone Encounter (Signed)
Patient aware of negative COVID results as stated by [facility].  Nothing further needed; will sign off.

## 2019-02-17 ENCOUNTER — Other Ambulatory Visit: Payer: Self-pay

## 2019-02-17 ENCOUNTER — Ambulatory Visit (HOSPITAL_BASED_OUTPATIENT_CLINIC_OR_DEPARTMENT_OTHER): Payer: 59 | Attending: Family Medicine | Admitting: Internal Medicine

## 2019-02-17 DIAGNOSIS — G4733 Obstructive sleep apnea (adult) (pediatric): Secondary | ICD-10-CM | POA: Diagnosis present

## 2019-02-17 DIAGNOSIS — R29818 Other symptoms and signs involving the nervous system: Secondary | ICD-10-CM | POA: Diagnosis not present

## 2019-02-20 ENCOUNTER — Other Ambulatory Visit: Payer: Self-pay

## 2019-02-21 ENCOUNTER — Other Ambulatory Visit: Payer: Self-pay | Admitting: Pharmacist

## 2019-02-21 ENCOUNTER — Other Ambulatory Visit: Payer: Self-pay

## 2019-02-21 ENCOUNTER — Ambulatory Visit: Payer: 59 | Attending: Family Medicine | Admitting: Pharmacist

## 2019-02-21 ENCOUNTER — Encounter: Payer: Self-pay | Admitting: Pharmacist

## 2019-02-21 DIAGNOSIS — E1165 Type 2 diabetes mellitus with hyperglycemia: Secondary | ICD-10-CM | POA: Diagnosis not present

## 2019-02-21 DIAGNOSIS — Z79899 Other long term (current) drug therapy: Secondary | ICD-10-CM

## 2019-02-21 LAB — GLUCOSE, POCT (MANUAL RESULT ENTRY): POC Glucose: 146 mg/dl — AB (ref 70–99)

## 2019-02-21 MED ORDER — TRUEPLUS PEN NEEDLES 32G X 4 MM MISC: 11 refills | Status: DC

## 2019-02-21 NOTE — Progress Notes (Signed)
Patient was educated on the use of the Victoza pen. Reviewed necessary supplies and operation of the pen. Provided patient with pen needles. Also reviewed goal blood glucose levels. Patient was able to demonstrate use. All questions and concerns were addressed.   Of note, he was unable to start Victoza because of insurance issues. Additionally, he did not get pen needles. Those issues resolved - patient to begin taking tomorrow morning.

## 2019-02-22 ENCOUNTER — Encounter: Payer: Self-pay | Admitting: Family Medicine

## 2019-02-22 NOTE — Telephone Encounter (Signed)
Please follow up with patient regarding the sleep study results.

## 2019-02-23 ENCOUNTER — Other Ambulatory Visit: Payer: Self-pay | Admitting: Family Medicine

## 2019-02-23 DIAGNOSIS — E1165 Type 2 diabetes mellitus with hyperglycemia: Secondary | ICD-10-CM

## 2019-02-25 DIAGNOSIS — G4733 Obstructive sleep apnea (adult) (pediatric): Secondary | ICD-10-CM | POA: Diagnosis not present

## 2019-02-25 NOTE — Procedures (Signed)
Patient Name: William Moss, William Moss Date: 02/17/2019 Gender: Male D.O.B: 02/25/1986 Age (years): 32 Referring Provider: Arnoldo Morale Height (inches): 68 Interpreting Physician: Baird Lyons MD, ABSM Weight (lbs): 378 RPSGT: Zadie Rhine BMI: 17 MRN: [id number] Neck Size: 20.00  CLINICAL INFORMATION Sleep Study Type: Split Night CPAP Indication for sleep study: Diabetes, Hypertension, Obesity, OSA Epworth Sleepiness Score: 9  SLEEP STUDY TECHNIQUE As per the AASM Manual for the Scoring of Sleep and Associated Events v2.3 (April 2016) with a hypopnea requiring 4% desaturations.  The channels recorded and monitored were frontal, central and occipital EEG, electrooculogram (EOG), submentalis EMG (chin), nasal and oral airflow, thoracic and abdominal wall motion, anterior tibialis EMG, snore microphone, electrocardiogram, and pulse oximetry. Continuous positive airway pressure (CPAP) was initiated when the patient met split night criteria and was titrated according to treat sleep-disordered breathing.  MEDICATIONS Medications self-administered by patient taken the night of the study : none reported  RESPIRATORY PARAMETERS Diagnostic  Total AHI (/hr): 60.1 RDI (/hr): 64.4 OA Index (/hr): 5.4 CA Index (/hr): 0.0 REM AHI (/hr): 114.0 NREM AHI (/hr): 55.3 Supine AHI (/hr): 60.1 Non-supine AHI (/hr): 0 Min O2 Sat (%): 77.0 Mean O2 (%): 91.2 Time below 88% (min): 12.4   Titration  Optimal Pressure (cm): 19 AHI at Optimal Pressure (/hr): 0.0 Min O2 at Optimal Pressure (%): 90.0 Supine % at Optimal (%): 100 Sleep % at Optimal (%): 100   SLEEP ARCHITECTURE The recording time for the entire night was 436.3 minutes.  During a baseline period of 135.3 minutes, the patient slept for 122.9 minutes in REM and nonREM, yielding a sleep efficiency of 90.8%%. Sleep onset after lights out was 9.9 minutes with a REM latency of 91.5 minutes. The patient spent 2.4%% of the night in stage N1 sleep,  86.2%% in stage N2 sleep, 3.3%% in stage N3 and 8.1% in REM.  During the titration period of 295.2 minutes, the patient slept for 152.4 minutes in REM and nonREM, yielding a sleep efficiency of 51.6%%. Sleep onset after CPAP initiation was 48.3 minutes with a REM latency of 91.0 minutes. The patient spent 8.2%% of the night in stage N1 sleep, 64.9%% in stage N2 sleep, 0.0%% in stage N3 and 26.9% in REM.  CARDIAC DATA The 2 lead EKG demonstrated sinus rhythm. The mean heart rate was 100.0 beats per minute. Other EKG findings include: None.  LEG MOVEMENT DATA The total Periodic Limb Movements of Sleep (PLMS) were 0. The PLMS index was 0.0 .  IMPRESSIONS - Severe obstructive sleep apnea occurred during the diagnostic portion of the study (AHI = 60.1/hour). An optimal PAP pressure was selected for this patient ( 19 cm of water) - No significant central sleep apnea occurred during the diagnostic portion of the study (CAI = 0.0/hour). - Severe oxygen desaturation was noted during the diagnostic portion of the study (Min O2 = 77.0%). - No snoring was audible during the diagnostic portion of the study. - No cardiac abnormalities were noted during this study. - Clinically significant periodic limb movements did not occur during sleep.  DIAGNOSIS - Obstructive Sleep Apnea (327.23 [G47.33 ICD-10])  RECOMMENDATIONS - Trial of CPAP therapy on 19 cm H2O or autopap 10-20. - Patient used a Medium size Fisher[employer]Paykel Full Face Mask Simplus mask and heated humidification. - Be careful with alcohol, sedatives and other CNS depressants that may worsen sleep apnea and disrupt normal sleep architecture. - Sleep hygiene should be reviewed to assess factors that may improve sleep quality. -  Weight management and regular exercise should be initiated or continued.  [Electronically signed] 02/25/2019 11:14 AM  Baird Lyons MD, ABSM Diplomate, American Board of Sleep Medicine   NPI: [id number]                          [place]  ELECTRONICALLY SIGNED ON:  02/25/2019, 11:12 AM [place] PH: [phone]   FX: [phone] [place]

## 2019-02-27 ENCOUNTER — Telehealth: Payer: Self-pay

## 2019-02-27 ENCOUNTER — Other Ambulatory Visit: Payer: Self-pay | Admitting: Family Medicine

## 2019-02-27 DIAGNOSIS — G4733 Obstructive sleep apnea (adult) (pediatric): Secondary | ICD-10-CM

## 2019-02-27 NOTE — Telephone Encounter (Signed)
Attempted to contact the patient  # [phone] to inform him of his sleep study results and the process for obtaining a CPAP machine.  Message left requesting a call back to this CM # [phone]

## 2019-02-27 NOTE — Telephone Encounter (Signed)
Call received from patient and explained to him the results of the sleep study and that Dr Margarita Rana is ordering a CPAP for him. He had not preference for DME companies.   Order for CPAP faxed to [facility]

## 2019-03-02 ENCOUNTER — Telehealth: Payer: Self-pay

## 2019-03-02 NOTE — Telephone Encounter (Addendum)
Call placed to [place], spoke to [facility] who confirmed that they have all information needed for CPAP referral and the Respiratory  therapist is in the process of contacing the patient for set up of the machine.

## 2019-03-05 ENCOUNTER — Other Ambulatory Visit: Payer: Self-pay | Admitting: Family Medicine

## 2019-03-05 DIAGNOSIS — K219 Gastro-esophageal reflux disease without esophagitis: Secondary | ICD-10-CM

## 2019-03-06 ENCOUNTER — Telehealth: Payer: Self-pay | Admitting: Family Medicine

## 2019-03-06 ENCOUNTER — Encounter: Payer: Self-pay | Admitting: Family Medicine

## 2019-03-06 NOTE — Telephone Encounter (Addendum)
Pt dropped of forms for [employer]). Pt request complete and fax to [phone].  Placed forms in providers bin.

## 2019-03-07 NOTE — Telephone Encounter (Signed)
Patient presented paperwork to front office yesterday afternoon

## 2019-03-14 ENCOUNTER — Telehealth: Payer: Self-pay

## 2019-03-14 NOTE — Telephone Encounter (Signed)
Call placed to [place], spoke to [facility] who stated that the CPAP order is being processed for delivery.

## 2019-03-15 ENCOUNTER — Telehealth: Payer: Self-pay | Admitting: Cardiovascular Disease

## 2019-03-15 NOTE — Telephone Encounter (Signed)
Lm for recall °

## 2019-03-24 ENCOUNTER — Telehealth: Payer: Self-pay | Admitting: Cardiology

## 2019-03-24 NOTE — Telephone Encounter (Signed)
I called pt to confirm appt, no answer and no voicemailo

## 2019-03-27 ENCOUNTER — Other Ambulatory Visit: Payer: Self-pay

## 2019-03-27 ENCOUNTER — Encounter: Payer: Self-pay | Admitting: Cardiology

## 2019-03-27 ENCOUNTER — Ambulatory Visit (INDEPENDENT_AMBULATORY_CARE_PROVIDER_SITE_OTHER): Payer: 59 | Admitting: Cardiology

## 2019-03-27 DIAGNOSIS — R0789 Other chest pain: Secondary | ICD-10-CM

## 2019-03-27 DIAGNOSIS — I10 Essential (primary) hypertension: Secondary | ICD-10-CM | POA: Diagnosis not present

## 2019-03-27 DIAGNOSIS — G4730 Sleep apnea, unspecified: Secondary | ICD-10-CM | POA: Insufficient documentation

## 2019-03-27 DIAGNOSIS — E119 Type 2 diabetes mellitus without complications: Secondary | ICD-10-CM

## 2019-03-27 DIAGNOSIS — Z0389 Encounter for observation for other suspected diseases and conditions ruled out: Secondary | ICD-10-CM

## 2019-03-27 DIAGNOSIS — G4733 Obstructive sleep apnea (adult) (pediatric): Secondary | ICD-10-CM

## 2019-03-27 DIAGNOSIS — IMO0001 Reserved for inherently not codable concepts without codable children: Secondary | ICD-10-CM | POA: Insufficient documentation

## 2019-03-27 NOTE — Patient Instructions (Signed)
Medication Instructions:  Your physician recommends that you continue on your current medications as directed. Please refer to the Current Medication list given to you today. If you need a refill on your cardiac medications before your next appointment, please call your pharmacy.   Lab work: none If you have labs (blood work) drawn today and your tests are completely normal, you will receive your results only by: Marland Kitchen MyChart Message (if you have MyChart) OR . A paper copy in the mail If you have any lab test that is abnormal or we need to change your treatment, we will call you to review the results.  Testing/Procedures: None   Follow-Up: At [facility], you and your health needs are our priority.  As part of our continuing mission to provide you with exceptional heart care, we have created designated Provider Care Teams.  These Care Teams include your primary Cardiologist (physician) and Advanced Practice Providers (APPs -  Physician Assistants and Nurse Practitioners) who all work together to provide you with the care you need, when you need it. Lurena Joiner recommends you follow up as needed  Any Other Special Instructions Will Be Listed Below (If Applicable).

## 2019-03-27 NOTE — Assessment & Plan Note (Signed)
2017

## 2019-03-27 NOTE — Progress Notes (Signed)
Cardiology Office Note:    Date:  03/27/2019   ID:  William NieceLance Gavia, DOB 1985-09-12, MRN [id number]  PCP:  Hoy RegisterNewlin, Enobong, MD  Cardiologist:  Dr Duke Salviaandolph Electrophysiologist:  None   Referring MD: Hoy RegisterNewlin, Enobong, MD   No chief complaint on file.  History of Present Illness:    William Moss is a 33 y.o. male, works  at a call center, with a hx of non-insulin-dependent diabetes, morbid obesity, sleep apnea on CPAP, hypertension, and a past history of chest pain.  In 2017 the patient had chest pain and underwent nuclear stress test.  This was abnormal and he progressed to coronary angiogram Oct 2017 which revealed normal coronaries.  His LOV was Oct 2018.   He is in the office today for routine follow up.  Since we saw him last he has been seen in the ED with uncontrolled blood sugars.  He has also been diagnosed with sleep apnea and is now on C-pap.  He denies any chest pain, unusual dyspnea, or palpitations.  Past Medical History:  Diagnosis Date  . Atypical chest pain 05/27/2016   a. 05/2016: NST showing small area of moderare anteroapical ischemia b. 05/2016: cath showing tortous but normal cors which confirmed a false positive NST.  . Diabetes mellitus without complication (HCC)   . GERD (gastroesophageal reflux disease) 10/02/2016  . Hypertension     Past Surgical History:  Procedure Laterality Date  . CARDIAC CATHETERIZATION N/A 06/10/2016   Procedure: Left Heart Cath and Coronary Angiography;  Surgeon: Iran OuchMuhammad A Arida, MD;  Location: [facility] INVASIVE CV LAB;  Service: Cardiovascular;  Laterality: N/A;  . COLONOSCOPY N/A 02/04/2016   Procedure: COLONOSCOPY;  Surgeon: Hilarie FredricksonJohn N Perry, MD;  Location: [facility] ENDOSCOPY;  Service: Endoscopy;  Laterality: N/A;  . NO PAST SURGERIES      Current Medications: Current Meds  Medication Sig  . amLODipine (NORVASC) 10 MG tablet Take 1 tablet (10 mg total) by mouth daily.  . Blood Glucose Monitoring Suppl (TRUE METRIX METER) DEVI 1 each by Does not  apply route 3 (three) times daily before meals.  . carvedilol (COREG) 25 MG tablet Take 1 tablet (25 mg total) by mouth 2 (two) times daily.  Marland Kitchen. glipiZIDE (GLUCOTROL) 10 MG tablet Take 1 tablet (10 mg total) by mouth 2 (two) times daily before a meal.  . glucose blood (TRUE METRIX BLOOD GLUCOSE TEST) test strip 3 times daily before meals  . Insulin Pen Needle (TRUEPLUS PEN NEEDLES) 32G X 4 MM MISC Use as instructed with Victoza.  Marland Kitchen. liraglutide (VICTOZA) 18 MG/3ML SOPN Inject subcutaneously daily with breakfast 0.6 mg daily for 1 week then 1.2 mg daily for 1 week then 1.8 mg daily thereafter  . lisinopril (ZESTRIL) 40 MG tablet Take 1 tablet (40 mg total) by mouth daily.  . metFORMIN (GLUCOPHAGE) 500 MG tablet Take 2 tablets (1,000 mg total) by mouth 2 (two) times daily with a meal.  . TRUEPLUS LANCETS 28G MISC 1 each by Does not apply route 3 (three) times daily before meals.  . [DISCONTINUED] atorvastatin (LIPITOR) 20 MG tablet Take 1 tablet (20 mg total) by mouth daily.     Allergies:   Patient has no known allergies.   Social History   Socioeconomic History  . Marital status: Single    Spouse name: Not on file  . Number of children: 0  . Years of education: Not on file  . Highest education level: Not on file  Occupational History  . Occupation: call  center rep  Social Needs  . Financial resource strain: Not on file  . Food insecurity    Worry: Not on file    Inability: Not on file  . Transportation needs    Medical: Not on file    Non-medical: Not on file  Tobacco Use  . Smoking status: Never Smoker  . Smokeless tobacco: Never Used  Substance and Sexual Activity  . Alcohol use: No  . Drug use: No  . Sexual activity: Not on file  Lifestyle  . Physical activity    Days per week: Not on file    Minutes per session: Not on file  . Stress: Not on file  Relationships  . Social Musicianconnections    Talks on phone: Not on file    Gets together: Not on file    Attends religious  service: Not on file    Active member of club or organization: Not on file    Attends meetings of clubs or organizations: Not on file    Relationship status: Not on file  Other Topics Concern  . Not on file  Social History Narrative  . Not on file     Family History: The patient's family history includes Alzheimer's disease in his paternal grandmother; Breast cancer in his cousin; Cancer in his maternal grandmother; Diabetes in his father and mother.  ROS:   Please see the history of present illness.     All other systems reviewed and are negative.  EKGs/Labs/Other Studies Reviewed:    The following studies were reviewed today: Cath 2017  EKG:  EKG isnot ordered today.  The ekg ordered May 2020 demonstrates NSR without acute changes  Recent Labs: 09/13/2018: ALT 14 01/18/2019: BUN 17; Creatinine, Ser 1.42; Hemoglobin 12.8; Platelets 223; Potassium 4.3; Sodium 134  Recent Lipid Panel    Component Value Date/Time   CHOL 137 09/13/2018 1122   TRIG 92 09/13/2018 1122   HDL 43 09/13/2018 1122   CHOLHDL 3.2 09/13/2018 1122   CHOLHDL 4.0 08/28/2016 1000   VLDL 45 (H) 08/28/2016 1000   LDLCALC 76 09/13/2018 1122    Physical Exam:    VS:  BP 136/71   Pulse 86   Temp 97.9 F (36.6 C)   Ht 5\' 8"  (1.727 m)   Wt (!) 379 lb (171.9 kg)   SpO2 95%   BMI 57.63 kg/m     Wt Readings from Last 3 Encounters:  03/27/19 (!) 379 lb (171.9 kg)  02/17/19 (!) 378 lb (171.5 kg)  09/13/18 (!) 389 lb 12.8 oz (176.8 kg)     GEN: Morbidly obese AA male in no acute distress HEENT: Normal NECK: No JVD; No carotid bruits LYMPHATICS: No lymphadenopathy CARDIAC: RRR, no murmurs, rubs, gallops RESPIRATORY:  Clear to auscultation without rales, wheezing or rhonchi  ABDOMEN: Obese Soft, non-tender, non-distended MUSCULOSKELETAL:  No edema; No deformity  SKIN: Warm and dry NEUROLOGIC:  Alert and oriented x 3 PSYCHIATRIC:  Normal affect   ASSESSMENT:    Morbid obesity (HCC) BMI  57  Non-insulin dependent type 2 diabetes mellitus (HCC) Victoza recently added  Essential hypertension Controlled  Sleep apnea On C-pap  Atypical chest pain 2017 Abnormal Myoview- cath normal coronaries  Normal coronary arteries 2017  PLAN:    Pt is followed regularly by Dr Odette HornsEnobong.  Lipitor 20 mg was listed as a medication he had been prescribed but he says he was never on this.  I suggested he discuss this with Dr Odette HornsEnobong, from a cardiac  standpoint he obtain some benefit from statin rx, even though he had normal coronaries in 2017.  We can see him on a PRN basis.    Medication Adjustments/Labs and Tests Ordered: Current medicines are reviewed at length with the patient today.  Concerns regarding medicines are outlined above.  No orders of the defined types were placed in this encounter.  No orders of the defined types were placed in this encounter.   Patient Instructions  Medication Instructions:  Your physician recommends that you continue on your current medications as directed. Please refer to the Current Medication list given to you today. If you need a refill on your cardiac medications before your next appointment, please call your pharmacy.   Lab work: none If you have labs (blood work) drawn today and your tests are completely normal, you will receive your results only by: Marland Kitchen MyChart Message (if you have MyChart) OR . A paper copy in the mail If you have any lab test that is abnormal or we need to change your treatment, we will call you to review the results.  Testing/Procedures: None   Follow-Up: At [facility], you and your health needs are our priority.  As part of our continuing mission to provide you with exceptional heart care, we have created designated Provider Care Teams.  These Care Teams include your primary Cardiologist (physician) and Advanced Practice Providers (APPs -  Physician Assistants and Nurse Practitioners) who all work together to  provide you with the care you need, when you need it. Lurena Joiner recommends you follow up as needed  Any Other Special Instructions Will Be Listed Below (If Applicable).      Angelena Form, PA-C  03/27/2019 8:52 AM    [facility]

## 2019-03-27 NOTE — Assessment & Plan Note (Signed)
Victoza recently added

## 2019-03-27 NOTE — Assessment & Plan Note (Signed)
Controlled.  

## 2019-03-27 NOTE — Assessment & Plan Note (Signed)
On C-pap 

## 2019-03-27 NOTE — Assessment & Plan Note (Signed)
2017 Abnormal Myoview- cath normal coronaries

## 2019-03-27 NOTE — Assessment & Plan Note (Signed)
BMI 57 

## 2019-04-20 ENCOUNTER — Other Ambulatory Visit: Payer: Self-pay

## 2019-04-20 ENCOUNTER — Encounter: Payer: Self-pay | Admitting: Registered"

## 2019-04-20 ENCOUNTER — Encounter: Payer: 59 | Attending: Family Medicine | Admitting: Registered"

## 2019-04-20 DIAGNOSIS — E119 Type 2 diabetes mellitus without complications: Secondary | ICD-10-CM | POA: Diagnosis present

## 2019-04-20 NOTE — Patient Instructions (Signed)
Aim to eat balanced meals and snacks Increase vegetable intake: Goal is to have broccoli or some other vegetable 3x week. Choose as side at restaurant, buy steamables, get frozen dinners that include broccoli. Adding cooked carrots to your fettuccini to expand your variety of vegetables Consider doing on-line grocery pick-up again. Rethink your drink - consider switching out your sweetened beverages  Sleep: aim to get 7-8 hrs each night. Try to find a regular time to go to bed and continue using CPAP  Continue exercising 3x week, consider increasing with a goal of eventually working up to 45 minute each time  Review in the book hypoglycemia & foot care

## 2019-04-20 NOTE — Progress Notes (Signed)
Diabetes Self-Management Education  Visit Type: First/Initial  Appt. Start Time: 1435 Appt. End Time: 4268  04/20/2019  Mr. William Moss, identified by name and date of birth, is a 33 y.o. male with a diagnosis of Diabetes: Type 2.   ASSESSMENT  There were no vitals taken for this visit. There is no height or weight on file to calculate BMI.   A1c 6.0% 09/13/2018; 10.2% 01/31/2019 Pt states he is not aware of major changes during that time. Pt states he was going to gym 3-4 x/week; now walks around neighborhood ~3x week.  Pt states since starting Victoza reports hypoglycemia last time was 69 mg/dL a few weeks ago. His next PCP appt is in 3 weeks and plans to review medications then.  Pt states barriers to having vegetables more often is dislike or in experience with variety of vegetables. Pt states he likes broccoli and cauliflower, but has to eat it cooked.   Pt states prior to getting CPAP he was getting about 6 hrs of restless sleep. Now getting 5 1/2 - 6 hrs and has notice increased energy. RD discussed value of 7-8 hrs to get 4 complete cycles of sleep.  Pt states he has moved away from processed cheese, uses ground [place] for burritos.   The main source of concentrated carbs appears to come from some of the drinks.  Diabetes Self-Management Education - 04/20/19 1431      Visit Information   Visit Type  First/Initial      Initial Visit   Diabetes Type  Type 2    Are you currently following a meal plan?  No    Are you taking your medications as prescribed?  Yes   Victoza, glipizide, metformin   Date Diagnosed  07/2015      Health Coping   How would you rate your overall health?  Fair      Psychosocial Assessment   Patient Belief/Attitude about Diabetes  Motivated to manage diabetes    How often do you need to have someone help you when you read instructions, pamphlets, or other written materials from your doctor or pharmacy?  1 - Never    What is the last grade level you  completed in school?  some college      Complications   Last HgB A1C per patient/outside source  10.2 %    How often do you check your blood sugar?  1-2 times/day    Fasting Blood glucose range (mg/dL)  70-129   low 100s   Number of hypoglycemic episodes per month  3    Can you tell when your blood sugar is low?  Yes    What do you do if your blood sugar is low?  snack OR drink juice    Number of hyperglycemic episodes per week  0    Have you had a dilated eye exam in the past 12 months?  No    Have you had a dental exam in the past 12 months?  No    Are you checking your feet?  Yes    How many days per week are you checking your feet?  3      Dietary Intake   Breakfast  mcmuffin OR poptart    Snack (morning)  granola bar OR candy bar    Lunch  skips 3-4x week OR at work [employer] or fast food OR at home sandwich    Snack (afternoon)  none    Dinner  might eat  half meal [place] food OR broccoli alfredo OR fast food    Snack (evening)  none or small desert cookie    Beverage(s)  water, soda, fruit punch, gatorade zero      Exercise   Exercise Type  Light (walking / raking leaves)    How many days per week to you exercise?  3    How many minutes per day do you exercise?  25    Total minutes per week of exercise  75      Patient Education   Previous Diabetes Education  No    Nutrition management   Role of diet in the treatment of diabetes and the relationship between the three main macronutrients and blood glucose level;Food label reading, portion sizes and measuring food.;Reviewed blood glucose goals for pre and post meals and how to evaluate the patients' food intake on their blood glucose level.    Acute complications  Taught treatment of hypoglycemia - the 15 rule.      Individualized Goals (developed by patient)   Nutrition  General guidelines for healthy choices and portions discussed    Physical Activity  Exercise 3-5 times per week    Reducing Risk  treat hypoglycemia with 15  grams of carbs if blood glucose less than 70mg /dL;increase portions of nuts and seeds      Outcomes   Expected Outcomes  Demonstrated interest in learning. Expect positive outcomes    Future DMSE  4-6 wks    Program Status  Completed       Individualized Plan for Diabetes Self-Management Training:   Learning Objective:  Patient will have a greater understanding of diabetes self-management. Patient education plan is to attend individual and/or group sessions per assessed needs and concerns.   Patient Instructions  Aim to eat balanced meals and snacks Increase vegetable intake: Goal is to have broccoli or some other vegetable 3x week. Choose as side at restaurant, buy steamables, get frozen dinners that include broccoli. Adding cooked carrots to your fettuccini to expand your variety of vegetables Consider doing on-line grocery pick-up again. Rethink your drink - consider switching out your sweetened beverages  Sleep: aim to get 7-8 hrs each night. Try to find a regular time to go to bed and continue using CPAP  Continue exercising 3x week, consider increasing with a goal of eventually working up to 45 minute each time  Review in the book hypoglycemia & foot care   Expected Outcomes:  Demonstrated interest in learning. Expect positive outcomes  Education material provided: ADA - How to Thrive: A Guide for Your Journey with Diabetes, A1C conversion sheet and Snack sheet  If problems or questions, patient to contact team via:  Phone and MyChart  Future DSME appointment: 4-6 wks

## 2019-04-28 ENCOUNTER — Telehealth: Payer: Self-pay | Admitting: Family Medicine

## 2019-04-28 NOTE — Telephone Encounter (Signed)
New Message   Pt came in and dropped off paperwork for Dr. Margarita Rana. Paperwork left in [facility] box

## 2019-05-01 NOTE — Telephone Encounter (Signed)
Paperwork will be filled out and patient will be called once paperwork is ready for pick up.

## 2019-05-15 ENCOUNTER — Encounter: Payer: Self-pay | Admitting: Family Medicine

## 2019-05-15 ENCOUNTER — Ambulatory Visit: Payer: 59 | Attending: Family Medicine | Admitting: Family Medicine

## 2019-05-15 ENCOUNTER — Other Ambulatory Visit: Payer: Self-pay

## 2019-05-15 VITALS — BP 139/92 | HR 80 | Temp 98.3°F | Ht 68.0 in | Wt 380.0 lb

## 2019-05-15 DIAGNOSIS — Z23 Encounter for immunization: Secondary | ICD-10-CM

## 2019-05-15 DIAGNOSIS — E119 Type 2 diabetes mellitus without complications: Secondary | ICD-10-CM

## 2019-05-15 DIAGNOSIS — E1169 Type 2 diabetes mellitus with other specified complication: Secondary | ICD-10-CM | POA: Diagnosis not present

## 2019-05-15 DIAGNOSIS — G4733 Obstructive sleep apnea (adult) (pediatric): Secondary | ICD-10-CM

## 2019-05-15 DIAGNOSIS — I10 Essential (primary) hypertension: Secondary | ICD-10-CM

## 2019-05-15 LAB — GLUCOSE, POCT (MANUAL RESULT ENTRY): POC Glucose: 85 mg/dl (ref 70–99)

## 2019-05-15 LAB — POCT GLYCOSYLATED HEMOGLOBIN (HGB A1C): HbA1c, POC (controlled diabetic range): 6 % (ref 0.0–7.0)

## 2019-05-15 MED ORDER — VICTOZA 18 MG/3ML ~~LOC~~ SOPN: PEN_INJECTOR | SUBCUTANEOUS | 6 refills | Status: DC

## 2019-05-15 MED ORDER — LISINOPRIL 10 MG PO TABS: 10.0000 mg | ORAL_TABLET | Freq: Every day | ORAL | 1 refills | Status: DC

## 2019-05-15 MED ORDER — CARVEDILOL 25 MG PO TABS: 25.0000 mg | ORAL_TABLET | Freq: Two times a day (BID) | ORAL | 1 refills | Status: DC

## 2019-05-15 MED ORDER — METFORMIN HCL 500 MG PO TABS: 1000.0000 mg | ORAL_TABLET | Freq: Two times a day (BID) | ORAL | 1 refills | Status: DC

## 2019-05-15 MED ORDER — AMLODIPINE BESYLATE 10 MG PO TABS: 10.0000 mg | ORAL_TABLET | Freq: Every day | ORAL | 1 refills | Status: DC

## 2019-05-15 MED ORDER — GLIPIZIDE 5 MG PO TABS: 5.0000 mg | ORAL_TABLET | Freq: Two times a day (BID) | ORAL | 1 refills | Status: DC

## 2019-05-15 NOTE — Patient Instructions (Signed)
Diabetes Mellitus and Exercise Exercising regularly is important for your overall health, especially when you have diabetes (diabetes mellitus). Exercising is not only about losing weight. It has many other health benefits, such as increasing muscle strength and bone density and reducing body fat and stress. This leads to improved fitness, flexibility, and endurance, all of which result in better overall health. Exercise has additional benefits for people with diabetes, including:  Reducing appetite.  Helping to lower and control blood glucose.  Lowering blood pressure.  Helping to control amounts of fatty substances (lipids) in the blood, such as cholesterol and triglycerides.  Helping the body to respond better to insulin (improving insulin sensitivity).  Reducing how much insulin the body needs.  Decreasing the risk for heart disease by: ? Lowering cholesterol and triglyceride levels. ? Increasing the levels of good cholesterol. ? Lowering blood glucose levels. What is my activity plan? Your health care provider or certified diabetes educator can help you make a plan for the type and frequency of exercise (activity plan) that works for you. Make sure that you:  Do at least 150 minutes of moderate-intensity or vigorous-intensity exercise each week. This could be brisk walking, biking, or water aerobics. ? Do stretching and strength exercises, such as yoga or weightlifting, at least 2 times a week. ? Spread out your activity over at least 3 days of the week.  Get some form of physical activity every day. ? Do not go more than 2 days in a row without some kind of physical activity. ? Avoid being inactive for more than 30 minutes at a time. Take frequent breaks to walk or stretch.  Choose a type of exercise or activity that you enjoy, and set realistic goals.  Start slowly, and gradually increase the intensity of your exercise over time. What do I need to know about managing my  diabetes?   Check your blood glucose before and after exercising. ? If your blood glucose is 240 mg/dL (13.3 mmol/L) or higher before you exercise, check your urine for ketones. If you have ketones in your urine, do not exercise until your blood glucose returns to normal. ? If your blood glucose is 100 mg/dL (5.6 mmol/L) or lower, eat a snack containing 15-20 grams of carbohydrate. Check your blood glucose 15 minutes after the snack to make sure that your level is above 100 mg/dL (5.6 mmol/L) before you start your exercise.  Know the symptoms of low blood glucose (hypoglycemia) and how to treat it. Your risk for hypoglycemia increases during and after exercise. Common symptoms of hypoglycemia can include: ? Hunger. ? Anxiety. ? Sweating and feeling clammy. ? Confusion. ? Dizziness or feeling light-headed. ? Increased heart rate or palpitations. ? Blurry vision. ? Tingling or numbness around the mouth, lips, or tongue. ? Tremors or shakes. ? Irritability.  Keep a rapid-acting carbohydrate snack available before, during, and after exercise to help prevent or treat hypoglycemia.  Avoid injecting insulin into areas of the body that are going to be exercised. For example, avoid injecting insulin into: ? The arms, when playing tennis. ? The legs, when jogging.  Keep records of your exercise habits. Doing this can help you and your health care provider adjust your diabetes management plan as needed. Write down: ? Food that you eat before and after you exercise. ? Blood glucose levels before and after you exercise. ? The type and amount of exercise you have done. ? When your insulin is expected to peak, if you use   insulin. Avoid exercising at times when your insulin is peaking.  When you start a new exercise or activity, work with your health care provider to make sure the activity is safe for you, and to adjust your insulin, medicines, or food intake as needed.  Drink plenty of water while  you exercise to prevent dehydration or heat stroke. Drink enough fluid to keep your urine clear or pale yellow. Summary  Exercising regularly is important for your overall health, especially when you have diabetes (diabetes mellitus).  Exercising has many health benefits, such as increasing muscle strength and bone density and reducing body fat and stress.  Your health care provider or certified diabetes educator can help you make a plan for the type and frequency of exercise (activity plan) that works for you.  When you start a new exercise or activity, work with your health care provider to make sure the activity is safe for you, and to adjust your insulin, medicines, or food intake as needed. This information is not intended to replace advice given to you by your health care provider. Make sure you discuss any questions you have with your health care provider. Document Released: 11/07/2003 Document Revised: 03/11/2017 Document Reviewed: 01/27/2016 Elsevier Patient Education  2020 Elsevier Inc.  

## 2019-05-15 NOTE — Progress Notes (Signed)
Subjective:  Patient ID: William Moss, male    DOB: 23-Sep-1985  Age: 33 y.o. MRN: [id number]  CC: Diabetes   HPI William Moss is a 33 year old male with a history of hypertension, morbid obesity, type 2 diabetes mellitus (A1c 6.0) obstructive sleep apnea who presents today for follow-up visit. He is A1c is 6.0 and has improved significantly from 10.2, three months ago.  He has had random sugars that have gotten as low as 70 but he denies hypoglycemic symptoms.  He states the Victoza causes him to lose his appetite.  Denies neuropathy in extremities and denies visual concerns.  Not up-to-date on annual eye exam. He did get to see a nutritionist and he is working on his weight.  Does not get much exercise but plans to commence exercising now that the gym is open. He now has a CPAP machine with improved quality of sleep. His blood pressure is slightly elevated and he endorses compliance with carvedilol and amlodipine but has not been taking lisinopril 40 mg.  Denies additional concerns at this time and is willing to receive the Tdap and flu shots.  Past Medical History:  Diagnosis Date  . Atypical chest pain 05/27/2016   a. 05/2016: NST showing small area of moderare anteroapical ischemia b. 05/2016: cath showing tortous but normal cors which confirmed a false positive NST.  . Diabetes mellitus without complication ([place])   . GERD (gastroesophageal reflux disease) 10/02/2016  . Hypertension     Past Surgical History:  Procedure Laterality Date  . CARDIAC CATHETERIZATION N/A 06/10/2016   Procedure: Left Heart Cath and Coronary Angiography;  Surgeon: Wellington Hampshire, MD;  Location: [facility];  Service: Cardiovascular;  Laterality: N/A;  . COLONOSCOPY N/A 02/04/2016   Procedure: COLONOSCOPY;  Surgeon: Irene Shipper, MD;  Location: [facility];  Service: Endoscopy;  Laterality: N/A;  . NO PAST SURGERIES      Family History  Problem Relation Age of Onset  . Diabetes Mother   .  Diabetes Father   . Cancer Maternal Grandmother   . Breast cancer Cousin   . Alzheimer's disease Paternal Grandmother     No Known Allergies  Outpatient Medications Prior to Visit  Medication Sig Dispense Refill  . Blood Glucose Monitoring Suppl (TRUE METRIX METER) DEVI 1 each by Does not apply route 3 (three) times daily before meals. 1 Device 0  . glucose blood (TRUE METRIX BLOOD GLUCOSE TEST) test strip 3 times daily before meals 100 each 12  . Insulin Pen Needle (TRUEPLUS PEN NEEDLES) 32G X 4 MM MISC Use as instructed with Victoza. 100 each 11  . TRUEPLUS LANCETS 28G MISC 1 each by Does not apply route 3 (three) times daily before meals. 100 each 12  . carvedilol (COREG) 25 MG tablet Take 1 tablet (25 mg total) by mouth 2 (two) times daily. 180 tablet 1  . glipiZIDE (GLUCOTROL) 10 MG tablet Take 1 tablet (10 mg total) by mouth 2 (two) times daily before a meal. 180 tablet 1  . liraglutide (VICTOZA) 18 MG/3ML SOPN Inject subcutaneously daily with breakfast 0.6 mg daily for 1 week then 1.2 mg daily for 1 week then 1.8 mg daily thereafter 30 mL 3  . lisinopril (ZESTRIL) 40 MG tablet Take 1 tablet (40 mg total) by mouth daily. 90 tablet 1  . metFORMIN (GLUCOPHAGE) 500 MG tablet Take 2 tablets (1,000 mg total) by mouth 2 (two) times daily with a meal. 360 tablet 1  . amLODipine (NORVASC)  10 MG tablet Take 1 tablet (10 mg total) by mouth daily. 90 tablet 1   No facility-administered medications prior to visit.      ROS Review of Systems  Constitutional: Negative for activity change and appetite change.  HENT: Negative for sinus pressure and sore throat.   Eyes: Negative for visual disturbance.  Respiratory: Negative for cough, chest tightness and shortness of breath.   Cardiovascular: Negative for chest pain and leg swelling.  Gastrointestinal: Negative for abdominal distention, abdominal pain, constipation and diarrhea.  Endocrine: Negative.   Genitourinary: Negative for dysuria.   Musculoskeletal: Negative for joint swelling and myalgias.  Skin: Negative for rash.  Allergic/Immunologic: Negative.   Neurological: Negative for weakness, light-headedness and numbness.  Psychiatric/Behavioral: Negative for dysphoric mood and suicidal ideas.    Objective:  BP (!) 139/92   Pulse 80   Temp 98.3 F (36.8 C) (Oral)   Ht 5\' 8"  (1.727 m)   Wt (!) 380 lb (172.4 kg)   SpO2 97%   BMI 57.78 kg/m   BP/Weight 05/15/2019 04/20/2019 03/27/2019  Systolic BP 139 - 136  Diastolic BP 92 - 71  Wt. (Lbs) 380 - 379  BMI 57.78 - 57.63      Physical Exam Constitutional:      Appearance: He is well-developed. He is obese.  Cardiovascular:     Rate and Rhythm: Normal rate.     Heart sounds: Normal heart sounds. No murmur.  Pulmonary:     Effort: Pulmonary effort is normal.     Breath sounds: Normal breath sounds. No wheezing or rales.  Chest:     Chest wall: No tenderness.  Abdominal:     General: Bowel sounds are normal. There is no distension.     Palpations: Abdomen is soft. There is no mass.     Tenderness: There is no abdominal tenderness.  Musculoskeletal: Normal range of motion.  Neurological:     Mental Status: He is alert and oriented to person, place, and time.     CMP Latest Ref Rng & Units 01/18/2019 09/13/2018 05/05/2018  Glucose 70 - 99 mg/dL 161(W360(H) 960(A120(H) 540(J392(H)  BUN 6 - 20 mg/dL 17 14 14   Creatinine 0.61 - 1.24 mg/dL 8.11(B1.42(H) 1.471.11 8.291.13  Sodium 135 - 145 mmol/L 134(L) 144 139  Potassium 3.5 - 5.1 mmol/L 4.3 4.8 4.0  Chloride 98 - 111 mmol/L 104 106 103  CO2 22 - 32 mmol/L 21(L) 23 27  Calcium 8.9 - 10.3 mg/dL 8.9 9.3 9.3  Total Protein 6.0 - 8.5 g/dL - 6.9 -  Total Bilirubin 0.0 - 1.2 mg/dL - 0.3 -  Alkaline Phos 39 - 117 IU/L - 59 -  AST 0 - 40 IU/L - 10 -  ALT 0 - 44 IU/L - 14 -    Lipid Panel     Component Value Date/Time   CHOL 137 09/13/2018 1122   TRIG 92 09/13/2018 1122   HDL 43 09/13/2018 1122   CHOLHDL 3.2 09/13/2018 1122   CHOLHDL 4.0  08/28/2016 1000   VLDL 45 (H) 08/28/2016 1000   LDLCALC 76 09/13/2018 1122    CBC    Component Value Date/Time   WBC 6.6 01/18/2019 1840   RBC 4.72 01/18/2019 1840   HGB 12.8 (L) 01/18/2019 1840   HCT 39.9 01/18/2019 1840   PLT 223 01/18/2019 1840   MCV 84.5 01/18/2019 1840   MCH 27.1 01/18/2019 1840   MCHC 32.1 01/18/2019 1840   RDW 13.8 01/18/2019 1840   LYMPHSABS  2.4 01/18/2019 1840   MONOABS 0.6 01/18/2019 1840   EOSABS 0.1 01/18/2019 1840   BASOSABS 0.0 01/18/2019 1840    Lab Results  Component Value Date   HGBA1C 6.0 05/15/2019    Assessment & Plan:   1. Type 2 diabetes mellitus with other specified complication, without long-term current use of insulin (HCC) Controlled with A1c of 6.0 which has improved significantly from 10.2 Reduce glipizide from 10 mg to 5 mg twice daily to prevent hypoglycemia Counseled on Diabetic diet, my plate method, 161150 minutes of moderate intensity exercise/week Keep blood sugar logs with fasting goals of 80-120 mg/dl, random of less than 096180 and in the event of sugars less than 60 mg/dl or greater than 045400 mg/dl please notify the clinic ASAP. It is recommended that you undergo annual eye exams and annual foot exams. Pneumonia vaccine is recommended. - POCT glucose (manual entry) - POCT glycosylated hemoglobin (Hb A1C) - liraglutide (VICTOZA) 18 MG/3ML SOPN; Inject subcutaneously daily with breakfast 0.6 mg daily for 1 week then 1.2 mg daily for 1 week then 1.8 mg daily thereafter  Dispense: 30 mL; Refill: 6 - Ambulatory referral to Ophthalmology - glipiZIDE (GLUCOTROL) 5 MG tablet; Take 1 tablet (5 mg total) by mouth 2 (two) times daily before a meal.  Dispense: 180 tablet; Refill: 1 - metFORMIN (GLUCOPHAGE) 500 MG tablet; Take 2 tablets (1,000 mg total) by mouth 2 (two) times daily with a meal.  Dispense: 360 tablet; Refill: 1  2. Obstructive sleep apnea syndrome Controlled on CPAP Weight loss will be beneficial  3. Essential  hypertension Slightly elevated Compliant with carvedilol and amlodipine but has not been taking lisinopril 40 mg We will cut back on lisinopril down to 10 mg to prevent hypotension as blood pressure is 139/92 on Coreg and amlodipine Counseled on blood pressure goal of less than 130/80, low-sodium, DASH diet, medication compliance, 150 minutes of moderate intensity exercise per week. Discussed medication compliance, adverse effects. - lisinopril (ZESTRIL) 10 MG tablet; Take 1 tablet (10 mg total) by mouth daily.  Dispense: 90 tablet; Refill: 1 - carvedilol (COREG) 25 MG tablet; Take 1 tablet (25 mg total) by mouth 2 (two) times daily.  Dispense: 180 tablet; Refill: 1 - amLODipine (NORVASC) 10 MG tablet; Take 1 tablet (10 mg total) by mouth daily.  Dispense: 90 tablet; Refill: 1  4. Morbid obesity (HCC) Counseled on 150 minutes of exercise per week, healthy eating (including decreased daily intake of saturated fats, cholesterol, added sugars, sodium)   Healthcare maintenance-flu shot and Tdap today  Meds ordered this encounter  Medications  . liraglutide (VICTOZA) 18 MG/3ML SOPN    Sig: Inject subcutaneously daily with breakfast 0.6 mg daily for 1 week then 1.2 mg daily for 1 week then 1.8 mg daily thereafter    Dispense:  30 mL    Refill:  6  . glipiZIDE (GLUCOTROL) 5 MG tablet    Sig: Take 1 tablet (5 mg total) by mouth 2 (two) times daily before a meal.    Dispense:  180 tablet    Refill:  1    Dose decrease  . lisinopril (ZESTRIL) 10 MG tablet    Sig: Take 1 tablet (10 mg total) by mouth daily.    Dispense:  90 tablet    Refill:  1    Dose decrease  . carvedilol (COREG) 25 MG tablet    Sig: Take 1 tablet (25 mg total) by mouth 2 (two) times daily.    Dispense:  180 tablet    Refill:  1  . amLODipine (NORVASC) 10 MG tablet    Sig: Take 1 tablet (10 mg total) by mouth daily.    Dispense:  90 tablet    Refill:  1  . metFORMIN (GLUCOPHAGE) 500 MG tablet    Sig: Take 2 tablets  (1,000 mg total) by mouth 2 (two) times daily with a meal.    Dispense:  360 tablet    Refill:  1    Follow-up: Return in about 6 months (around 11/12/2019) for medical conditions.       Hoy Register, MD, FAAFP. [facility] [place], [place] [phone]   05/15/2019, 3:58 PM

## 2019-05-24 ENCOUNTER — Encounter: Payer: Self-pay | Admitting: Registered"

## 2019-05-25 ENCOUNTER — Ambulatory Visit: Payer: 59 | Admitting: Registered"

## 2019-06-12 ENCOUNTER — Encounter: Payer: Self-pay | Admitting: Registered"

## 2019-06-14 ENCOUNTER — Ambulatory Visit: Payer: 59 | Admitting: Registered"

## 2019-06-20 ENCOUNTER — Encounter: Payer: Self-pay | Admitting: Family Medicine

## 2019-06-21 NOTE — Telephone Encounter (Signed)
Is patient needing another COVID test

## 2019-06-26 ENCOUNTER — Encounter: Payer: Self-pay | Admitting: Family Medicine

## 2019-06-27 ENCOUNTER — Emergency Department (HOSPITAL_COMMUNITY): Payer: 59

## 2019-06-27 ENCOUNTER — Inpatient Hospital Stay (HOSPITAL_COMMUNITY)
Admission: EM | Admit: 2019-06-27 | Discharge: 2019-07-02 | DRG: 871 | Disposition: A | Discharge status: Home / Self Care | Payer: 59 | Attending: Internal Medicine | Admitting: Internal Medicine

## 2019-06-27 ENCOUNTER — Encounter (HOSPITAL_COMMUNITY): Payer: Self-pay | Admitting: Emergency Medicine

## 2019-06-27 ENCOUNTER — Other Ambulatory Visit: Payer: Self-pay

## 2019-06-27 DIAGNOSIS — E785 Hyperlipidemia, unspecified: Secondary | ICD-10-CM | POA: Diagnosis present

## 2019-06-27 DIAGNOSIS — E860 Dehydration: Secondary | ICD-10-CM | POA: Diagnosis present

## 2019-06-27 DIAGNOSIS — R000 Tachycardia, unspecified: Secondary | ICD-10-CM | POA: Diagnosis not present

## 2019-06-27 DIAGNOSIS — J1289 Other viral pneumonia: Secondary | ICD-10-CM | POA: Diagnosis present

## 2019-06-27 DIAGNOSIS — E118 Type 2 diabetes mellitus with unspecified complications: Secondary | ICD-10-CM | POA: Diagnosis not present

## 2019-06-27 DIAGNOSIS — E119 Type 2 diabetes mellitus without complications: Secondary | ICD-10-CM

## 2019-06-27 DIAGNOSIS — J9621 Acute and chronic respiratory failure with hypoxia: Secondary | ICD-10-CM | POA: Diagnosis present

## 2019-06-27 DIAGNOSIS — K219 Gastro-esophageal reflux disease without esophagitis: Secondary | ICD-10-CM | POA: Diagnosis present

## 2019-06-27 DIAGNOSIS — Z79899 Other long term (current) drug therapy: Secondary | ICD-10-CM | POA: Diagnosis not present

## 2019-06-27 DIAGNOSIS — U071 COVID-19: Secondary | ICD-10-CM | POA: Diagnosis present

## 2019-06-27 DIAGNOSIS — I2510 Atherosclerotic heart disease of native coronary artery without angina pectoris: Secondary | ICD-10-CM | POA: Diagnosis present

## 2019-06-27 DIAGNOSIS — E7800 Pure hypercholesterolemia, unspecified: Secondary | ICD-10-CM

## 2019-06-27 DIAGNOSIS — K2100 Gastro-esophageal reflux disease with esophagitis, without bleeding: Secondary | ICD-10-CM | POA: Diagnosis not present

## 2019-06-27 DIAGNOSIS — G4730 Sleep apnea, unspecified: Secondary | ICD-10-CM | POA: Diagnosis not present

## 2019-06-27 DIAGNOSIS — J9601 Acute respiratory failure with hypoxia: Secondary | ICD-10-CM | POA: Diagnosis not present

## 2019-06-27 DIAGNOSIS — A4189 Other specified sepsis: Secondary | ICD-10-CM | POA: Diagnosis not present

## 2019-06-27 DIAGNOSIS — I10 Essential (primary) hypertension: Secondary | ICD-10-CM | POA: Diagnosis present

## 2019-06-27 DIAGNOSIS — G4733 Obstructive sleep apnea (adult) (pediatric): Secondary | ICD-10-CM | POA: Diagnosis present

## 2019-06-27 DIAGNOSIS — J1282 Pneumonia due to coronavirus disease 2019: Secondary | ICD-10-CM

## 2019-06-27 DIAGNOSIS — Z6841 Body Mass Index (BMI) 40.0 and over, adult: Secondary | ICD-10-CM

## 2019-06-27 DIAGNOSIS — E088 Diabetes mellitus due to underlying condition with unspecified complications: Secondary | ICD-10-CM | POA: Diagnosis not present

## 2019-06-27 DIAGNOSIS — N182 Chronic kidney disease, stage 2 (mild): Secondary | ICD-10-CM

## 2019-06-27 DIAGNOSIS — E1165 Type 2 diabetes mellitus with hyperglycemia: Secondary | ICD-10-CM | POA: Diagnosis present

## 2019-06-27 DIAGNOSIS — E1122 Type 2 diabetes mellitus with diabetic chronic kidney disease: Secondary | ICD-10-CM

## 2019-06-27 DIAGNOSIS — IMO0002 Reserved for concepts with insufficient information to code with codable children: Secondary | ICD-10-CM | POA: Diagnosis present

## 2019-06-27 LAB — CBC WITH DIFFERENTIAL/PLATELET
Abs Immature Granulocytes: 0.07 10*3/uL (ref 0.00–0.07)
Basophils Absolute: 0 10*3/uL (ref 0.0–0.1)
Basophils Relative: 0 %
Eosinophils Absolute: 0.1 10*3/uL (ref 0.0–0.5)
Eosinophils Relative: 1 %
HCT: 42.8 % (ref 39.0–52.0)
Hemoglobin: 13.9 g/dL (ref 13.0–17.0)
Immature Granulocytes: 1 %
Lymphocytes Relative: 17 %
Lymphs Abs: 2 10*3/uL (ref 0.7–4.0)
MCH: 27.4 pg (ref 26.0–34.0)
MCHC: 32.5 g/dL (ref 30.0–36.0)
MCV: 84.4 fL (ref 80.0–100.0)
Monocytes Absolute: 1.7 10*3/uL — ABNORMAL HIGH (ref 0.1–1.0)
Monocytes Relative: 15 %
Neutro Abs: 7.5 10*3/uL (ref 1.7–7.7)
Neutrophils Relative %: 66 %
Platelets: 252 10*3/uL (ref 150–400)
RBC: 5.07 MIL/uL (ref 4.22–5.81)
RDW: 14.5 % (ref 11.5–15.5)
WBC: 11.4 10*3/uL — ABNORMAL HIGH (ref 4.0–10.5)
nRBC: 0 % (ref 0.0–0.2)

## 2019-06-27 LAB — COMPREHENSIVE METABOLIC PANEL
ALT: 29 U/L (ref 0–44)
AST: 29 U/L (ref 15–41)
Albumin: 3.9 g/dL (ref 3.5–5.0)
Alkaline Phosphatase: 65 U/L (ref 38–126)
Anion gap: 14 (ref 5–15)
BUN: 12 mg/dL (ref 6–20)
CO2: 18 mmol/L — ABNORMAL LOW (ref 22–32)
Calcium: 9 mg/dL (ref 8.9–10.3)
Chloride: 102 mmol/L (ref 98–111)
Creatinine, Ser: 1.13 mg/dL (ref 0.61–1.24)
GFR calc Af Amer: >60 mL/min (ref >60)
GFR calc non Af Amer: >60 mL/min (ref >60)
Glucose, Bld: 116 mg/dL — ABNORMAL HIGH (ref 70–99)
Potassium: 4.9 mmol/L (ref 3.5–5.1)
Sodium: 134 mmol/L — ABNORMAL LOW (ref 135–145)
Total Bilirubin: 1.5 mg/dL — ABNORMAL HIGH (ref 0.3–1.2)
Total Protein: 8.9 g/dL — ABNORMAL HIGH (ref 6.5–8.1)

## 2019-06-27 LAB — LACTIC ACID, PLASMA
Lactic Acid, Venous: 2 mmol/L (ref 0.5–1.9)
Lactic Acid, Venous: 1.1 mmol/L (ref 0.5–1.9)

## 2019-06-27 LAB — PROCALCITONIN: Procalcitonin: <0.1 ng/mL

## 2019-06-27 LAB — FIBRINOGEN: Fibrinogen: 652 mg/dL — ABNORMAL HIGH (ref 210–475)

## 2019-06-27 LAB — C-REACTIVE PROTEIN: CRP: 17.6 mg/dL — ABNORMAL HIGH (ref <1.0)

## 2019-06-27 LAB — LACTATE DEHYDROGENASE: LDH: 387 U/L — ABNORMAL HIGH (ref 98–192)

## 2019-06-27 LAB — TRIGLYCERIDES: Triglycerides: 109 mg/dL (ref <150)

## 2019-06-27 LAB — FERRITIN: Ferritin: 103 ng/mL (ref 24–336)

## 2019-06-27 LAB — D-DIMER, QUANTITATIVE: D-Dimer, Quant: 3.84 ug/mL-FEU — ABNORMAL HIGH (ref 0.00–0.50)

## 2019-06-27 MED ORDER — ACETAMINOPHEN 325 MG PO TABS
650.0000 mg | ORAL_TABLET | Freq: Once | ORAL | Status: AC
  Administered 2019-06-27: 650 mg via ORAL
  Filled 2019-06-27: qty 2

## 2019-06-27 MED ORDER — SODIUM CHLORIDE 0.9 % IV SOLN
1000.0000 mL | INTRAVENOUS | Status: DC
  Administered 2019-06-27: 1000 mL via INTRAVENOUS

## 2019-06-27 NOTE — H&P (Signed)
History and Physical   William Moss ZOX:[id number] DOB: May 09, 1986 DOA: 06/27/2019  Referring Moss/NP/PA: Dr. Linwood DibblesJon Moss  PCP: William Moss, William Moss   Outpatient Specialists: None  Patient coming from: Home  Chief Complaint: Shortness of breath  HPI: William Moss is a 33 y.o. male with medical history significant of recently diagnosed COVID-19, diabetes, hypertension, nonobstructive coronary artery disease, morbid obesity, hyperlipidemia who presents to the ER with progressive shortness of breath and right-sided chest pain in the last 2 days.  Patient was diagnosed apparently was COVID-19 in the outpatient setting last Saturday.  He was doing okay initially at home until the last 2 days.  He has had low-grade fever and myalgias.  He denied any significant cough.  Denied any dizziness.  No nausea and vomiting.  Patient was seen in the ER and evaluated.  He is not hypoxic but is very tachycardic.  His chest x-ray showed no mild infiltrates.  Patient has been admitted for aggressive hydration and management.  Due to his comorbidities and now being very still symptomatic.Marland Kitchen.  ED Course: Temperature 100.5, blood pressure 150/103, pulse 123 respiratory of 35 and oxygen sat is 95% on room air.  White count is 11.4 hemoglobin 13.9 and platelets 252.  Sodium 134 potassium 4.9 and CO2 18.  Glucose 116.  D-dimer 3.84, fibrinogen 652.  Repeat COVID-19 testing is pending.  Chest x-ray showed ill-defined airspace opacity consistent with pneumonia in medial right base.  Patient will be admitted for treatment.  Review of Systems: As per HPI otherwise 10 point review of systems negative.    Past Medical History:  Diagnosis Date  . Atypical chest pain 05/27/2016   a. 05/2016: NST showing small area of moderare anteroapical ischemia b. 05/2016: cath showing tortous but normal cors which confirmed a false positive NST.  . Diabetes mellitus without complication (HCC)   . GERD (gastroesophageal reflux disease) 10/02/2016   . Hypertension     Past Surgical History:  Procedure Laterality Date  . CARDIAC CATHETERIZATION N/A 06/10/2016   Procedure: Left Heart Cath and Coronary Angiography;  Surgeon: William OuchMuhammad A Arida, Moss;  Location: [facility];  Service: Cardiovascular;  Laterality: N/A;  . COLONOSCOPY N/A 02/04/2016   Procedure: COLONOSCOPY;  Surgeon: Hilarie FredricksonJohn N Perry, Moss;  Location: [facility];  Service: Endoscopy;  Laterality: N/A;  . NO PAST SURGERIES       reports that he has never smoked. He has never used smokeless tobacco. He reports that he does not drink alcohol or use drugs.  No Known Allergies  Family History  Problem Relation Age of Onset  . Diabetes Mother   . Diabetes Father   . Cancer Maternal Grandmother   . Breast cancer Cousin   . Alzheimer's disease Paternal Grandmother      Prior to Admission medications   Medication Sig Start Date End Date Taking? Authorizing Provider  amLODipine (NORVASC) 10 MG tablet Take 1 tablet (10 mg total) by mouth daily. 05/15/19 08/13/19 Yes William Moss, William Moss  atorvastatin (LIPITOR) 20 MG tablet Take 20 mg by mouth daily. 06/04/19  Yes Provider, Historical, Moss  carvedilol (COREG) 25 MG tablet Take 1 tablet (25 mg total) by mouth 2 (two) times daily. 05/15/19  Yes Newlin, Odette HornsEnobong, Moss  glipiZIDE (GLUCOTROL) 10 MG tablet Take 10 mg by mouth 2 (two) times daily. 06/04/19  Yes Provider, Historical, Moss  liraglutide (VICTOZA) 18 MG/3ML SOPN Inject subcutaneously daily with breakfast 0.6 mg daily for 1 week then 1.2 mg daily for 1 week  then 1.8 mg daily thereafter 05/15/19  Yes Newlin, William Moss  lisinopril (ZESTRIL) 10 MG tablet Take 1 tablet (10 mg total) by mouth daily. 05/15/19  Yes Charlott Rakes, Moss  Blood Glucose Monitoring Suppl (TRUE METRIX METER) DEVI 1 each by Does not apply route 3 (three) times daily before meals. 07/29/15   Charlott Rakes, Moss  glipiZIDE (GLUCOTROL) 5 MG tablet Take 1 tablet (5 mg total) by mouth 2 (two) times daily before a meal.  Patient not taking: Reported on 06/27/2019 05/15/19   Charlott Rakes, Moss  glucose blood (TRUE METRIX BLOOD GLUCOSE TEST) test strip 3 times daily before meals 07/29/15   Charlott Rakes, Moss  Insulin Pen Needle (TRUEPLUS PEN NEEDLES) 32G X 4 MM MISC Use as instructed with Victoza. 02/21/19   Charlott Rakes, Moss  metFORMIN (GLUCOPHAGE) 500 MG tablet Take 2 tablets (1,000 mg total) by mouth 2 (two) times daily with a meal. 05/15/19   Charlott Rakes, Moss  TRUEPLUS LANCETS 28G MISC 1 each by Does not apply route 3 (three) times daily before meals. 07/29/15   Charlott Rakes, Moss    Physical Exam: Vitals:   06/27/19 1751 06/27/19 1752 06/27/19 2104  BP: (!) 158/103 (!) 158/103 134/85  Pulse: (!) 123 (!) 115 (!) 109  Resp: (!) 35 (!) 33 (!) 32  Temp:  (!) 100.5 F (38.1 C) 99.6 F (37.6 C)  TempSrc:  Oral Oral  SpO2: 94% 95% 94%      Constitutional: Anxious, weak, morbidly obese Vitals:   06/27/19 1751 06/27/19 1752 06/27/19 2104  BP: (!) 158/103 (!) 158/103 134/85  Pulse: (!) 123 (!) 115 (!) 109  Resp: (!) 35 (!) 33 (!) 32  Temp:  (!) 100.5 F (38.1 C) 99.6 F (37.6 C)  TempSrc:  Oral Oral  SpO2: 94% 95% 94%   Eyes: PERRL, lids and conjunctivae normal ENMT: Mucous membranes are moist. Posterior pharynx clear of any exudate or lesions.Normal dentition.  Neck: normal, supple, no masses, no thyromegaly Respiratory: clear to auscultation bilaterally, no wheezing, no crackles. Normal respiratory effort. No accessory muscle use.  Mild rhonchi Cardiovascular: Sinus tachycardia, no murmurs / rubs / gallops. No extremity edema. 2+ pedal pulses. No carotid bruits.  Abdomen: no tenderness, no masses palpated. No hepatosplenomegaly. Bowel sounds positive.  Musculoskeletal: no clubbing / cyanosis. No joint deformity upper and lower extremities. Good ROM, no contractures. Normal muscle tone.  Skin: no rashes, lesions, ulcers. No induration Neurologic: CN 2-12 grossly intact. Sensation intact, DTR  normal. Strength 5/5 in all 4.  Psychiatric: Normal judgment and insight. Alert and oriented x 3. Normal mood.     Labs on Admission: I have personally reviewed following labs and imaging studies  CBC: Recent Labs  Lab 06/27/19 1824  WBC 11.4*  NEUTROABS 7.5  HGB 13.9  HCT 42.8  MCV 84.4  PLT 161   Basic Metabolic Panel: Recent Labs  Lab 06/27/19 1824  NA 134*  K 4.9  CL 102  CO2 18*  GLUCOSE 116*  BUN 12  CREATININE 1.13  CALCIUM 9.0   GFR: CrCl cannot be calculated (Unknown ideal weight.). Liver Function Tests: Recent Labs  Lab 06/27/19 1824  AST 29  ALT 29  ALKPHOS 65  BILITOT 1.5*  PROT 8.9*  ALBUMIN 3.9   No results for input(s): LIPASE, AMYLASE in the last 168 hours. No results for input(s): AMMONIA in the last 168 hours. Coagulation Profile: No results for input(s): INR, PROTIME in the last 168 hours. Cardiac Enzymes: No  results for input(s): CKTOTAL, CKMB, CKMBINDEX, TROPONINI in the last 168 hours. BNP (last 3 results) No results for input(s): PROBNP in the last 8760 hours. HbA1C: No results for input(s): HGBA1C in the last 72 hours. CBG: No results for input(s): GLUCAP in the last 168 hours. Lipid Profile: Recent Labs    06/27/19 1824  TRIG 109   Thyroid Function Tests: No results for input(s): TSH, T4TOTAL, FREET4, T3FREE, THYROIDAB in the last 72 hours. Anemia Panel: Recent Labs    06/27/19 1824  FERRITIN 103   Urine analysis:    Component Value Date/Time   COLORURINE STRAW (A) 01/18/2019 1840   APPEARANCEUR CLEAR 01/18/2019 1840   LABSPEC 1.021 01/18/2019 1840   PHURINE 5.0 01/18/2019 1840   GLUCOSEU >=500 (A) 01/18/2019 1840   HGBUR NEGATIVE 01/18/2019 1840   BILIRUBINUR NEGATIVE 01/18/2019 1840   KETONESUR NEGATIVE 01/18/2019 1840   PROTEINUR NEGATIVE 01/18/2019 1840   NITRITE NEGATIVE 01/18/2019 1840   LEUKOCYTESUR NEGATIVE 01/18/2019 1840   Sepsis Labs: @LABRCNTIP (procalcitonin:4,lacticidven:4) )No results found  for this or any previous visit (from the past 240 hour(s)).   Radiological Exams on Admission: Dg Chest Port 1 View  Result Date: 06/27/2019 CLINICAL DATA:  COVID-19 positive. Shortness of breath with chest pain EXAM: PORTABLE CHEST 1 VIEW COMPARISON:  Jan 18, 2019 FINDINGS: There is ill-defined opacity in the medial right base. The lungs elsewhere are clear. Heart is upper normal in size with pulmonary vascularity normal. No adenopathy. No bone lesions. IMPRESSION: Ill-defined airspace opacity consistent with pneumonia medial right base. No consolidation. Lungs elsewhere clear. Heart upper normal in size. No evident adenopathy. Electronically Signed   By: Jan 20, 2019 III M.D.   On: 06/27/2019 19:54    EKG: Independently reviewed.  It shows sinus tachycardia with a rate of 114.  No significant ST changes.  Assessment/Plan Principal Problem:   Sinus tachycardia Active Problems:   Essential hypertension   Morbid obesity (HCC)   Non-insulin dependent type 2 diabetes mellitus (HCC)   Coronary artery disease involving native heart   GERD (gastroesophageal reflux disease)   COVID-19 virus infection     #1 persistent sinus tachycardia: Most likely due to COVID-19 pneumonia with dehydration.  Patient has the potential of getting worse suddenly.  We will therefore admit to the hospital.  Aggressively hydrate.  Telemetry.  Supportive care.  #2 COVID-19 infection: Relatively stable.  Not hypoxic.  Still has shortness of breath especially with activities.  He does have early infiltrates.  Would likely not benefit from remdesivir based on current criteria.  We will do supportive care only.  We will proactively treat the pneumonia based on chest x-ray.  #3 early sepsis: Based on SIRS and the pulmonary infiltrate.  Will treat with Rocephin and Zithromax.  #4 diabetes: We will hold Metformin and initiate sliding scale insulin  #5 hypertension: Resume home regimen and maintain  #6 morbid  obesity: Dietary counseling  #7 GERD: Continue PPIs   DVT prophylaxis: Lovenox Code Status: Full code Family Communication: Discussed care with patient Disposition Plan: Home Consults called: None Admission status: Observation  Severity of Illness: The appropriate patient status for this patient is OBSERVATION. Observation status is judged to be reasonable and necessary in order to provide the required intensity of service to ensure the patient's safety. The patient's presenting symptoms, physical exam findings, and initial radiographic and laboratory data in the context of their medical condition is felt to place them at decreased risk for further clinical deterioration. Furthermore, it is  anticipated that the patient will be medically stable for discharge from the hospital within 2 midnights of admission. The following factors support the patient status of observation.   " The patient's presenting symptoms include shortness of breath and sinus tachycardia. " The physical exam findings include palpitations. " The initial radiographic and laboratory data are elevated D-dimer and fibrinogen.     Lonia Blood Moss [facility] Pager [phone]  If 7PM-7AM, please contact night-coverage [email or web address] Password [facility]  06/27/2019, 9:33 PM

## 2019-06-27 NOTE — ED Provider Notes (Signed)
[facility]-EMERGENCY DEPT Provider Note   CSN: [id number] Arrival date & time: 06/27/19  1720     History   Chief Complaint Chief Complaint  Patient presents with  . covid+  . Shortness of Breath    HPI William Moss is a 33 y.o. male.     HPI Patient presents to the ED for evaluation of chest pain and shortness of breath.  Patient took a Covid test this past Saturday.  He was informed that his results were positive.  Patient states initially felt like he was doing okay.  In the last day or 2 has had increasing symptoms of right-sided chest pain and shortness of breath.  The symptoms increase with coughing and deep breathing.  He has had fevers and some myalgias.  He had some loose stools but no vomiting.  Patient called his doctor with his worsening symptoms and he was instructed to come to the ED. Past Medical History:  Diagnosis Date  . Atypical chest pain 05/27/2016   a. 05/2016: NST showing small area of moderare anteroapical ischemia b. 05/2016: cath showing tortous but normal cors which confirmed a false positive NST.  . Diabetes mellitus without complication (HCC)   . GERD (gastroesophageal reflux disease) 10/02/2016  . Hypertension     Patient Active Problem List   Diagnosis Date Noted  . Sleep apnea 03/27/2019  . Normal coronary arteries 03/27/2019  . GERD (gastroesophageal reflux disease) 10/02/2016  . Coronary artery disease involving native heart   . Abnormal nuclear stress test   . Atypical chest pain 05/27/2016  . Non-insulin dependent type 2 diabetes mellitus (HCC) 09/30/2015  . Hematochezia 09/30/2015  . Essential hypertension 07/29/2015  . Morbid obesity (HCC) 07/29/2015    Past Surgical History:  Procedure Laterality Date  . CARDIAC CATHETERIZATION N/A 06/10/2016   Procedure: Left Heart Cath and Coronary Angiography;  Surgeon: Iran OuchMuhammad A Arida, MD;  Location: [facility];  Service: Cardiovascular;  Laterality: N/A;  .  COLONOSCOPY N/A 02/04/2016   Procedure: COLONOSCOPY;  Surgeon: Hilarie FredricksonJohn N Perry, MD;  Location: [facility];  Service: Endoscopy;  Laterality: N/A;  . NO PAST SURGERIES          Home Medications    Prior to Admission medications   Medication Sig Start Date End Date Taking? Authorizing Provider  amLODipine (NORVASC) 10 MG tablet Take 1 tablet (10 mg total) by mouth daily. 05/15/19 08/13/19 Yes Hoy RegisterNewlin, Enobong, MD  atorvastatin (LIPITOR) 20 MG tablet Take 20 mg by mouth daily. 06/04/19  Yes [provider]  carvedilol (COREG) 25 MG tablet Take 1 tablet (25 mg total) by mouth 2 (two) times daily. 05/15/19  Yes Newlin, Odette HornsEnobong, MD  glipiZIDE (GLUCOTROL) 10 MG tablet Take 10 mg by mouth 2 (two) times daily. 06/04/19  Yes [provider]  liraglutide (VICTOZA) 18 MG/3ML SOPN Inject subcutaneously daily with breakfast 0.6 mg daily for 1 week then 1.2 mg daily for 1 week then 1.8 mg daily thereafter 05/15/19  Yes Newlin, Enobong, MD  lisinopril (ZESTRIL) 10 MG tablet Take 1 tablet (10 mg total) by mouth daily. 05/15/19  Yes Hoy RegisterNewlin, Enobong, MD  Blood Glucose Monitoring Suppl (TRUE METRIX METER) DEVI 1 each by Does not apply route 3 (three) times daily before meals. 07/29/15   Hoy RegisterNewlin, Enobong, MD  glipiZIDE (GLUCOTROL) 5 MG tablet Take 1 tablet (5 mg total) by mouth 2 (two) times daily before a meal. Patient not taking: Reported on 06/27/2019 05/15/19   Hoy RegisterNewlin, Enobong, MD  glucose blood (TRUE METRIX BLOOD GLUCOSE TEST) test strip 3 times daily before meals 07/29/15   Hoy Register, MD  Insulin Pen Needle (TRUEPLUS PEN NEEDLES) 32G X 4 MM MISC Use as instructed with Victoza. 02/21/19   Hoy Register, MD  metFORMIN (GLUCOPHAGE) 500 MG tablet Take 2 tablets (1,000 mg total) by mouth 2 (two) times daily with a meal. 05/15/19   Hoy Register, MD  TRUEPLUS LANCETS 28G MISC 1 each by Does not apply route 3 (three) times daily before meals. 07/29/15   Hoy Register, MD    Family History  Family History  Problem Relation Age of Onset  . Diabetes Mother   . Diabetes Father   . Cancer Maternal Grandmother   . Breast cancer Cousin   . Alzheimer's disease Paternal Grandmother     Social History Social History   Tobacco Use  . Smoking status: Never Smoker  . Smokeless tobacco: Never Used  Substance Use Topics  . Alcohol use: No  . Drug use: No     Allergies   Patient has no known allergies.   Review of Systems Review of Systems  All other systems reviewed and are negative.    Physical Exam Updated Vital Signs BP 134/85 (BP Location: Left Arm)   Pulse (!) 109   Temp 99.6 F (37.6 C) (Oral)   Resp (!) 32   SpO2 94%   Physical Exam Vitals signs and nursing note reviewed.  Constitutional:      General: He is not in acute distress.    Appearance: He is obese.  HENT:     Head: Normocephalic and atraumatic.     Right Ear: External ear normal.     Left Ear: External ear normal.  Eyes:     General: No scleral icterus.       Right eye: No discharge.        Left eye: No discharge.     Conjunctiva/sclera: Conjunctivae normal.  Neck:     Musculoskeletal: Neck supple.     Trachea: No tracheal deviation.  Cardiovascular:     Rate and Rhythm: Regular rhythm. Tachycardia present.  Pulmonary:     Effort: Pulmonary effort is normal. Tachypnea present. No respiratory distress.     Breath sounds: Normal breath sounds. No stridor. No wheezing or rales.  Abdominal:     General: Bowel sounds are normal. There is no distension.     Palpations: Abdomen is soft.     Tenderness: There is no abdominal tenderness. There is no guarding or rebound.  Musculoskeletal:        General: No tenderness.  Skin:    General: Skin is warm and dry.     Findings: No rash.  Neurological:     Mental Status: He is alert.     Cranial Nerves: No cranial nerve deficit (no facial droop, extraocular movements intact, no slurred speech).     Sensory: No sensory deficit.     Motor: No  abnormal muscle tone or seizure activity.     Coordination: Coordination normal.      ED Treatments / Results  Labs (all labs ordered are listed, but only abnormal results are displayed) Labs Reviewed  LACTIC ACID, PLASMA - Abnormal; Notable for the following components:      Result Value   Lactic Acid, Venous 2.0 (*)    All other components within normal limits  CBC WITH DIFFERENTIAL/PLATELET - Abnormal; Notable for the following components:   WBC 11.4 (*)  Monocytes Absolute 1.7 (*)    All other components within normal limits  COMPREHENSIVE METABOLIC PANEL - Abnormal; Notable for the following components:   Sodium 134 (*)    CO2 18 (*)    Glucose, Bld 116 (*)    Total Protein 8.9 (*)    Total Bilirubin 1.5 (*)    All other components within normal limits  LACTATE DEHYDROGENASE - Abnormal; Notable for the following components:   LDH 387 (*)    All other components within normal limits  C-REACTIVE PROTEIN - Abnormal; Notable for the following components:   CRP 17.6 (*)    All other components within normal limits  D-DIMER, QUANTITATIVE (NOT AT [facility]) - Abnormal; Notable for the following components:   D-Dimer, Quant 3.84 (*)    All other components within normal limits  FIBRINOGEN - Abnormal; Notable for the following components:   Fibrinogen 652 (*)    All other components within normal limits  CULTURE, BLOOD (ROUTINE X 2)  CULTURE, BLOOD (ROUTINE X 2)  SARS CORONAVIRUS 2 (TAT 6-24 HRS)  PROCALCITONIN  FERRITIN  TRIGLYCERIDES  LACTIC ACID, PLASMA    EKG EKG Interpretation  Date/Time:  Tuesday June 27 2019 18:25:36 EDT Ventricular Rate:  119 PR Interval:    QRS Duration: 100 QT Interval:  321 QTC Calculation: 452 R Axis:   9 Text Interpretation: Sinus tachycardia Artifact Confirmed by Dorie Rank [phone]) on 06/27/2019 6:31:41 PM   Radiology Dg Chest Port 1 View  Result Date: 06/27/2019 CLINICAL DATA:  COVID-19 positive. Shortness of breath with chest  pain EXAM: PORTABLE CHEST 1 VIEW COMPARISON:  Jan 18, 2019 FINDINGS: There is ill-defined opacity in the medial right base. The lungs elsewhere are clear. Heart is upper normal in size with pulmonary vascularity normal. No adenopathy. No bone lesions. IMPRESSION: Ill-defined airspace opacity consistent with pneumonia medial right base. No consolidation. Lungs elsewhere clear. Heart upper normal in size. No evident adenopathy. Electronically Signed   By: Lowella Grip III M.D.   On: 06/27/2019 19:54    Procedures Procedures (including critical care time)  Medications Ordered in ED Medications  0.9 %  sodium chloride infusion (1,000 mLs Intravenous New Bag/Given 06/27/19 1825)  acetaminophen (TYLENOL) tablet 650 mg (650 mg Oral Given 06/27/19 1825)     Initial Impression / Assessment and Plan / ED Course  I have reviewed the triage vital signs and the nursing notes.  Pertinent labs & imaging results that were available during my care of the patient were reviewed by me and considered in my medical decision making (see chart for details).  Clinical Course as of Jun 27 2123  Tue Jun 27, 2019  1948 Labs reviewed.  Patient has an elevated lactic acid level and white blood cell count.  LDH and D-dimer also elevated.  This is in the setting of known COVID-19 infection.   [JK]  2124 Chest x-ray shows possible pneumonia right side.  Patient's tachycardia has improved with treatment.  He does remain tachypneic however.   [JK]    Clinical Course User Index [JK] Dorie Rank, MD     Patient presented to the ED for evaluation of chest pain associated with COVID-19 infection.  His x-ray does show findings suggestive of possible pneumonia.  His laboratory tests are abnormal consistent with COVID-19 infection.  Patient remains tachypneic and tachycardic.  He is oxygenating well but with his vital sign abnormalities I do think he warrants admission for further observation and treatment.  William Moss  was  evaluated in Emergency Department on 06/27/2019 for the symptoms described in the history of present illness. He was evaluated in the context of the global COVID-19 pandemic, which necessitated consideration that the patient might be at risk for infection with the SARS-CoV-2 virus that causes COVID-19. Institutional protocols and algorithms that pertain to the evaluation of patients at risk for COVID-19 are in a state of rapid change based on information released by regulatory bodies including the CDC and federal and state organizations. These policies and algorithms were followed during the patient's care in the ED.   Final Clinical Impressions(s) / ED Diagnoses   Final diagnoses:  Pneumonia due to COVID-19 virus      Linwood Dibbles, MD 06/27/19 2125

## 2019-06-27 NOTE — ED Notes (Signed)
Patient SpO2 90-92% on RA. Placed patient on 2L of O2 via [place] with an improvement to 96%. Admitting MD made aware of new oxygen need.

## 2019-06-27 NOTE — ED Notes (Signed)
ED Provider at bedside. 

## 2019-06-27 NOTE — ED Notes (Addendum)
Report called to [place] at [facility]. [facility] contacted for transport. Paperwork printed and at nursing station.

## 2019-06-27 NOTE — ED Triage Notes (Signed)
Pt reports was positive for Covid last week. Reports worsening SOb since Sunday. C/o pains with breathing in right chest and back. Denies fevers.

## 2019-06-28 DIAGNOSIS — E118 Type 2 diabetes mellitus with unspecified complications: Secondary | ICD-10-CM

## 2019-06-28 DIAGNOSIS — J9601 Acute respiratory failure with hypoxia: Secondary | ICD-10-CM | POA: Diagnosis present

## 2019-06-28 DIAGNOSIS — J1282 Pneumonia due to coronavirus disease 2019: Secondary | ICD-10-CM | POA: Diagnosis present

## 2019-06-28 DIAGNOSIS — J9621 Acute and chronic respiratory failure with hypoxia: Secondary | ICD-10-CM | POA: Diagnosis present

## 2019-06-28 DIAGNOSIS — IMO0002 Reserved for concepts with insufficient information to code with codable children: Secondary | ICD-10-CM | POA: Diagnosis present

## 2019-06-28 DIAGNOSIS — G4730 Sleep apnea, unspecified: Secondary | ICD-10-CM

## 2019-06-28 DIAGNOSIS — U071 COVID-19: Secondary | ICD-10-CM | POA: Diagnosis present

## 2019-06-28 DIAGNOSIS — I10 Essential (primary) hypertension: Secondary | ICD-10-CM

## 2019-06-28 DIAGNOSIS — J1289 Other viral pneumonia: Secondary | ICD-10-CM

## 2019-06-28 DIAGNOSIS — E1165 Type 2 diabetes mellitus with hyperglycemia: Secondary | ICD-10-CM

## 2019-06-28 LAB — CBC WITH DIFFERENTIAL/PLATELET
Abs Immature Granulocytes: 0.05 10*3/uL (ref 0.00–0.07)
Basophils Absolute: 0 10*3/uL (ref 0.0–0.1)
Basophils Relative: 0 %
Eosinophils Absolute: 0.2 10*3/uL (ref 0.0–0.5)
Eosinophils Relative: 2 %
HCT: 35.6 % — ABNORMAL LOW (ref 39.0–52.0)
Hemoglobin: 11.7 g/dL — ABNORMAL LOW (ref 13.0–17.0)
Immature Granulocytes: 1 %
Lymphocytes Relative: 20 %
Lymphs Abs: 1.9 10*3/uL (ref 0.7–4.0)
MCH: 27.3 pg (ref 26.0–34.0)
MCHC: 32.9 g/dL (ref 30.0–36.0)
MCV: 83.2 fL (ref 80.0–100.0)
Monocytes Absolute: 1.1 10*3/uL — ABNORMAL HIGH (ref 0.1–1.0)
Monocytes Relative: 12 %
Neutro Abs: 6.2 10*3/uL (ref 1.7–7.7)
Neutrophils Relative %: 65 %
Platelets: 252 10*3/uL (ref 150–400)
RBC: 4.28 MIL/uL (ref 4.22–5.81)
RDW: 14.5 % (ref 11.5–15.5)
WBC: 9.6 10*3/uL (ref 4.0–10.5)
nRBC: 0 % (ref 0.0–0.2)

## 2019-06-28 LAB — COMPREHENSIVE METABOLIC PANEL
ALT: 20 U/L (ref 0–44)
AST: 15 U/L (ref 15–41)
Albumin: 3.3 g/dL — ABNORMAL LOW (ref 3.5–5.0)
Alkaline Phosphatase: 57 U/L (ref 38–126)
Anion gap: 10 (ref 5–15)
BUN: 11 mg/dL (ref 6–20)
CO2: 20 mmol/L — ABNORMAL LOW (ref 22–32)
Calcium: 8.8 mg/dL — ABNORMAL LOW (ref 8.9–10.3)
Chloride: 107 mmol/L (ref 98–111)
Creatinine, Ser: 0.96 mg/dL (ref 0.61–1.24)
GFR calc Af Amer: >60 mL/min (ref >60)
GFR calc non Af Amer: >60 mL/min (ref >60)
Glucose, Bld: 163 mg/dL — ABNORMAL HIGH (ref 70–99)
Potassium: 3.6 mmol/L (ref 3.5–5.1)
Sodium: 137 mmol/L (ref 135–145)
Total Bilirubin: 1 mg/dL (ref 0.3–1.2)
Total Protein: 7.6 g/dL (ref 6.5–8.1)

## 2019-06-28 LAB — PHOSPHORUS: Phosphorus: 2.3 mg/dL — ABNORMAL LOW (ref 2.5–4.6)

## 2019-06-28 LAB — FERRITIN: Ferritin: 119 ng/mL (ref 24–336)

## 2019-06-28 LAB — LACTATE DEHYDROGENASE: LDH: 186 U/L (ref 98–192)

## 2019-06-28 LAB — GLUCOSE, CAPILLARY
Glucose-Capillary: 143 mg/dL — ABNORMAL HIGH (ref 70–99)
Glucose-Capillary: 153 mg/dL — ABNORMAL HIGH (ref 70–99)
Glucose-Capillary: 160 mg/dL — ABNORMAL HIGH (ref 70–99)
Glucose-Capillary: 262 mg/dL — ABNORMAL HIGH (ref 70–99)

## 2019-06-28 LAB — PROCALCITONIN: Procalcitonin: <0.1 ng/mL

## 2019-06-28 LAB — D-DIMER, QUANTITATIVE: D-Dimer, Quant: 3.88 ug/mL-FEU — ABNORMAL HIGH (ref 0.00–0.50)

## 2019-06-28 LAB — MAGNESIUM: Magnesium: 1.6 mg/dL — ABNORMAL LOW (ref 1.7–2.4)

## 2019-06-28 LAB — SARS CORONAVIRUS 2 (TAT 6-24 HRS): SARS Coronavirus 2: POSITIVE — AB

## 2019-06-28 MED ORDER — SODIUM CHLORIDE 0.9 % IV SOLN
INTRAVENOUS | Status: DC
  Administered 2019-06-28 – 2019-07-02 (×12): via INTRAVENOUS

## 2019-06-28 MED ORDER — INSULIN ASPART 100 UNIT/ML ~~LOC~~ SOLN
0.0000 [IU] | Freq: Every day | SUBCUTANEOUS | Status: DC
  Filled 2019-06-28: qty 0.05

## 2019-06-28 MED ORDER — DEXAMETHASONE 6 MG PO TABS
6.0000 mg | ORAL_TABLET | Freq: Every day | ORAL | Status: DC
  Administered 2019-06-28 – 2019-07-02 (×5): 6 mg via ORAL
  Filled 2019-06-28 (×5): qty 1

## 2019-06-28 MED ORDER — LISINOPRIL 10 MG PO TABS
10.0000 mg | ORAL_TABLET | Freq: Every day | ORAL | Status: DC
  Administered 2019-06-28 – 2019-06-29 (×2): 10 mg via ORAL
  Filled 2019-06-28 (×2): qty 1

## 2019-06-28 MED ORDER — IPRATROPIUM-ALBUTEROL 20-100 MCG/ACT IN AERS
1.0000 | INHALATION_SPRAY | Freq: Four times a day (QID) | RESPIRATORY_TRACT | Status: DC
  Administered 2019-06-28 – 2019-07-02 (×16): 1 via RESPIRATORY_TRACT
  Filled 2019-06-28: qty 4

## 2019-06-28 MED ORDER — ONDANSETRON HCL 4 MG PO TABS: 4.0000 mg | ORAL_TABLET | Freq: Four times a day (QID) | ORAL | Status: DC | PRN

## 2019-06-28 MED ORDER — AMLODIPINE BESYLATE 10 MG PO TABS
10.0000 mg | ORAL_TABLET | Freq: Every day | ORAL | Status: DC
  Administered 2019-06-28 – 2019-07-01 (×4): 10 mg via ORAL
  Filled 2019-06-28 (×4): qty 1

## 2019-06-28 MED ORDER — SODIUM CHLORIDE 0.9 % IV SOLN
100.0000 mg | INTRAVENOUS | Status: AC
  Administered 2019-06-29 – 2019-07-02 (×4): 100 mg via INTRAVENOUS
  Filled 2019-06-28 (×4): qty 20

## 2019-06-28 MED ORDER — INSULIN ASPART 100 UNIT/ML ~~LOC~~ SOLN
0.0000 [IU] | Freq: Three times a day (TID) | SUBCUTANEOUS | Status: DC
  Administered 2019-06-28: 1 [IU] via SUBCUTANEOUS
  Administered 2019-06-28: 2 [IU] via SUBCUTANEOUS
  Filled 2019-06-28: qty 0.09

## 2019-06-28 MED ORDER — CARVEDILOL 12.5 MG PO TABS
25.0000 mg | ORAL_TABLET | Freq: Two times a day (BID) | ORAL | Status: DC
  Administered 2019-06-28 – 2019-07-01 (×7): 25 mg via ORAL
  Filled 2019-06-28 (×7): qty 2

## 2019-06-28 MED ORDER — ONDANSETRON HCL 4 MG/2ML IJ SOLN: 4.0000 mg | Freq: Four times a day (QID) | INTRAMUSCULAR | Status: DC | PRN

## 2019-06-28 MED ORDER — ENOXAPARIN SODIUM 80 MG/0.8ML ~~LOC~~ SOLN
80.0000 mg | SUBCUTANEOUS | Status: DC
  Administered 2019-06-29 – 2019-07-02 (×4): 80 mg via SUBCUTANEOUS
  Filled 2019-06-28 (×4): qty 0.8

## 2019-06-28 MED ORDER — ATORVASTATIN CALCIUM 10 MG PO TABS
20.0000 mg | ORAL_TABLET | Freq: Every day | ORAL | Status: DC
  Administered 2019-06-28 – 2019-06-29 (×2): 20 mg via ORAL
  Filled 2019-06-28 (×2): qty 2

## 2019-06-28 MED ORDER — SODIUM CHLORIDE 0.9 % IV SOLN
200.0000 mg | Freq: Once | INTRAVENOUS | Status: AC
  Administered 2019-06-28: 200 mg via INTRAVENOUS
  Filled 2019-06-28: qty 40

## 2019-06-28 MED ORDER — INSULIN ASPART 100 UNIT/ML ~~LOC~~ SOLN
0.0000 [IU] | SUBCUTANEOUS | Status: DC
  Administered 2019-06-28: 11 [IU] via SUBCUTANEOUS
  Administered 2019-06-28: 4 [IU] via SUBCUTANEOUS
  Administered 2019-06-29: 3 [IU] via SUBCUTANEOUS
  Administered 2019-06-29 (×2): 7 [IU] via SUBCUTANEOUS
  Administered 2019-06-29: 4 [IU] via SUBCUTANEOUS
  Administered 2019-06-29: 3 [IU] via SUBCUTANEOUS
  Administered 2019-06-29: 11 [IU] via SUBCUTANEOUS
  Administered 2019-06-30: 15 [IU] via SUBCUTANEOUS
  Administered 2019-06-30: 11 [IU] via SUBCUTANEOUS
  Administered 2019-06-30: 4 [IU] via SUBCUTANEOUS
  Administered 2019-06-30: 11 [IU] via SUBCUTANEOUS
  Administered 2019-06-30: 3 [IU] via SUBCUTANEOUS
  Administered 2019-06-30 (×2): 4 [IU] via SUBCUTANEOUS
  Administered 2019-07-01: 15 [IU] via SUBCUTANEOUS
  Administered 2019-07-01: 4 [IU] via SUBCUTANEOUS
  Administered 2019-07-01: 20 [IU] via SUBCUTANEOUS
  Administered 2019-07-01: 4 [IU] via SUBCUTANEOUS
  Administered 2019-07-02: 01:00:00 7 [IU] via SUBCUTANEOUS
  Administered 2019-07-02: 03:00:00 4 [IU] via SUBCUTANEOUS
  Administered 2019-07-02: 08:00:00 3 [IU] via SUBCUTANEOUS

## 2019-06-28 MED ORDER — ENOXAPARIN SODIUM 40 MG/0.4ML ~~LOC~~ SOLN
40.0000 mg | Freq: Once | SUBCUTANEOUS | Status: AC
  Administered 2019-06-28: 40 mg via SUBCUTANEOUS
  Filled 2019-06-28: qty 0.4

## 2019-06-28 MED ORDER — ENOXAPARIN SODIUM 40 MG/0.4ML ~~LOC~~ SOLN
40.0000 mg | SUBCUTANEOUS | Status: DC
  Administered 2019-06-28: 40 mg via SUBCUTANEOUS
  Filled 2019-06-28: qty 0.4

## 2019-06-28 MED ORDER — ACETAMINOPHEN 325 MG PO TABS: 650.0000 mg | ORAL_TABLET | Freq: Four times a day (QID) | ORAL | Status: DC | PRN

## 2019-06-28 NOTE — ED Notes (Signed)
[facility] at bedside to transport patient to [facility]. Patient in NAD at this time. [place] RN notified.

## 2019-06-28 NOTE — ED Notes (Signed)
Patient repositioned in bed for comfort.

## 2019-06-28 NOTE — Progress Notes (Signed)
Inpatient Diabetes Program Recommendations  AACE/ADA: New Consensus Statement on Inpatient Glycemic Control   Target Ranges:  Prepandial:   less than 140 mg/dL      Peak postprandial:   less than 180 mg/dL (1-2 hours)      Critically ill patients:  140 - 180 mg/dL   Results for CHIPPER, KOUDELKA (MRN [id number]) as of 06/28/2019 14:16  Ref. Range 06/28/2019 07:22 06/28/2019 11:30  Glucose-Capillary Latest Ref Range: 70 - 99 mg/dL 143 (H) 153 (H)   Review of Glycemic Control  Diabetes history: DM2 Outpatient Diabetes medications: Glipizide 10 mg BID, Metformin 1000 mg BID, Victoza 1.8 mg daily Current orders for Inpatient glycemic control: Novolog 0-20 units Q4H; Decadron 6 mg daily  Inpatient Diabetes Program Recommendations:   Insulin-Meal Coverage: While ordered steroids, please consider ordering Novolog 3 units TID with meals for meal coverage if patient eats at least 50% of meals.  NOTE: Noted consult for Diabetes Coordinator. Chart reviewed. Noted patient seen PCP on 05/15/19 and A1C was noted to be 6.0% (improved from 10.2% in June 2020). Patient was started on Victoza in June 2020. Per PCP note on 05/15/19, patient reported glucose as low as 70 mg/dl without symptoms of hypoglycemia and reported Victoza caused him to lose appetite. Patient was instructed to decrease Glipizide from 10 mg to 5 mg BID to prevent hypoglycemia. Spoke with patient over the phone regarding DM control. Patient states that he is taking Glipizide 10 mg BID, Metformin 1000 mg BID, Victoza 1.8 mg daily for DM control as an outpatient.  Patient states that he did not decrease his Glipizide dose and he is tolerating Victoza 1.8 mg daily.  Patient states that he has not been checking glucose lately because he is out of test strips. Patient denies any symptoms of hypoglycemia. Patient confirms that his last A1C was 6% and glucose control has improved since starting the Victoza. Patient notes that he is going to be given insulin  while inpatient and he would prefer to use oral DM medications. Discussed use of insulin while inpatient as it is more predictable than oral DM medications and adjustments can be made daily with insulin if needed. Also explained that he is ordered Decadron which is a steroid and will likely lead to hyperglycemia. Patient agreeable to take insulin while inpatient and states understanding of information discussed and states that he does not have any questions at this time. At time of discharge, please provide Rx for: glucometer test strips.  Will continue to follow along while inpatient and make recommendations if needed based on glycemic control.  Thanks, Barnie Alderman, RN, MSN, CDE Diabetes Coordinator Inpatient Diabetes Program [phone] (Team Pager from 8am to 5pm)

## 2019-06-28 NOTE — Plan of Care (Signed)
Patient admitted for continued SOB after COVID diagnosis. Patient saturating well on 2L [place]. Afebrile on arrival.

## 2019-06-28 NOTE — Progress Notes (Signed)
Danny updated

## 2019-06-28 NOTE — Progress Notes (Signed)
PROGRESS NOTE    William Moss  PJK:[id number] DOB: 03-Jan-1986 DOA: 06/27/2019 PCP: Hoy Register, MD   Brief Narrative:  33 y.o. BM PMHx diabetes type 2 uncontrolled with complication, HTN, nonobstructive CAD, morbid obesity, HLD, DX COVID-19,   presents to the ER with progressive shortness of breath and right-sided chest pain in the last 2 days.  Patient was diagnosed apparently was COVID-19 in the outpatient setting last Saturday.  He was doing okay initially at home until the last 2 days.  He has had low-grade fever and myalgias.  He denied any significant cough.  Denied any dizziness.  No nausea and vomiting.  Patient was seen in the ER and evaluated.  He is not hypoxic but is very tachycardic.  His chest x-ray showed no mild infiltrates.  Patient has been admitted for aggressive hydration and management.  Due to his comorbidities and now being very still symptomatic.Marland Kitchen  ED Course: Temperature 100.5, blood pressure 150/103, pulse 123 respiratory of 35 and oxygen sat is 95% on room air.  White count is 11.4 hemoglobin 13.9 and platelets 252.  Sodium 134 potassium 4.9 and CO2 18.  Glucose 116.  D-dimer 3.84, fibrinogen 652.  Repeat COVID-19 testing is pending.  Chest x-ray showed ill-defined airspace opacity consistent with pneumonia in medial right base.  Patient will be admitted for treatment.   Subjective: 10/28 A/O x4, negative CP, positive S OB, negative abdominal pain, negative N/V.  Unsure of how he contracted Covid.   Assessment & Plan:   Principal Problem:   Sinus tachycardia Active Problems:   Essential hypertension   Morbid obesity (HCC)   Non-insulin dependent type 2 diabetes mellitus (HCC)   Coronary artery disease involving native heart   GERD (gastroesophageal reflux disease)   Sleep apnea   COVID-19 virus infection   Pneumonia due to COVID-19 virus   Diabetes mellitus type 2, uncontrolled, with complications (HCC)   Acute on chronic respiratory failure with  hypoxia (HCC)    Covid pneumonia/acute respiratory failure with hypoxia  -Decadron 6 mg daily -Remdesivir per pharmacy protocol -Patient counseled may require Actemra if he does not respond to Decadron and remdesivir.  Counseled that there is not a cure for Covid however these medications have shown antidotal benefit.  Patient has agreed to allow [place] to treat him with these agents.  OSA -See Covid pneumonia -Titrate O2 to maintain SPO2> 88%  Essential HTN -Amlodipine 10 mg daily -Coreg 25 mg BID -Lisinopril 10 mg daily  Sinus tachycardia -Patient had not been on HTN medication, should resolve monitor closely. -See above  Morbid obesity -2000 cal/day heart healthy/carb modified diet -Nutrition consult; uncontrolled diabetes, counseling on 2000 -calorie/day diet and control of diabetes -Diabetic coordinator; uncontrolled diabetic  Diabetes type 2 uncontrolled with complication -06/09/2018 hemoglobin A1c= 9.0 -Hemoglobin A1c pending -Lipid panel pending -Resistant SSI   DVT prophylaxis: Lovenox Code Status: Full Family Communication:  Disposition Plan: TBD   Consultants:  None  Procedures/Significant Events:  None   I have personally reviewed and interpreted all radiology studies and my findings are as above.  VENTILATOR SETTINGS:    Cultures 10/27 SARS coronavirus positive 10/27 blood pending    Antimicrobials: Anti-infectives (From admission, onward)   Start     Stop   06/29/19 1000  remdesivir 100 mg in sodium chloride 0.9 % 250 mL IVPB     07/03/19 0959   06/28/19 1400  remdesivir 200 mg in sodium chloride 0.9 % 250 mL IVPB  Devices    LINES / TUBES:      Continuous Infusions: . sodium chloride Stopped (06/28/19 0230)  . sodium chloride 125 mL/hr at 06/28/19 1334  . remdesivir 200 mg in NS 250 mL     Followed by  . [START ON 06/29/2019] remdesivir 100 mg in NS 250 mL       Objective: Vitals:   06/28/19 0726 06/28/19  1051 06/28/19 1100 06/28/19 1130  BP: 128/80 (!) 148/93 (!) 156/94 (!) 157/101  Pulse: (!) 101 (!) 110 (!) 104 (!) 101  Resp: (!) 26 17 (!) 28 (!) 30  Temp: 98.7 F (37.1 C)     TempSrc: Oral     SpO2: 97% 93% 93% 95%  Weight:      Height:        Intake/Output Summary (Last 24 hours) at 06/28/2019 1418 Last data filed at 06/28/2019 1249 Gross per 24 hour  Intake 2342.08 ml  Output 325 ml  Net 2017.08 ml   Filed Weights   06/28/19 0438  Weight: (!) 167 kg    Examination:  General: A/O x4, positive acute respiratory distress Eyes: negative scleral hemorrhage, negative anisocoria, negative icterus ENT: Negative Runny nose, negative gingival bleeding, Neck:  Negative scars, masses, torticollis, lymphadenopathy, JVD Lungs: Clear to auscultation bilaterally without wheezes or crackles Cardiovascular: Regular rate and rhythm without murmur gallop or rub normal S1 and S2 Abdomen: MORBIDLY OBESE negative abdominal pain, nondistended, positive soft, bowel sounds, no rebound, no ascites, no appreciable mass Extremities: No significant cyanosis, clubbing, or edema bilateral lower extremities Skin: Negative rashes, lesions, ulcers Psychiatric:  Negative depression, negative anxiety, negative fatigue, negative mania  Central nervous system:  Cranial nerves II through XII intact, tongue/uvula midline, all extremities muscle strength 5/5, sensation intact throughout,  negative dysarthria, negative expressive aphasia, negative receptive aphasia.  .     Data Reviewed: Care during the described time interval was provided by me .  I have reviewed this patient's available data, including medical history, events of note, physical examination, and all test results as part of my evaluation.   CBC: Recent Labs  Lab 06/27/19 1824 06/28/19 1330  WBC 11.4* 9.6  NEUTROABS 7.5 PENDING  HGB 13.9 11.7*  HCT 42.8 35.6*  MCV 84.4 83.2  PLT 252 709   Basic Metabolic Panel: Recent Labs  Lab  06/27/19 1824  NA 134*  K 4.9  CL 102  CO2 18*  GLUCOSE 116*  BUN 12  CREATININE 1.13  CALCIUM 9.0   GFR: Estimated Creatinine Clearance: 140.9 mL/min (by C-G formula based on SCr of 1.13 mg/dL). Liver Function Tests: Recent Labs  Lab 06/27/19 1824  AST 29  ALT 29  ALKPHOS 65  BILITOT 1.5*  PROT 8.9*  ALBUMIN 3.9   No results for input(s): LIPASE, AMYLASE in the last 168 hours. No results for input(s): AMMONIA in the last 168 hours. Coagulation Profile: No results for input(s): INR, PROTIME in the last 168 hours. Cardiac Enzymes: No results for input(s): CKTOTAL, CKMB, CKMBINDEX, TROPONINI in the last 168 hours. BNP (last 3 results) No results for input(s): PROBNP in the last 8760 hours. HbA1C: No results for input(s): HGBA1C in the last 72 hours. CBG: Recent Labs  Lab 06/28/19 0722 06/28/19 1130  GLUCAP 143* 153*   Lipid Profile: Recent Labs    06/27/19 1824  TRIG 109   Thyroid Function Tests: No results for input(s): TSH, T4TOTAL, FREET4, T3FREE, THYROIDAB in the last 72 hours. Anemia Panel: Recent Labs  06/27/19 1824  FERRITIN 103   Urine analysis:    Component Value Date/Time   COLORURINE STRAW (A) 01/18/2019 1840   APPEARANCEUR CLEAR 01/18/2019 1840   LABSPEC 1.021 01/18/2019 1840   PHURINE 5.0 01/18/2019 1840   GLUCOSEU >=500 (A) 01/18/2019 1840   HGBUR NEGATIVE 01/18/2019 1840   BILIRUBINUR NEGATIVE 01/18/2019 1840   KETONESUR NEGATIVE 01/18/2019 1840   PROTEINUR NEGATIVE 01/18/2019 1840   NITRITE NEGATIVE 01/18/2019 1840   LEUKOCYTESUR NEGATIVE 01/18/2019 1840   Sepsis Labs: @LABRCNTIP (procalcitonin:4,lacticidven:4)  ) Recent Results (from the past 240 hour(s))  Blood Culture (routine x 2)     Status: None (Preliminary result)   Collection Time: 06/27/19  6:24 PM   Specimen: BLOOD  Result Value Ref Range Status   Specimen Description   Final    BLOOD RIGHT HAND Performed at [facility], [address] [place], [place] [address]    Special Requests   Final    BOTTLES DRAWN AEROBIC AND ANAEROBIC Blood Culture adequate volume Performed at [facility], [address] [place], [place] [address]    Culture   Final    NO GROWTH < 24 HOURS Performed at [facility], [address] [place], [place] [address]    Report Status PENDING  Incomplete  SARS CORONAVIRUS 2 (TAT 6-24 HRS) Nasopharyngeal Nasopharyngeal Swab     Status: Abnormal   Collection Time: 06/27/19  9:03 PM   Specimen: Nasopharyngeal Swab  Result Value Ref Range Status   SARS Coronavirus 2 POSITIVE (A) NEGATIVE Final    Comment: RESULT CALLED TO, READ BACK BY AND VERIFIED WITH: L. Amaryllis DykeWILLIAMS,AC [id number] 06/28/2019 T. TYSOR PATIENT IS ALREADY AT [facility] (NOTE) SARS-CoV-2 target nucleic acids are DETECTED. The SARS-CoV-2 RNA is generally detectable in upper and lower respiratory specimens during the acute phase of infection. Positive results are indicative of active infection with SARS-CoV-2. Clinical  correlation with patient history and other diagnostic information is necessary to determine patient infection status. Positive results do  not rule out bacterial infection or co-infection with other viruses. The expected result is Negative. Fact Sheet for Patients: HairSlick.nohttps://www.fda.gov/media/138098/download Fact Sheet for Healthcare Providers: quierodirigir.comhttps://www.fda.gov/media/138095/download This test is not yet approved or cleared by the [place] FDA and  has been authorized for detection and/or diagnosis of SARS-CoV-2 by FDA under an Emergency Use Authorization (EUA). This EUA will remain  in effect (meani ng this test can be used) for the duration of the COVID-19 declaration under Section 564(b)(1) of the Act, 21 U.S.C. section 360bbb-3(b)(1), unless the authorization is terminated or revoked sooner. Performed at [facility], [address] [place], [place] [address]          Radiology  Studies: Dg Chest Port 1 View  Result Date: 06/27/2019 CLINICAL DATA:  COVID-19 positive. Shortness of breath with chest pain EXAM: PORTABLE CHEST 1 VIEW COMPARISON:  Jan 18, 2019 FINDINGS: There is ill-defined opacity in the medial right base. The lungs elsewhere are clear. Heart is upper normal in size with pulmonary vascularity normal. No adenopathy. No bone lesions. IMPRESSION: Ill-defined airspace opacity consistent with pneumonia medial right base. No consolidation. Lungs elsewhere clear. Heart upper normal in size. No evident adenopathy. Electronically Signed   By: Bretta BangWilliam  Woodruff III M.D.   On: 06/27/2019 19:54        Scheduled Meds: . amLODipine  10 mg Oral Daily  . atorvastatin  20 mg Oral Daily  . carvedilol  25 mg Oral BID  .  dexamethasone  6 mg Oral Daily  . [START ON 06/29/2019] enoxaparin (LOVENOX) injection  80 mg Subcutaneous Q24H  . insulin aspart  0-20 Units Subcutaneous Q4H  . Ipratropium-Albuterol  1 puff Inhalation QID  . lisinopril  10 mg Oral Daily   Continuous Infusions: . sodium chloride Stopped (06/28/19 0230)  . sodium chloride 125 mL/hr at 06/28/19 1334  . remdesivir 200 mg in NS 250 mL     Followed by  . [START ON 06/29/2019] remdesivir 100 mg in NS 250 mL       LOS: 1 day   The patient is critically ill with multiple organ systems failure and requires high complexity decision making for assessment and support, frequent evaluation and titration of therapies, application of advanced monitoring technologies and extensive interpretation of multiple databases. Critical Care Time devoted to patient care services described in this note  Time spent: 40 minutes     WOODS, Roselind Messier, MD [facility] Pager [phone]  If 7PM-7AM, please contact night-coverage [email or web address] Password [email or web address] 06/28/2019, 2:18 PM

## 2019-06-28 NOTE — Progress Notes (Signed)
ANTICOAGULATION CONSULT NOTE - Initial Consult  Pharmacy Consult for Lovenox Indication: VTE prophylaxis  No Known Allergies  Patient Measurements: Height: 5\' 9"  (175.3 cm) Weight: (!) 368 lb 2.7 oz (167 kg) IBW/kg (Calculated) : 70.7  Vital Signs: Temp: 98.7 F (37.1 C) (10/28 0726) Temp Source: Oral (10/28 0726) BP: 157/101 (10/28 1130) Pulse Rate: 101 (10/28 1130)  Labs: Recent Labs    06/27/19 1824  HGB 13.9  HCT 42.8  PLT 252  CREATININE 1.13    Estimated Creatinine Clearance: 140.9 mL/min (by C-G formula based on SCr of 1.13 mg/dL).   Medical History: Past Medical History:  Diagnosis Date  . Atypical chest pain 05/27/2016   a. 05/2016: NST showing small area of moderare anteroapical ischemia b. 05/2016: cath showing tortous but normal cors which confirmed a false positive NST.  . Diabetes mellitus without complication ([place])   . GERD (gastroesophageal reflux disease) 10/02/2016  . Hypertension     Medications:  Medications Prior to Admission  Medication Sig Dispense Refill Last Dose  . amLODipine (NORVASC) 10 MG tablet Take 1 tablet (10 mg total) by mouth daily. 90 tablet 1 Past Week at Unknown time  . atorvastatin (LIPITOR) 20 MG tablet Take 20 mg by mouth daily.   06/25/2019  . carvedilol (COREG) 25 MG tablet Take 1 tablet (25 mg total) by mouth 2 (two) times daily. 180 tablet 1 Past Week at Unknown time  . glipiZIDE (GLUCOTROL) 10 MG tablet Take 10 mg by mouth 2 (two) times daily.   06/25/2019  . liraglutide (VICTOZA) 18 MG/3ML SOPN Inject subcutaneously daily with breakfast 0.6 mg daily for 1 week then 1.2 mg daily for 1 week then 1.8 mg daily thereafter 30 mL 6 06/25/2019  . lisinopril (ZESTRIL) 10 MG tablet Take 1 tablet (10 mg total) by mouth daily. 90 tablet 1 06/25/2019  . Blood Glucose Monitoring Suppl (TRUE METRIX METER) DEVI 1 each by Does not apply route 3 (three) times daily before meals. 1 Device 0   . glipiZIDE (GLUCOTROL) 5 MG tablet Take 1  tablet (5 mg total) by mouth 2 (two) times daily before a meal. (Patient not taking: Reported on 06/27/2019) 180 tablet 1 Not Taking at Unknown time  . glucose blood (TRUE METRIX BLOOD GLUCOSE TEST) test strip 3 times daily before meals 100 each 12   . Insulin Pen Needle (TRUEPLUS PEN NEEDLES) 32G X 4 MM MISC Use as instructed with Victoza. 100 each 11   . metFORMIN (GLUCOPHAGE) 500 MG tablet Take 2 tablets (1,000 mg total) by mouth 2 (two) times daily with a meal. 360 tablet 1 06/25/2019  . TRUEPLUS LANCETS 28G MISC 1 each by Does not apply route 3 (three) times daily before meals. 100 each 12     Assessment: 71 YOM with COVID-19 pneumonia to start Lovenox for VTE prophylaxis. SCr wnl. H/H down slightly. Plt wnl. D-dimer 3.88   Goal of Therapy:  VTE prevention Monitor platelets by anticoagulation protocol: Yes   Plan:  -Start Lovenox 0.5 mg/kg (80 mg) once daily per COVID-protocol  -Monitor CBC, renal fx, and s/s of bleeding   Albertina Parr, PharmD., BCPS Clinical Pharmacist Clinical phone for 06/28/19 until 5pm: [phone]

## 2019-06-28 NOTE — Progress Notes (Signed)
Pt is morbidly obese at 167kg (BMI 54). We will adjust his lovenox to 0.5mg /kg/day.   Scr 1.13, cbc wnl.  Lovenox 80mg  SQ qday.  Onnie Boer, PharmD, BCIDP, AAHIVP, CPP Infectious Disease Pharmacist 06/28/2019 1:13 PM

## 2019-06-29 ENCOUNTER — Ambulatory Visit: Payer: 59 | Admitting: Registered"

## 2019-06-29 LAB — COMPREHENSIVE METABOLIC PANEL
ALT: 24 U/L (ref 0–44)
AST: 17 U/L (ref 15–41)
Albumin: 3.3 g/dL — ABNORMAL LOW (ref 3.5–5.0)
Alkaline Phosphatase: 58 U/L (ref 38–126)
Anion gap: 7 (ref 5–15)
BUN: 14 mg/dL (ref 6–20)
CO2: 19 mmol/L — ABNORMAL LOW (ref 22–32)
Calcium: 8.8 mg/dL — ABNORMAL LOW (ref 8.9–10.3)
Chloride: 109 mmol/L (ref 98–111)
Creatinine, Ser: 0.95 mg/dL (ref 0.61–1.24)
GFR calc Af Amer: >60 mL/min (ref >60)
GFR calc non Af Amer: >60 mL/min (ref >60)
Glucose, Bld: 153 mg/dL — ABNORMAL HIGH (ref 70–99)
Potassium: 4 mmol/L (ref 3.5–5.1)
Sodium: 135 mmol/L (ref 135–145)
Total Bilirubin: 0.5 mg/dL (ref 0.3–1.2)
Total Protein: 7.7 g/dL (ref 6.5–8.1)

## 2019-06-29 LAB — CBC WITH DIFFERENTIAL/PLATELET
Abs Immature Granulocytes: 0.07 10*3/uL (ref 0.00–0.07)
Basophils Absolute: 0 10*3/uL (ref 0.0–0.1)
Basophils Relative: 0 %
Eosinophils Absolute: 0 10*3/uL (ref 0.0–0.5)
Eosinophils Relative: 0 %
HCT: 35.7 % — ABNORMAL LOW (ref 39.0–52.0)
Hemoglobin: 11.5 g/dL — ABNORMAL LOW (ref 13.0–17.0)
Immature Granulocytes: 1 %
Lymphocytes Relative: 13 %
Lymphs Abs: 1.2 10*3/uL (ref 0.7–4.0)
MCH: 26.9 pg (ref 26.0–34.0)
MCHC: 32.2 g/dL (ref 30.0–36.0)
MCV: 83.6 fL (ref 80.0–100.0)
Monocytes Absolute: 0.6 10*3/uL (ref 0.1–1.0)
Monocytes Relative: 7 %
Neutro Abs: 7.7 10*3/uL (ref 1.7–7.7)
Neutrophils Relative %: 79 %
Platelets: 273 10*3/uL (ref 150–400)
RBC: 4.27 MIL/uL (ref 4.22–5.81)
RDW: 14.3 % (ref 11.5–15.5)
WBC: 9.7 10*3/uL (ref 4.0–10.5)
nRBC: 0 % (ref 0.0–0.2)

## 2019-06-29 LAB — PROCALCITONIN: Procalcitonin: <0.1 ng/mL

## 2019-06-29 LAB — GLUCOSE, CAPILLARY
Glucose-Capillary: 130 mg/dL — ABNORMAL HIGH (ref 70–99)
Glucose-Capillary: 135 mg/dL — ABNORMAL HIGH (ref 70–99)
Glucose-Capillary: 161 mg/dL — ABNORMAL HIGH (ref 70–99)
Glucose-Capillary: 220 mg/dL — ABNORMAL HIGH (ref 70–99)
Glucose-Capillary: 246 mg/dL — ABNORMAL HIGH (ref 70–99)
Glucose-Capillary: 276 mg/dL — ABNORMAL HIGH (ref 70–99)

## 2019-06-29 LAB — LIPID PANEL
Cholesterol: 129 mg/dL (ref 0–200)
HDL: 37 mg/dL — ABNORMAL LOW (ref >40)
LDL Cholesterol: 75 mg/dL (ref 0–99)
Total CHOL/HDL Ratio: 3.5 RATIO
Triglycerides: 84 mg/dL (ref <150)
VLDL: 17 mg/dL (ref 0–40)

## 2019-06-29 LAB — C-REACTIVE PROTEIN: CRP: 19.2 mg/dL — ABNORMAL HIGH (ref <1.0)

## 2019-06-29 LAB — LACTATE DEHYDROGENASE: LDH: 190 U/L (ref 98–192)

## 2019-06-29 LAB — FERRITIN: Ferritin: 133 ng/mL (ref 24–336)

## 2019-06-29 LAB — HEMOGLOBIN A1C
Hgb A1c MFr Bld: 6.5 % — ABNORMAL HIGH (ref 4.8–5.6)
Mean Plasma Glucose: 139.85 mg/dL

## 2019-06-29 LAB — PHOSPHORUS: Phosphorus: 1.9 mg/dL — ABNORMAL LOW (ref 2.5–4.6)

## 2019-06-29 LAB — D-DIMER, QUANTITATIVE: D-Dimer, Quant: 3.85 ug/mL-FEU — ABNORMAL HIGH (ref 0.00–0.50)

## 2019-06-29 LAB — MAGNESIUM: Magnesium: 1.8 mg/dL (ref 1.7–2.4)

## 2019-06-29 MED ORDER — LISINOPRIL 10 MG PO TABS
15.0000 mg | ORAL_TABLET | Freq: Every day | ORAL | Status: DC
  Administered 2019-06-30 – 2019-07-01 (×2): 15 mg via ORAL
  Filled 2019-06-29 (×2): qty 2

## 2019-06-29 MED ORDER — ATORVASTATIN CALCIUM 40 MG PO TABS
40.0000 mg | ORAL_TABLET | Freq: Every day | ORAL | Status: DC
  Administered 2019-06-30 – 2019-07-02 (×3): 40 mg via ORAL
  Filled 2019-06-29 (×3): qty 1

## 2019-06-29 NOTE — Progress Notes (Signed)
PROGRESS NOTE    William Moss  STM:[id number] DOB: [birth year] DOA: 06/27/2019 PCP: Hoy Register, MD   Brief Narrative:  32 y.o. BM PMHx diabetes type 2 uncontrolled with complication, HTN, nonobstructive CAD, morbid obesity, HLD, DX COVID-19,   presents to the ER with progressive shortness of breath and right-sided chest pain in the last 2 days.  Patient was diagnosed apparently was COVID-19 in the outpatient setting last Saturday.  He was doing okay initially at home until the last 2 days.  He has had low-grade fever and myalgias.  He denied any significant cough.  Denied any dizziness.  No nausea and vomiting.  Patient was seen in the ER and evaluated.  He is not hypoxic but is very tachycardic.  His chest x-ray showed no mild infiltrates.  Patient has been admitted for aggressive hydration and management.  Due to his comorbidities and now being very still symptomatic.Marland Kitchen  ED Course: Temperature 100.5, blood pressure 150/103, pulse 123 respiratory of 35 and oxygen sat is 95% on room air.  White count is 11.4 hemoglobin 13.9 and platelets 252.  Sodium 134 potassium 4.9 and CO2 18.  Glucose 116.  D-dimer 3.84, fibrinogen 652.  Repeat COVID-19 testing is pending.  Chest x-ray showed ill-defined airspace opacity consistent with pneumonia in medial right base.  Patient will be admitted for treatment.   Subjective: 10/29 4, negative CP positive S OB but improving   Assessment & Plan:   Principal Problem:   Sinus tachycardia Active Problems:   Essential hypertension   Morbid obesity (HCC)   Non-insulin dependent type 2 diabetes mellitus (HCC)   Coronary artery disease involving native heart   GERD (gastroesophageal reflux disease)   Sleep apnea   COVID-19 virus infection   Pneumonia due to COVID-19 virus   Diabetes mellitus type 2, uncontrolled, with complications (HCC)   Acute on chronic respiratory failure with hypoxia (HCC)    Covid pneumonia/acute respiratory failure with  hypoxia  -Decadron 6 mg daily -Remdesivir per pharmacy protocol -Patient counseled may require Actemra if he does not respond to Decadron and remdesivir.  Counseled that there is not a cure for Covid however these medications have shown antidotal benefit.  Patient has agreed to allow [place] to treat him with these agents.  OSA -See Covid pneumonia -Titrate O2 to maintain SPO2> 88%  Essential HTN -Amlodipine 10 mg daily -Coreg 25 mg BID -10/29 increase lisinopril 15 mg daily   Sinus tachycardia -Patient had not been on HTN medication, should resolve monitor closely. -See above  Morbid obesity -2000 cal/day heart healthy/carb modified diet -Nutrition consult; uncontrolled diabetes, counseling on 2000 -calorie/day diet and control of diabetes -Diabetic coordinator; uncontrolled diabetic  Diabetes type 2 uncontrolled with complication -06/09/2018 hemoglobin A1c= 9.0 -9/14 = 6.0  -Lipid panel pending -Resistant SSI  HLD -10/29 increase Lipitor 40 mg daily   DVT prophylaxis: Lovenox Code Status: Full Family Communication:  Disposition Plan: TBD   Consultants:  None  Procedures/Significant Events:  None   I have personally reviewed and interpreted all radiology studies and my findings are as above.  VENTILATOR SETTINGS:    Cultures 10/27 SARS coronavirus positive 10/27 blood pending    Antimicrobials: Anti-infectives (From admission, onward)   Start     Stop   06/29/19 1000  remdesivir 100 mg in sodium chloride 0.9 % 250 mL IVPB     07/03/19 0959   06/28/19 1400  remdesivir 200 mg in sodium chloride 0.9 % 250 mL IVPB  Devices    LINES / TUBES:      Continuous Infusions: . sodium chloride Stopped (06/28/19 0230)  . sodium chloride 125 mL/hr at 06/29/19 0748  . remdesivir 100 mg in NS 250 mL       Objective: Vitals:   06/28/19 2016 06/29/19 0000 06/29/19 0306 06/29/19 0729  BP: (!) 154/98 128/84 131/83 133/87  Pulse:  85 78 77   Resp: 18 (!) 29  (!) 21  Temp: 98.1 F (36.7 C) 98 F (36.7 C) 97.6 F (36.4 C) 98 F (36.7 C)  TempSrc: Oral Oral Oral Oral  SpO2: 92%  91% 95%  Weight:      Height:        Intake/Output Summary (Last 24 hours) at 06/29/2019 0841 Last data filed at 06/29/2019 5035 Gross per 24 hour  Intake 3465.85 ml  Output 1900 ml  Net 1565.85 ml   Filed Weights   06/28/19 0438  Weight: (!) 167 kg   Physical Exam:  General: A/O x4, positive acute respiratory distress Eyes: negative scleral hemorrhage, negative anisocoria, negative icterus ENT: Negative Runny nose, negative gingival bleeding, Neck:  Negative scars, masses, torticollis, lymphadenopathy, JVD Lungs: Tachypneic clear to auscultation bilaterally without wheezes or crackles Cardiovascular: Tachycardic without murmur gallop or rub normal S1 and S2 Abdomen: MORBIDLY OBESE negative abdominal pain, nondistended, positive soft, bowel sounds, no rebound, no ascites,o appreciable mass Extremities: No significant cyanosis, clubbing, or edema bilateral lower extremities Skin: Negative rashes, lesions, ulcers Psychiatric:  Negative depression, negative anxiety, negative fatigue, negative mania  Central nervous system:  Cranial nerves II through XII intact, tongue/uvula midline, all extremities muscle strength 5/5, sensation intact throughout, negative dysarthria, negative expressive aphasia, negative receptive aphasia. .     Data Reviewed: Care during the described time interval was provided by me .  I have reviewed this patient's available data, including medical history, events of note, physical examination, and all test results as part of my evaluation.   CBC: Recent Labs  Lab 06/27/19 1824 06/28/19 1330 06/29/19 0005  WBC 11.4* 9.6 9.7  NEUTROABS 7.5 6.2 7.7  HGB 13.9 11.7* 11.5*  HCT 42.8 35.6* 35.7*  MCV 84.4 83.2 83.6  PLT 252 252 465   Basic Metabolic Panel: Recent Labs  Lab 06/27/19 1824 06/28/19 1330 06/29/19  0005  NA 134* 137 135  K 4.9 3.6 4.0  CL 102 107 109  CO2 18* 20* 19*  GLUCOSE 116* 163* 153*  BUN 12 11 14   CREATININE 1.13 0.96 0.95  CALCIUM 9.0 8.8* 8.8*  MG  --  1.6* 1.8  PHOS  --  2.3* 1.9*   GFR: Estimated Creatinine Clearance: 167.6 mL/min (by C-G formula based on SCr of 0.95 mg/dL). Liver Function Tests: Recent Labs  Lab 06/27/19 1824 06/28/19 1330 06/29/19 0005  AST 29 15 17   ALT 29 20 24   ALKPHOS 65 57 58  BILITOT 1.5* 1.0 0.5  PROT 8.9* 7.6 7.7  ALBUMIN 3.9 3.3* 3.3*   No results for input(s): LIPASE, AMYLASE in the last 168 hours. No results for input(s): AMMONIA in the last 168 hours. Coagulation Profile: No results for input(s): INR, PROTIME in the last 168 hours. Cardiac Enzymes: No results for input(s): CKTOTAL, CKMB, CKMBINDEX, TROPONINI in the last 168 hours. BNP (last 3 results) No results for input(s): PROBNP in the last 8760 hours. HbA1C: No results for input(s): HGBA1C in the last 72 hours. CBG: Recent Labs  Lab 06/28/19 1608 06/28/19 2029 06/28/19 2358 06/29/19 6812 06/29/19 7517  GLUCAP 160* 262* 161* 130* 135*   Lipid Profile: Recent Labs    06/27/19 1824 06/29/19 0005  CHOL  --  129  HDL  --  37*  LDLCALC  --  75  TRIG 109 84  CHOLHDL  --  3.5   Thyroid Function Tests: No results for input(s): TSH, T4TOTAL, FREET4, T3FREE, THYROIDAB in the last 72 hours. Anemia Panel: Recent Labs    06/28/19 1330 06/29/19 0005  FERRITIN 119 133   Urine analysis:    Component Value Date/Time   COLORURINE STRAW (A) 01/18/2019 1840   APPEARANCEUR CLEAR 01/18/2019 1840   LABSPEC 1.021 01/18/2019 1840   PHURINE 5.0 01/18/2019 1840   GLUCOSEU >=500 (A) 01/18/2019 1840   HGBUR NEGATIVE 01/18/2019 1840   BILIRUBINUR NEGATIVE 01/18/2019 1840   KETONESUR NEGATIVE 01/18/2019 1840   PROTEINUR NEGATIVE 01/18/2019 1840   NITRITE NEGATIVE 01/18/2019 1840   LEUKOCYTESUR NEGATIVE 01/18/2019 1840   Sepsis Labs:  (procalcitonin:4,lacticidven:4)  ) Recent Results (from the past 240 hour(s))  Blood Culture (routine x 2)     Status: None (Preliminary result)   Collection Time: 06/27/19  6:24 PM   Specimen: BLOOD  Result Value Ref Range Status   Specimen Description   Final    BLOOD RIGHT HAND Performed at [facility], [address], [place], [place] [address]    Special Requests   Final    BOTTLES DRAWN AEROBIC AND ANAEROBIC Blood Culture adequate volume Performed at [facility], [address], [place], [place] [address]    Culture   Final    NO GROWTH 2 DAYS Performed at [facility], [address], [place], [place] [address]    Report Status PENDING  Incomplete  SARS CORONAVIRUS 2 (TAT 6-24 HRS) Nasopharyngeal Nasopharyngeal Swab     Status: Abnormal   Collection Time: 06/27/19  9:03 PM   Specimen: Nasopharyngeal Swab  Result Value Ref Range Status   SARS Coronavirus 2 POSITIVE (A) NEGATIVE Final    Comment: RESULT CALLED TO, READ BACK BY AND VERIFIED WITH: L. Amaryllis Dyke 2130 06/28/2019 T. TYSOR PATIENT IS ALREADY AT [facility] (NOTE) SARS-CoV-2 target nucleic acids are DETECTED. The SARS-CoV-2 RNA is generally detectable in upper and lower respiratory specimens during the acute phase of infection. Positive results are indicative of active infection with SARS-CoV-2. Clinical  correlation with patient history and other diagnostic information is necessary to determine patient infection status. Positive results do  not rule out bacterial infection or co-infection with other viruses. The expected result is Negative. Fact Sheet for Patients: HairSlick.no Fact Sheet for Healthcare Providers: quierodirigir.com This test is not yet approved or cleared by the [place] FDA and  has been authorized for detection and/or diagnosis of SARS-CoV-2 by FDA under an Emergency Use  Authorization (EUA). This EUA will remain  in effect (meani ng this test can be used) for the duration of the COVID-19 declaration under Section 564(b)(1) of the Act, 21 U.S.C. section 360bbb-3(b)(1), unless the authorization is terminated or revoked sooner. Performed at [facility], [address], [place], [place] [address]   Culture, blood (routine x 2)     Status: None (Preliminary result)   Collection Time: 06/28/19  1:30 PM   Specimen: BLOOD  Result Value Ref Range Status   Specimen Description   Final    BLOOD LEFT ARM Performed at [facility], [address] [place], [place] [address]    Special Requests  Final    BOTTLES DRAWN AEROBIC AND ANAEROBIC Blood Culture adequate volume Performed at [facility], [address] [place], [place] [address]    Culture   Final    NO GROWTH < 24 HOURS Performed at [facility], [address] [place], [place] [address]    Report Status PENDING  Incomplete         Radiology Studies: Dg Chest Port 1 View  Result Date: 06/27/2019 CLINICAL DATA:  COVID-19 positive. Shortness of breath with chest pain EXAM: PORTABLE CHEST 1 VIEW COMPARISON:  Jan 18, 2019 FINDINGS: There is ill-defined opacity in the medial right base. The lungs elsewhere are clear. Heart is upper normal in size with pulmonary vascularity normal. No adenopathy. No bone lesions. IMPRESSION: Ill-defined airspace opacity consistent with pneumonia medial right base. No consolidation. Lungs elsewhere clear. Heart upper normal in size. No evident adenopathy. Electronically Signed   By: Bretta BangWilliam  Woodruff III M.D.   On: 06/27/2019 19:54        Scheduled Meds: . amLODipine  10 mg Oral Daily  . atorvastatin  20 mg Oral Daily  . carvedilol  25 mg Oral BID  . dexamethasone  6 mg Oral Daily  . enoxaparin (LOVENOX) injection  80 mg Subcutaneous Q24H  . insulin aspart  0-20 Units Subcutaneous Q4H  . Ipratropium-Albuterol   1 puff Inhalation QID  . lisinopril  10 mg Oral Daily   Continuous Infusions: . sodium chloride Stopped (06/28/19 0230)  . sodium chloride 125 mL/hr at 06/29/19 0748  . remdesivir 100 mg in NS 250 mL       LOS: 2 days   The patient is critically ill with multiple organ systems failure and requires high complexity decision making for assessment and support, frequent evaluation and titration of therapies, application of advanced monitoring technologies and extensive interpretation of multiple databases. Critical Care Time devoted to patient care services described in this note  Time spent: 40 minutes     Vennesa Bastedo, Roselind MessierURTIS J, MD [facility] Pager [phone]  If 7PM-7AM, please contact night-coverage [email or web address] Password [email or web address] 06/29/2019, 8:41 AM

## 2019-06-29 NOTE — Progress Notes (Signed)
Inpatient Diabetes Program Recommendations  AACE/ADA: New Consensus Statement on Inpatient Glycemic Control   Target Ranges:  Prepandial:   less than 140 mg/dL      Peak postprandial:   less than 180 mg/dL (1-2 hours)      Critically ill patients:  140 - 180 mg/dL   Results for William Moss, William Moss (MRN [id number]) as of 06/29/2019 09:41  Ref. Range 06/28/2019 07:22 06/28/2019 11:30 06/28/2019 16:08 06/28/2019 20:29 06/28/2019 23:58 06/29/2019 05:29 06/29/2019 07:27  Glucose-Capillary Latest Ref Range: 70 - 99 mg/dL 143 (H) 153 (H)  Novolog 2 units 160 (H)  Novolog 4 units 262 (H)  Novolog 11 units 161 (H)  Novolog 4 units 130 (H)  Novolog 3 units 135 (H)  Novolog 3 units    Review of Glycemic Control  Diabetes history: DM2 Outpatient Diabetes medications: Glipizide 10 mg BID, Metformin 1000 mg BID, Victoza 1.8 mg daily Current orders for Inpatient glycemic control: Novolog 0-20 units Q4H; Decadron 6 mg daily  Inpatient Diabetes Program Recommendations:   Insulin-Meal Coverage: While ordered steroids, please consider ordering Novolog 4 units TID with meals for meal coverage if patient eats at least 50% of meals.  Insulin-Basal: If steroids are continued and glucose becomes consistently greater than 180 mg/dl, please consider ordering Levemir 10 units Q24H.  Thanks, Barnie Alderman, RN, MSN, CDE Diabetes Coordinator Inpatient Diabetes Program [phone] (Team Pager from 8am to 5pm)

## 2019-06-29 NOTE — TOC Initial Note (Signed)
Transition of Care [facility]) - Initial/Assessment Note    Patient Details  Name: William Moss MRN: [id number] Date of Birth: February 02, 1986  Transition of Care [facility]) CM/SW Contact:    Ninfa Meeker, RN Phone Number: 06/29/2019, 1:28 PM  Clinical Narrative:  Patient is a 33 y.o. male with medical history significant of recently diagnosed COVID-19, diabetes, hypertension, nonobstructive coronary artery disease, morbid obesity, hyperlipidemia who presented  to the ER with progressive shortness of breath and right-sided chest pain in the last 2 days.  Patient was diagnosed COVID-19 positive in the outpatient setting last Saturday. Receiving Remdesivir, decadron. Case manager will continue to monitor for needs as patient medically improves, may he be blessed to do so.         Patient Goals and CMS Choice        Expected Discharge Plan and Services                                                Prior Living Arrangements/Services                       Activities of Daily Living Home Assistive Devices/Equipment: None ADL Screening (condition at time of admission) Patient's cognitive ability adequate to safely complete daily activities?: Yes Is the patient deaf or have difficulty hearing?: No Does the patient have difficulty seeing, even when wearing glasses/contacts?: No Does the patient have difficulty concentrating, remembering, or making decisions?: No Patient able to express need for assistance with ADLs?: Yes Does the patient have difficulty dressing or bathing?: No Independently performs ADLs?: Yes (appropriate for developmental age) Does the patient have difficulty walking or climbing stairs?: No Weakness of Legs: None Weakness of Arms/Hands: None  Permission Sought/Granted                  Emotional Assessment              Admission diagnosis:  Pneumonia due to COVID-19 virus [U07.1, J12.89] Patient Active Problem List   Diagnosis Date  Noted  . Pneumonia due to COVID-19 virus 06/28/2019  . Diabetes mellitus type 2, uncontrolled, with complications ([place]) 91/47/8295  . Acute on chronic respiratory failure with hypoxia ([place]) 06/28/2019  . Sinus tachycardia 06/27/2019  . COVID-19 virus infection 06/27/2019  . Sleep apnea 03/27/2019  . Normal coronary arteries 03/27/2019  . GERD (gastroesophageal reflux disease) 10/02/2016  . Coronary artery disease involving native heart   . Abnormal nuclear stress test   . Atypical chest pain 05/27/2016  . Non-insulin dependent type 2 diabetes mellitus ([place]) 09/30/2015  . Hematochezia 09/30/2015  . Essential hypertension 07/29/2015  . Morbid obesity ([place]) 07/29/2015   PCP:  Charlott Rakes, MD Pharmacy:   [id number] - [place], [place] [address] [place] [address] Phone: [phone] Fax: [phone]     Social Determinants of Health (SDOH) Interventions    Readmission Risk Interventions No flowsheet data found.

## 2019-06-29 NOTE — Progress Notes (Signed)
William Moss updated 

## 2019-06-29 NOTE — Progress Notes (Signed)
Nutrition Consult (diet education)  Received consult for DM and weight loss diet education.  Diet education not appropriate during acute hospitalization for COVID-19. Will attach DM education materials to patient's D/C instructions. Patient would benefit from outpatient weight loss counseling, recommend referral to the Nutrition and Diabetes Education Services center after D/C home and recovered. Patient is consuming 100% of meals. Patient with 3% weight loss within the past 1.5 months, not significant for the time frame. No further nutrition intervention at this time. Please re-consult as needed.   Molli Barrows, RD, LDN, [place] Pager [phone] After Hours Pager [phone]

## 2019-06-30 DIAGNOSIS — K2100 Gastro-esophageal reflux disease with esophagitis, without bleeding: Secondary | ICD-10-CM

## 2019-06-30 DIAGNOSIS — E119 Type 2 diabetes mellitus without complications: Secondary | ICD-10-CM

## 2019-06-30 LAB — COMPREHENSIVE METABOLIC PANEL
ALT: 25 U/L (ref 0–44)
AST: 13 U/L — ABNORMAL LOW (ref 15–41)
Albumin: 3.3 g/dL — ABNORMAL LOW (ref 3.5–5.0)
Alkaline Phosphatase: 54 U/L (ref 38–126)
Anion gap: 8 (ref 5–15)
BUN: 18 mg/dL (ref 6–20)
CO2: 20 mmol/L — ABNORMAL LOW (ref 22–32)
Calcium: 9.1 mg/dL (ref 8.9–10.3)
Chloride: 113 mmol/L — ABNORMAL HIGH (ref 98–111)
Creatinine, Ser: 0.89 mg/dL (ref 0.61–1.24)
GFR calc Af Amer: >60 mL/min (ref >60)
GFR calc non Af Amer: >60 mL/min (ref >60)
Glucose, Bld: 183 mg/dL — ABNORMAL HIGH (ref 70–99)
Potassium: 4.2 mmol/L (ref 3.5–5.1)
Sodium: 141 mmol/L (ref 135–145)
Total Bilirubin: 0.3 mg/dL (ref 0.3–1.2)
Total Protein: 7.7 g/dL (ref 6.5–8.1)

## 2019-06-30 LAB — CBC WITH DIFFERENTIAL/PLATELET
Abs Immature Granulocytes: 0.08 10*3/uL — ABNORMAL HIGH (ref 0.00–0.07)
Basophils Absolute: 0 10*3/uL (ref 0.0–0.1)
Basophils Relative: 0 %
Eosinophils Absolute: 0 10*3/uL (ref 0.0–0.5)
Eosinophils Relative: 0 %
HCT: 34.3 % — ABNORMAL LOW (ref 39.0–52.0)
Hemoglobin: 11.2 g/dL — ABNORMAL LOW (ref 13.0–17.0)
Immature Granulocytes: 1 %
Lymphocytes Relative: 14 %
Lymphs Abs: 1.5 10*3/uL (ref 0.7–4.0)
MCH: 27.3 pg (ref 26.0–34.0)
MCHC: 32.7 g/dL (ref 30.0–36.0)
MCV: 83.7 fL (ref 80.0–100.0)
Monocytes Absolute: 0.9 10*3/uL (ref 0.1–1.0)
Monocytes Relative: 8 %
Neutro Abs: 8.4 10*3/uL — ABNORMAL HIGH (ref 1.7–7.7)
Neutrophils Relative %: 77 %
Platelets: 274 10*3/uL (ref 150–400)
RBC: 4.1 MIL/uL — ABNORMAL LOW (ref 4.22–5.81)
RDW: 14.5 % (ref 11.5–15.5)
WBC: 11 10*3/uL — ABNORMAL HIGH (ref 4.0–10.5)
nRBC: 0 % (ref 0.0–0.2)

## 2019-06-30 LAB — LACTATE DEHYDROGENASE: LDH: 168 U/L (ref 98–192)

## 2019-06-30 LAB — PHOSPHORUS: Phosphorus: 1.9 mg/dL — ABNORMAL LOW (ref 2.5–4.6)

## 2019-06-30 LAB — D-DIMER, QUANTITATIVE: D-Dimer, Quant: 3.77 ug/mL-FEU — ABNORMAL HIGH (ref 0.00–0.50)

## 2019-06-30 LAB — GLUCOSE, CAPILLARY
Glucose-Capillary: 139 mg/dL — ABNORMAL HIGH (ref 70–99)
Glucose-Capillary: 153 mg/dL — ABNORMAL HIGH (ref 70–99)
Glucose-Capillary: 161 mg/dL — ABNORMAL HIGH (ref 70–99)
Glucose-Capillary: 196 mg/dL — ABNORMAL HIGH (ref 70–99)
Glucose-Capillary: 251 mg/dL — ABNORMAL HIGH (ref 70–99)
Glucose-Capillary: 262 mg/dL — ABNORMAL HIGH (ref 70–99)
Glucose-Capillary: 328 mg/dL — ABNORMAL HIGH (ref 70–99)

## 2019-06-30 LAB — PROCALCITONIN: Procalcitonin: <0.1 ng/mL

## 2019-06-30 LAB — C-REACTIVE PROTEIN: CRP: 10 mg/dL — ABNORMAL HIGH (ref <1.0)

## 2019-06-30 LAB — MAGNESIUM: Magnesium: 1.7 mg/dL (ref 1.7–2.4)

## 2019-06-30 LAB — FERRITIN: Ferritin: 123 ng/mL (ref 24–336)

## 2019-06-30 MED ORDER — CLONIDINE HCL 0.1 MG PO TABS
0.1000 mg | ORAL_TABLET | Freq: Two times a day (BID) | ORAL | Status: DC
  Administered 2019-06-30: 0.1 mg via ORAL
  Filled 2019-06-30 (×2): qty 1

## 2019-06-30 MED ORDER — INSULIN GLARGINE 100 UNIT/ML ~~LOC~~ SOLN
5.0000 [IU] | Freq: Every day | SUBCUTANEOUS | Status: DC
  Administered 2019-06-30 – 2019-07-01 (×2): 5 [IU] via SUBCUTANEOUS
  Filled 2019-06-30 (×3): qty 0.05

## 2019-06-30 NOTE — Progress Notes (Signed)
Danny updated 

## 2019-06-30 NOTE — Progress Notes (Signed)
Inpatient Diabetes Program Recommendations  AACE/ADA: New Consensus Statement on Inpatient Glycemic Control   Target Ranges:  Prepandial:   less than 140 mg/dL      Peak postprandial:   less than 180 mg/dL (1-2 hours)      Critically ill patients:  140 - 180 mg/dL   Results for William Moss, William Moss (MRN [id number]) as of 06/30/2019 07:54  Ref. Range 06/29/2019 07:27 06/29/2019 11:48 06/29/2019 16:15 06/29/2019 19:50 06/30/2019 01:06 06/30/2019 04:57  Glucose-Capillary Latest Ref Range: 70 - 99 mg/dL 135 (H)  Novolog 3 units 220 (H)  Novolog 7 units 276 (H)  Novolog 11 units 246 (H)  Novolog 7 units 153 (H)  Novolog 4 units 161 (H)  Novolog 4 units  Results for William Moss, William Moss (MRN [id number]) as of 06/30/2019 07:54  Ref. Range 06/29/2019 00:52  Hemoglobin A1C Latest Ref Range: 4.8 - 5.6 % 6.5 (H)   Review of Glycemic Control  Diabetes history:DM2 Outpatient Diabetes medications:Glipizide 10 mg BID, Metformin 1000 mg BID, Victoza 1.8 mg daily Current orders for Inpatient glycemic control:Novolog 0-20 units Q4H; Decadron 6 mg daily  Inpatient Diabetes Program Recommendations:  Insulin-Meal Coverage: While ordered steroids, please consider ordering Novolog4units TID with meals for meal coverage if patient eats at least 50% of meals.  Insulin-Basal: Over the past 24 hours glucose has ranged from 139-276 mg/dl and has received a total of 39 units of Novolog for correction.  If steroids are continued, please consider ordering Levemir 10 units Q24H.  Thanks, Barnie Alderman, RN, MSN, CDE Diabetes Coordinator Inpatient Diabetes Program [phone] (Team Pager from 8am to 5pm)

## 2019-06-30 NOTE — Progress Notes (Signed)
Late Entry for 06/29/19 @ 2226  Nursing rounds completed. Staff nurse makes inquiry related to calling designated family to give update on plan of care. Patient states that he will update family

## 2019-06-30 NOTE — Progress Notes (Signed)
PROGRESS NOTE    William Moss  ZOX:[id number] DOB: 05-May-1986 DOA: 06/27/2019 PCP: Hoy RegisterNewlin, Enobong, MD   Brief Narrative:  33 y.o. BM PMHx diabetes type 2 uncontrolled with complication, HTN, nonobstructive CAD, morbid obesity, HLD, DX COVID-19,   presents to the ER with progressive shortness of breath and right-sided chest pain in the last 2 days.  Patient was diagnosed apparently was COVID-19 in the outpatient setting last Saturday.  He was doing okay initially at home until the last 2 days.  He has had low-grade fever and myalgias.  He denied any significant cough.  Denied any dizziness.  No nausea and vomiting.  Patient was seen in the ER and evaluated.  He is not hypoxic but is very tachycardic.  His chest x-ray showed no mild infiltrates.  Patient has been admitted for aggressive hydration and management.  Due to his comorbidities and now being very still symptomatic.Marland Kitchen.  ED Course: Temperature 100.5, blood pressure 150/103, pulse 123 respiratory of 35 and oxygen sat is 95% on room air.  White count is 11.4 hemoglobin 13.9 and platelets 252.  Sodium 134 potassium 4.9 and CO2 18.  Glucose 116.  D-dimer 3.84, fibrinogen 652.  Repeat COVID-19 testing is pending.  Chest x-ray showed ill-defined airspace opacity consistent with pneumonia in medial right base.  Patient will be admitted for treatment.   Subjective: 10/30 A/O x4, negative CP, negative S OB, negative abdominal pain   Assessment & Plan:   Principal Problem:   Sinus tachycardia Active Problems:   Essential hypertension   Morbid obesity (HCC)   Non-insulin dependent type 2 diabetes mellitus (HCC)   Coronary artery disease involving native heart   GERD (gastroesophageal reflux disease)   Sleep apnea   COVID-19 virus infection   Pneumonia due to COVID-19 virus   Diabetes mellitus type 2, uncontrolled, with complications (HCC)   Acute on chronic respiratory failure with hypoxia (HCC)    Covid pneumonia/acute respiratory  failure with hypoxia  -Decadron 6 mg daily -Remdesivir per pharmacy protocol -Patient counseled may require Actemra if he does not respond to Decadron and remdesivir.  Counseled that there is not a cure for Covid however these medications have shown antidotal benefit.  Patient has agreed to allow us to treat him with these agents. -Patient significantly improved at completion of his treatment regimen should be able to be discharged home.  OSA -See Covid pneumonia -Titrate O2 to maintain SPO2> 88%  Essential HTN -Amlodipine 10 mg daily -Coreg 25 mg BID -10/29 increase lisinopril 15 mg daily  -10/30 clonidine 0.1 mg BID  Sinus tachycardia -Patient had not been on HTN medication, should resolve monitor closely. -See above -Resolved  Morbid obesity -2000 cal/day heart healthy/carb modified diet -Nutrition consult; uncontrolled diabetes, counseling on 2000 -calorie/day diet and control of diabetes -Diabetic coordinator; uncontrolled diabetic  Diabetes type 2 uncontrolled with complication -06/09/2018 hemoglobin A1c= 9.0 -9/14 = 6.0  -10/30 Lantus 5 units daily -Resistant SSI  HLD -10/29 LDL= 75 -10/29 increase Lipitor 40 mg daily   DVT prophylaxis: Lovenox Code Status: Full Family Communication:  Disposition Plan: TBD   Consultants:  None  Procedures/Significant Events:  None   I have personally reviewed and interpreted all radiology studies and my findings are as above.  VENTILATOR SETTINGS:    Cultures 10/27 SARS coronavirus positive 10/27 blood pending    Antimicrobials: Anti-infectives (From admission, onward)   Start     Stop   06/29/19 1000  remdesivir 100 mg in sodium chloride 0.9 %  250 mL IVPB     07/03/19 0959   06/28/19 1400  remdesivir 200 mg in sodium chloride 0.9 % 250 mL IVPB            Devices    LINES / TUBES:      Continuous Infusions: . sodium chloride Stopped (06/28/19 0230)  . sodium chloride 125 mL/hr at 06/30/19 0912   . remdesivir 100 mg in NS 250 mL 100 mg (06/30/19 0914)     Objective: Vitals:   06/30/19 0756 06/30/19 0916 06/30/19 1144 06/30/19 1615  BP: (!) 145/91 (!) 159/97 (!) 157/97 (!) 149/101  Pulse: 67  79 87  Resp: 19  (!) 21 (!) 23  Temp: 97.8 F (36.6 C)  98.1 F (36.7 C) 98.7 F (37.1 C)  TempSrc: Oral  Oral Oral  SpO2: 96%  94% 97%  Weight:      Height:        Intake/Output Summary (Last 24 hours) at 06/30/2019 1636 Last data filed at 06/30/2019 1500 Gross per 24 hour  Intake 3208.95 ml  Output 1175 ml  Net 2033.95 ml   Filed Weights   06/28/19 0438  Weight: (!) 167 kg   Physical Exam:  General: A/O x4, no acute respiratory distress Eyes: negative scleral hemorrhage, negative anisocoria, negative icterus ENT: Negative Runny nose, negative gingival bleeding, Neck:  Negative scars, masses, torticollis, lymphadenopathy, JVD Lungs: Clear to auscultation bilaterally without wheezes or crackles Cardiovascular: Regular rate and rhythm without murmur gallop or rub normal S1 and S2 Abdomen: negative abdominal pain, nondistended, positive soft, bowel sounds, no rebound, no ascites, no appreciable mass Extremities: No significant cyanosis, clubbing, or edema bilateral lower extremities Skin: Negative rashes, lesions, ulcers Psychiatric:  Negative depression, negative anxiety, negative fatigue, negative mania  Central nervous system:  Cranial nerves II through XII intact, tongue/uvula midline, all extremities muscle strength 5/5, sensation intact throughout, negative dysarthria, negative expressive aphasia, negative receptive aphasia.      Data Reviewed: Care during the described time interval was provided by me .  I have reviewed this patient's available data, including medical history, events of note, physical examination, and all test results as part of my evaluation.   CBC: Recent Labs  Lab 06/27/19 1824 06/28/19 1330 06/29/19 0005 06/30/19 0000  WBC 11.4* 9.6 9.7  11.0*  NEUTROABS 7.5 6.2 7.7 8.4*  HGB 13.9 11.7* 11.5* 11.2*  HCT 42.8 35.6* 35.7* 34.3*  MCV 84.4 83.2 83.6 83.7  PLT 252 252 273 956   Basic Metabolic Panel: Recent Labs  Lab 06/27/19 1824 06/28/19 1330 06/29/19 0005 06/30/19 0000  NA 134* 137 135 141  K 4.9 3.6 4.0 4.2  CL 102 107 109 113*  CO2 18* 20* 19* 20*  GLUCOSE 116* 163* 153* 183*  BUN 12 11 14 18   CREATININE 1.13 0.96 0.95 0.89  CALCIUM 9.0 8.8* 8.8* 9.1  MG  --  1.6* 1.8 1.7  PHOS  --  2.3* 1.9* 1.9*   GFR: Estimated Creatinine Clearance: 178.9 mL/min (by C-G formula based on SCr of 0.89 mg/dL). Liver Function Tests: Recent Labs  Lab 06/27/19 1824 06/28/19 1330 06/29/19 0005 06/30/19 0000  AST 29 15 17  13*  ALT 29 20 24 25   ALKPHOS 65 57 58 54  BILITOT 1.5* 1.0 0.5 0.3  PROT 8.9* 7.6 7.7 7.7  ALBUMIN 3.9 3.3* 3.3* 3.3*   No results for input(s): LIPASE, AMYLASE in the last 168 hours. No results for input(s): AMMONIA in the last 168 hours. Coagulation  Profile: No results for input(s): INR, PROTIME in the last 168 hours. Cardiac Enzymes: No results for input(s): CKTOTAL, CKMB, CKMBINDEX, TROPONINI in the last 168 hours. BNP (last 3 results) No results for input(s): PROBNP in the last 8760 hours. HbA1C: Recent Labs    06/29/19 0052  HGBA1C 6.5*   CBG: Recent Labs  Lab 06/30/19 0106 06/30/19 0457 06/30/19 0753 06/30/19 1143 06/30/19 1532  GLUCAP 153* 161* 139* 196* 262*   Lipid Profile: Recent Labs    06/27/19 1824 06/29/19 0005  CHOL  --  129  HDL  --  37*  LDLCALC  --  75  TRIG 109 84  CHOLHDL  --  3.5   Thyroid Function Tests: No results for input(s): TSH, T4TOTAL, FREET4, T3FREE, THYROIDAB in the last 72 hours. Anemia Panel: Recent Labs    06/29/19 0005 06/30/19 0000  FERRITIN 133 123   Urine analysis:    Component Value Date/Time   COLORURINE STRAW (A) 01/18/2019 1840   APPEARANCEUR CLEAR 01/18/2019 1840   LABSPEC 1.021 01/18/2019 1840   PHURINE 5.0 01/18/2019  1840   GLUCOSEU >=500 (A) 01/18/2019 1840   HGBUR NEGATIVE 01/18/2019 1840   BILIRUBINUR NEGATIVE 01/18/2019 1840   KETONESUR NEGATIVE 01/18/2019 1840   PROTEINUR NEGATIVE 01/18/2019 1840   NITRITE NEGATIVE 01/18/2019 1840   LEUKOCYTESUR NEGATIVE 01/18/2019 1840   Sepsis Labs: (procalcitonin:4,lacticidven:4)  ) Recent Results (from the past 240 hour(s))  Blood Culture (routine x 2)     Status: None (Preliminary result)   Collection Time: 06/27/19  6:24 PM   Specimen: BLOOD  Result Value Ref Range Status   Specimen Description   Final    BLOOD RIGHT HAND Performed at [facility], [address], [place], [place] [address]    Special Requests   Final    BOTTLES DRAWN AEROBIC AND ANAEROBIC Blood Culture adequate volume Performed at [facility], [address] [place], [place] [address]    Culture   Final    NO GROWTH 3 DAYS Performed at [facility], [address], [place], [place] [address]    Report Status PENDING  Incomplete  SARS CORONAVIRUS 2 (TAT 6-24 HRS) Nasopharyngeal Nasopharyngeal Swab     Status: Abnormal   Collection Time: 06/27/19  9:03 PM   Specimen: Nasopharyngeal Swab  Result Value Ref Range Status   SARS Coronavirus 2 POSITIVE (A) NEGATIVE Final    Comment: RESULT CALLED TO, READ BACK BY AND VERIFIED WITH: L. Amaryllis Dyke [id number] 06/28/2019 T. TYSOR PATIENT IS ALREADY AT [facility] (NOTE) SARS-CoV-2 target nucleic acids are DETECTED. The SARS-CoV-2 RNA is generally detectable in upper and lower respiratory specimens during the acute phase of infection. Positive results are indicative of active infection with SARS-CoV-2. Clinical  correlation with patient history and other diagnostic information is necessary to determine patient infection status. Positive results do  not rule out bacterial infection or co-infection with other viruses. The expected result is Negative. Fact Sheet for Patients:  HairSlick.no Fact Sheet for Healthcare Providers: quierodirigir.com This test is not yet approved or cleared by the [place] FDA and  has been authorized for detection and/or diagnosis of SARS-CoV-2 by FDA under an Emergency Use Authorization (EUA). This EUA will remain  in effect (meani ng this test can be used) for the duration of the COVID-19 declaration under Section 564(b)(1) of the Act, 21 U.S.C. section 360bbb-3(b)(1), unless the authorization is terminated or revoked sooner. Performed at [facility], [address] [place], [place] [address]   Culture, blood (routine x 2)     Status: None (Preliminary result)   Collection Time: 06/28/19  1:30 PM   Specimen: BLOOD  Result Value Ref Range Status   Specimen Description   Final    BLOOD LEFT ARM Performed at [id number], [address] [place], [place] [address]    Special Requests   Final    BOTTLES DRAWN AEROBIC AND ANAEROBIC Blood Culture adequate volume Performed at [facility], [address], [place], [place] [address]    Culture   Final    NO GROWTH 2 DAYS Performed at [facility], [address] [place], [place] [address]    Report Status PENDING  Incomplete         Radiology Studies: No results found.      Scheduled Meds: . amLODipine  10 mg Oral Daily  . atorvastatin  40 mg Oral Daily  . carvedilol  25 mg Oral BID  . dexamethasone  6 mg Oral Daily  . enoxaparin (LOVENOX) injection  80 mg Subcutaneous Q24H  . insulin aspart  0-20 Units Subcutaneous Q4H  . Ipratropium-Albuterol  1 puff Inhalation QID  . lisinopril  15 mg Oral Daily   Continuous Infusions: . sodium chloride Stopped (06/28/19 0230)  . sodium chloride 125 mL/hr at 06/30/19 0912  . remdesivir 100 mg in NS 250 mL 100 mg (06/30/19 0914)     LOS: 3 days   The patient is critically ill with multiple organ systems failure  and requires high complexity decision making for assessment and support, frequent evaluation and titration of therapies, application of advanced monitoring technologies and extensive interpretation of multiple databases. Critical Care Time devoted to patient care services described in this note  Time spent: 40 minutes     , Roselind Messier, MD [facility] Pager [phone]  If 7PM-7AM, please contact night-coverage [email or web address] Password [email or web address] 06/30/2019, 4:36 PM

## 2019-07-01 LAB — GLUCOSE, CAPILLARY
Glucose-Capillary: 192 mg/dL — ABNORMAL HIGH (ref 70–99)
Glucose-Capillary: 115 mg/dL — ABNORMAL HIGH (ref 70–99)
Glucose-Capillary: 198 mg/dL — ABNORMAL HIGH (ref 70–99)
Glucose-Capillary: 318 mg/dL — ABNORMAL HIGH (ref 70–99)

## 2019-07-01 LAB — D-DIMER, QUANTITATIVE: D-Dimer, Quant: 2.93 ug/mL-FEU — ABNORMAL HIGH (ref 0.00–0.50)

## 2019-07-01 LAB — COMPREHENSIVE METABOLIC PANEL
ALT: 23 U/L (ref 0–44)
AST: 12 U/L — ABNORMAL LOW (ref 15–41)
Albumin: 2.9 g/dL — ABNORMAL LOW (ref 3.5–5.0)
Alkaline Phosphatase: 51 U/L (ref 38–126)
Anion gap: 8 (ref 5–15)
BUN: 22 mg/dL — ABNORMAL HIGH (ref 6–20)
CO2: 19 mmol/L — ABNORMAL LOW (ref 22–32)
Calcium: 8.7 mg/dL — ABNORMAL LOW (ref 8.9–10.3)
Chloride: 110 mmol/L (ref 98–111)
Creatinine, Ser: 0.88 mg/dL (ref 0.61–1.24)
GFR calc Af Amer: >60 mL/min (ref >60)
GFR calc non Af Amer: >60 mL/min (ref >60)
Glucose, Bld: 242 mg/dL — ABNORMAL HIGH (ref 70–99)
Potassium: 4.1 mmol/L (ref 3.5–5.1)
Sodium: 137 mmol/L (ref 135–145)
Total Bilirubin: 0.4 mg/dL (ref 0.3–1.2)
Total Protein: 6.9 g/dL (ref 6.5–8.1)

## 2019-07-01 LAB — CBC WITH DIFFERENTIAL/PLATELET
Abs Immature Granulocytes: 0.13 10*3/uL — ABNORMAL HIGH (ref 0.00–0.07)
Basophils Absolute: 0 10*3/uL (ref 0.0–0.1)
Basophils Relative: 0 %
Eosinophils Absolute: 0 10*3/uL (ref 0.0–0.5)
Eosinophils Relative: 0 %
HCT: 33.1 % — ABNORMAL LOW (ref 39.0–52.0)
Hemoglobin: 10.7 g/dL — ABNORMAL LOW (ref 13.0–17.0)
Immature Granulocytes: 1 %
Lymphocytes Relative: 16 %
Lymphs Abs: 1.6 10*3/uL (ref 0.7–4.0)
MCH: 27 pg (ref 26.0–34.0)
MCHC: 32.3 g/dL (ref 30.0–36.0)
MCV: 83.6 fL (ref 80.0–100.0)
Monocytes Absolute: 0.7 10*3/uL (ref 0.1–1.0)
Monocytes Relative: 7 %
Neutro Abs: 7.2 10*3/uL (ref 1.7–7.7)
Neutrophils Relative %: 76 %
Platelets: 265 10*3/uL (ref 150–400)
RBC: 3.96 MIL/uL — ABNORMAL LOW (ref 4.22–5.81)
RDW: 14.4 % (ref 11.5–15.5)
WBC: 9.6 10*3/uL (ref 4.0–10.5)
nRBC: 0 % (ref 0.0–0.2)

## 2019-07-01 LAB — C-REACTIVE PROTEIN: CRP: 4.9 mg/dL — ABNORMAL HIGH (ref <1.0)

## 2019-07-01 LAB — PHOSPHORUS: Phosphorus: 2.7 mg/dL (ref 2.5–4.6)

## 2019-07-01 LAB — MAGNESIUM: Magnesium: 1.8 mg/dL (ref 1.7–2.4)

## 2019-07-01 LAB — LACTATE DEHYDROGENASE: LDH: 166 U/L (ref 98–192)

## 2019-07-01 LAB — FERRITIN: Ferritin: 99 ng/mL (ref 24–336)

## 2019-07-01 MED ORDER — CLONIDINE HCL 0.1 MG PO TABS
0.2000 mg | ORAL_TABLET | Freq: Two times a day (BID) | ORAL | Status: DC
  Administered 2019-07-01 (×2): 0.2 mg via ORAL
  Filled 2019-07-01 (×2): qty 2

## 2019-07-01 MED ORDER — LISINOPRIL 20 MG PO TABS: 20.0000 mg | ORAL_TABLET | Freq: Every day | ORAL | Status: DC

## 2019-07-01 MED ORDER — LISINOPRIL 10 MG PO TABS
5.0000 mg | ORAL_TABLET | Freq: Once | ORAL | Status: AC
  Administered 2019-07-01: 5 mg via ORAL
  Filled 2019-07-01: qty 1

## 2019-07-01 NOTE — Plan of Care (Signed)
No acute changes. .  Problem: Education: Goal: Knowledge of General Education information will improve Description: Including pain rating scale, medication(s)/side effects and non-pharmacologic comfort measures Outcome: Progressing   Problem: Health Behavior/Discharge Planning: Goal: Ability to manage health-related needs will improve Outcome: Progressing   Problem: Clinical Measurements: Goal: Ability to maintain clinical measurements within normal limits will improve Outcome: Progressing Goal: Will remain free from infection Outcome: Progressing Goal: Diagnostic test results will improve Outcome: Progressing Goal: Respiratory complications will improve Outcome: Progressing Goal: Cardiovascular complication will be avoided Outcome: Progressing   Problem: Activity: Goal: Risk for activity intolerance will decrease Outcome: Progressing   Problem: Nutrition: Goal: Adequate nutrition will be maintained Outcome: Progressing   Problem: Coping: Goal: Level of anxiety will decrease Outcome: Progressing   Problem: Elimination: Goal: Will not experience complications related to bowel motility Outcome: Progressing Goal: Will not experience complications related to urinary retention Outcome: Progressing   Problem: Pain Managment: Goal: General experience of comfort will improve Outcome: Progressing   Problem: Safety: Goal: Ability to remain free from injury will improve Outcome: Progressing   Problem: Skin Integrity: Goal: Risk for impaired skin integrity will decrease Outcome: Progressing

## 2019-07-01 NOTE — Progress Notes (Addendum)
PROGRESS NOTE    William Moss  RKY:[id number] DOB: 1985-11-19 DOA: 06/27/2019 PCP: Charlott Rakes, MD   Brief Narrative:  33 y.o. BM PMHx diabetes type 2 uncontrolled with complication, HTN, nonobstructive CAD, morbid obesity, HLD, DX COVID-19,   presents to the ER with progressive shortness of breath and right-sided chest pain in the last 2 days.  Patient was diagnosed apparently was COVID-19 in the outpatient setting last Saturday.  He was doing okay initially at home until the last 2 days.  He has had low-grade fever and myalgias.  He denied any significant cough.  Denied any dizziness.  No nausea and vomiting.  Patient was seen in the ER and evaluated.  He is not hypoxic but is very tachycardic.  His chest x-ray showed no mild infiltrates.  Patient has been admitted for aggressive hydration and management.  Due to his comorbidities and now being very still symptomatic.Marland Kitchen  ED Course: Temperature 100.5, blood pressure 150/103, pulse 123 respiratory of 35 and oxygen sat is 95% on room air.  White count is 11.4 hemoglobin 13.9 and platelets 252.  Sodium 134 potassium 4.9 and CO2 18.  Glucose 116.  D-dimer 3.84, fibrinogen 652.  Repeat COVID-19 testing is pending.  Chest x-ray showed ill-defined airspace opacity consistent with pneumonia in medial right base.  Patient will be admitted for treatment.   Subjective: 10/31 A/O x4, negative CP, negative S OB, negative abdominal pain.  Assessment & Plan:   Principal Problem:   Sinus tachycardia Active Problems:   Essential hypertension   Morbid obesity (HCC)   Non-insulin dependent type 2 diabetes mellitus ([place])   Coronary artery disease involving native heart   GERD (gastroesophageal reflux disease)   Sleep apnea   COVID-19 virus infection   Pneumonia due to COVID-19 virus   Diabetes mellitus type 2, uncontrolled, with complications (HCC)   Acute on chronic respiratory failure with hypoxia (HCC)    Covid pneumonia/acute respiratory  failure with hypoxia  -Decadron 6 mg daily -Remdesivir per pharmacy protocol -Patient counseled may require Actemra if he does not respond to Decadron and remdesivir.  Counseled that there is not a cure for Covid however these medications have shown antidotal benefit.  Patient has agreed to allow [place] to treat him with these agents. -Patient significantly improved at completion of his treatment regimen should be able to be discharged home.  OSA -See Covid pneumonia -Titrate O2 to maintain SPO2> 88% -On discharge will need to arrange with PCP for sleep study  Essential HTN -Amlodipine 10 mg daily -Coreg 25 mg BID -10/31 increase lisinopril 20 mg daily -10/31 increase clonidine 0.2 mg BID  Sinus tachycardia -Patient had not been on HTN medication, should resolve monitor closely. -See above -Resolved  Morbid obesity -2000 cal/day heart healthy/carb modified diet -Nutrition consult; uncontrolled diabetes, counseling on 2000 -calorie/day diet and control of diabetes -Diabetic coordinator; uncontrolled diabetic  Diabetes type 2 uncontrolled with complication -31/51/7616 hemoglobin A1c= 9.0 -9/14 = 6.0  -10/30 Lantus 5 units daily -Resistant SSI  HLD -10/29 LDL= 75 -10/29 increase Lipitor 40 mg daily   DVT prophylaxis: Lovenox Code Status: Full Family Communication: 10/31 left message on Renata Caprice (friend) phone that I had called to update him on Mr. Leicht plan of care. Disposition Plan: TBD   Consultants:  None  Procedures/Significant Events:  None   I have personally reviewed and interpreted all radiology studies and my findings are as above.  VENTILATOR SETTINGS:    Cultures 10/27 SARS coronavirus positive 10/27 blood  pending    Antimicrobials: Anti-infectives (From admission, onward)   Start     Stop   06/29/19 1000  remdesivir 100 mg in sodium chloride 0.9 % 250 mL IVPB     07/03/19 0959   06/28/19 1400  remdesivir 200 mg in sodium chloride 0.9 % 250  mL IVPB            Devices    LINES / TUBES:      Continuous Infusions: . sodium chloride Stopped (06/28/19 0230)  . sodium chloride 125 mL/hr at 07/01/19 0258  . remdesivir 100 mg in NS 250 mL Stopped (06/30/19 0944)     Objective: Vitals:   06/30/19 1806 06/30/19 1934 07/01/19 0344 07/01/19 0729  BP: (!) 157/101 (!) 160/86 (!) 154/98 (!) 157/99  Pulse:  75 65 67  Resp:  (!) 26 (!) 24 (!) 21  Temp:  98.6 F (37 C) 98.2 F (36.8 C) 97.7 F (36.5 C)  TempSrc:  Oral Oral Oral  SpO2:  91% 98% 99%  Weight:      Height:        Intake/Output Summary (Last 24 hours) at 07/01/2019 0844 Last data filed at 06/30/2019 2300 Gross per 24 hour  Intake 2526.71 ml  Output 1225 ml  Net 1301.71 ml   Filed Weights   06/28/19 0438  Weight: (!) 167 kg   Physical Exam:  General: A/O x4, no acute respiratory distress Eyes: negative scleral hemorrhage, negative anisocoria, negative icterus ENT: Negative Runny nose, negative gingival bleeding, Neck:  Negative scars, masses, torticollis, lymphadenopathy, JVD Lungs: Clear to auscultation bilaterally without wheezes or crackles Cardiovascular: Regular rate and rhythm without murmur gallop or rub normal S1 and S2 Abdomen: MORBIDLY OBESE negative abdominal pain, nondistended, positive soft, bowel sounds, no rebound, no ascites, no appreciable mass Extremities: No significant cyanosis, clubbing, or edema bilateral lower extremities Skin: Negative rashes, lesions, ulcers Psychiatric:  Negative depression, negative anxiety, negative fatigue, negative mania  Central nervous system:  Cranial nerves II through XII intact, tongue/uvula midline, all extremities muscle strength 5/5, sensation intact throughout, negative dysarthria, negative expressive aphasia, negative receptive aphasia.      Data Reviewed: Care during the described time interval was provided by me .  I have reviewed this patient's available data, including medical history,  events of note, physical examination, and all test results as part of my evaluation.   CBC: Recent Labs  Lab 06/27/19 1824 06/28/19 1330 06/29/19 0005 06/30/19 0000 07/01/19 0018  WBC 11.4* 9.6 9.7 11.0* 9.6  NEUTROABS 7.5 6.2 7.7 8.4* 7.2  HGB 13.9 11.7* 11.5* 11.2* 10.7*  HCT 42.8 35.6* 35.7* 34.3* 33.1*  MCV 84.4 83.2 83.6 83.7 83.6  PLT 252 252 273 274 265   Basic Metabolic Panel: Recent Labs  Lab 06/27/19 1824 06/28/19 1330 06/29/19 0005 06/30/19 0000 07/01/19 0018  NA 134* 137 135 141 137  K 4.9 3.6 4.0 4.2 4.1  CL 102 107 109 113* 110  CO2 18* 20* 19* 20* 19*  GLUCOSE 116* 163* 153* 183* 242*  BUN 12 11 14 18  22*  CREATININE 1.13 0.96 0.95 0.89 0.88  CALCIUM 9.0 8.8* 8.8* 9.1 8.7*  MG  --  1.6* 1.8 1.7 1.8  PHOS  --  2.3* 1.9* 1.9* 2.7   GFR: Estimated Creatinine Clearance: 181 mL/min (by C-G formula based on SCr of 0.88 mg/dL). Liver Function Tests: Recent Labs  Lab 06/27/19 1824 06/28/19 1330 06/29/19 0005 06/30/19 0000 07/01/19 0018  AST 29 15 17  13*  12*  ALT ALKPHOS 65 57 58 54 51  BILITOT 1.5* 1.0 0.5 0.3 0.4  PROT 8.9* 7.6 7.7 7.7 6.9  ALBUMIN 3.9 3.3* 3.3* 3.3* 2.9*   No results for input(s): LIPASE, AMYLASE in the last 168 hours. No results for input(s): AMMONIA in the last 168 hours. Coagulation Profile: No results for input(s): INR, PROTIME in the last 168 hours. Cardiac Enzymes: No results for input(s): CKTOTAL, CKMB, CKMBINDEX, TROPONINI in the last 168 hours. BNP (last 3 results) No results for input(s): PROBNP in the last 8760 hours. HbA1C: Recent Labs    06/29/19 0052  HGBA1C 6.5*   CBG: Recent Labs  Lab 06/30/19 1532 06/30/19 1938 06/30/19 2341 07/01/19 0343 07/01/19 0830  GLUCAP 262* 328* 251* 192* 115*   Lipid Profile: Recent Labs    06/29/19 0005  CHOL 129  HDL 37*  LDLCALC 75  TRIG 84  CHOLHDL 3.5   Thyroid Function Tests: No results for input(s): TSH, T4TOTAL, FREET4, T3FREE, THYROIDAB  in the last 72 hours. Anemia Panel: Recent Labs    06/30/19 0000 07/01/19 0018  FERRITIN 123 99   Urine analysis:    Component Value Date/Time   COLORURINE STRAW (A) 01/18/2019 1840   APPEARANCEUR CLEAR 01/18/2019 1840   LABSPEC 1.021 01/18/2019 1840   PHURINE 5.0 01/18/2019 1840   GLUCOSEU >=500 (A) 01/18/2019 1840   HGBUR NEGATIVE 01/18/2019 1840   BILIRUBINUR NEGATIVE 01/18/2019 1840   KETONESUR NEGATIVE 01/18/2019 1840   PROTEINUR NEGATIVE 01/18/2019 1840   NITRITE NEGATIVE 01/18/2019 1840   LEUKOCYTESUR NEGATIVE 01/18/2019 1840   Sepsis Labs: (procalcitonin:4,lacticidven:4)  ) Recent Results (from the past 240 hour(s))  Blood Culture (routine x 2)     Status: None (Preliminary result)   Collection Time: 06/27/19  6:24 PM   Specimen: BLOOD  Result Value Ref Range Status   Specimen Description   Final    BLOOD RIGHT HAND Performed at [facility], [address], [place], [place] [address]    Special Requests   Final    BOTTLES DRAWN AEROBIC AND ANAEROBIC Blood Culture adequate volume Performed at [facility], [address] [place], [place] [address]    Culture   Final    NO GROWTH 3 DAYS Performed at [facility], [address], [place], [place] [address]    Report Status PENDING  Incomplete  SARS CORONAVIRUS 2 (TAT 6-24 HRS) Nasopharyngeal Nasopharyngeal Swab     Status: Abnormal   Collection Time: 06/27/19  9:03 PM   Specimen: Nasopharyngeal Swab  Result Value Ref Range Status   SARS Coronavirus 2 POSITIVE (A) NEGATIVE Final    Comment: RESULT CALLED TO, READ BACK BY AND VERIFIED WITH: L. Amaryllis Dyke [id number] 06/28/2019 T. TYSOR PATIENT IS ALREADY AT [facility] (NOTE) SARS-CoV-2 target nucleic acids are DETECTED. The SARS-CoV-2 RNA is generally detectable in upper and lower respiratory specimens during the acute phase of infection. Positive results are indicative of active infection with SARS-CoV-2. Clinical   correlation with patient history and other diagnostic information is necessary to determine patient infection status. Positive results do  not rule out bacterial infection or co-infection with other viruses. The expected result is Negative. Fact Sheet for Patients: HairSlick.no Fact Sheet for Healthcare Providers: quierodirigir.com This test is not yet approved or cleared by the [place] FDA and  has been authorized for detection and/or diagnosis of SARS-CoV-2 by FDA under an Emergency Use Authorization (EUA). This EUA will  remain  in effect (meani ng this test can be used) for the duration of the COVID-19 declaration under Section 564(b)(1) of the Act, 21 U.S.C. section 360bbb-3(b)(1), unless the authorization is terminated or revoked sooner. Performed at [facility], [address] [place], [place] [address]   Culture, blood (routine x 2)     Status: None (Preliminary result)   Collection Time: 06/28/19  1:30 PM   Specimen: BLOOD  Result Value Ref Range Status   Specimen Description   Final    BLOOD LEFT ARM Performed at [facility], [address] [place], [place] [address]    Special Requests   Final    BOTTLES DRAWN AEROBIC AND ANAEROBIC Blood Culture adequate volume Performed at [facility], [address] [place], [place] [address]    Culture   Final    NO GROWTH 2 DAYS Performed at [facility], [address] [place], [place] [address]    Report Status PENDING  Incomplete         Radiology Studies: No results found.      Scheduled Meds: . amLODipine  10 mg Oral Daily  . atorvastatin  40 mg Oral Daily  . carvedilol  25 mg Oral BID  . cloNIDine  0.1 mg Oral BID  . dexamethasone  6 mg Oral Daily  . enoxaparin (LOVENOX) injection  80 mg Subcutaneous Q24H  . insulin aspart  0-20 Units Subcutaneous Q4H  . insulin glargine  5 Units  Subcutaneous Daily  . Ipratropium-Albuterol  1 puff Inhalation QID  . lisinopril  15 mg Oral Daily   Continuous Infusions: . sodium chloride Stopped (06/28/19 0230)  . sodium chloride 125 mL/hr at 07/01/19 0258  . remdesivir 100 mg in NS 250 mL Stopped (06/30/19 0944)     LOS: 4 days   The patient is critically ill with multiple organ systems failure and requires high complexity decision making for assessment and support, frequent evaluation and titration of therapies, application of advanced monitoring technologies and extensive interpretation of multiple databases. Critical Care Time devoted to patient care services described in this note  Time spent: 40 minutes     Kathlyn Leachman, Roselind Messier, MD [facility] Pager [phone]  If 7PM-7AM, please contact night-coverage [email or web address] Password [email or web address] 07/01/2019, 8:44 AM

## 2019-07-01 NOTE — Progress Notes (Addendum)
Inpatient Diabetes Program Recommendations  AACE/ADA: New Consensus Statement on Inpatient Glycemic Control  Target Ranges:  Prepandial:   less than 140 mg/dL      Peak postprandial:   less than 180 mg/dL (1-2 hours)      Critically ill patients:  140 - 180 mg/dL   Results for William Moss, William Moss (MRN [id number]) as of 07/01/2019 08:58  Ref. Range 06/30/2019 07:53 06/30/2019 11:43 06/30/2019 15:32 06/30/2019 18:06 06/30/2019 19:38 06/30/2019 23:41 07/01/2019 03:43 07/01/2019 08:30  Glucose-Capillary Latest Ref Range: 70 - 99 mg/dL 139 (H)  Novolog 7 units 196 (H)  Novolog 4 units 262 (H)  Novolog 11 units    Lantus 5 units 328 (H)  Novolog 15 units 251 (H)  Novolog 11 units 192 (H)  Novolog 4 units 115 (H)   Review of Glycemic Control  Diabetes history:DM2 Outpatient Diabetes medications:Glipizide 10 mg BID, Metformin 1000 mg BID, Victoza 1.8 mg daily Current orders for Inpatient glycemic control:Lantus 5 units daily, Novolog 0-20 units Q4H; Decadron 6 mg daily  Inpatient Diabetes Program Recommendations:  Insulin-Meal Coverage: Post prandial glucose consistently elevated. While ordered steroids, please consider ordering Novolog5units TID with meals for meal coverage if patient eats at least 50% of meals.  Insulin-Basal:If steroids are continued,  please consider increasing Lantus to 10 units daily.  Thanks, Barnie Alderman, RN, MSN, CDE Diabetes Coordinator Inpatient Diabetes Program [phone] (Team Pager from 8am to 5pm)

## 2019-07-01 NOTE — Progress Notes (Signed)
   07/01/19 1500  Family/Significant Other Communication  Family/Significant Other Update Called;Updated Denman George, Danny)

## 2019-07-02 DIAGNOSIS — E7800 Pure hypercholesterolemia, unspecified: Secondary | ICD-10-CM

## 2019-07-02 DIAGNOSIS — Z6841 Body Mass Index (BMI) 40.0 and over, adult: Secondary | ICD-10-CM

## 2019-07-02 DIAGNOSIS — E088 Diabetes mellitus due to underlying condition with unspecified complications: Secondary | ICD-10-CM

## 2019-07-02 LAB — PHOSPHORUS: Phosphorus: 3.2 mg/dL (ref 2.5–4.6)

## 2019-07-02 LAB — MAGNESIUM: Magnesium: 1.8 mg/dL (ref 1.7–2.4)

## 2019-07-02 LAB — COMPREHENSIVE METABOLIC PANEL
ALT: 25 U/L (ref 0–44)
AST: 12 U/L — ABNORMAL LOW (ref 15–41)
Albumin: 2.7 g/dL — ABNORMAL LOW (ref 3.5–5.0)
Alkaline Phosphatase: 46 U/L (ref 38–126)
Anion gap: 8 (ref 5–15)
BUN: 22 mg/dL — ABNORMAL HIGH (ref 6–20)
CO2: 20 mmol/L — ABNORMAL LOW (ref 22–32)
Calcium: 8.7 mg/dL — ABNORMAL LOW (ref 8.9–10.3)
Chloride: 109 mmol/L (ref 98–111)
Creatinine, Ser: 0.9 mg/dL (ref 0.61–1.24)
GFR calc Af Amer: >60 mL/min (ref >60)
GFR calc non Af Amer: >60 mL/min (ref >60)
Glucose, Bld: 271 mg/dL — ABNORMAL HIGH (ref 70–99)
Potassium: 4.3 mmol/L (ref 3.5–5.1)
Sodium: 137 mmol/L (ref 135–145)
Total Bilirubin: 0.2 mg/dL — ABNORMAL LOW (ref 0.3–1.2)
Total Protein: 6.3 g/dL — ABNORMAL LOW (ref 6.5–8.1)

## 2019-07-02 LAB — CBC WITH DIFFERENTIAL/PLATELET
Abs Immature Granulocytes: 0.2 10*3/uL — ABNORMAL HIGH (ref 0.00–0.07)
Basophils Absolute: 0 10*3/uL (ref 0.0–0.1)
Basophils Relative: 0 %
Eosinophils Absolute: 0 10*3/uL (ref 0.0–0.5)
Eosinophils Relative: 0 %
HCT: 33.7 % — ABNORMAL LOW (ref 39.0–52.0)
Hemoglobin: 10.7 g/dL — ABNORMAL LOW (ref 13.0–17.0)
Immature Granulocytes: 2 %
Lymphocytes Relative: 19 %
Lymphs Abs: 1.8 10*3/uL (ref 0.7–4.0)
MCH: 27.2 pg (ref 26.0–34.0)
MCHC: 31.8 g/dL (ref 30.0–36.0)
MCV: 85.5 fL (ref 80.0–100.0)
Monocytes Absolute: 0.8 10*3/uL (ref 0.1–1.0)
Monocytes Relative: 9 %
Neutro Abs: 6.5 10*3/uL (ref 1.7–7.7)
Neutrophils Relative %: 70 %
Platelets: 292 10*3/uL (ref 150–400)
RBC: 3.94 MIL/uL — ABNORMAL LOW (ref 4.22–5.81)
RDW: 14.5 % (ref 11.5–15.5)
WBC: 9.3 10*3/uL (ref 4.0–10.5)
nRBC: 0.4 % — ABNORMAL HIGH (ref 0.0–0.2)

## 2019-07-02 LAB — CULTURE, BLOOD (ROUTINE X 2)
Culture: NO GROWTH
Special Requests: ADEQUATE

## 2019-07-02 LAB — FERRITIN: Ferritin: 84 ng/mL (ref 24–336)

## 2019-07-02 LAB — GLUCOSE, CAPILLARY
Glucose-Capillary: 195 mg/dL — ABNORMAL HIGH (ref 70–99)
Glucose-Capillary: 237 mg/dL — ABNORMAL HIGH (ref 70–99)

## 2019-07-02 LAB — D-DIMER, QUANTITATIVE: D-Dimer, Quant: 2.6 ug/mL-FEU — ABNORMAL HIGH (ref 0.00–0.50)

## 2019-07-02 LAB — C-REACTIVE PROTEIN: CRP: 2.5 mg/dL — ABNORMAL HIGH (ref <1.0)

## 2019-07-02 LAB — LACTATE DEHYDROGENASE: LDH: 172 U/L (ref 98–192)

## 2019-07-02 MED ORDER — CARVEDILOL 12.5 MG PO TABS
25.0000 mg | ORAL_TABLET | Freq: Two times a day (BID) | ORAL | Status: DC
  Administered 2019-07-02: 25 mg via ORAL
  Filled 2019-07-02: qty 2

## 2019-07-02 MED ORDER — ATORVASTATIN CALCIUM 40 MG PO TABS: 40.0000 mg | ORAL_TABLET | Freq: Every day | ORAL | 0 refills | Status: DC

## 2019-07-02 MED ORDER — CLONIDINE HCL 0.1 MG PO TABS
0.2000 mg | ORAL_TABLET | Freq: Three times a day (TID) | ORAL | Status: DC
  Administered 2019-07-02: 0.2 mg via ORAL
  Filled 2019-07-02: qty 2

## 2019-07-02 MED ORDER — IPRATROPIUM-ALBUTEROL 20-100 MCG/ACT IN AERS: 1.0000 | INHALATION_SPRAY | Freq: Four times a day (QID) | RESPIRATORY_TRACT | 0 refills | Status: DC

## 2019-07-02 MED ORDER — AMLODIPINE BESYLATE 10 MG PO TABS
10.0000 mg | ORAL_TABLET | Freq: Every day | ORAL | Status: DC
  Administered 2019-07-02: 10 mg via ORAL
  Filled 2019-07-02: qty 1

## 2019-07-02 MED ORDER — DEXAMETHASONE 6 MG PO TABS: 6.0000 mg | ORAL_TABLET | Freq: Every day | ORAL | 0 refills | Status: DC

## 2019-07-02 MED ORDER — LISINOPRIL 20 MG PO TABS: 20.0000 mg | ORAL_TABLET | Freq: Every day | ORAL | 0 refills | Status: DC

## 2019-07-02 MED ORDER — INSULIN PEN NEEDLE 29G X 12MM MISC: 10.0000 [IU] | Freq: Every day | 0 refills | Status: DC

## 2019-07-02 MED ORDER — CLONIDINE HCL 0.2 MG PO TABS: 0.2000 mg | ORAL_TABLET | Freq: Three times a day (TID) | ORAL | 0 refills | Status: DC

## 2019-07-02 MED ORDER — INSULIN GLARGINE 100 UNIT/ML ~~LOC~~ SOLN
10.0000 [IU] | Freq: Every day | SUBCUTANEOUS | Status: DC
  Administered 2019-07-02: 10 [IU] via SUBCUTANEOUS
  Filled 2019-07-02: qty 0.1

## 2019-07-02 MED ORDER — INSULIN GLARGINE 100 UNITS/ML SOLOSTAR PEN: 10.0000 [IU] | PEN_INJECTOR | Freq: Every day | SUBCUTANEOUS | 11 refills | Status: DC

## 2019-07-02 MED ORDER — LISINOPRIL 20 MG PO TABS
20.0000 mg | ORAL_TABLET | Freq: Every day | ORAL | Status: DC
  Administered 2019-07-02: 20 mg via ORAL
  Filled 2019-07-02: qty 1

## 2019-07-02 NOTE — Discharge Summary (Signed)
Physician Discharge Summary  William Moss BMW:[id number] DOB: November 03, 1985 DOA: 06/27/2019  PCP: Hoy Register, MD  Admit date: 06/27/2019 Discharge date: 07/02/2019  Time spent: 30 minutes  Recommendations for Outpatient Follow-up: Covid pneumonia/acute respiratory failure with hypoxia  -Decadron 6 mg daily -Remdesivir per pharmacy protocol -Patient significantly improved at completion of his treatment regimen should be able to be discharged home. Signed           07/02/19 0800  Oxygen Therapy  SpO2 95 %  O2 Device Room Air  Patient Activity (if Appropriate) Ambulating  Pulse Oximetry Type Continuous   6 min walk completed on RA, pt tolerated fine while maintaining pulse ox greater than 92% for duration of walk with no breaks.      -Patient does not qualify for home O2 stable for discharge after completion of last dose of remdesivir  OSA -See Covid pneumonia -Titrate O2 to maintain SPO2> 88% -On discharge will need to arrange with PCP for sleep study  Essential HTN -Amlodipine 10 mg daily -Coreg 25 mg BID -10/31 increase lisinopril 20 mg daily -11/1 increase clonidine 0.2 mg TID  Sinus tachycardia -Resolved after HTN medication started -See above -Resolved  Morbid obesity -2000 cal/day heart healthy/carb modified diet -Nutrition consult; uncontrolled diabetes, counseling on 2000 -calorie/day diet and control of diabetes -Diabetic coordinator; uncontrolled diabetic  Diabetes type 2 uncontrolled with complication -06/09/2018 hemoglobin A1c= 9.0 -9/14 = 6.0  -11/1 increase Lantus 10 units daily -Continue insulin until completion of steroids.  Then arranged with PCP for which diabetic medication to restart  HLD -10/29 LDL= 75 -10/29 increase Lipitor 40 mg daily    Discharge Diagnoses:  Principal Problem:   Sinus tachycardia Active Problems:   Essential hypertension   Morbid obesity (HCC)   Non-insulin dependent type 2 diabetes mellitus (HCC)  Coronary artery disease involving native heart   GERD (gastroesophageal reflux disease)   Sleep apnea   COVID-19 virus infection   Pneumonia due to COVID-19 virus   Diabetes mellitus type 2, uncontrolled, with complications (HCC)   Acute on chronic respiratory failure with hypoxia (HCC)   Discharge Condition: Stable  Diet recommendation: Carb modified  Filed Weights   06/28/19 0438  Weight: (!) 167 kg    History of present illness:  33 y.o.BM PMHx diabetes type 2 uncontrolled with complication, HTN, nonobstructive CAD, morbid obesity, HLD, DX COVID-19,   presents to the ER with progressive shortness of breath and right-sided chest pain in the last 2 days. Patient was diagnosed apparently was COVID-19 in the outpatient setting last Saturday. He was doing okay initially at home until the last 2 days. He has had low-grade fever and myalgias. He denied any significant cough. Denied any dizziness. No nausea and vomiting. Patient was seen in the ER and evaluated. He is not hypoxic but is very tachycardic. His chest x-ray showed no mild infiltrates. Patient has been admitted for aggressive hydration and management. Due to his comorbidities and nowbeing very still symptomatic.Marland Kitchen  Hospital Course:  During his hospitalization patient was treated for Covid pneumonia mild case.  Responded well to steroids and remdesivir.  Patient discharged on steroids and insulin to control diabetes.  Cleared for discharge    Cultures  10/27 SARS coronavirus positive 10/27 blood pending    Antibiotics Anti-infectives (From admission, onward)   Start     Stop   06/29/19 1000  remdesivir 100 mg in sodium chloride 0.9 % 250 mL IVPB     07/03/19 0959   06/28/19 1400  remdesivir 200 mg in sodium chloride 0.9 % 250 mL IVPB     06/28/19 1603       Discharge Exam: Vitals:   07/02/19 0000 07/02/19 0313 07/02/19 0734 07/02/19 0800  BP: (!) 150/95 (!) 151/101 (!) 171/114   Pulse:   60   Resp:   20 (!) 22   Temp: 98 F (36.7 C) 97.8 F (36.6 C) 98.8 F (37.1 C)   TempSrc: Oral Oral Oral   SpO2:  97% 98% 95%  Weight:      Height:        General: A/O x4, no acute respiratory distress Eyes: negative scleral hemorrhage, negative anisocoria, negative icterus ENT: Negative Runny nose, negative gingival bleeding, Neck:  Negative scars, masses, torticollis, lymphadenopathy, JVD Lungs: Clear to auscultation bilaterally without wheezes or crackles Cardiovascular: Regular rate and rhythm without murmur gallop or rub normal S1 and S2 Abdomen: MORBIDLY OBESE negative abdominal pain, nondistended, positive soft, bowel sounds, no rebound, no ascites, no appreciable mass  Discharge Instructions   Allergies as of 07/02/2019   No Known Allergies     Medication List    STOP taking these medications   glipiZIDE 10 MG tablet Commonly known as: GLUCOTROL   glipiZIDE 5 MG tablet Commonly known as: GLUCOTROL   Victoza 18 MG/3ML Sopn Generic drug: liraglutide     TAKE these medications   amLODipine 10 MG tablet Commonly known as: NORVASC Take 1 tablet (10 mg total) by mouth daily.   atorvastatin 40 MG tablet Commonly known as: LIPITOR Take 1 tablet (40 mg total) by mouth daily. What changed:   medication strength  how much to take   carvedilol 25 MG tablet Commonly known as: COREG Take 1 tablet (25 mg total) by mouth 2 (two) times daily.   cloNIDine 0.2 MG tablet Commonly known as: CATAPRES Take 1 tablet (0.2 mg total) by mouth 3 (three) times daily.   dexamethasone 6 MG tablet Commonly known as: DECADRON Take 1 tablet (6 mg total) by mouth daily. Start taking on: July 03, 2019   glucose blood test strip Commonly known as: True Metrix Blood Glucose Test 3 times daily before meals   insulin glargine 100 unit/mL Sopn Commonly known as: LANTUS Inject 0.1 mLs (10 Units total) into the skin daily.   Ipratropium-Albuterol 20-100 MCG/ACT Aers respimat Commonly  known as: COMBIVENT Inhale 1 puff into the lungs 4 (four) times daily.   lisinopril 20 MG tablet Commonly known as: ZESTRIL Take 1 tablet (20 mg total) by mouth daily. Start taking on: July 03, 2019 What changed:   medication strength  how much to take   metFORMIN 500 MG tablet Commonly known as: GLUCOPHAGE Take 2 tablets (1,000 mg total) by mouth 2 (two) times daily with a meal.   True Metrix Meter Devi 1 each by Does not apply route 3 (three) times daily before meals.   TRUEplus Lancets 28G Misc 1 each by Does not apply route 3 (three) times daily before meals.   TRUEplus Pen Needles 32G X 4 MM Misc Generic drug: Insulin Pen Needle Use as instructed with Victoza. What changed: Another medication with the same name was added. Make sure you understand how and when to take each.   Insulin Pen Needle 29G X 12MM Misc 10 Units by Does not apply route daily. What changed: You were already taking a medication with the same name, and this prescription was added. Make sure you understand how and when to take each.  No Known Allergies    The results of significant diagnostics from this hospitalization (including imaging, microbiology, ancillary and laboratory) are listed below for reference.    Significant Diagnostic Studies: Dg Chest Port 1 View  Result Date: 06/27/2019 CLINICAL DATA:  COVID-19 positive. Shortness of breath with chest pain EXAM: PORTABLE CHEST 1 VIEW COMPARISON:  Jan 18, 2019 FINDINGS: There is ill-defined opacity in the medial right base. The lungs elsewhere are clear. Heart is upper normal in size with pulmonary vascularity normal. No adenopathy. No bone lesions. IMPRESSION: Ill-defined airspace opacity consistent with pneumonia medial right base. No consolidation. Lungs elsewhere clear. Heart upper normal in size. No evident adenopathy. Electronically Signed   By: Lowella Grip III M.D.   On: 06/27/2019 19:54    Microbiology: Recent Results (from  the past 240 hour(s))  Blood Culture (routine x 2)     Status: None   Collection Time: 06/27/19  6:24 PM   Specimen: BLOOD  Result Value Ref Range Status   Specimen Description   Final    BLOOD RIGHT HAND Performed at [facility] [address], [place], [place] [address]    Special Requests   Final    BOTTLES DRAWN AEROBIC AND ANAEROBIC Blood Culture adequate volume Performed at [facility] [address] [place], [place] [address]    Culture   Final    NO GROWTH 5 DAYS Performed at [facility], [place] [address], [place], [place] [address]    Report Status 07/02/2019 FINAL  Final  SARS CORONAVIRUS 2 (TAT 6-24 HRS) Nasopharyngeal Nasopharyngeal Swab     Status: Abnormal   Collection Time: 06/27/19  9:03 PM   Specimen: Nasopharyngeal Swab  Result Value Ref Range Status   SARS Coronavirus 2 POSITIVE (A) NEGATIVE Final    Comment: RESULT CALLED TO, READ BACK BY AND VERIFIED WITH: L. Sherlyn Hay 0814 06/28/2019 T. TYSOR PATIENT IS ALREADY AT [facility] (NOTE) SARS-CoV-2 target nucleic acids are DETECTED. The SARS-CoV-2 RNA is generally detectable in upper and lower respiratory specimens during the acute phase of infection. Positive results are indicative of active infection with SARS-CoV-2. Clinical  correlation with patient history and other diagnostic information is necessary to determine patient infection status. Positive results do  not rule out bacterial infection or co-infection with other viruses. The expected result is Negative. Fact Sheet for Patients: SugarRoll.be Fact Sheet for Healthcare Providers: https://www.woods-mathews.com/ This test is not yet approved or cleared by the [place] FDA and  has been authorized for detection and/or diagnosis of SARS-CoV-2 by FDA under an Emergency Use Authorization (EUA). This EUA will remain  in effect (meani ng this test can be used) for the  duration of the COVID-19 declaration under Section 564(b)(1) of the Act, 21 U.S.C. section 360bbb-3(b)(1), unless the authorization is terminated or revoked sooner. Performed at [facility], [place] [address], [place], [place] [address]   Culture, blood (routine x 2)     Status: None (Preliminary result)   Collection Time: 06/28/19  1:30 PM   Specimen: BLOOD  Result Value Ref Range Status   Specimen Description   Final    BLOOD LEFT ARM Performed at [facility] [address] [place], [place] [address]    Special Requests   Final    BOTTLES DRAWN AEROBIC AND ANAEROBIC Blood Culture adequate volume Performed at [facility] [address] [place],  [address]    Culture   Final  NO GROWTH 4 DAYS Performed at [facility], [address] [place], [place] [address]    Report Status PENDING  Incomplete     Labs: Basic Metabolic Panel: Recent Labs  Lab 06/28/19 1330 06/29/19 0005 06/30/19 0000 07/01/19 0018 07/02/19 0018  NA 137 135 141 137 137  K 3.6 4.0 4.2 4.1 4.3  CL 107 109 113* 110 109  CO2 20* 19* 20* 19* 20*  GLUCOSE 163* 153* 183* 242* 271*  BUN 11 14 18  22* 22*  CREATININE 0.96 0.95 0.89 0.88 0.90  CALCIUM 8.8* 8.8* 9.1 8.7* 8.7*  MG 1.6* 1.8 1.7 1.8 1.8  PHOS 2.3* 1.9* 1.9* 2.7 3.2   Liver Function Tests: Recent Labs  Lab 06/28/19 1330 06/29/19 0005 06/30/19 0000 07/01/19 0018 07/02/19 0018  AST 15 17 13* 12* 12*  ALT 20 24 25 23 25   ALKPHOS 57 58 54 51 46  BILITOT 1.0 0.5 0.3 0.4 0.2*  PROT 7.6 7.7 7.7 6.9 6.3*  ALBUMIN 3.3* 3.3* 3.3* 2.9* 2.7*   No results for input(s): LIPASE, AMYLASE in the last 168 hours. No results for input(s): AMMONIA in the last 168 hours. CBC: Recent Labs  Lab 06/28/19 1330 06/29/19 0005 06/30/19 0000 07/01/19 0018 07/02/19 0018  WBC 9.6 9.7 11.0* 9.6 9.3  NEUTROABS 6.2 7.7 8.4* 7.2 6.5  HGB 11.7* 11.5* 11.2* 10.7* 10.7*  HCT 35.6* 35.7* 34.3* 33.1*  33.7*  MCV 83.2 83.6 83.7 83.6 85.5  PLT 252 273 274 265 292   Cardiac Enzymes: No results for input(s): CKTOTAL, CKMB, CKMBINDEX, TROPONINI in the last 168 hours. BNP: BNP (last 3 results) No results for input(s): BNP in the last 8760 hours.  ProBNP (last 3 results) No results for input(s): PROBNP in the last 8760 hours.  CBG: Recent Labs  Lab 07/01/19 0830 07/01/19 1120 07/01/19 1638 07/02/19 0001 07/02/19 0305  GLUCAP 115* 198* 318* 237* 195*       Signed:  13/01/20, MD [facility] [phone] pager

## 2019-07-02 NOTE — Discharge Instructions (Signed)
Person Under Monitoring Name: William Moss  Location: [place] [address]   CORONAVIRUS DISEASE 2019 (COVID-19) Guidance for Persons Under Investigation You are being tested for the virus that causes coronavirus disease 2019 (COVID-19). Public health actions are necessary to ensure protection of your health and the health of others, and to prevent further spread of infection. COVID-19 is caused by a virus that can cause symptoms, such as fever, cough, and shortness of breath. The primary transmission from person to person is by coughing or sneezing. On September 29, 2018, the [place] announced a [employer] Emergency of International Concern and on September 30, 2018 the U.S. Department of Health and Human Services declared a public health emergency. If the virus that causesCOVID-19 spreads in the community, it could have severe public health consequences.  As a person under investigation for COVID-19, the [place] advises you to adhere to the following guidance until your test results are reported to you. If your test result is positive, you will receive additional information from your provider and your local health department at that time.   Remain at home until you are cleared by your health provider or public health authorities.   Keep a log of visitors to your home using the form provided. Any visitors to your home must be aware of your isolation status.  If you plan to move to a new address or leave the county, notify the local health department in your county.  Call a doctor or seek care if you have an urgent medical need. Before seeking medical care, call ahead and get instructions from the provider before arriving at the medical office, clinic or hospital. Notify them that you are being tested for the virus that causes COVID-19 so arrangements can be made, as necessary, to prevent  transmission to others in the healthcare setting. Next, notify the local health department in your county.  If a medical emergency arises and you need to call 911, inform the first responders that you are being tested for the virus that causes COVID-19. Next, notify the local health department in your county.  Adhere to all guidance set forth by the [place] for [facility] of patients that is based on guidance from the Center for Disease Control and Prevention with suspected or confirmed COVID-19. It is provided with this guidance for Persons Under Investigation.  Your health and the health of our community are our top priorities. Public Health officials remain available to provide assistance and counseling to you about COVID-19 and compliance with this guidance.  Provider: ____________________________________________________________ Date: ______/_____/_________  By signing below, you acknowledge that you have read and agree to comply with this Guidance for Persons Under Investigation. ______________________________________________________________ Date: ______/_____/_________  WHO DO I CALL? You can find a list of local health departments here: [email or web address] Health Department: ____________________________________________________________________ Contact Name: ________________________________________________________________________ Telephone: ___________________________________________________________________________  [facility], [place], Communicable Disease Branch COVID-19 Guidance for Persons Under Investigation November 05, 2018 Carbohydrate Counting For People With Diabetes  Why Is Carbohydrate Counting Important?  Counting carbohydrate servings may help you control your blood glucose level so that you feel better.   The balance between the carbohydrates you eat and insulin  determines what your blood glucose level will be after eating.   Carbohydrate counting can also help you plan your meals. Which Foods Have Carbohydrates? Foods with carbohydrates include:  Breads, crackers, and cereals   Pasta, rice, and grains   Starchy vegetables, such as potatoes, corn, and peas   Beans and legumes   Milk, soy milk, and yogurt   Fruits and fruit juices   Sweets, such as cakes, cookies, ice cream, jam, and jelly Carbohydrate Servings In diabetes meal planning, 1 serving of a food with carbohydrate has about 15 grams of carbohydrate:  Check serving sizes with measuring cups and spoons or a food scale.   Read the Nutrition Facts on food labels to find out how many grams of carbohydrate are in foods you eat. The food lists in this handout show portions that have about 15 grams of carbohydrate.  Tips Meal Planning Tips  An Eating Plan tells you how many carbohydrate servings to eat at your meals and snacks. For many adults, eating 3 to 5 servings of carbohydrate foods at each meal and 1 or 2 carbohydrate servings for each snack works well.   In a healthy daily Eating Plan, most carbohydrates come from:   At least 6 servings of fruits and nonstarchy vegetables   At least 6 servings of grains, beans, and starchy vegetables, with at least 3 servings from whole grains   At least 2 servings of milk or milk products  Check your blood glucose level regularly. It can tell you if you need to adjust when you eat carbohydrates.   Eating foods that have fiber, such as whole grains, and having very few salty foods is good for your health.   Eat 4 to 6 ounces of meat or other protein foods (such as soybean burgers) each day. Choose low-fat sources of protein, such as lean beef, lean pork, chicken, fish, low-fat cheese, or vegetarian foods such as soy.   Eat some healthy fats, such as olive oil, canola oil, and nuts.   Eat very little saturated fats. These unhealthy  fats are found in butter, cream, and high-fat meats, such as bacon and sausage.   Eat very little or no trans fats. These unhealthy fats are found in all foods that list partially hydrogenated oil as an ingredient.  Label Reading Tips The Nutrition Facts panel on a label lists the grams of total carbohydrate in 1 standard serving. The label's standard serving may be larger or smaller than 1 carbohydrate serving. To figure out how many carbohydrate servings are in the food:  First, look at the label's standard serving size.   Check the grams of total carbohydrate. This is the amount of carbohydrate in 1 standard serving.   Divide the grams of total carbohydrate by 15. This number equals the number of carbohydrate servings in 1 standard serving. Remember: 1 carbohydrate serving is 15 grams of carbohydrate.   Note: You may ignore the grams of sugars on the Nutrition Facts panel because they are included in the grams of total carbohydrate.  Foods Recommended 1 serving = about 15 grams of carbohydrate Starches  1 slice bread (1 ounce)   1 tortilla (6-inch size)    large bagel (1 ounce)   2 taco shells (5-inch size)    hamburger or hot dog bun ( ounce)    cup ready-to-eat unsweetened cereal    cup cooked cereal   1 cup broth-based soup   4 to 6 small crackers   1/3 cup pasta or rice (cooked)    cup beans, peas, corn, sweet potatoes, winter squash, or mashed or boiled potatoes (cooked)    large baked potato (3 ounces)  ounce pretzels, potato chips, or tortilla chips   3 cups popcorn (popped) Fruit  1 small fresh fruit ( to 1 cup)    cup canned or frozen fruit   2 tablespoons dried fruit (blueberries, cherries, cranberries, mixed fruit, raisins)   17 small grapes (3 ounces)   1 cup melon or berries    cup unsweetened fruit juice Milk  1 cup fat-free or reduced-fat milk   1 cup soy milk   2/3 cup (6 ounces) nonfat yogurt sweetened with sugar-free  sweetener Sweets and Desserts  2-inch square cake (unfrosted)   2 small cookies (2/3 ounce)    cup ice cream or frozen yogurt    cup sherbet or sorbet   1 tablespoon syrup, jam, jelly, table sugar, or honey   2 tablespoons light syrup Other Foods  Count 1 cup raw vegetables or  cup cooked nonstarchy vegetables as zero (0) carbohydrate servings or free foods. If you eat 3 or more servings at one meal, count them as 1 carbohydrate serving.   Foods that have less than 20 calories in each serving also may be counted as zero carbohydrate servings or free foods.   Count 1 cup of casserole or other mixed foods as 2 carbohydrate servings.  Carbohydrate Counting for People with Diabetes Sample 1-Day Menu  Breakfast 1 extra-small banana (1 carbohydrate serving)  1 cup low-fat or fat-free milk (1 carbohydrate serving)  1 slice whole wheat bread (1 carbohydrate serving)  1 teaspoon margarine  Lunch 2 ounces [place] slices  2 slices whole wheat bread (2 carbohydrate servings)  2 lettuce leaves  4 celery sticks  4 carrot sticks  1 medium apple (1 carbohydrate serving)  1 cup low-fat or fat-free milk (1 carbohydrate serving)  Afternoon Snack 2 tablespoons raisins (1 carbohydrate serving)  3/4 ounce unsalted mini pretzels (1 carbohydrate serving)  Evening Meal 3 ounces lean roast beef  1/2 large baked potato (2 carbohydrate servings)  1 tablespoon reduced-fat sour cream  1/2 cup green beans  1 tablespoon light salad dressing  1 whole wheat dinner roll (1 carbohydrate serving)  1 teaspoon margarine  1 cup melon balls (1 carbohydrate serving)  Evening Snack 2 tablespoons unsalted nuts   Carbohydrate Counting for People with Diabetes Vegan Sample 1-Day Menu  Breakfast 1 cup cooked oatmeal (2 carbohydrate servings)   cup blueberries (1 carbohydrate serving)  2 tablespoons flaxseeds  1 cup soymilk fortified with calcium and vitamin D  1 cup coffee  Lunch 2 slices whole wheat  bread (2 carbohydrate servings)   cup baked tofu   cup lettuce  2 slices tomato  2 slices avocado   cup baby carrots  1 orange (1 carbohydrate serving)  1 cup soymilk fortified with calcium and vitamin D   Evening Meal Burrito made with: 1 6-inch corn tortilla (1 carbohydrate serving)  1 cup refried vegetarian beans (1 carbohydrate serving)   cup chopped tomatoes   cup lettuce   cup salsa  1/3 cup brown rice (1 carbohydrate serving)  1 tablespoon olive oil for rice   cup zucchini   Evening Snack 6 small whole grain crackers (1 carbohydrate serving)  2 apricots ( carbohydrate serving)   cup unsalted peanuts ( carbohydrate serving)     Carbohydrate Counting for People with Diabetes Vegetarian (Lacto-Ovo) Sample 1-Day Menu  Breakfast 1 cup cooked oatmeal (2 carbohydrate servings)   cup blueberries (1 carbohydrate serving)  2 tablespoons flaxseeds  1 egg  1 cup 1% milk (1 carbohydrate  serving)  1 cup coffee  Lunch 2 slices whole wheat bread (2 carbohydrate servings)  2 ounces low-fat cheese   cup lettuce  2 slices tomato  2 slices avocado   cup baby carrots  1 orange (1 carbohydrate serving)  1 cup unsweetened tea  Evening Meal Burrito made with: 1 6-inch corn tortilla (1 carbohydrate serving)   cup refried vegetarian beans (1 carbohydrate serving)   cup tomatoes   cup lettuce   cup salsa  1/3 cup brown rice (1 carbohydrate serving)  1 tablespoon olive oil for rice   cup zucchini  1 cup 1% milk (1 carbohydrate serving)  Evening Snack 6 small whole grain crackers (1 carbohydrate serving)  2 apricots ( carbohydrate serving)   cup unsalted peanuts ( carbohydrate serving)    Copyright 2020  Academy of [employer] and Dietetics. All rights reserved.  Using Nutrition Labels: Carbohydrate   Serving Size   Look at the serving size. All the information on the label is based on this portion.  Servings Per Container   The number of servings contained  in the package.  Guidelines for Carbohydrate   Look at the total grams of carbohydrate in the serving size.   1 carbohydrate choice = 15 grams of carbohydrate. Range of Carbohydrate Grams Per Choice  Carbohydrate Grams/Choice Carbohydrate Choices  6-10   11-20 1  21-25 1  26-35 2  36-40 2  41-50 3  51-55 3  56-65 4  66-70 4  71-80 5    Copyright 2020  Academy of [employer] and Dietetics. All rights reserved.

## 2019-07-02 NOTE — Progress Notes (Signed)
Inpatient Diabetes Program Recommendations  AACE/ADA: New Consensus Statement on Inpatient Glycemic Control   Target Ranges:  Prepandial:   less than 140 mg/dL      Peak postprandial:   less than 180 mg/dL (1-2 hours)      Critically ill patients:  140 - 180 mg/dL   Results for William Moss, William Moss (MRN [id number]) as of 07/02/2019 07:44  Ref. Range 07/01/2019 08:30 07/01/2019 11:20 07/01/2019 16:38 07/01/2019 19:47 07/02/2019 00:01 07/02/2019 03:05  Glucose-Capillary Latest Ref Range: 70 - 99 mg/dL 115 (H)  Lantus 5 units 198 (H)  Novolog 4 units 318 (H)  Novolog 15 units   Novolog 20 units 237 (H)  Novolog 7 units   195 (H)  Novolog 4 units   Review of Glycemic Control  Diabetes history:DM2 Outpatient Diabetes medications:Glipizide 10 mg BID, Metformin 1000 mg BID, Victoza 1.8 mg daily Current orders for Inpatient glycemic control:Lantus 5 units daily, Novolog 0-20 units Q4H; Decadron 6 mg daily  Inpatient Diabetes Program Recommendations:  Insulin-Meal Coverage: Post prandial glucose consistently elevated. While ordered steroids, please consider ordering Novolog5units TID with meals for meal coverage if patient eats at least 50% of meals.  Insulin-Basal:If steroids are continued,  please consider increasing Lantus to 10 units daily.   Thanks, Barnie Alderman, RN, MSN, CDE Diabetes Coordinator Inpatient Diabetes Program [phone] (Team Pager from 8am to 5pm)

## 2019-07-02 NOTE — Progress Notes (Signed)
   07/02/19 0800  Oxygen Therapy  SpO2 95 %  O2 Device Room Air  Patient Activity (if Appropriate) Ambulating  Pulse Oximetry Type Continuous   6 min walk completed on RA, pt tolerated fine while maintaining pulse ox greater than 92% for duration of walk with no breaks.

## 2019-07-03 ENCOUNTER — Other Ambulatory Visit: Payer: Self-pay | Admitting: Family Medicine

## 2019-07-03 ENCOUNTER — Encounter: Payer: Self-pay | Admitting: Family Medicine

## 2019-07-03 DIAGNOSIS — E119 Type 2 diabetes mellitus without complications: Secondary | ICD-10-CM

## 2019-07-03 LAB — CULTURE, BLOOD (ROUTINE X 2)
Culture: NO GROWTH
Special Requests: ADEQUATE

## 2019-07-03 LAB — GLUCOSE, CAPILLARY
Glucose-Capillary: 370 mg/dL — ABNORMAL HIGH (ref 70–99)
Glucose-Capillary: 143 mg/dL — ABNORMAL HIGH (ref 70–99)

## 2019-07-03 MED ORDER — TRUEPLUS LANCETS 28G MISC: 1.0000 | Freq: Three times a day (TID) | 12 refills | Status: DC

## 2019-07-03 MED ORDER — TRUE METRIX BLOOD GLUCOSE TEST VI STRP: ORAL_STRIP | 12 refills | Status: DC

## 2019-07-04 ENCOUNTER — Other Ambulatory Visit: Payer: Self-pay | Admitting: Pharmacist

## 2019-07-04 ENCOUNTER — Other Ambulatory Visit: Payer: Self-pay | Admitting: Family Medicine

## 2019-07-04 MED ORDER — BASAGLAR KWIKPEN 100 UNIT/ML ~~LOC~~ SOPN: 8.0000 [IU] | PEN_INJECTOR | Freq: Every day | SUBCUTANEOUS | 1 refills | Status: DC

## 2019-07-04 MED ORDER — TRUEPLUS PEN NEEDLES 32G X 4 MM MISC: 11 refills | Status: DC

## 2019-07-05 NOTE — Telephone Encounter (Signed)
Please schedule a tele follow up visit with his DM readings

## 2019-07-17 ENCOUNTER — Encounter: Payer: Self-pay | Admitting: Family Medicine

## 2019-07-21 ENCOUNTER — Encounter: Payer: Self-pay | Admitting: Family Medicine

## 2019-07-24 ENCOUNTER — Telehealth: Payer: 59 | Admitting: Physician Assistant

## 2019-07-24 ENCOUNTER — Encounter: Payer: Self-pay | Admitting: Family Medicine

## 2019-07-24 DIAGNOSIS — U071 COVID-19: Secondary | ICD-10-CM

## 2019-07-24 NOTE — Progress Notes (Signed)
  Your test for COVID-19 was positive on 06/27/2019, meaning that you were infected with the novel coronavirus and could give the germ to others.    You have been enrolled in [facility] for COVID-19. Daily you will receive a questionnaire within the [facility] website. Our COVID-19 response team will be monitoring your responses daily.  Please continue isolation at home, for at least 10 days since the start of your symptoms and until you have had 24 hours with no fever (without taking a fever reducer) and with improving of symptoms.   It appears you are still being followed by your primary doctor Dr. Charlane Ferretti for this and she has written you to stay out of work until November 30.   Please continue good preventive care measures, including:  frequent hand-washing, avoid touching your face, cover coughs/sneezes, stay out of crowds and keep a 6 foot distance from others.  Recheck or go to the nearest hospital ED tent for re-assessment if fever/cough/breathlessness return or you feel that your are worsening ant not improving. Specifically go to the ER if you are significantly short of breath or are unable to hold any fluids. Drink plenty of fluids to stay hydrated.  Thank you for allowing me to care for you today.   10 minutes spent on this chart

## 2019-07-25 ENCOUNTER — Ambulatory Visit (HOSPITAL_BASED_OUTPATIENT_CLINIC_OR_DEPARTMENT_OTHER): Payer: 59 | Admitting: Family Medicine

## 2019-07-25 ENCOUNTER — Encounter: Payer: Self-pay | Admitting: Family Medicine

## 2019-07-25 DIAGNOSIS — U071 COVID-19: Secondary | ICD-10-CM

## 2019-07-25 NOTE — Progress Notes (Signed)
Patient verified DOB Patient has not eaten or taken medication today. Patients benefits need a documented OV showing that patient is still have diarrhea, fatigue, runny nose and congestion.

## 2019-07-25 NOTE — Progress Notes (Signed)
Virtual Visit via Telephone Note  I connected with William Moss, on 07/25/2019 at 8:58 AM by telephone due to the COVID-19 pandemic and verified that I am speaking with the correct person using two identifiers.   Consent: I discussed the limitations, risks, security and privacy concerns of performing an evaluation and management service by telephone and the availability of in person appointments. I also discussed with the patient that there may be a patient responsible charge related to this service. The patient expressed understanding and agreed to proceed.   Location of Patient: Home  Location of Provider: Clinic   Persons participating in Telemedicine visit: Nikolos Billig [place]-CMA Dr. Margarita Rana     History of Present Illness: William Moss is a 33 year old male with a history of hypertension, type 2 diabetes mellitus (A1c 6.5) , morbid obesity, GERD hospitalized for COVID-19 pneumonia last month at [facility] from 06/27/2019 through 07/02/2019. Treated with remdesivir and steroids with improvement in symptoms and subsequently discharged on dexamethasone and Combivent. He has completed his course of dexamethasone but has been unable to return to work due to ongoing symptoms and had requested an extension of his short-term disability due to same.Marland Kitchen He needs documentation stating his time off work had been extended to next Monday 11/30 after a [place] conversation with me from last week. Fatigue is better. He still has diarrhea, congestion, runny nose. Denies dyspnea, chest pain. He is also using the Combivent MDI. His sugars have been in the 100s with the highest around 230.  Past Medical History:  Diagnosis Date  . Atypical chest pain 05/27/2016   a. 05/2016: NST showing small area of moderare anteroapical ischemia b. 05/2016: cath showing tortous but normal cors which confirmed a false positive NST.  . Diabetes mellitus without complication ([place])   . GERD  (gastroesophageal reflux disease) 10/02/2016  . Hypertension    No Known Allergies  Current Outpatient Medications on File Prior to Visit  Medication Sig Dispense Refill  . amLODipine (NORVASC) 10 MG tablet Take 1 tablet (10 mg total) by mouth daily. 90 tablet 1  . atorvastatin (LIPITOR) 40 MG tablet Take 1 tablet (40 mg total) by mouth daily. 30 tablet 0  . Blood Glucose Monitoring Suppl (TRUE METRIX METER) DEVI 1 each by Does not apply route 3 (three) times daily before meals. 1 Device 0  . carvedilol (COREG) 25 MG tablet Take 1 tablet (25 mg total) by mouth 2 (two) times daily. 180 tablet 1  . cloNIDine (CATAPRES) 0.2 MG tablet Take 1 tablet (0.2 mg total) by mouth 3 (three) times daily. 90 tablet 0  . dexamethasone (DECADRON) 6 MG tablet Take 1 tablet (6 mg total) by mouth daily. 10 tablet 0  . glucose blood (TRUE METRIX BLOOD GLUCOSE TEST) test strip 3 times daily before meals 100 each 12  . Insulin Glargine (BASAGLAR KWIKPEN) 100 UNIT/ML SOPN Inject 0.08 mLs (8 Units total) into the skin daily. 30 mL 1  . Insulin Pen Needle (TRUEPLUS PEN NEEDLES) 32G X 4 MM MISC Use as instructed with Victoza. 100 each 11  . Insulin Pen Needle 29G X 12MM MISC 10 Units by Does not apply route daily. 100 each 0  . Ipratropium-Albuterol (COMBIVENT) 20-100 MCG/ACT AERS respimat Inhale 1 puff into the lungs 4 (four) times daily. 4 g 0  . lisinopril (ZESTRIL) 20 MG tablet Take 1 tablet (20 mg total) by mouth daily. 30 tablet 0  . metFORMIN (GLUCOPHAGE) 500 MG tablet Take 2 tablets (1,000 mg  total) by mouth 2 (two) times daily with a meal. 360 tablet 1  . TRUEplus Lancets 28G MISC 1 each by Does not apply route 3 (three) times daily before meals. 100 each 12   No current facility-administered medications on file prior to visit.     Observations/Objective: Awake, alert, oriented x3 Not in acute distress.  Lab Results  Component Value Date   HGBA1C 6.5 (H) 06/29/2019    Assessment and Plan: 1. COVID-19  virus infection Treated with remdesivir and steroids Respiratory symptoms have improved but diarrhea still persist Continue intake of fluids, rest Advised he will need to remain in isolation until at least 10 days symptom-free    Follow Up Instructions: Keep previously scheduled appointment   I discussed the assessment and treatment plan with the patient. The patient was provided an opportunity to ask questions and all were answered. The patient agreed with the plan and demonstrated an understanding of the instructions.   The patient was advised to call back or seek an in-person evaluation if the symptoms worsen or if the condition fails to improve as anticipated.     I provided 10 minutes total of non-face-to-face time during this encounter including median intraservice time, reviewing previous notes, labs, imaging, medications, management and patient verbalized understanding.     Hoy Register, MD, FAAFP. [facility] [place], [place] [phone]   07/25/2019, 8:58 AM

## 2019-07-26 ENCOUNTER — Encounter: Payer: Self-pay | Admitting: Family Medicine

## 2019-08-17 ENCOUNTER — Other Ambulatory Visit: Payer: Self-pay | Admitting: Family Medicine

## 2019-08-17 DIAGNOSIS — I10 Essential (primary) hypertension: Secondary | ICD-10-CM

## 2019-08-17 NOTE — Telephone Encounter (Signed)
Please refill if appropriate. Patient last refill 11/10 by a different provider for only 1 month.

## 2019-08-22 ENCOUNTER — Encounter: Payer: Self-pay | Admitting: Family Medicine

## 2019-08-22 NOTE — Telephone Encounter (Signed)
Please inform patient if paperwork was faxed

## 2019-08-23 ENCOUNTER — Encounter: Payer: Self-pay | Admitting: Family Medicine

## 2019-09-03 ENCOUNTER — Other Ambulatory Visit: Payer: Self-pay | Admitting: Family Medicine

## 2019-09-03 DIAGNOSIS — E1165 Type 2 diabetes mellitus with hyperglycemia: Secondary | ICD-10-CM

## 2019-09-08 ENCOUNTER — Encounter: Payer: Self-pay | Admitting: Family Medicine

## 2019-09-15 ENCOUNTER — Encounter: Payer: Self-pay | Admitting: Family Medicine

## 2019-09-28 ENCOUNTER — Encounter: Payer: Self-pay | Admitting: Family Medicine

## 2019-11-13 DIAGNOSIS — G4733 Obstructive sleep apnea (adult) (pediatric): Secondary | ICD-10-CM | POA: Diagnosis not present

## 2019-11-26 ENCOUNTER — Ambulatory Visit: Payer: Self-pay | Attending: Internal Medicine

## 2019-11-26 DIAGNOSIS — Z23 Encounter for immunization: Secondary | ICD-10-CM

## 2019-11-26 NOTE — Progress Notes (Signed)
   Covid-19 Vaccination Clinic  Name:  William Moss    MRN: [id number] DOB: Feb 27, 1986  11/26/2019  [name] was observed post Covid-19 immunization for 15 minutes without incident. He was provided with Vaccine Information Sheet and instruction to access the V-Safe system.   [name] was instructed to call 911 with any severe reactions post vaccine: Marland Kitchen Difficulty breathing  . Swelling of face and throat  . A fast heartbeat  . A bad rash all over body  . Dizziness and weakness   Immunizations Administered    Name Date Dose VIS Date Route   Pfizer COVID-19 Vaccine 11/26/2019  1:56 PM 0.3 mL 08/11/2019 Intramuscular   Manufacturer: [employer], [employer]   Lot: [id number]   NDC: [address]-7

## 2019-12-14 DIAGNOSIS — G4733 Obstructive sleep apnea (adult) (pediatric): Secondary | ICD-10-CM | POA: Diagnosis not present

## 2019-12-26 ENCOUNTER — Ambulatory Visit: Payer: Self-pay | Attending: Internal Medicine

## 2019-12-26 DIAGNOSIS — Z23 Encounter for immunization: Secondary | ICD-10-CM

## 2019-12-26 NOTE — Progress Notes (Signed)
   Covid-19 Vaccination Clinic  Name:  William Moss    MRN: [id number] DOB: 11/16/1985  12/26/2019  William Moss was observed post Covid-19 immunization for 15 minutes without incident. He was provided with Vaccine Information Sheet and instruction to access the V-Safe system.   William Moss was instructed to call 911 with any severe reactions post vaccine: Marland Kitchen Difficulty breathing  . Swelling of face and throat  . A fast heartbeat  . A bad rash all over body  . Dizziness and weakness   Immunizations Administered    Name Date Dose VIS Date Route   Pfizer COVID-19 Vaccine 12/26/2019  8:12 AM 0.3 mL 10/25/2018 Intramuscular   Manufacturer: [employer], [employer]   Lot: [id number]   NDC: [address]-0

## 2020-01-10 ENCOUNTER — Other Ambulatory Visit: Payer: Self-pay | Admitting: Family Medicine

## 2020-01-10 DIAGNOSIS — E1169 Type 2 diabetes mellitus with other specified complication: Secondary | ICD-10-CM

## 2020-01-13 DIAGNOSIS — G4733 Obstructive sleep apnea (adult) (pediatric): Secondary | ICD-10-CM | POA: Diagnosis not present

## 2020-01-19 ENCOUNTER — Encounter: Payer: Self-pay | Admitting: Family Medicine

## 2020-01-19 ENCOUNTER — Other Ambulatory Visit: Payer: Self-pay | Admitting: Family Medicine

## 2020-01-19 DIAGNOSIS — E1169 Type 2 diabetes mellitus with other specified complication: Secondary | ICD-10-CM

## 2020-01-19 MED ORDER — METFORMIN HCL 500 MG PO TABS: 1000.0000 mg | ORAL_TABLET | Freq: Two times a day (BID) | ORAL | 1 refills | Status: DC

## 2020-02-28 ENCOUNTER — Other Ambulatory Visit: Payer: Self-pay

## 2020-02-28 ENCOUNTER — Ambulatory Visit: Payer: BC Managed Care – PPO | Attending: Family Medicine | Admitting: Family Medicine

## 2020-02-28 ENCOUNTER — Encounter: Payer: Self-pay | Admitting: Family Medicine

## 2020-02-28 VITALS — BP 169/114 | HR 96 | Ht 69.0 in | Wt 379.0 lb

## 2020-02-28 DIAGNOSIS — Z1159 Encounter for screening for other viral diseases: Secondary | ICD-10-CM | POA: Diagnosis not present

## 2020-02-28 DIAGNOSIS — I10 Essential (primary) hypertension: Secondary | ICD-10-CM | POA: Diagnosis not present

## 2020-02-28 DIAGNOSIS — E1169 Type 2 diabetes mellitus with other specified complication: Secondary | ICD-10-CM | POA: Diagnosis not present

## 2020-02-28 LAB — POCT GLYCOSYLATED HEMOGLOBIN (HGB A1C): HbA1c, POC (controlled diabetic range): 6.4 % (ref 0.0–7.0)

## 2020-02-28 LAB — GLUCOSE, POCT (MANUAL RESULT ENTRY): POC Glucose: 145 mg/dl — AB (ref 70–99)

## 2020-02-28 MED ORDER — LISINOPRIL 40 MG PO TABS: 40.0000 mg | ORAL_TABLET | Freq: Every day | ORAL | 1 refills | Status: DC

## 2020-02-28 MED ORDER — CARVEDILOL 25 MG PO TABS: 25.0000 mg | ORAL_TABLET | Freq: Two times a day (BID) | ORAL | 1 refills | Status: DC

## 2020-02-28 MED ORDER — AMLODIPINE BESYLATE 10 MG PO TABS: 10.0000 mg | ORAL_TABLET | Freq: Every day | ORAL | 1 refills | Status: DC

## 2020-02-28 MED ORDER — BASAGLAR KWIKPEN 100 UNIT/ML ~~LOC~~ SOPN: 8.0000 [IU] | PEN_INJECTOR | Freq: Every day | SUBCUTANEOUS | 1 refills | Status: DC

## 2020-02-28 MED ORDER — METFORMIN HCL 500 MG PO TABS: 1000.0000 mg | ORAL_TABLET | Freq: Two times a day (BID) | ORAL | 1 refills | Status: DC

## 2020-02-28 MED ORDER — ATORVASTATIN CALCIUM 40 MG PO TABS: 40.0000 mg | ORAL_TABLET | Freq: Every day | ORAL | 1 refills | Status: DC

## 2020-02-28 NOTE — Progress Notes (Signed)
Subjective:  Patient ID: William Moss, male    DOB: [birth year]  Age: 34 y.o. MRN: [id number]  CC: Diabetes   HPI William Moss is a 34 year old male with a history of hypertension, type 2 diabetes mellitus (A1c 6.4) , morbid obesity, GERD hospitalized for COVID-19 pneumonia last month at [facility] from 06/27/2019 through 07/02/2019. He did have long-haul symptoms of COVID-19 pneumonia including fatigue and dyspnea but states he is back to his baseline at this time. He switched jobs from [employer] to [employer] and states he is less stressed as he now works with [employer] support.  He has been out of his antihypertensive and he endorses being out of his antihypertensives for several months but has been compliant with his Diabetic medications. Denies presence of hypoglycemia, neuropathy symptoms or visual complaints. He is not up to date on annual eye exams. He does not exercise much but plans to return to the gym. He has no additional concerns today.  Past Medical History:  Diagnosis Date  . Atypical chest pain 05/27/2016   a. 05/2016: NST showing small area of moderare anteroapical ischemia b. 05/2016: cath showing tortous but normal cors which confirmed a false positive NST.  . Diabetes mellitus without complication ([place])   . GERD (gastroesophageal reflux disease) 10/02/2016  . Hypertension     Past Surgical History:  Procedure Laterality Date  . CARDIAC CATHETERIZATION N/A 06/10/2016   Procedure: Left Heart Cath and Coronary Angiography;  Surgeon: Wellington Hampshire, MD;  Location: [facility] CV LAB;  Service: Cardiovascular;  Laterality: N/A;  . COLONOSCOPY N/A 02/04/2016   Procedure: COLONOSCOPY;  Surgeon: Irene Shipper, MD;  Location: [facility] ENDOSCOPY;  Service: Endoscopy;  Laterality: N/A;  . NO PAST SURGERIES      Family History  Problem Relation Age of Onset  . Diabetes Mother   . Diabetes Father   . Cancer Maternal Grandmother   . Breast cancer Cousin   . Alzheimer's disease  Paternal Grandmother     No Known Allergies  Outpatient Medications Prior to Visit  Medication Sig Dispense Refill  . atorvastatin (LIPITOR) 40 MG tablet Take 1 tablet (40 mg total) by mouth daily. 30 tablet 0  . Blood Glucose Monitoring Suppl (TRUE METRIX METER) DEVI 1 each by Does not apply route 3 (three) times daily before meals. 1 Device 0  . carvedilol (COREG) 25 MG tablet Take 1 tablet (25 mg total) by mouth 2 (two) times daily. 180 tablet 1  . cloNIDine (CATAPRES) 0.2 MG tablet Take 1 tablet (0.2 mg total) by mouth 3 (three) times daily. 90 tablet 0  . dexamethasone (DECADRON) 6 MG tablet Take 1 tablet (6 mg total) by mouth daily. 10 tablet 0  . glucose blood (TRUE METRIX BLOOD GLUCOSE TEST) test strip 3 times daily before meals 100 each 12  . Insulin Glargine (BASAGLAR KWIKPEN) 100 UNIT/ML SOPN Inject 0.08 mLs (8 Units total) into the skin daily. 30 mL 1  . Insulin Pen Needle (TRUEPLUS PEN NEEDLES) 32G X 4 MM MISC Use as instructed with Victoza. 100 each 11  . Insulin Pen Needle 29G X 12MM MISC 10 Units by Does not apply route daily. 100 each 0  . Ipratropium-Albuterol (COMBIVENT) 20-100 MCG/ACT AERS respimat Inhale 1 puff into the lungs 4 (four) times daily. 4 g 0  . lisinopril (ZESTRIL) 20 MG tablet Take 1 tablet (20 mg total) by mouth daily. 30 tablet 0  . lisinopril (ZESTRIL) 40 MG tablet TAKE 1 TABLET  BY MOUTH EVERY DAY 90 tablet 1  . metFORMIN (GLUCOPHAGE) 500 MG tablet Take 2 tablets (1,000 mg total) by mouth 2 (two) times daily with a meal. 360 tablet 1  . TRUEplus Lancets 28G MISC 1 each by Does not apply route 3 (three) times daily before meals. 100 each 12  . amLODipine (NORVASC) 10 MG tablet Take 1 tablet (10 mg total) by mouth daily. 90 tablet 1   No facility-administered medications prior to visit.     ROS Review of Systems  Constitutional: Negative for activity change and appetite change.  HENT: Negative for sinus pressure and sore throat.   Eyes: Negative for  visual disturbance.  Respiratory: Negative for cough, chest tightness and shortness of breath.   Cardiovascular: Negative for chest pain and leg swelling.  Gastrointestinal: Negative for abdominal distention, abdominal pain, constipation and diarrhea.  Endocrine: Negative.   Genitourinary: Negative for dysuria.  Musculoskeletal: Negative for joint swelling and myalgias.  Skin: Negative for rash.  Allergic/Immunologic: Negative.   Neurological: Negative for weakness, light-headedness and numbness.  Psychiatric/Behavioral: Negative for dysphoric mood and suicidal ideas.    Objective:  BP (!) 169/114   Pulse 96   Ht _0  (1.753 m)   Wt (!) 379 lb (171.9 kg)   SpO2 97%   BMI 55.97 kg/m   BP/Weight 02/28/2020 07/02/2019 84/78/4128  Systolic BP 208 138 -  Diastolic BP 871 959 -  Wt. (Lbs) 379 - 368.17  BMI 55.97 - 54.37      Physical Exam Constitutional:      Appearance: He is well-developed. He is obese.  Neck:     Vascular: No JVD.  Cardiovascular:     Rate and Rhythm: Normal rate.     Heart sounds: Normal heart sounds. No murmur heard.   Pulmonary:     Effort: Pulmonary effort is normal.     Breath sounds: Normal breath sounds. No wheezing or rales.  Chest:     Chest wall: No tenderness.  Abdominal:     General: Bowel sounds are normal. There is no distension.     Palpations: Abdomen is soft. There is no mass.     Tenderness: There is no abdominal tenderness.  Musculoskeletal:        General: Normal range of motion.     Right lower leg: No edema.     Left lower leg: No edema.  Neurological:     Mental Status: He is alert and oriented to person, place, and time.  Psychiatric:        Mood and Affect: Mood normal.     CMP Latest Ref Rng & Units 07/02/2019 07/01/2019 06/30/2019  Glucose 70 - 99 mg/dL 271(H) 242(H) 183(H)  BUN 6 - 20 mg/dL 22(H) 22(H) 18  Creatinine 0.61 - 1.24 mg/dL 0.90 0.88 0.89  Sodium 135 - 145 mmol/L 137 137 141  Potassium 3.5 - 5.1 mmol/L  4.3 4.1 4.2  Chloride 98 - 111 mmol/L 109 110 113(H)  CO2 22 - 32 mmol/L 20(L) 19(L) 20(L)  Calcium 8.9 - 10.3 mg/dL 8.7(L) 8.7(L) 9.1  Total Protein 6.5 - 8.1 g/dL 6.3(L) 6.9 7.7  Total Bilirubin 0.3 - 1.2 mg/dL 0.2(L) 0.4 0.3  Alkaline Phos 38 - 126 U/L 46 51 54  AST 15 - 41 U/L 12(L) 12(L) 13(L)  ALT 0 - 44 U/L _1 Lipid Panel     Component Value Date/Time   CHOL 129 06/29/2019 0005   CHOL 137 09/13/2018  1122   TRIG 84 06/29/2019 0005   HDL 37 (L) 06/29/2019 0005   HDL 43 09/13/2018 1122   CHOLHDL 3.5 06/29/2019 0005   VLDL 17 06/29/2019 0005   LDLCALC 75 06/29/2019 0005   LDLCALC 76 09/13/2018 1122    CBC    Component Value Date/Time   WBC 9.3 07/02/2019 0018   RBC 3.94 (L) 07/02/2019 0018   HGB 10.7 (L) 07/02/2019 0018   HCT 33.7 (L) 07/02/2019 0018   PLT 292 07/02/2019 0018   MCV 85.5 07/02/2019 0018   MCH 27.2 07/02/2019 0018   MCHC 31.8 07/02/2019 0018   RDW 14.5 07/02/2019 0018   LYMPHSABS 1.8 07/02/2019 0018   MONOABS 0.8 07/02/2019 0018   EOSABS 0.0 07/02/2019 0018   BASOSABS 0.0 07/02/2019 0018    Lab Results  Component Value Date   HGBA1C 6.4 02/28/2020    Assessment & Plan:  1. Type 2 diabetes mellitus with other specified complication, without long-term current use of insulin (HCC) Controlled Continue current regimen Counseled on Diabetic diet, my plate method, 470 minutes of moderate intensity exercise/week Blood sugar logs with fasting goals of 80-120 mg/dl, random of less than 180 and in the event of sugars less than 60 mg/dl or greater than 400 mg/dl encouraged to notify the clinic. Advised on the need for annual eye exams, annual foot exams, Pneumonia vaccine. - POCT glucose (manual entry) - POCT glycosylated hemoglobin (Hb A1C) - Ambulatory referral to Ophthalmology - Microalbumin / creatinine urine ratio - CMP14+EGFR - LP+Non-HDL Cholesterol - metFORMIN (GLUCOPHAGE) 500 MG tablet; Take 2 tablets (1,000 mg total) by mouth 2  (two) times daily with a meal.  Dispense: 360 tablet; Refill: 1  2. Need for hepatitis C screening test - HCV RNA quant rflx ultra or genotyp([facility])  3. Essential hypertension Uncontrolled due to being off all his antihypertensives He does seem to be on a bunch of antihypertensives-lisinopril, Coreg, amlodipine and clonidine I am concerned that he might become hypotensive on all of this hence I have discontinued clonidine and advised him to keep a check of his blood pressures - lisinopril (ZESTRIL) 40 MG tablet; Take 1 tablet (40 mg total) by mouth daily.  Dispense: 90 tablet; Refill: 1 - amLODipine (NORVASC) 10 MG tablet; Take 1 tablet (10 mg total) by mouth daily.  Dispense: 90 tablet; Refill: 1 - carvedilol (COREG) 25 MG tablet; Take 1 tablet (25 mg total) by mouth 2 (two) times daily.  Dispense: 180 tablet; Refill: 1  4. Morbid obesity ([place]) We have discussed cutting back on portion sizes, avoiding late meals and increasing physical activity I have also mentioned the option of bariatric surgery referral however he would like to work on exercising first.  Return in about 3 months (around 05/30/2020) for Chronic disease management.      Charlott Rakes, MD, FAAFP. [facility], [place]   02/28/2020, 8:49 AM

## 2020-02-28 NOTE — Patient Instructions (Signed)

## 2020-03-01 LAB — CMP14+EGFR
ALT: 23 IU/L (ref 0–44)
AST: 14 IU/L (ref 0–40)
Albumin/Globulin Ratio: 1.4 (ref 1.2–2.2)
Albumin: 4.1 g/dL (ref 4.0–5.0)
Alkaline Phosphatase: 64 IU/L (ref 48–121)
BUN/Creatinine Ratio: 13 (ref 9–20)
BUN: 16 mg/dL (ref 6–20)
Bilirubin Total: <0.2 mg/dL (ref 0.0–1.2)
CO2: 21 mmol/L (ref 20–29)
Calcium: 9.4 mg/dL (ref 8.7–10.2)
Chloride: 106 mmol/L (ref 96–106)
Creatinine, Ser: 1.22 mg/dL (ref 0.76–1.27)
GFR calc Af Amer: 88 mL/min/{1.73_m2} (ref >59)
GFR calc non Af Amer: 76 mL/min/{1.73_m2} (ref >59)
Globulin, Total: 2.9 g/dL (ref 1.5–4.5)
Glucose: 118 mg/dL — ABNORMAL HIGH (ref 65–99)
Potassium: 4.5 mmol/L (ref 3.5–5.2)
Sodium: 141 mmol/L (ref 134–144)
Total Protein: 7 g/dL (ref 6.0–8.5)

## 2020-03-01 LAB — HCV RNA QUANT RFLX ULTRA OR GENOTYP: HCV Quant Baseline: NOT DETECTED IU/mL

## 2020-03-01 LAB — MICROALBUMIN / CREATININE URINE RATIO
Creatinine, Urine: 218 mg/dL
Microalb/Creat Ratio: 29 mg/g creat (ref 0–29)
Microalbumin, Urine: 64.1 ug/mL

## 2020-03-01 LAB — LP+NON-HDL CHOLESTEROL
Cholesterol, Total: 158 mg/dL (ref 100–199)
HDL: 40 mg/dL (ref >39)
LDL Chol Calc (NIH): 94 mg/dL (ref 0–99)
Total Non-HDL-Chol (LDL+VLDL): 118 mg/dL (ref 0–129)
Triglycerides: 135 mg/dL (ref 0–149)
VLDL Cholesterol Cal: 24 mg/dL (ref 5–40)

## 2020-03-28 DIAGNOSIS — Z20822 Contact with and (suspected) exposure to covid-19: Secondary | ICD-10-CM | POA: Diagnosis not present

## 2020-03-28 DIAGNOSIS — Z03818 Encounter for observation for suspected exposure to other biological agents ruled out: Secondary | ICD-10-CM | POA: Diagnosis not present

## 2020-04-22 DIAGNOSIS — Z03818 Encounter for observation for suspected exposure to other biological agents ruled out: Secondary | ICD-10-CM | POA: Diagnosis not present

## 2020-04-22 DIAGNOSIS — Z1152 Encounter for screening for COVID-19: Secondary | ICD-10-CM | POA: Diagnosis not present

## 2020-04-24 ENCOUNTER — Other Ambulatory Visit: Payer: Self-pay | Admitting: Family Medicine

## 2020-04-24 ENCOUNTER — Encounter: Payer: Self-pay | Admitting: Family Medicine

## 2020-04-24 DIAGNOSIS — E1169 Type 2 diabetes mellitus with other specified complication: Secondary | ICD-10-CM

## 2020-04-24 MED ORDER — METFORMIN HCL 500 MG PO TABS: 1000.0000 mg | ORAL_TABLET | Freq: Two times a day (BID) | ORAL | 1 refills | Status: DC

## 2020-05-30 ENCOUNTER — Encounter: Payer: Self-pay | Admitting: Family Medicine

## 2020-05-30 ENCOUNTER — Ambulatory Visit: Payer: BC Managed Care – PPO | Attending: Family Medicine | Admitting: Family Medicine

## 2020-05-30 ENCOUNTER — Other Ambulatory Visit: Payer: Self-pay

## 2020-05-30 VITALS — BP 169/116 | HR 87 | Temp 98.6°F | Ht 69.0 in | Wt 386.0 lb

## 2020-05-30 DIAGNOSIS — E1169 Type 2 diabetes mellitus with other specified complication: Secondary | ICD-10-CM

## 2020-05-30 DIAGNOSIS — E1159 Type 2 diabetes mellitus with other circulatory complications: Secondary | ICD-10-CM | POA: Diagnosis not present

## 2020-05-30 DIAGNOSIS — B353 Tinea pedis: Secondary | ICD-10-CM | POA: Diagnosis not present

## 2020-05-30 DIAGNOSIS — Z23 Encounter for immunization: Secondary | ICD-10-CM

## 2020-05-30 DIAGNOSIS — I10 Essential (primary) hypertension: Secondary | ICD-10-CM

## 2020-05-30 DIAGNOSIS — I152 Hypertension secondary to endocrine disorders: Secondary | ICD-10-CM

## 2020-05-30 LAB — POCT GLYCOSYLATED HEMOGLOBIN (HGB A1C): HbA1c, POC (controlled diabetic range): 6.6 % (ref 0.0–7.0)

## 2020-05-30 LAB — GLUCOSE, POCT (MANUAL RESULT ENTRY): POC Glucose: 149 mg/dl — AB (ref 70–99)

## 2020-05-30 MED ORDER — AMLODIPINE BESYLATE 10 MG PO TABS: 10.0000 mg | ORAL_TABLET | Freq: Every day | ORAL | 1 refills | Status: DC

## 2020-05-30 MED ORDER — TERBINAFINE HCL 1 % EX CREA: 1.0000 "application " | TOPICAL_CREAM | Freq: Two times a day (BID) | CUTANEOUS | 0 refills | Status: DC

## 2020-05-30 MED ORDER — LISINOPRIL 40 MG PO TABS: 40.0000 mg | ORAL_TABLET | Freq: Every day | ORAL | 1 refills | Status: DC

## 2020-05-30 MED ORDER — ATORVASTATIN CALCIUM 40 MG PO TABS: 40.0000 mg | ORAL_TABLET | Freq: Every day | ORAL | 1 refills | Status: DC

## 2020-05-30 MED ORDER — BASAGLAR KWIKPEN 100 UNIT/ML ~~LOC~~ SOPN: 8.0000 [IU] | PEN_INJECTOR | Freq: Every day | SUBCUTANEOUS | 1 refills | Status: DC

## 2020-05-30 MED ORDER — CARVEDILOL 25 MG PO TABS: 25.0000 mg | ORAL_TABLET | Freq: Two times a day (BID) | ORAL | 1 refills | Status: DC

## 2020-05-30 MED ORDER — METFORMIN HCL 500 MG PO TABS: 1000.0000 mg | ORAL_TABLET | Freq: Two times a day (BID) | ORAL | 1 refills | Status: DC

## 2020-05-30 NOTE — Progress Notes (Signed)
Subjective:  Patient ID: William Moss, male    DOB: 25-Jul-1986  Age: 34 y.o. MRN: [id number]  CC: Diabetes   HPI William Moss is a 34 year old male with a history of hypertension, type 2 diabetes mellitus(A1c 6.6), morbid obesity, GERD hospitalized for COVID-19 pneumonia in 06/2019. He is doing well on Metformin for his diabetes mellitus and denies presence of neuropathy, visual concerns or hypoglycemia. His blood pressure is significantly elevated and he has been out of all his antihypertensive due to a problem with his insurance company. He is looking towards a referral to bariatric surgery to discuss weight loss options. He has no additional concerns today.   Past Medical History:  Diagnosis Date  . Atypical chest pain 05/27/2016   a. 05/2016: NST showing small area of moderare anteroapical ischemia b. 05/2016: cath showing tortous but normal cors which confirmed a false positive NST.  . Diabetes mellitus without complication (HCC)   . GERD (gastroesophageal reflux disease) 10/02/2016  . Hypertension     Past Surgical History:  Procedure Laterality Date  . CARDIAC CATHETERIZATION N/A 06/10/2016   Procedure: Left Heart Cath and Coronary Angiography;  Surgeon: Iran Ouch, MD;  Location: [facility];  Service: Cardiovascular;  Laterality: N/A;  . COLONOSCOPY N/A 02/04/2016   Procedure: COLONOSCOPY;  Surgeon: Hilarie Fredrickson, MD;  Location: [facility];  Service: Endoscopy;  Laterality: N/A;  . NO PAST SURGERIES      Family History  Problem Relation Age of Onset  . Diabetes Mother   . Diabetes Father   . Cancer Maternal Grandmother   . Breast cancer Cousin   . Alzheimer's disease Paternal Grandmother     No Known Allergies  Outpatient Medications Prior to Visit  Medication Sig Dispense Refill  . metFORMIN (GLUCOPHAGE) 500 MG tablet Take 2 tablets (1,000 mg total) by mouth 2 (two) times daily with a meal. 360 tablet 1  . Blood Glucose Monitoring Suppl (TRUE  METRIX METER) DEVI 1 each by Does not apply route 3 (three) times daily before meals. (Patient not taking: Reported on 05/30/2020) 1 Device 0  . glucose blood (TRUE METRIX BLOOD GLUCOSE TEST) test strip 3 times daily before meals (Patient not taking: Reported on 05/30/2020) 100 each 12  . Insulin Pen Needle (TRUEPLUS PEN NEEDLES) 32G X 4 MM MISC Use as instructed with Victoza. (Patient not taking: Reported on 05/30/2020) 100 each 11  . Insulin Pen Needle 29G X MISC 10 Units by Does not apply route daily. (Patient not taking: Reported on 05/30/2020) 100 each 0  . Ipratropium-Albuterol (COMBIVENT) 20-100 MCG/ACT AERS respimat Inhale 1 puff into the lungs 4 (four) times daily. (Patient not taking: Reported on 05/30/2020) 4 g 0  . TRUEplus Lancets 28G MISC 1 each by Does not apply route 3 (three) times daily before meals. (Patient not taking: Reported on 05/30/2020) 100 each 12  . amLODipine (NORVASC) 10 MG tablet Take 1 tablet (10 mg total) by mouth daily. 90 tablet 1  . atorvastatin (LIPITOR) 40 MG tablet Take 1 tablet (40 mg total) by mouth daily. (Patient not taking: Reported on 05/30/2020) 90 tablet 1  . carvedilol (COREG) 25 MG tablet Take 1 tablet (25 mg total) by mouth 2 (two) times daily. (Patient not taking: Reported on 05/30/2020) 180 tablet 1  . Insulin Glargine (BASAGLAR KWIKPEN) 100 UNIT/ML Inject 0.08 mLs (8 Units total) into the skin daily. (Patient not taking: Reported on 05/30/2020) 30 mL 1  . lisinopril (ZESTRIL) 40 MG tablet  Take 1 tablet (40 mg total) by mouth daily. (Patient not taking: Reported on 05/30/2020) 90 tablet 1   No facility-administered medications prior to visit.     ROS Review of Systems  Constitutional: Negative for activity change and appetite change.  HENT: Negative for sinus pressure and sore throat.   Eyes: Negative for visual disturbance.  Respiratory: Negative for cough, chest tightness and shortness of breath.   Cardiovascular: Negative for chest pain and leg  swelling.  Gastrointestinal: Negative for abdominal distention, abdominal pain, constipation and diarrhea.  Endocrine: Negative.   Genitourinary: Negative for dysuria.  Musculoskeletal: Negative for joint swelling and myalgias.  Skin: Negative for rash.  Allergic/Immunologic: Negative.   Neurological: Negative for weakness, light-headedness and numbness.  Psychiatric/Behavioral: Negative for dysphoric mood and suicidal ideas.    Objective:  BP (!) 169/116   Pulse 87   Temp 98.6 F (37 C) (Oral)   Ht 5\' 9"  (1.753 m)   Wt (!) 386 lb (175.1 kg)   SpO2 97%   BMI 57.00 kg/m   BP/Weight 05/30/2020 02/28/2020 07/02/2019  Systolic BP 169 169 171  Diastolic BP 116 114 114  Wt. (Lbs) 386 379 -  BMI 57 55.97 -      Physical Exam Constitutional:      Appearance: He is well-developed.  Neck:     Vascular: No JVD.  Cardiovascular:     Rate and Rhythm: Normal rate.     Heart sounds: Normal heart sounds. No murmur heard.   Pulmonary:     Effort: Pulmonary effort is normal.     Breath sounds: Normal breath sounds. No wheezing or rales.  Chest:     Chest wall: No tenderness.  Abdominal:     General: Bowel sounds are normal. There is no distension.     Palpations: Abdomen is soft. There is no mass.     Tenderness: There is no abdominal tenderness.  Musculoskeletal:        General: Normal range of motion.     Right lower leg: No edema.     Left lower leg: No edema.  Neurological:     Mental Status: He is alert and oriented to person, place, and time.  Psychiatric:        Mood and Affect: Mood normal.    Diabetic Foot Exam - Simple   Simple Foot Form Visual Inspection No deformities, no ulcerations, no other skin breakdown bilaterally: Yes See comments: Yes Sensation Testing Intact to touch and monofilament testing bilaterally: Yes Pulse Check Posterior Tibialis and Dorsalis pulse intact bilaterally: Yes Comments Tinea pedis in right fourth webspace      CMP Latest  Ref Rng & Units 02/28/2020 07/02/2019 07/01/2019  Glucose 65 - 99 mg/dL 161(W118(H) 960(A271(H) 540(J242(H)  BUN 6 - 20 mg/dL 16 81(X22(H) 91(Y22(H)  Creatinine 0.76 - 1.27 mg/dL 7.821.22 9.560.90 2.130.88  Sodium 134 - 144 mmol/L 141 137 137  Potassium 3.5 - 5.2 mmol/L 4.5 4.3 4.1  Chloride 96 - 106 mmol/L 106 109 110  CO2 20 - 29 mmol/L 21 20(L) 19(L)  Calcium 8.7 - 10.2 mg/dL 9.4 0.8(M8.7(L) 5.7(Q8.7(L)  Total Protein 6.0 - 8.5 g/dL 7.0 4.6(N6.3(L) 6.9  Total Bilirubin 0.0 - 1.2 mg/dL <6.2<0.2 9.5(M0.2(L) 0.4  Alkaline Phos 48 - 121 IU/L 64 46 51  AST 0 - 40 IU/L 14 12(L) 12(L)  ALT 0 - 44 IU/L 23 25 23     Lipid Panel     Component Value Date/Time   CHOL 158 02/28/2020  0910   TRIG 135 02/28/2020 0910   HDL 40 02/28/2020 0910   CHOLHDL 3.5 06/29/2019 0005   VLDL 17 06/29/2019 0005   LDLCALC 94 02/28/2020 0910    CBC    Component Value Date/Time   WBC 9.3 07/02/2019 0018   RBC 3.94 (L) 07/02/2019 0018   HGB 10.7 (L) 07/02/2019 0018   HCT 33.7 (L) 07/02/2019 0018   PLT 292 07/02/2019 0018   MCV 85.5 07/02/2019 0018   MCH 27.2 07/02/2019 0018   MCHC 31.8 07/02/2019 0018   RDW 14.5 07/02/2019 0018   LYMPHSABS 1.8 07/02/2019 0018   MONOABS 0.8 07/02/2019 0018   EOSABS 0.0 07/02/2019 0018   BASOSABS 0.0 07/02/2019 0018    Lab Results  Component Value Date   HGBA1C 6.6 05/30/2020    Assessment & Plan:  1. Type 2 diabetes mellitus with other specified complication, without long-term current use of insulin (HCC) Controlled with A1c of 6.6 Continue current regimen Counseled on Diabetic diet, my plate method, 423 minutes of moderate intensity exercise/week Blood sugar logs with fasting goals of 80-120 mg/dl, random of less than 536 and in the event of sugars less than 60 mg/dl or greater than 144 mg/dl encouraged to notify the clinic. Advised on the need for annual eye exams, annual foot exams, Pneumonia vaccine. - POCT glucose (manual entry) - POCT glycosylated hemoglobin (Hb A1C) - atorvastatin (LIPITOR) 40 MG tablet; Take 1  tablet (40 mg total) by mouth daily.  Dispense: 90 tablet; Refill: 1 - Insulin Glargine (BASAGLAR KWIKPEN) 100 UNIT/ML; Inject 8 Units into the skin daily.  Dispense: 30 mL; Refill: 1 - metFORMIN (GLUCOPHAGE) 500 MG tablet; Take 2 tablets (1,000 mg total) by mouth 2 (two) times daily with a meal.  Dispense: 360 tablet; Refill: 1  2. Morbid obesity (HCC) - Amb Referral to Bariatric Surgery  3. Hypertension associated with diabetes (HCC) Uncontrolled due to noncompliance Refilled medications and emphasized the need to be compliant Counseled on blood pressure goal of less than 130/80, low-sodium, DASH diet, medication compliance, 150 minutes of moderate intensity exercise per week. Discussed medication compliance, adverse effects. - amLODipine (NORVASC) 10 MG tablet; Take 1 tablet (10 mg total) by mouth daily.  Dispense: 90 tablet; Refill: 1 - carvedilol (COREG) 25 MG tablet; Take 1 tablet (25 mg total) by mouth 2 (two) times daily.  Dispense: 180 tablet; Refill: 1 - lisinopril (ZESTRIL) 40 MG tablet; Take 1 tablet (40 mg total) by mouth daily.  Dispense: 90 tablet; Refill: 1  4. Tinea pedis of right foot Discussed foot care - terbinafine (LAMISIL AT) 1 % cream; Apply 1 application topically 2 (two) times daily.  Dispense: 30 g; Refill: 0   Meds ordered this encounter  Medications  . amLODipine (NORVASC) 10 MG tablet    Sig: Take 1 tablet (10 mg total) by mouth daily.    Dispense:  90 tablet    Refill:  1  . atorvastatin (LIPITOR) 40 MG tablet    Sig: Take 1 tablet (40 mg total) by mouth daily.    Dispense:  90 tablet    Refill:  1  . carvedilol (COREG) 25 MG tablet    Sig: Take 1 tablet (25 mg total) by mouth 2 (two) times daily.    Dispense:  180 tablet    Refill:  1  . Insulin Glargine (BASAGLAR KWIKPEN) 100 UNIT/ML    Sig: Inject 8 Units into the skin daily.    Dispense:  30 mL  Refill:  1  . lisinopril (ZESTRIL) 40 MG tablet    Sig: Take 1 tablet (40 mg total) by mouth  daily.    Dispense:  90 tablet    Refill:  1  . metFORMIN (GLUCOPHAGE) 500 MG tablet    Sig: Take 2 tablets (1,000 mg total) by mouth 2 (two) times daily with a meal.    Dispense:  360 tablet    Refill:  1  . terbinafine (LAMISIL AT) 1 % cream    Sig: Apply 1 application topically 2 (two) times daily.    Dispense:  30 g    Refill:  0    Follow-up: Return in about 1 month (around 06/29/2020) for folllow up of blood presssure.       Hoy Register, MD, FAAFP. [facility] [place], [place] [phone]   05/30/2020, 10:09 AM

## 2020-05-30 NOTE — Progress Notes (Signed)
States that he has only been taking his metformin for the last couple of months.

## 2020-05-30 NOTE — Patient Instructions (Signed)
Athlete's Foot  Athlete's foot (tinea pedis) is a fungal infection of the skin on your feet. It often occurs on the skin that is between or underneath your toes. It can also occur on the soles of your feet. Symptoms include itchy or white and flaky areas on the skin. The infection can spread from person to person (is contagious). It can also spread when a person's bare feet come in contact with the fungus on shower floors or on items such as shoes. Follow these instructions at home: Medicines  Apply or take over-the-counter and prescription medicines only as told by your doctor.  Apply your antifungal medicine as told by your doctor. Do not stop using the medicine even if your feet start to get better. Foot care  Do not scratch your feet.  Keep your feet dry: ? Wear cotton or wool socks. Change your socks every day or if they become wet. ? Wear shoes that allow air to move around, such as sandals or canvas tennis shoes.  Wash and dry your feet: ? Every day or as told by your doctor. ? After exercising. ? Including the area between your toes. General instructions  Do not share any of these items that touch your feet: ? Towels. ? Shoes. ? Nail clippers. ? Other personal items.  Protect your feet by wearing sandals in wet areas, such as locker rooms and shared showers.  Keep all follow-up visits as told by your doctor. This is important.  If you have diabetes, keep your blood sugar under control. Contact a doctor if:  You have a fever.  You have swelling, pain, warmth, or redness in your foot.  Your feet are not getting better with treatment.  Your symptoms get worse.  You have new symptoms. Summary  Athlete's foot is a fungal infection of the skin on your feet.  Symptoms include itchy or white and flaky areas on the skin.  Apply your antifungal medicine as told by your doctor.  Keep your feet clean and dry. This information is not intended to replace advice given  to you by your health care provider. Make sure you discuss any questions you have with your health care provider. Document Revised: 08/12/2017 Document Reviewed: 06/07/2017 Elsevier Patient Education  2020 Elsevier Inc.  

## 2020-06-04 DIAGNOSIS — Z03818 Encounter for observation for suspected exposure to other biological agents ruled out: Secondary | ICD-10-CM | POA: Diagnosis not present

## 2020-06-04 DIAGNOSIS — Z20822 Contact with and (suspected) exposure to covid-19: Secondary | ICD-10-CM | POA: Diagnosis not present

## 2020-06-24 DIAGNOSIS — Z03818 Encounter for observation for suspected exposure to other biological agents ruled out: Secondary | ICD-10-CM | POA: Diagnosis not present

## 2020-06-24 DIAGNOSIS — Z20822 Contact with and (suspected) exposure to covid-19: Secondary | ICD-10-CM | POA: Diagnosis not present

## 2020-06-25 ENCOUNTER — Encounter: Payer: Self-pay | Admitting: Family Medicine

## 2020-07-08 ENCOUNTER — Other Ambulatory Visit: Payer: Self-pay

## 2020-07-08 ENCOUNTER — Ambulatory Visit: Payer: BC Managed Care – PPO | Attending: Family Medicine | Admitting: Family Medicine

## 2020-07-08 ENCOUNTER — Encounter: Payer: Self-pay | Admitting: Family Medicine

## 2020-07-08 VITALS — BP 175/83 | HR 77 | Temp 99.3°F | Ht 69.0 in | Wt 385.0 lb

## 2020-07-08 DIAGNOSIS — I152 Hypertension secondary to endocrine disorders: Secondary | ICD-10-CM | POA: Diagnosis not present

## 2020-07-08 DIAGNOSIS — E1159 Type 2 diabetes mellitus with other circulatory complications: Secondary | ICD-10-CM | POA: Diagnosis not present

## 2020-07-08 NOTE — Progress Notes (Signed)
Subjective:  Patient ID: William Moss, male    DOB: 1986/01/08  Age: 34 y.o. MRN: [id number]  CC: Hypertension   HPI William Moss  is a 34 year old male with a history of hypertension, type 2 diabetes mellitus(A1c 6.6), morbid obesity, GERD hospitalized for COVID-19 pneumonia in 06/2019. He was seen for chronic disease management at his last visit and his blood pressure was elevated and he had endorsed running out of his medications which were refilled. BP is elevated and he is yet to take his antihypertensive today as he was in a hurry to get to this appointment. He has no concerns today.  Past Medical History:  Diagnosis Date  . Atypical chest pain 05/27/2016   a. 05/2016: NST showing small area of moderare anteroapical ischemia b. 05/2016: cath showing tortous but normal cors which confirmed a false positive NST.  . Diabetes mellitus without complication (HCC)   . GERD (gastroesophageal reflux disease) 10/02/2016  . Hypertension     Past Surgical History:  Procedure Laterality Date  . CARDIAC CATHETERIZATION N/A 06/10/2016   Procedure: Left Heart Cath and Coronary Angiography;  Surgeon: Iran Ouch, MD;  Location: [facility];  Service: Cardiovascular;  Laterality: N/A;  . COLONOSCOPY N/A 02/04/2016   Procedure: COLONOSCOPY;  Surgeon: Hilarie Fredrickson, MD;  Location: [facility];  Service: Endoscopy;  Laterality: N/A;  . NO PAST SURGERIES      Family History  Problem Relation Age of Onset  . Diabetes Mother   . Diabetes Father   . Cancer Maternal Grandmother   . Breast cancer Cousin   . Alzheimer's disease Paternal Grandmother     No Known Allergies  Outpatient Medications Prior to Visit  Medication Sig Dispense Refill  . amLODipine (NORVASC) 10 MG tablet Take 1 tablet (10 mg total) by mouth daily. 90 tablet 1  . atorvastatin (LIPITOR) 40 MG tablet Take 1 tablet (40 mg total) by mouth daily. 90 tablet 1  . carvedilol (COREG) 25 MG tablet Take 1 tablet (25 mg  total) by mouth 2 (two) times daily. 180 tablet 1  . Insulin Pen Needle 29G X MISC 10 Units by Does not apply route daily. 100 each 0  . lisinopril (ZESTRIL) 40 MG tablet Take 1 tablet (40 mg total) by mouth daily. 90 tablet 1  . metFORMIN (GLUCOPHAGE) 500 MG tablet Take 2 tablets (1,000 mg total) by mouth 2 (two) times daily with a meal. 360 tablet 1  . terbinafine (LAMISIL AT) 1 % cream Apply 1 application topically 2 (two) times daily. 30 g 0  . Blood Glucose Monitoring Suppl (TRUE METRIX METER) DEVI 1 each by Does not apply route 3 (three) times daily before meals. (Patient not taking: Reported on 05/30/2020) 1 Device 0  . glucose blood (TRUE METRIX BLOOD GLUCOSE TEST) test strip 3 times daily before meals (Patient not taking: Reported on 05/30/2020) 100 each 12  . Insulin Glargine (BASAGLAR KWIKPEN) 100 UNIT/ML Inject 8 Units into the skin daily. 30 mL 1  . Insulin Pen Needle (TRUEPLUS PEN NEEDLES) 32G X 4 MM MISC Use as instructed with Victoza. (Patient not taking: Reported on 05/30/2020) 100 each 11  . Ipratropium-Albuterol (COMBIVENT) 20-100 MCG/ACT AERS respimat Inhale 1 puff into the lungs 4 (four) times daily. (Patient not taking: Reported on 05/30/2020) 4 g 0  . TRUEplus Lancets 28G MISC 1 each by Does not apply route 3 (three) times daily before meals. (Patient not taking: Reported on 05/30/2020) 100 each 12  No facility-administered medications prior to visit.     ROS Review of Systems  Constitutional: Negative for activity change and appetite change.  HENT: Negative for sinus pressure and sore throat.   Eyes: Negative for visual disturbance.  Respiratory: Negative for cough, chest tightness and shortness of breath.   Cardiovascular: Negative for chest pain and leg swelling.  Gastrointestinal: Negative for abdominal distention, abdominal pain, constipation and diarrhea.  Endocrine: Negative.   Genitourinary: Negative for dysuria.  Musculoskeletal: Negative for joint swelling  and myalgias.  Skin: Negative for rash.  Allergic/Immunologic: Negative.   Neurological: Negative for weakness, light-headedness and numbness.  Psychiatric/Behavioral: Negative for dysphoric mood and suicidal ideas.    Objective:  BP (!) 175/83   Pulse 77   Temp 99.3 F (37.4 C) (Oral)   Ht 5\' 9"  (1.753 m)   Wt (!) 385 lb (174.6 kg)   SpO2 96%   BMI 56.85 kg/m   BP/Weight 07/08/2020 05/30/2020 02/28/2020  Systolic BP 175 169 169  Diastolic BP 83 116 114  Wt. (Lbs) 385 386 379  BMI 56.85 57 55.97      Physical Exam Constitutional:      Appearance: He is well-developed. He is obese.  Neck:     Vascular: No JVD.  Cardiovascular:     Rate and Rhythm: Normal rate.     Heart sounds: Normal heart sounds. No murmur heard.   Pulmonary:     Effort: Pulmonary effort is normal.     Breath sounds: Normal breath sounds. No wheezing or rales.  Chest:     Chest wall: No tenderness.  Abdominal:     General: Bowel sounds are normal. There is no distension.     Palpations: Abdomen is soft. There is no mass.     Tenderness: There is no abdominal tenderness.  Musculoskeletal:        General: Normal range of motion.     Right lower leg: No edema.     Left lower leg: No edema.  Neurological:     Mental Status: He is alert and oriented to person, place, and time.  Psychiatric:        Mood and Affect: Mood normal.     CMP Latest Ref Rng & Units 02/28/2020 07/02/2019 07/01/2019  Glucose 65 - 99 mg/dL 07/03/2019) 127(N) 170(Y)  BUN 6 - 20 mg/dL 16 174(B) 44(H)  Creatinine 0.76 - 1.27 mg/dL 67(R 9.16 3.84  Sodium 134 - 144 mmol/L 141 137 137  Potassium 3.5 - 5.2 mmol/L 4.5 4.3 4.1  Chloride 96 - 106 mmol/L 106 109 110  CO2 20 - 29 mmol/L 21 20(L) 19(L)  Calcium 8.7 - 10.2 mg/dL 9.4 6.65) 9.9(J)  Total Protein 6.0 - 8.5 g/dL 7.0 5.7(S) 6.9  Total Bilirubin 0.0 - 1.2 mg/dL 1.7(B <9.3) 0.4  Alkaline Phos 48 - 121 IU/L 64 46 51  AST 0 - 40 IU/L 14 12(L) 12(L)  ALT 0 - 44 IU/L 23 25 23      Lipid Panel     Component Value Date/Time   CHOL 158 02/28/2020 0910   TRIG 135 02/28/2020 0910   HDL 40 02/28/2020 0910   CHOLHDL 3.5 06/29/2019 0005   VLDL 17 06/29/2019 0005   LDLCALC 94 02/28/2020 0910    CBC    Component Value Date/Time   WBC 9.3 07/02/2019 0018   RBC 3.94 (L) 07/02/2019 0018   HGB 10.7 (L) 07/02/2019 0018   HCT 33.7 (L) 07/02/2019 0018   PLT 292 07/02/2019  0018   MCV 85.5 07/02/2019 0018   MCH 27.2 07/02/2019 0018   MCHC 31.8 07/02/2019 0018   RDW 14.5 07/02/2019 0018   LYMPHSABS 1.8 07/02/2019 0018   MONOABS 0.8 07/02/2019 0018   EOSABS 0.0 07/02/2019 0018   BASOSABS 0.0 07/02/2019 0018    Lab Results  Component Value Date   HGBA1C 6.6 05/30/2020    Assessment & Plan:  1. Hypertension associated with diabetes (HCC) Uncontrolled Advised that he needs to take his antihypertensive prior to his appointments. I will have him follow-up with the clinical pharmacist to assess his blood pressure and if at goal consider switching from lisinopril 40 mg to lisinopril/HCTZ 20/12.5 mg 2 tablets once daily. Continue amlodipine, Coreg Counseled on blood pressure goal of less than 130/80, low-sodium, DASH diet, medication compliance, 150 minutes of moderate intensity exercise per week. Discussed medication compliance, adverse effects.   No orders of the defined types were placed in this encounter.   Follow-up: Return in about 2 weeks (around 07/22/2020) for BP check with Franky Macho; 3 months PCP.       Hoy Register, MD, FAAFP. [facility] [place], [place] [phone]   07/08/2020, 10:26 AM

## 2020-07-08 NOTE — Patient Instructions (Signed)
DASH Eating Plan DASH stands for "Dietary Approaches to Stop Hypertension." The DASH eating plan is a healthy eating plan that has been shown to reduce high blood pressure (hypertension). It may also reduce your risk for type 2 diabetes, heart disease, and stroke. The DASH eating plan may also help with weight loss. What are tips for following this plan?  General guidelines  Avoid eating more than 2,300 mg (milligrams) of salt (sodium) a day. If you have hypertension, you may need to reduce your sodium intake to 1,500 mg a day.  Limit alcohol intake to no more than 1 drink a day for nonpregnant women and 2 drinks a day for men. One drink equals 12 oz of beer, 5 oz of wine, or 1 oz of hard liquor.  Work with your health care provider to maintain a healthy body weight or to lose weight. Ask what an ideal weight is for you.  Get at least 30 minutes of exercise that causes your heart to beat faster (aerobic exercise) most days of the week. Activities may include walking, swimming, or biking.  Work with your health care provider or diet and nutrition specialist (dietitian) to adjust your eating plan to your individual calorie needs. Reading food labels   Check food labels for the amount of sodium per serving. Choose foods with less than 5 percent of the Daily Value of sodium. Generally, foods with less than 300 mg of sodium per serving fit into this eating plan.  To find whole grains, look for the word "whole" as the first word in the ingredient list. Shopping  Buy products labeled as "low-sodium" or "no salt added."  Buy fresh foods. Avoid canned foods and premade or frozen meals. Cooking  Avoid adding salt when cooking. Use salt-free seasonings or herbs instead of table salt or sea salt. Check with your health care provider or pharmacist before using salt substitutes.  Do not fry foods. Cook foods using healthy methods such as baking, boiling, grilling, and broiling instead.  Cook with  heart-healthy oils, such as olive, canola, soybean, or sunflower oil. Meal planning  Eat a balanced diet that includes: ? 5 or more servings of fruits and vegetables each day. At each meal, try to fill half of your plate with fruits and vegetables. ? Up to 6-8 servings of whole grains each day. ? Less than 6 oz of lean meat, poultry, or fish each day. A 3-oz serving of meat is about the same size as a deck of cards. One egg equals 1 oz. ? 2 servings of low-fat dairy each day. ? A serving of nuts, seeds, or beans 5 times each week. ? Heart-healthy fats. Healthy fats called Omega-3 fatty acids are found in foods such as flaxseeds and coldwater fish, like sardines, salmon, and mackerel.  Limit how much you eat of the following: ? Canned or prepackaged foods. ? Food that is high in trans fat, such as fried foods. ? Food that is high in saturated fat, such as fatty meat. ? Sweets, desserts, sugary drinks, and other foods with added sugar. ? Full-fat dairy products.  Do not salt foods before eating.  Try to eat at least 2 vegetarian meals each week.  Eat more home-cooked food and less restaurant, buffet, and fast food.  When eating at a restaurant, ask that your food be prepared with less salt or no salt, if possible. What foods are recommended? The items listed may not be a complete list. Talk with your dietitian about   what dietary choices are best for you. Grains Whole-grain or whole-wheat bread. Whole-grain or whole-wheat pasta. Brown rice. Oatmeal. Quinoa. Bulgur. Whole-grain and low-sodium cereals. Pita bread. Low-fat, low-sodium crackers. Whole-wheat flour tortillas. Vegetables Fresh or frozen vegetables (raw, steamed, roasted, or grilled). Low-sodium or reduced-sodium tomato and vegetable juice. Low-sodium or reduced-sodium tomato sauce and tomato paste. Low-sodium or reduced-sodium canned vegetables. Fruits All fresh, dried, or frozen fruit. Canned fruit in natural juice (without  added sugar). Meat and other protein foods Skinless chicken or turkey. Ground chicken or turkey. Pork with fat trimmed off. Fish and seafood. Egg whites. Dried beans, peas, or lentils. Unsalted nuts, nut butters, and seeds. Unsalted canned beans. Lean cuts of beef with fat trimmed off. Low-sodium, lean deli meat. Dairy Low-fat (1%) or fat-free (skim) milk. Fat-free, low-fat, or reduced-fat cheeses. Nonfat, low-sodium ricotta or cottage cheese. Low-fat or nonfat yogurt. Low-fat, low-sodium cheese. Fats and oils Soft margarine without trans fats. Vegetable oil. Low-fat, reduced-fat, or light mayonnaise and salad dressings (reduced-sodium). Canola, safflower, olive, soybean, and sunflower oils. Avocado. Seasoning and other foods Herbs. Spices. Seasoning mixes without salt. Unsalted popcorn and pretzels. Fat-free sweets. What foods are not recommended? The items listed may not be a complete list. Talk with your dietitian about what dietary choices are best for you. Grains Baked goods made with fat, such as croissants, muffins, or some breads. Dry pasta or rice meal packs. Vegetables Creamed or fried vegetables. Vegetables in a cheese sauce. Regular canned vegetables (not low-sodium or reduced-sodium). Regular canned tomato sauce and paste (not low-sodium or reduced-sodium). Regular tomato and vegetable juice (not low-sodium or reduced-sodium). Pickles. Olives. Fruits Canned fruit in a light or heavy syrup. Fried fruit. Fruit in cream or butter sauce. Meat and other protein foods Fatty cuts of meat. Ribs. Fried meat. Bacon. Sausage. Bologna and other processed lunch meats. Salami. Fatback. Hotdogs. Bratwurst. Salted nuts and seeds. Canned beans with added salt. Canned or smoked fish. Whole eggs or egg yolks. Chicken or turkey with skin. Dairy Whole or 2% milk, cream, and half-and-half. Whole or full-fat cream cheese. Whole-fat or sweetened yogurt. Full-fat cheese. Nondairy creamers. Whipped toppings.  Processed cheese and cheese spreads. Fats and oils Butter. Stick margarine. Lard. Shortening. Ghee. Bacon fat. Tropical oils, such as coconut, palm kernel, or palm oil. Seasoning and other foods Salted popcorn and pretzels. Onion salt, garlic salt, seasoned salt, table salt, and sea salt. Worcestershire sauce. Tartar sauce. Barbecue sauce. Teriyaki sauce. Soy sauce, including reduced-sodium. Steak sauce. Canned and packaged gravies. Fish sauce. Oyster sauce. Cocktail sauce. Horseradish that you find on the shelf. Ketchup. Mustard. Meat flavorings and tenderizers. Bouillon cubes. Hot sauce and Tabasco sauce. Premade or packaged marinades. Premade or packaged taco seasonings. Relishes. Regular salad dressings. Where to find more information:  National Heart, Lung, and Blood Institute: www.nhlbi.nih.gov  American Heart Association: www.heart.org Summary  The DASH eating plan is a healthy eating plan that has been shown to reduce high blood pressure (hypertension). It may also reduce your risk for type 2 diabetes, heart disease, and stroke.  With the DASH eating plan, you should limit salt (sodium) intake to 2,300 mg a day. If you have hypertension, you may need to reduce your sodium intake to 1,500 mg a day.  When on the DASH eating plan, aim to eat more fresh fruits and vegetables, whole grains, lean proteins, low-fat dairy, and heart-healthy fats.  Work with your health care provider or diet and nutrition specialist (dietitian) to adjust your eating plan to your   individual calorie needs. This information is not intended to replace advice given to you by your health care provider. Make sure you discuss any questions you have with your health care provider. Document Revised: 07/30/2017 Document Reviewed: 08/10/2016 Elsevier Patient Education  2020 Elsevier Inc.  

## 2020-07-22 ENCOUNTER — Encounter: Payer: Self-pay | Admitting: Pharmacist

## 2020-07-22 ENCOUNTER — Ambulatory Visit: Payer: BC Managed Care – PPO | Attending: Family Medicine | Admitting: Pharmacist

## 2020-07-22 ENCOUNTER — Other Ambulatory Visit: Payer: Self-pay

## 2020-07-22 VITALS — BP 132/91 | HR 81

## 2020-07-22 DIAGNOSIS — I152 Hypertension secondary to endocrine disorders: Secondary | ICD-10-CM

## 2020-07-22 DIAGNOSIS — E1159 Type 2 diabetes mellitus with other circulatory complications: Secondary | ICD-10-CM | POA: Diagnosis not present

## 2020-07-22 MED ORDER — LISINOPRIL-HYDROCHLOROTHIAZIDE 20-12.5 MG PO TABS: 2.0000 | ORAL_TABLET | Freq: Every day | ORAL | 1 refills | Status: DC

## 2020-07-22 NOTE — Progress Notes (Signed)
   S:    PCP: Dr. Alvis Lemmings  Patient arrives in good spirits. Presents to the clinic for hypertension evaluation, counseling, and management.  Patient was referred and last seen by Primary Care Provider on 07/08/2020. BP at that visit was elevated. Of note, no medication changes were made as pt had not taken his antihypertensives before that visit.   Today, pt denies chest pains, dyspnea, HA, and blurred vision. Medication adherence reported. He has taken his medications this morning.   Current BP Medications include:  Amlodipine 10 mg daily, lisinopril 40 mg daily   Dietary habits include: endorses compliance with sodium restriction; denies excessive caffeine intake Exercise habits include: none; he knows he needs to get back into exercising  Family / Social history:  - FHx: DM - Tobacco: never smoker  - Alcohol: denies use   O:  Vitals:   07/22/20 0908  BP: (!) 132/91  Pulse: 81   Home BP readings: none  Last 3 Office BP readings: BP Readings from Last 3 Encounters:  07/22/20 (!) 132/91  07/08/20 (!) 175/83  05/30/20 (!) 169/116    BMET    Component Value Date/Time   NA 141 02/28/2020 0910   K 4.5 02/28/2020 0910   CL 106 02/28/2020 0910   CO2 21 02/28/2020 0910   GLUCOSE 118 (H) 02/28/2020 0910   GLUCOSE 271 (H) 07/02/2019 0018   BUN 16 02/28/2020 0910   CREATININE 1.22 02/28/2020 0910   CREATININE 0.92 08/28/2016 1000   CALCIUM 9.4 02/28/2020 0910   GFRNONAA 76 02/28/2020 0910   GFRNONAA >89 08/28/2016 1000   GFRAA 88 02/28/2020 0910   GFRAA >89 08/28/2016 1000    Renal function: CrCl cannot be calculated (Patient's most recent lab result is older than the maximum 21 days allowed.).  Clinical ASCVD: No event; does have a hx of CAD, atypical chest pain The ASCVD Risk score Denman George DC Jr., et al., 2013) failed to calculate for the following reasons:   The 2013 ASCVD risk score is only valid for ages 54 to 30  A/P: Hypertension longstanding currently above goal  on current medications. BP Goal = < 130/80 mmHg. Medication adherence reported. His BP is significantly improved from last visit, however, diastolic pressure is above goal.  -Discontinued lisinopril 40 mg daily. -Start lisinopril-HCTZ 20-12.5 mg; take two tablets (40-25 mg TDD) daily. -Continue amlodipine.   -Counseled on lifestyle modifications for blood pressure control including reduced dietary sodium, increased exercise, adequate sleep.  Results reviewed and written information provided. Total time in face-to-face counseling 15 minutes.   F/U Clinic Visit in 1 month.   Butch Penny, PharmD, CPP Clinical Pharmacist [facility] [phone]

## 2020-07-29 DIAGNOSIS — Z03818 Encounter for observation for suspected exposure to other biological agents ruled out: Secondary | ICD-10-CM | POA: Diagnosis not present

## 2020-08-19 ENCOUNTER — Ambulatory Visit: Payer: BC Managed Care – PPO | Admitting: Pharmacist

## 2020-08-26 ENCOUNTER — Emergency Department (HOSPITAL_COMMUNITY)
Admission: EM | Admit: 2020-08-26 | Discharge: 2020-08-27 | Disposition: A | Discharge status: Home / Self Care | Payer: BC Managed Care – PPO | Attending: Emergency Medicine | Admitting: Emergency Medicine

## 2020-08-26 ENCOUNTER — Other Ambulatory Visit: Payer: Self-pay

## 2020-08-26 ENCOUNTER — Encounter (HOSPITAL_COMMUNITY): Payer: Self-pay | Admitting: Obstetrics and Gynecology

## 2020-08-26 DIAGNOSIS — E1165 Type 2 diabetes mellitus with hyperglycemia: Secondary | ICD-10-CM | POA: Diagnosis not present

## 2020-08-26 DIAGNOSIS — H538 Other visual disturbances: Secondary | ICD-10-CM | POA: Diagnosis not present

## 2020-08-26 DIAGNOSIS — R197 Diarrhea, unspecified: Secondary | ICD-10-CM | POA: Diagnosis not present

## 2020-08-26 DIAGNOSIS — Z7984 Long term (current) use of oral hypoglycemic drugs: Secondary | ICD-10-CM | POA: Insufficient documentation

## 2020-08-26 DIAGNOSIS — Z79899 Other long term (current) drug therapy: Secondary | ICD-10-CM | POA: Insufficient documentation

## 2020-08-26 DIAGNOSIS — N179 Acute kidney failure, unspecified: Secondary | ICD-10-CM | POA: Diagnosis not present

## 2020-08-26 DIAGNOSIS — Z794 Long term (current) use of insulin: Secondary | ICD-10-CM | POA: Diagnosis not present

## 2020-08-26 DIAGNOSIS — I2510 Atherosclerotic heart disease of native coronary artery without angina pectoris: Secondary | ICD-10-CM | POA: Diagnosis not present

## 2020-08-26 DIAGNOSIS — E1169 Type 2 diabetes mellitus with other specified complication: Secondary | ICD-10-CM

## 2020-08-26 DIAGNOSIS — I10 Essential (primary) hypertension: Secondary | ICD-10-CM | POA: Diagnosis present

## 2020-08-26 DIAGNOSIS — I119 Hypertensive heart disease without heart failure: Secondary | ICD-10-CM | POA: Diagnosis not present

## 2020-08-26 DIAGNOSIS — Z8616 Personal history of COVID-19: Secondary | ICD-10-CM | POA: Diagnosis not present

## 2020-08-26 DIAGNOSIS — E119 Type 2 diabetes mellitus without complications: Secondary | ICD-10-CM

## 2020-08-26 DIAGNOSIS — G4730 Sleep apnea, unspecified: Secondary | ICD-10-CM | POA: Diagnosis present

## 2020-08-26 DIAGNOSIS — E8881 Metabolic syndrome: Secondary | ICD-10-CM | POA: Diagnosis present

## 2020-08-26 DIAGNOSIS — R739 Hyperglycemia, unspecified: Secondary | ICD-10-CM

## 2020-08-26 LAB — CBG MONITORING, ED: Glucose-Capillary: 290 mg/dL — ABNORMAL HIGH (ref 70–99)

## 2020-08-26 LAB — COMPREHENSIVE METABOLIC PANEL
ALT: 31 U/L (ref 0–44)
AST: 15 U/L (ref 15–41)
Albumin: 4.8 g/dL (ref 3.5–5.0)
Alkaline Phosphatase: 74 U/L (ref 38–126)
Anion gap: 13 (ref 5–15)
BUN: 44 mg/dL — ABNORMAL HIGH (ref 6–20)
CO2: 17 mmol/L — ABNORMAL LOW (ref 22–32)
Calcium: 10 mg/dL (ref 8.9–10.3)
Chloride: 105 mmol/L (ref 98–111)
Creatinine, Ser: 2.28 mg/dL — ABNORMAL HIGH (ref 0.61–1.24)
GFR, Estimated: 37 mL/min — ABNORMAL LOW (ref >60)
Glucose, Bld: 339 mg/dL — ABNORMAL HIGH (ref 70–99)
Potassium: 5.2 mmol/L — ABNORMAL HIGH (ref 3.5–5.1)
Sodium: 135 mmol/L (ref 135–145)
Total Bilirubin: 0.9 mg/dL (ref 0.3–1.2)
Total Protein: 8.7 g/dL — ABNORMAL HIGH (ref 6.5–8.1)

## 2020-08-26 LAB — CBC
HCT: 40.5 % (ref 39.0–52.0)
Hemoglobin: 13.7 g/dL (ref 13.0–17.0)
MCH: 28.2 pg (ref 26.0–34.0)
MCHC: 33.8 g/dL (ref 30.0–36.0)
MCV: 83.3 fL (ref 80.0–100.0)
Platelets: 245 10*3/uL (ref 150–400)
RBC: 4.86 MIL/uL (ref 4.22–5.81)
RDW: 13.8 % (ref 11.5–15.5)
WBC: 7 10*3/uL (ref 4.0–10.5)
nRBC: 0 % (ref 0.0–0.2)

## 2020-08-26 NOTE — ED Triage Notes (Signed)
Patient reports he is on metformin for his diabetes but has not been able to check his sugar. Patient reports he has been controlled but lately he has been having some diarrhea and blurred vision he thinks is related to his blood sugar.

## 2020-08-27 DIAGNOSIS — E119 Type 2 diabetes mellitus without complications: Secondary | ICD-10-CM

## 2020-08-27 DIAGNOSIS — H538 Other visual disturbances: Secondary | ICD-10-CM | POA: Diagnosis not present

## 2020-08-27 DIAGNOSIS — N179 Acute kidney failure, unspecified: Secondary | ICD-10-CM | POA: Diagnosis present

## 2020-08-27 DIAGNOSIS — E1165 Type 2 diabetes mellitus with hyperglycemia: Secondary | ICD-10-CM | POA: Diagnosis not present

## 2020-08-27 DIAGNOSIS — Z794 Long term (current) use of insulin: Secondary | ICD-10-CM

## 2020-08-27 DIAGNOSIS — E8881 Metabolic syndrome: Secondary | ICD-10-CM | POA: Diagnosis present

## 2020-08-27 LAB — BASIC METABOLIC PANEL
Anion gap: 10 (ref 5–15)
BUN: 41 mg/dL — ABNORMAL HIGH (ref 6–20)
CO2: 18 mmol/L — ABNORMAL LOW (ref 22–32)
Calcium: 9.4 mg/dL (ref 8.9–10.3)
Chloride: 108 mmol/L (ref 98–111)
Creatinine, Ser: 1.88 mg/dL — ABNORMAL HIGH (ref 0.61–1.24)
GFR, Estimated: 47 mL/min — ABNORMAL LOW (ref >60)
Glucose, Bld: 247 mg/dL — ABNORMAL HIGH (ref 70–99)
Potassium: 4.8 mmol/L (ref 3.5–5.1)
Sodium: 136 mmol/L (ref 135–145)

## 2020-08-27 LAB — BLOOD GAS, VENOUS
Acid-base deficit: 6.2 mmol/L — ABNORMAL HIGH (ref 0.0–2.0)
Bicarbonate: 19.2 mmol/L — ABNORMAL LOW (ref 20.0–28.0)
O2 Saturation: 84.3 %
Patient temperature: 98.6
pCO2, Ven: 39.5 mmHg — ABNORMAL LOW (ref 44.0–60.0)
pH, Ven: 7.307 (ref 7.250–7.430)
pO2, Ven: 55.7 mmHg — ABNORMAL HIGH (ref 32.0–45.0)

## 2020-08-27 LAB — CBG MONITORING, ED
Glucose-Capillary: 225 mg/dL — ABNORMAL HIGH (ref 70–99)
Glucose-Capillary: 232 mg/dL — ABNORMAL HIGH (ref 70–99)
Glucose-Capillary: 241 mg/dL — ABNORMAL HIGH (ref 70–99)

## 2020-08-27 MED ORDER — LISINOPRIL 40 MG PO TABS: 40.0000 mg | ORAL_TABLET | Freq: Every day | ORAL | 0 refills | Status: DC

## 2020-08-27 MED ORDER — BASAGLAR KWIKPEN 100 UNIT/ML ~~LOC~~ SOPN: 10.0000 [IU] | PEN_INJECTOR | Freq: Every day | SUBCUTANEOUS | 1 refills | Status: DC

## 2020-08-27 MED ORDER — SODIUM CHLORIDE 0.9 % IV BOLUS
1000.0000 mL | Freq: Once | INTRAVENOUS | Status: AC
Start: 2020-08-27 — End: 2020-08-27
  Administered 2020-08-27: 1000 mL via INTRAVENOUS

## 2020-08-27 NOTE — Consult Note (Signed)
History and Physical   William Moss ALP:[id number] DOB: 03/03/1986 DOA: 08/26/2020  PCP: Hoy Register, MD  Outpatient Specialists: none Patient coming from: home   I have personally briefly reviewed patient's old medical records in [facility] EMR.  Chief Concern: blurry vision   HPI: William Moss is a 34 y.o. male with medical history significant for hypertension, non-insulin-dependent diabetes mellitus, morbid obesity, history of Covid pneumonia in 06/2019, presented to the emergency room for chief concerns of blurry vision.  He reports that he has been having developed blurry vision and diarrhea for 2 weeks.  He denies changes to diet.  Of note, patient's home medication was recently changed from lisinopril 40 mg daily to combination blood pressure medication lisinopril-hctz 20-12.5 mg, take two tablets, (40-25 mg total) per day on 07/22/20.  He denies double vision, blood in his stool, mucus, fever, nausea, vomiting, dysuria, hematuria.  Patient endorses good access to primary care provider and states he can get an appointment on Tuesday, 08/27/2020 or Wednesday, 12/29 05/2020 to get his labs rechecked.  ROS: Constitutional: no weight change, no fever ENT/Mouth: no sore throat, no rhinorrhea Eyes: no eye pain, no vision changes, + blurry vision Cardiovascular: no chest pain, no dyspnea,  no edema, no palpitations Respiratory: no cough, no sputum, no wheezing Gastrointestinal: no nausea, no vomiting, no diarrhea, no constipation Genitourinary: no urinary incontinence, no dysuria, no hematuria Musculoskeletal: no arthralgias, no myalgias Skin: no skin lesions, no pruritus, Neuro: - weakness, no loss of consciousness, no syncope Psych: no anxiety, no depression, - decrease appetite Heme/Lymph: no bruising, no bleeding  ED Course: Discussed with ED provider, requesting admission for acute kidney injury.   ED vitals: Temperature 99.2, respiration rate 16-18, heart rate 85-97, blood  pressure 131/77, 98% on RA  sCr is 2.28 (1.22 on 02/28/20)   Assessment/Plan  Principal Problem:   AKI (acute kidney injury) (HCC) Active Problems:   Essential hypertension   Morbid obesity (HCC)   Non-insulin dependent type 2 diabetes mellitus (HCC)   Sleep apnea   AKI - secondary to medication changes in July 22, 2021 1 - Recommend to stop lisinopril-hctz 40-25 at home -Recommend patient to be prescribed lisinopril 40 mg daily on discharge per continue his home medication of lisinopril to 40 mg daily -Resume amlodipine 10 mg daily, Coreg 25 mg twice daily daily -Recommend ED doctor to check BMP 1 more time prior to discharge -Recommend follow-up with PCP in 1 to 2 days to reevaluate blood pressure regimen  Mild metabolic acidosis - secondary to GI loss and AKI Blurry vision - outpatient follow-up with ophthalmology recommended for annual eye exams  -He states he has not been to get his eyes checked in about 2 years -Outpatient follow-up to get vision checked  Dependent diabetes mellitus-on Metformin 500 mg twice daily, continue -Patient self discontinued insulin because he states that his A1c was normal when his doctor last checked it -Recommend patient to resume insulin per primary care doctor 10 units via insulin pen daily -Follow-up with outpatient primary care doctor -Diabetic diet avoid processed sugar, consume lean protein  Morbid obesity and metabolic syndrome - recommend diet changes, avoid processed sugar. Eat lean meats  Chart reviewed.   Admission status: Recommend discharge from ED and follow-up with primary care doctor  Past Medical History:  Diagnosis Date  . Atypical chest pain 05/27/2016   a. 05/2016: NST showing small area of moderare anteroapical ischemia b. 05/2016: cath showing tortous but normal cors which confirmed a false  positive NST.  . Diabetes mellitus without complication (HCC)   . GERD (gastroesophageal reflux disease) 10/02/2016  .  Hypertension    Past Surgical History:  Procedure Laterality Date  . CARDIAC CATHETERIZATION N/A 06/10/2016   Procedure: Left Heart Cath and Coronary Angiography;  Surgeon: Iran Ouch, MD;  Location: [facility] INVASIVE CV LAB;  Service: Cardiovascular;  Laterality: N/A;  . COLONOSCOPY N/A 02/04/2016   Procedure: COLONOSCOPY;  Surgeon: Hilarie Fredrickson, MD;  Location: [facility] ENDOSCOPY;  Service: Endoscopy;  Laterality: N/A;  . NO PAST SURGERIES     Social History:  reports that he has never smoked. He has never used smokeless tobacco. He reports that he does not drink alcohol and does not use drugs.  No Known Allergies Family History  Problem Relation Age of Onset  . Diabetes Mother   . Diabetes Father   . Cancer Maternal Grandmother   . Breast cancer Cousin   . Alzheimer's disease Paternal Grandmother    Family history: Family history reviewed and not pertinent  Prior to Admission medications   Medication Sig Start Date End Date Taking? Authorizing Provider  amLODipine (NORVASC) 10 MG tablet Take 1 tablet (10 mg total) by mouth daily. 05/30/20 08/28/20  Hoy Register, MD  atorvastatin (LIPITOR) 40 MG tablet Take 1 tablet (40 mg total) by mouth daily. 05/30/20   Hoy Register, MD  Blood Glucose Monitoring Suppl (TRUE METRIX METER) DEVI 1 each by Does not apply route 3 (three) times daily before meals. Patient not taking: Reported on 05/30/2020 07/29/15   Hoy Register, MD  carvedilol (COREG) 25 MG tablet Take 1 tablet (25 mg total) by mouth 2 (two) times daily. 05/30/20   Hoy Register, MD  glucose blood (TRUE METRIX BLOOD GLUCOSE TEST) test strip 3 times daily before meals Patient not taking: Reported on 05/30/2020 07/03/19   Hoy Register, MD  Insulin Glargine (BASAGLAR KWIKPEN) 100 UNIT/ML Inject 8 Units into the skin daily. 05/30/20   Hoy Register, MD  Insulin Pen Needle (TRUEPLUS PEN NEEDLES) 32G X 4 MM MISC Use as instructed with Victoza. Patient not taking: Reported on 05/30/2020  07/04/19   Hoy Register, MD  Insulin Pen Needle 29G X MISC 10 Units by Does not apply route daily. 07/02/19   Drema Dallas, MD  Ipratropium-Albuterol (COMBIVENT) 20-100 MCG/ACT AERS respimat Inhale 1 puff into the lungs 4 (four) times daily. Patient not taking: Reported on 05/30/2020 07/02/19   Drema Dallas, MD  lisinopril-hydrochlorothiazide (ZESTORETIC) 20-12.5 MG tablet Take 2 tablets by mouth daily. 07/22/20   Hoy Register, MD  metFORMIN (GLUCOPHAGE) 500 MG tablet Take 2 tablets (1,000 mg total) by mouth 2 (two) times daily with a meal. 05/30/20   Newlin, Enobong, MD  terbinafine (LAMISIL AT) 1 % cream Apply 1 application topically 2 (two) times daily. 05/30/20   Hoy Register, MD  TRUEplus Lancets 28G MISC 1 each by Does not apply route 3 (three) times daily before meals. Patient not taking: Reported on 05/30/2020 07/03/19   Hoy Register, MD   Physical Exam: Vitals:   08/26/20 2356 08/27/20 0002 08/27/20 0100 08/27/20 0253  BP: (!) 130/97 109/84 125/85 128/77  Pulse: 97 98 89 85  Resp: 18 16 16 16   Temp:      TempSrc:      SpO2: 99% 97% 97% 98%  Weight:      Height:       Constitutional: appears age-appropriate, NAD, calm, comfortable Eyes: PERRL, lids and conjunctivae normal ENMT: Mucous membranes  are moist. Posterior pharynx clear of any exudate or lesions. Age-appropriate dentition. Hearing appropriate Neck: normal, supple, no masses, no thyromegaly Respiratory: clear to auscultation bilaterally, no wheezing, no crackles. Normal respiratory effort. No accessory muscle use.  Cardiovascular: Regular rate and rhythm, no murmurs / rubs / gallops. No extremity edema. 2+ pedal pulses. No carotid bruits.  Abdomen: His abdomen, no tenderness, no masses palpated, no hepatosplenomegaly. Bowel sounds positive.  Musculoskeletal: no clubbing / cyanosis. No joint deformity upper and lower extremities. Good ROM, no contractures, no atrophy. Normal muscle tone.  Skin: no rashes,  lesions, ulcers. No induration Neurologic: Sensation intact. Strength 5/5 in all 4.  Psychiatric: Normal judgment and insight. Alert and oriented x 3. Normal mood.   EKG: Not indicated  Chest x-ray on Admission: Not indicated  No results found.  Labs on Admission: I have personally reviewed following labs  CBC: Recent Labs  Lab 08/26/20 1749  WBC 7.0  HGB 13.7  HCT 40.5  MCV 83.3  PLT 245   Basic Metabolic Panel: Recent Labs  Lab 08/26/20 1749  NA 135  K 5.2*  CL 105  CO2 17*  GLUCOSE 339*  BUN 44*  CREATININE 2.28*  CALCIUM 10.0   GFR: Estimated Creatinine Clearance: 70.6 mL/min (A) (by C-G formula based on SCr of 2.28 mg/dL (H)).  Liver Function Tests: Recent Labs  Lab 08/26/20 1749  AST 15  ALT 31  ALKPHOS 74  BILITOT 0.9  PROT 8.7*  ALBUMIN 4.8   CBG: Recent Labs  Lab 08/26/20 1742 08/27/20 0007 08/27/20 0222 08/27/20 0310  GLUCAP 290* 241* 225* 232*   Urine analysis:    Component Value Date/Time   COLORURINE STRAW (A) 01/18/2019 1840   APPEARANCEUR CLEAR 01/18/2019 1840   LABSPEC 1.021 01/18/2019 1840   PHURINE 5.0 01/18/2019 1840   GLUCOSEU >=500 (A) 01/18/2019 1840   HGBUR NEGATIVE 01/18/2019 1840   BILIRUBINUR NEGATIVE 01/18/2019 1840   KETONESUR NEGATIVE 01/18/2019 1840   PROTEINUR NEGATIVE 01/18/2019 1840   NITRITE NEGATIVE 01/18/2019 1840   LEUKOCYTESUR NEGATIVE 01/18/2019 1840   Dazhane Villagomez N Ashwini Jago D.O. [facility]  If 7AM-7AM, please contact overnight-coverage provider If 7AM-7PM, please contact day coverage provider [email or web address]  08/27/2020, 3:52 AM

## 2020-08-27 NOTE — ED Provider Notes (Signed)
[facility]-EMERGENCY DEPT Provider Note   CSN: [id number] Arrival date & time: 08/26/20  1720     History Chief Complaint  Patient presents with  . Blurred Vision    William Moss is a 34 y.o. male.  Patient with a history of morbid obesity, DM, HTN, GERD presents for evaluation of blurred vision that started in the last couple of days, as well as multiple episodes diarrhea daily. No vomiting, chest pain, SOB, fever, cough or congestion. He reports he is COVID vaccinated. He has been unable to check his blood sugar but reports he is compliant with or medication. He is not prescribed insulin. He reports that since he arrived to the ED his vision has improved.   The history is provided by the patient. No language interpreter was used.       Past Medical History:  Diagnosis Date  . Atypical chest pain 05/27/2016   a. 05/2016: NST showing small area of moderare anteroapical ischemia b. 05/2016: cath showing tortous but normal cors which confirmed a false positive NST.  . Diabetes mellitus without complication (HCC)   . GERD (gastroesophageal reflux disease) 10/02/2016  . Hypertension     Patient Active Problem List   Diagnosis Date Noted  . Diabetes mellitus due to underlying condition, controlled, with complication, without long-term current use of insulin (HCC)   . Pure hypercholesterolemia   . Morbid obesity with BMI of 60.0-69.9, adult (HCC)   . Pneumonia due to COVID-19 virus 06/28/2019  . Diabetes mellitus type 2, uncontrolled, with complications (HCC) 06/28/2019  . Sinus tachycardia 06/27/2019  . COVID-19 virus infection 06/27/2019  . Sleep apnea 03/27/2019  . Normal coronary arteries 03/27/2019  . GERD (gastroesophageal reflux disease) 10/02/2016  . Coronary artery disease involving native heart   . Abnormal nuclear stress test   . Atypical chest pain 05/27/2016  . Non-insulin dependent type 2 diabetes mellitus (HCC) 09/30/2015  . Hematochezia  09/30/2015  . Essential hypertension 07/29/2015  . Morbid obesity (HCC) 07/29/2015    Past Surgical History:  Procedure Laterality Date  . CARDIAC CATHETERIZATION N/A 06/10/2016   Procedure: Left Heart Cath and Coronary Angiography;  Surgeon: Iran Ouch, MD;  Location: [facility] INVASIVE CV LAB;  Service: Cardiovascular;  Laterality: N/A;  . COLONOSCOPY N/A 02/04/2016   Procedure: COLONOSCOPY;  Surgeon: Hilarie Fredrickson, MD;  Location: [facility] ENDOSCOPY;  Service: Endoscopy;  Laterality: N/A;  . NO PAST SURGERIES         Family History  Problem Relation Age of Onset  . Diabetes Mother   . Diabetes Father   . Cancer Maternal Grandmother   . Breast cancer Cousin   . Alzheimer's disease Paternal Grandmother     Social History   Tobacco Use  . Smoking status: Never Smoker  . Smokeless tobacco: Never Used  Substance Use Topics  . Alcohol use: No  . Drug use: No    Home Medications Prior to Admission medications   Medication Sig Start Date End Date Taking? Authorizing Provider  amLODipine (NORVASC) 10 MG tablet Take 1 tablet (10 mg total) by mouth daily. 05/30/20 08/28/20  Hoy Register, MD  atorvastatin (LIPITOR) 40 MG tablet Take 1 tablet (40 mg total) by mouth daily. 05/30/20   Hoy Register, MD  Blood Glucose Monitoring Suppl (TRUE METRIX METER) DEVI 1 each by Does not apply route 3 (three) times daily before meals. Patient not taking: Reported on 05/30/2020 07/29/15   Hoy Register, MD  carvedilol (COREG) 25 MG tablet  Take 1 tablet (25 mg total) by mouth 2 (two) times daily. 05/30/20   Hoy RegisterNewlin, Enobong, MD  glucose blood (TRUE METRIX BLOOD GLUCOSE TEST) test strip 3 times daily before meals Patient not taking: Reported on 05/30/2020 07/03/19   Hoy RegisterNewlin, Enobong, MD  Insulin Glargine (BASAGLAR KWIKPEN) 100 UNIT/ML Inject 8 Units into the skin daily. 05/30/20   Hoy RegisterNewlin, Enobong, MD  Insulin Pen Needle (TRUEPLUS PEN NEEDLES) 32G X 4 MM MISC Use as instructed with Victoza. Patient not  taking: Reported on 05/30/2020 07/04/19   Hoy RegisterNewlin, Enobong, MD  Insulin Pen Needle 29G X 12MM MISC 10 Units by Does not apply route daily. 07/02/19   Drema DallasWoods, Curtis J, MD  Ipratropium-Albuterol (COMBIVENT) 20-100 MCG/ACT AERS respimat Inhale 1 puff into the lungs 4 (four) times daily. Patient not taking: Reported on 05/30/2020 07/02/19   Drema DallasWoods, Curtis J, MD  lisinopril-hydrochlorothiazide (ZESTORETIC) 20-12.5 MG tablet Take 2 tablets by mouth daily. 07/22/20   Hoy RegisterNewlin, Enobong, MD  metFORMIN (GLUCOPHAGE) 500 MG tablet Take 2 tablets (1,000 mg total) by mouth 2 (two) times daily with a meal. 05/30/20   Newlin, Enobong, MD  terbinafine (LAMISIL AT) 1 % cream Apply 1 application topically 2 (two) times daily. 05/30/20   Hoy RegisterNewlin, Enobong, MD  TRUEplus Lancets 28G MISC 1 each by Does not apply route 3 (three) times daily before meals. Patient not taking: Reported on 05/30/2020 07/03/19   Hoy RegisterNewlin, Enobong, MD    Allergies    Patient has no known allergies.  Review of Systems   Review of Systems  Constitutional: Negative for chills and fever.  HENT: Negative.   Eyes: Positive for visual disturbance.  Respiratory: Negative.  Negative for cough and shortness of breath.   Cardiovascular: Negative.   Gastrointestinal: Positive for diarrhea. Negative for abdominal pain and vomiting.  Musculoskeletal: Negative.   Skin: Negative.   Neurological: Negative.     Physical Exam Updated Vital Signs BP 109/84 (BP Location: Left Arm)   Pulse 98   Temp 99.2 F (37.3 C) (Oral)   Resp 16   Ht 5\' 9"  (1.753 m)   Wt (!) 172.4 kg   SpO2 97%   BMI 56.12 kg/m   Physical Exam Vitals and nursing note reviewed.  Constitutional:      Appearance: He is well-developed and well-nourished. He is obese.  HENT:     Head: Normocephalic.     Mouth/Throat:     Mouth: Mucous membranes are dry.  Eyes:     Conjunctiva/sclera: Conjunctivae normal.  Cardiovascular:     Rate and Rhythm: Normal rate and regular rhythm.     Heart  sounds: No murmur heard.   Pulmonary:     Effort: Pulmonary effort is normal.     Breath sounds: Normal breath sounds. No wheezing, rhonchi or rales.  Chest:     Chest wall: No tenderness.  Abdominal:     General: Bowel sounds are normal.     Palpations: Abdomen is soft.     Tenderness: There is no abdominal tenderness. There is no guarding or rebound.  Musculoskeletal:        General: Normal range of motion.     Cervical back: Normal range of motion and neck supple.     Right lower leg: No edema.     Left lower leg: No edema.  Skin:    General: Skin is warm and dry.     Findings: No rash.  Neurological:     General: No focal deficit present.  Mental Status: He is alert and oriented to person, place, and time.     Sensory: No sensory deficit.  Psychiatric:        Mood and Affect: Mood and affect normal.     ED Results / Procedures / Treatments   Labs (all labs ordered are listed, but only abnormal results are displayed) Labs Reviewed  COMPREHENSIVE METABOLIC PANEL - Abnormal; Notable for the following components:      Result Value   Potassium 5.2 (*)    CO2 17 (*)    Glucose, Bld 339 (*)    BUN 44 (*)    Creatinine, Ser 2.28 (*)    Total Protein 8.7 (*)    GFR, Estimated 37 (*)    All other components within normal limits  BLOOD GAS, VENOUS - Abnormal; Notable for the following components:   pCO2, Ven 39.5 (*)    pO2, Ven 55.7 (*)    Bicarbonate 19.2 (*)    Acid-base deficit 6.2 (*)    All other components within normal limits  CBG MONITORING, ED - Abnormal; Notable for the following components:   Glucose-Capillary 290 (*)    All other components within normal limits  CBG MONITORING, ED - Abnormal; Notable for the following components:   Glucose-Capillary 241 (*)    All other components within normal limits  CBC   Results for orders placed or performed during the hospital encounter of 08/26/20  Comprehensive metabolic panel  Result Value Ref Range    Sodium 135 135 - 145 mmol/L   Potassium 5.2 (H) 3.5 - 5.1 mmol/L   Chloride 105 98 - 111 mmol/L   CO2 17 (L) 22 - 32 mmol/L   Glucose, Bld 339 (H) 70 - 99 mg/dL   BUN 44 (H) 6 - 20 mg/dL   Creatinine, Ser 8.91 (H) 0.61 - 1.24 mg/dL   Calcium 69.4 8.9 - 50.3 mg/dL   Total Protein 8.7 (H) 6.5 - 8.1 g/dL   Albumin 4.8 3.5 - 5.0 g/dL   AST 15 15 - 41 U/L   ALT 31 0 - 44 U/L   Alkaline Phosphatase 74 38 - 126 U/L   Total Bilirubin 0.9 0.3 - 1.2 mg/dL   GFR, Estimated 37 (L) >60 mL/min   Anion gap 13 5 - 15  CBC  Result Value Ref Range   WBC 7.0 4.0 - 10.5 K/uL   RBC 4.86 4.22 - 5.81 MIL/uL   Hemoglobin 13.7 13.0 - 17.0 g/dL   HCT 88.8 28.0 - 03.4 %   MCV 83.3 80.0 - 100.0 fL   MCH 28.2 26.0 - 34.0 pg   MCHC 33.8 30.0 - 36.0 g/dL   RDW 91.7 91.5 - 05.6 %   Platelets 245 150 - 400 K/uL   nRBC 0.0 0.0 - 0.2 %  Blood gas, venous  Result Value Ref Range   pH, Ven 7.307 7.250 - 7.430   pCO2, Ven 39.5 (L) 44.0 - 60.0 mmHg   pO2, Ven 55.7 (H) 32.0 - 45.0 mmHg   Bicarbonate 19.2 (L) 20.0 - 28.0 mmol/L   Acid-base deficit 6.2 (H) 0.0 - 2.0 mmol/L   O2 Saturation 84.3 %   Patient temperature 98.6   CBG monitoring, ED  Result Value Ref Range   Glucose-Capillary 290 (H) 70 - 99 mg/dL  CBG monitoring, ED  Result Value Ref Range   Glucose-Capillary 241 (H) 70 - 99 mg/dL  CBG monitoring, ED  Result Value Ref Range   Glucose-Capillary 225 (H)  70 - 99 mg/dL  CBG monitoring, ED  Result Value Ref Range   Glucose-Capillary 232 (H) 70 - 99 mg/dL    EKG None  Radiology No results found.  Procedures Procedures (including critical care time)  Medications Ordered in ED Medications  sodium chloride 0.9 % bolus 1,000 mL (1,000 mLs Intravenous New Bag/Given 08/27/20 0046)    ED Course  I have reviewed the triage vital signs and the nursing notes.  Pertinent labs & imaging results that were available during my care of the patient were reviewed by me and considered in my medical  decision making (see chart for details).    MDM Rules/Calculators/A&P                          Patient to ED with blurred vision and diarrhea for 2-3 days. He feels it may be because of elevated blood sugar which he has not been able to check at home. He denies missing any of his medications. No vomiting.   He is overall well appearing. He has elevated CBG of 339 on arrival that has decreased without medication to 232. No sign of DKA - mild normal gap acidosis (CO2 17) with normal pH of 7.307. Of greater concern is an AKI with Cr 2.23 (last comparable 1.22) and BUN 44.   Discussed with Dr. Sedalia Muta, [facility], who provides consultation which is very appreciated. Recommendations as follows:  AKI - secondary to medication changes in July 22, 2021 1 - Recommend to stop lisinopril-hctz 40-25 at home -Recommend patient to be prescribed lisinopril 40 mg daily on discharge per continue his home medication of lisinopril to 40 mg daily -Resume amlodipine 10 mg daily, Coreg 25 mg twice daily daily -Recommend ED doctor to check BMP 1 more time prior to discharge -Recommend follow-up with PCP in 1 to 2 days to reevaluate blood pressure regimen  Mild metabolic acidosis - secondary to GI loss and AKI Blurry vision - outpatient follow-up with ophthalmology recommended for annual eye exams  -He states he has not been to get his eyes checked in about 2 years -Outpatient follow-up to get vision checked  Dependent diabetes mellitus-on Metformin 500 mg twice daily, continue -Patient self discontinued insulin because he states that his A1c was normal when his doctor last checked it -Recommend patient to resume insulin per primary care doctor 10 units via insulin pen daily -Follow-up with outpatient primary care doctor -Diabetic diet avoid processed sugar, consume lean protein  Morbid obesity and metabolic syndrome - recommend diet changes, avoid processed sugar. Eat lean meats  _____  BMEt repeat ordered  prior to discharge. Will send PCP (Dr. Alvis Lemmings) an Epic message with patient's care plan.  BMET shows improvement in renal function, CO2. Feel he can be discharged as planned.   Final Clinical Impression(s) / ED Diagnoses Final diagnoses:  None   1. AKI 2. Hyperglycemia 3. Blurred vision  Rx / DC Orders ED Discharge Orders    None       Elpidio Anis, Cordelia Poche 08/27/20 Arbutus Ped, MD 09/03/20 1201

## 2020-08-27 NOTE — Discharge Instructions (Addendum)
Please follow up with your doctor this week for further outpatient management of visual changes, high blood sugar, recheck of your kidney function and the changes made to your medications tonight.   Please return to the emergency department with any new or worsening symptoms of concern.

## 2020-09-10 ENCOUNTER — Other Ambulatory Visit: Payer: BC Managed Care – PPO

## 2020-09-10 DIAGNOSIS — Z20822 Contact with and (suspected) exposure to covid-19: Secondary | ICD-10-CM

## 2020-09-11 LAB — NOVEL CORONAVIRUS, NAA: SARS-CoV-2, NAA: NOT DETECTED

## 2020-09-11 LAB — SARS-COV-2, NAA 2 DAY TAT

## 2020-10-01 DIAGNOSIS — Z03818 Encounter for observation for suspected exposure to other biological agents ruled out: Secondary | ICD-10-CM | POA: Diagnosis not present

## 2020-10-07 ENCOUNTER — Other Ambulatory Visit: Payer: Self-pay | Admitting: Pharmacist

## 2020-10-07 ENCOUNTER — Other Ambulatory Visit: Payer: Self-pay

## 2020-10-07 ENCOUNTER — Encounter: Payer: Self-pay | Admitting: Family Medicine

## 2020-10-07 ENCOUNTER — Ambulatory Visit: Payer: BC Managed Care – PPO | Attending: Family Medicine | Admitting: Family Medicine

## 2020-10-07 VITALS — BP 128/80 | HR 83 | Ht 69.0 in | Wt 359.4 lb

## 2020-10-07 DIAGNOSIS — Z1159 Encounter for screening for other viral diseases: Secondary | ICD-10-CM

## 2020-10-07 DIAGNOSIS — I152 Hypertension secondary to endocrine disorders: Secondary | ICD-10-CM

## 2020-10-07 DIAGNOSIS — E1169 Type 2 diabetes mellitus with other specified complication: Secondary | ICD-10-CM

## 2020-10-07 DIAGNOSIS — Z794 Long term (current) use of insulin: Secondary | ICD-10-CM

## 2020-10-07 DIAGNOSIS — E1159 Type 2 diabetes mellitus with other circulatory complications: Secondary | ICD-10-CM | POA: Diagnosis not present

## 2020-10-07 DIAGNOSIS — Z9114 Patient's other noncompliance with medication regimen: Secondary | ICD-10-CM | POA: Diagnosis not present

## 2020-10-07 LAB — POCT URINALYSIS DIP (CLINITEK)
Bilirubin, UA: NEGATIVE
Blood, UA: NEGATIVE
Glucose, UA: >=1000 mg/dL — AB
Ketones, POC UA: NEGATIVE mg/dL
Leukocytes, UA: NEGATIVE
Nitrite, UA: NEGATIVE
Spec Grav, UA: 1.015 (ref 1.010–1.025)
Urobilinogen, UA: 0.2 E.U./dL
pH, UA: 5.5 (ref 5.0–8.0)

## 2020-10-07 LAB — POCT GLYCOSYLATED HEMOGLOBIN (HGB A1C): HbA1c, POC (controlled diabetic range): 13.4 % — AB (ref 0.0–7.0)

## 2020-10-07 LAB — GLUCOSE, POCT (MANUAL RESULT ENTRY)
POC Glucose: 575 mg/dl — AB (ref 70–99)
POC Glucose: 578 mg/dl — AB (ref 70–99)

## 2020-10-07 MED ORDER — ONETOUCH VERIO REFLECT W/DEVICE KIT: PACK | 0 refills | Status: AC

## 2020-10-07 MED ORDER — AMLODIPINE BESYLATE 10 MG PO TABS
10.0000 mg | ORAL_TABLET | Freq: Every day | ORAL | 1 refills | Status: DC
Start: 2020-10-07 — End: 2021-04-05

## 2020-10-07 MED ORDER — CARVEDILOL 25 MG PO TABS
25.0000 mg | ORAL_TABLET | Freq: Two times a day (BID) | ORAL | 1 refills | Status: DC
Start: 2020-10-07 — End: 2021-04-10

## 2020-10-07 MED ORDER — INSULIN ASPART 100 UNIT/ML ~~LOC~~ SOLN
25.0000 [IU] | Freq: Once | SUBCUTANEOUS | Status: AC
Start: 2020-10-07 — End: 2020-10-07
  Administered 2020-10-07: 25 [IU] via SUBCUTANEOUS

## 2020-10-07 MED ORDER — ONETOUCH DELICA PLUS LANCET33G MISC: 2 refills | Status: DC

## 2020-10-07 MED ORDER — BASAGLAR KWIKPEN 100 UNIT/ML ~~LOC~~ SOPN: 10.0000 [IU] | PEN_INJECTOR | Freq: Every day | SUBCUTANEOUS | 1 refills | Status: DC

## 2020-10-07 MED ORDER — BD PEN NEEDLE NANO U/F 32G X 4 MM MISC: 10.0000 [IU] | Freq: Every day | 3 refills | Status: AC

## 2020-10-07 MED ORDER — ONETOUCH VERIO VI STRP: ORAL_STRIP | 2 refills | Status: DC

## 2020-10-07 MED ORDER — ATORVASTATIN CALCIUM 40 MG PO TABS: 40.0000 mg | ORAL_TABLET | Freq: Every day | ORAL | 1 refills | Status: DC

## 2020-10-07 MED ORDER — METFORMIN HCL 500 MG PO TABS: 1000.0000 mg | ORAL_TABLET | Freq: Two times a day (BID) | ORAL | 1 refills | Status: DC

## 2020-10-07 NOTE — Patient Instructions (Signed)
Diabetes Mellitus and Nutrition, Adult When you have diabetes, or diabetes mellitus, it is very important to have healthy eating habits because your blood sugar (glucose) levels are greatly affected by what you eat and drink. Eating healthy foods in the right amounts, at about the same times every day, can help you:  Control your blood glucose.  Lower your risk of heart disease.  Improve your blood pressure.  Reach or maintain a healthy weight. What can affect my meal plan? Every person with diabetes is different, and each person has different needs for a meal plan. Your health care provider may recommend that you work with a dietitian to make a meal plan that is best for you. Your meal plan may vary depending on factors such as:  The calories you need.  The medicines you take.  Your weight.  Your blood glucose, blood pressure, and cholesterol levels.  Your activity level.  Other health conditions you have, such as heart or kidney disease. How do carbohydrates affect me? Carbohydrates, also called carbs, affect your blood glucose level more than any other type of food. Eating carbs naturally raises the amount of glucose in your blood. Carb counting is a method for keeping track of how many carbs you eat. Counting carbs is important to keep your blood glucose at a healthy level, especially if you use insulin or take certain oral diabetes medicines. It is important to know how many carbs you can safely have in each meal. This is different for every person. Your dietitian can help you calculate how many carbs you should have at each meal and for each snack. How does alcohol affect me? Alcohol can cause a sudden decrease in blood glucose (hypoglycemia), especially if you use insulin or take certain oral diabetes medicines. Hypoglycemia can be a life-threatening condition. Symptoms of hypoglycemia, such as sleepiness, dizziness, and confusion, are similar to symptoms of having too much  alcohol.  Do not drink alcohol if: ? Your health care provider tells you not to drink. ? You are pregnant, may be pregnant, or are planning to become pregnant.  If you drink alcohol: ? Do not drink on an empty stomach. ? Limit how much you use to:  0-1 drink a day for women.  0-2 drinks a day for men. ? Be aware of how much alcohol is in your drink. In the [place] one drink equals one 12 oz bottle of beer (355 mL), one 5 oz glass of wine (148 mL), or one 1 oz glass of hard liquor (44 mL). ? Keep yourself hydrated with water, diet soda, or unsweetened iced tea.  Keep in mind that regular soda, juice, and other mixers may contain a lot of sugar and must be counted as carbs. What are tips for following this plan? Reading food labels  Start by checking the serving size on the "Nutrition Facts" label of packaged foods and drinks. The amount of calories, carbs, fats, and other nutrients listed on the label is based on one serving of the item. Many items contain more than one serving per package.  Check the total grams (g) of carbs in one serving. You can calculate the number of servings of carbs in one serving by dividing the total carbs by 15. For example, if a food has 30 g of total carbs per serving, it would be equal to 2 servings of carbs.  Check the number of grams (g) of saturated fats and trans fats in one serving. Choose foods that have   a low amount or none of these fats.  Check the number of milligrams (mg) of salt (sodium) in one serving. Most people should limit total sodium intake to less than 2,300 mg per day.  Always check the nutrition information of foods labeled as "low-fat" or "nonfat." These foods may be higher in added sugar or refined carbs and should be avoided.  Talk to your dietitian to identify your daily goals for nutrients listed on the label. Shopping  Avoid buying canned, pre-made, or processed foods. These foods tend to be high in fat, sodium, and added  sugar.  Shop around the outside edge of the grocery store. This is where you will most often find fresh fruits and vegetables, bulk grains, fresh meats, and fresh dairy. Cooking  Use low-heat cooking methods, such as baking, instead of high-heat cooking methods like deep frying.  Cook using healthy oils, such as olive, canola, or sunflower oil.  Avoid cooking with butter, cream, or high-fat meats. Meal planning  Eat meals and snacks regularly, preferably at the same times every day. Avoid going long periods of time without eating.  Eat foods that are high in fiber, such as fresh fruits, vegetables, beans, and whole grains. Talk with your dietitian about how many servings of carbs you can eat at each meal.  Eat 4-6 oz (112-168 g) of lean protein each day, such as lean meat, chicken, fish, eggs, or tofu. One ounce (oz) of lean protein is equal to: ? 1 oz (28 g) of meat, chicken, or fish. ? 1 egg. ?  cup (62 g) of tofu.  Eat some foods each day that contain healthy fats, such as avocado, nuts, seeds, and fish.   What foods should I eat? Fruits Berries. Apples. Oranges. Peaches. Apricots. Plums. Grapes. Mango. Papaya. Pomegranate. Kiwi. Cherries. Vegetables Lettuce. Spinach. Leafy greens, including kale, chard, collard greens, and mustard greens. Beets. Cauliflower. Cabbage. Broccoli. Carrots. Green beans. Tomatoes. Peppers. Onions. Cucumbers. Brussels sprouts. Grains Whole grains, such as whole-wheat or whole-grain bread, crackers, tortillas, cereal, and pasta. Unsweetened oatmeal. Quinoa. Brown or wild rice. Meats and other proteins Seafood. Poultry without skin. Lean cuts of poultry and beef. Tofu. Nuts. Seeds. Dairy Low-fat or fat-free dairy products such as milk, yogurt, and cheese. The items listed above may not be a complete list of foods and beverages you can eat. Contact a dietitian for more information. What foods should I avoid? Fruits Fruits canned with  syrup. Vegetables Canned vegetables. Frozen vegetables with butter or cream sauce. Grains Refined white flour and flour products such as bread, pasta, snack foods, and cereals. Avoid all processed foods. Meats and other proteins Fatty cuts of meat. Poultry with skin. Breaded or fried meats. Processed meat. Avoid saturated fats. Dairy Full-fat yogurt, cheese, or milk. Beverages Sweetened drinks, such as soda or iced tea. The items listed above may not be a complete list of foods and beverages you should avoid. Contact a dietitian for more information. Questions to ask a health care provider  Do I need to meet with a diabetes educator?  Do I need to meet with a dietitian?  What number can I call if I have questions?  When are the best times to check my blood glucose? Where to find more information:  American Diabetes Association: diabetes.org  Academy of Nutrition and Dietetics: www.eatright.org  National Institute of Diabetes and Digestive and Kidney Diseases: www.niddk.nih.gov  Association of Diabetes Care and Education Specialists: www.diabeteseducator.org Summary  It is important to have healthy eating   habits because your blood sugar (glucose) levels are greatly affected by what you eat and drink.  A healthy meal plan will help you control your blood glucose and maintain a healthy lifestyle.  Your health care provider may recommend that you work with a dietitian to make a meal plan that is best for you.  Keep in mind that carbohydrates (carbs) and alcohol have immediate effects on your blood glucose levels. It is important to count carbs and to use alcohol carefully. This information is not intended to replace advice given to you by your health care provider. Make sure you discuss any questions you have with your health care provider. Document Revised: 07/25/2019 Document Reviewed: 07/25/2019 Elsevier Patient Education  2021 [employer].  

## 2020-10-07 NOTE — Progress Notes (Signed)
Stopped Lisinopril Has not taken Basaglar due to no pen needles.

## 2020-10-07 NOTE — Progress Notes (Signed)
Subjective:  Patient ID: William Moss, male    DOB: September 06, 1985  Age: 35 y.o. MRN: [id number]  CC: Diabetes   HPI William Moss is a 35 year old male with a history of hypertension, type 2 diabetes mellitus(A1c 13.4), morbid obesity, GERD hospitalized for COVID-19 pneumoniain 06/2019 here for chronic disease management. A1c is 13.4 up from 6.6.  Endorses noncompliance with Basaglar due to the fact that he ran out of pen needles.  He has also not been checking his blood sugars as he does not have test strips. Blood sugar is 577 in the clinic and he had Cheerios 2 hours ago  He was called to come in for labs to evaluate his renal function after an ED visit for acute kidney injury during which lisinopril was discontinued.  He never came into the office to do this.  He informs me it was due to thought that he was exposed to someone with Covid 19. He is fully vaccinated against COVID-19.  Compliant with his antihypertensive and his statin. He has no additional concerns today. Past Medical History:  Diagnosis Date  . Atypical chest pain 05/27/2016   a. 05/2016: NST showing small area of moderare anteroapical ischemia b. 05/2016: cath showing tortous but normal cors which confirmed a false positive NST.  . Diabetes mellitus without complication ([place])   . GERD (gastroesophageal reflux disease) 10/02/2016  . Hypertension     Past Surgical History:  Procedure Laterality Date  . CARDIAC CATHETERIZATION N/A 06/10/2016   Procedure: Left Heart Cath and Coronary Angiography;  Surgeon: Wellington Hampshire, MD;  Location: [facility];  Service: Cardiovascular;  Laterality: N/A;  . COLONOSCOPY N/A 02/04/2016   Procedure: COLONOSCOPY;  Surgeon: Irene Shipper, MD;  Location: [facility];  Service: Endoscopy;  Laterality: N/A;  . NO PAST SURGERIES      Family History  Problem Relation Age of Onset  . Diabetes Mother   . Diabetes Father   . Cancer Maternal Grandmother   . Breast cancer Cousin    . Alzheimer's disease Paternal Grandmother     No Known Allergies  Outpatient Medications Prior to Visit  Medication Sig Dispense Refill  . terbinafine (LAMISIL AT) 1 % cream Apply 1 application topically 2 (two) times daily. 30 g 0  . atorvastatin (LIPITOR) 40 MG tablet Take 1 tablet (40 mg total) by mouth daily. 90 tablet 1  . carvedilol (COREG) 25 MG tablet Take 1 tablet (25 mg total) by mouth 2 (two) times daily. 180 tablet 1  . Insulin Glargine (BASAGLAR KWIKPEN) 100 UNIT/ML Inject 10 Units into the skin daily. 30 mL 1  . Insulin Pen Needle 29G X 12MM MISC 10 Units by Does not apply route daily. 100 each 0  . metFORMIN (GLUCOPHAGE) 500 MG tablet Take 2 tablets (1,000 mg total) by mouth 2 (two) times daily with a meal. 360 tablet 1  . Ipratropium-Albuterol (COMBIVENT) 20-100 MCG/ACT AERS respimat Inhale 1 puff into the lungs 4 (four) times daily. (Patient not taking: No sig reported) 4 g 0  . amLODipine (NORVASC) 10 MG tablet Take 1 tablet (10 mg total) by mouth daily. 90 tablet 1  . Blood Glucose Monitoring Suppl (TRUE METRIX METER) DEVI 1 each by Does not apply route 3 (three) times daily before meals. (Patient not taking: No sig reported) 1 Device 0  . glucose blood (TRUE METRIX BLOOD GLUCOSE TEST) test strip 3 times daily before meals (Patient not taking: No sig reported) 100 each 12  .  Insulin Pen Needle (TRUEPLUS PEN NEEDLES) 32G X 4 MM MISC Use as instructed with Victoza. (Patient not taking: No sig reported) 100 each 11  . lisinopril (ZESTRIL) 40 MG tablet Take 1 tablet (40 mg total) by mouth daily. (Patient not taking: Reported on 10/07/2020) 30 tablet 0  . TRUEplus Lancets 28G MISC 1 each by Does not apply route 3 (three) times daily before meals. (Patient not taking: No sig reported) 100 each 12   No facility-administered medications prior to visit.     ROS Review of Systems  Constitutional: Negative for activity change and appetite change.  HENT: Negative for sinus  pressure and sore throat.   Eyes: Negative for visual disturbance.  Respiratory: Negative for cough, chest tightness and shortness of breath.   Cardiovascular: Negative for chest pain and leg swelling.  Gastrointestinal: Negative for abdominal distention, abdominal pain, constipation and diarrhea.  Endocrine: Negative.   Genitourinary: Negative for dysuria.  Musculoskeletal: Negative for joint swelling and myalgias.  Skin: Negative for rash.  Allergic/Immunologic: Negative.   Neurological: Negative for weakness, light-headedness and numbness.  Psychiatric/Behavioral: Negative for dysphoric mood and suicidal ideas.    Objective:  BP 128/80   Pulse 83   Ht $R'5\' 9"'lX$  (1.753 m)   Wt (!) 359 lb 6.4 oz (163 kg)   SpO2 95%   BMI 53.07 kg/m   BP/Weight 10/07/2020 08/27/2020 40/03/6760  Systolic BP 950 932 -  Diastolic BP 80 73 -  Wt. (Lbs) 359.4 - 380  BMI 53.07 - 56.12      Physical Exam Constitutional:      Appearance: He is well-developed. He is obese.  Neck:     Vascular: No JVD.  Cardiovascular:     Rate and Rhythm: Normal rate.     Heart sounds: Normal heart sounds. No murmur heard.   Pulmonary:     Effort: Pulmonary effort is normal.     Breath sounds: Normal breath sounds. No wheezing or rales.  Chest:     Chest wall: No tenderness.  Abdominal:     General: Bowel sounds are normal. There is no distension.     Palpations: Abdomen is soft. There is no mass.     Tenderness: There is no abdominal tenderness.  Musculoskeletal:        General: Normal range of motion.     Right lower leg: No edema.     Left lower leg: No edema.  Neurological:     Mental Status: He is alert and oriented to person, place, and time.  Psychiatric:        Mood and Affect: Mood normal.     CMP Latest Ref Rng & Units 08/27/2020 08/26/2020 02/28/2020  Glucose 70 - 99 mg/dL 247(H) 339(H) 118(H)  BUN 6 - 20 mg/dL 41(H) 44(H) 16  Creatinine 0.61 - 1.24 mg/dL 1.88(H) 2.28(H) 1.22  Sodium 135 -  145 mmol/L 136 135 141  Potassium 3.5 - 5.1 mmol/L 4.8 5.2(H) 4.5  Chloride 98 - 111 mmol/L 108 105 106  CO2 22 - 32 mmol/L 18(L) 17(L) 21  Calcium 8.9 - 10.3 mg/dL 9.4 10.0 9.4  Total Protein 6.5 - 8.1 g/dL - 8.7(H) 7.0  Total Bilirubin 0.3 - 1.2 mg/dL - 0.9 <0.2  Alkaline Phos 38 - 126 U/L - 74 64  AST 15 - 41 U/L - 15 14  ALT 0 - 44 U/L - 31 23    Lipid Panel     Component Value Date/Time   CHOL 158 02/28/2020  0910   TRIG 135 02/28/2020 0910   HDL 40 02/28/2020 0910   CHOLHDL 3.5 06/29/2019 0005   VLDL 17 06/29/2019 0005   LDLCALC 94 02/28/2020 0910    CBC    Component Value Date/Time   WBC 7.0 08/26/2020 1749   RBC 4.86 08/26/2020 1749   HGB 13.7 08/26/2020 1749   HCT 40.5 08/26/2020 1749   PLT 245 08/26/2020 1749   MCV 83.3 08/26/2020 1749   MCH 28.2 08/26/2020 1749   MCHC 33.8 08/26/2020 1749   RDW 13.8 08/26/2020 1749   LYMPHSABS 1.8 07/02/2019 0018   MONOABS 0.8 07/02/2019 0018   EOSABS 0.0 07/02/2019 0018   BASOSABS 0.0 07/02/2019 0018    Lab Results  Component Value Date   HGBA1C 13.4 (A) 10/07/2020    Assessment & Plan:  1. Type 2 diabetes mellitus with other specified complication, with long-term current use of insulin (HCC) Uncontrolled with A1c of 13.4 which has trended up from 6.6 previously due to noncompliance He is at high risk of progression to DKA given elevated CBG of 575 which trended up to over 630 minutes after administration of NovoLog but improved to 578 after an additional 15 minutes.  Urinalysis negative for ketones Goal is less than 7.0 Counseled on Diabetic diet, my plate method, 976 minutes of moderate intensity exercise/week Blood sugar logs with fasting goals of 80-120 mg/dl, random of less than 180 and in the event of sugars less than 60 mg/dl or greater than 400 mg/dl encouraged to notify the clinic. Advised on the need for annual eye exams, annual foot exams, Pneumonia vaccine. Compliance has been emphasized I have refilled  his pen needles - POCT glycosylated hemoglobin (Hb A1C) - POCT glucose (manual entry) - atorvastatin (LIPITOR) 40 MG tablet; Take 1 tablet (40 mg total) by mouth daily.  Dispense: 90 tablet; Refill: 1 - Insulin Glargine (BASAGLAR KWIKPEN) 100 UNIT/ML; Inject 10 Units into the skin daily.  Dispense: 30 mL; Refill: 1 - metFORMIN (GLUCOPHAGE) 500 MG tablet; Take 2 tablets (1,000 mg total) by mouth 2 (two) times daily with a meal.  Dispense: 360 tablet; Refill: 1 - Microalbumin / creatinine urine ratio - CMP14+EGFR - Insulin Pen Needle (BD PEN NEEDLE NANO U/F) 32G X 4 MM MISC; 10 Units by Does not apply route daily.  Dispense: 100 each; Refill: 3 - insulin aspart (novoLOG) injection 25 Units - POCT glucose (manual entry) - POCT URINALYSIS DIP (CLINITEK)  2. Hypertension associated with diabetes ([place]) Controlled Counseled on blood pressure goal of less than 130/80, low-sodium, DASH diet, medication compliance, 150 minutes of moderate intensity exercise per week. Discussed medication compliance, adverse effects. - amLODipine (NORVASC) 10 MG tablet; Take 1 tablet (10 mg total) by mouth daily.  Dispense: 90 tablet; Refill: 1 - carvedilol (COREG) 25 MG tablet; Take 1 tablet (25 mg total) by mouth 2 (two) times daily.  Dispense: 180 tablet; Refill: 1  3. Screening for viral disease - HCV RNA quant rflx ultra or genotyp([facility])  4. Non compliance w medication regimen Compliance with medications has been emphasized He has been advised to contact the clinic if he runs out of any medications He has sent me 'my chart' messages in the past when he ran out so it is surprising that he never did so when he ran out of pen needles.     Meds ordered this encounter  Medications  . amLODipine (NORVASC) 10 MG tablet    Sig: Take 1 tablet (10 mg total) by mouth  daily.    Dispense:  90 tablet    Refill:  1  . atorvastatin (LIPITOR) 40 MG tablet    Sig: Take 1 tablet (40 mg total) by mouth daily.     Dispense:  90 tablet    Refill:  1  . carvedilol (COREG) 25 MG tablet    Sig: Take 1 tablet (25 mg total) by mouth 2 (two) times daily.    Dispense:  180 tablet    Refill:  1  . Insulin Glargine (BASAGLAR KWIKPEN) 100 UNIT/ML    Sig: Inject 10 Units into the skin daily.    Dispense:  30 mL    Refill:  1  . metFORMIN (GLUCOPHAGE) 500 MG tablet    Sig: Take 2 tablets (1,000 mg total) by mouth 2 (two) times daily with a meal.    Dispense:  360 tablet    Refill:  1  . Insulin Pen Needle (BD PEN NEEDLE NANO U/F) 32G X 4 MM MISC    Sig: 10 Units by Does not apply route daily.    Dispense:  100 each    Refill:  3  . insulin aspart (novoLOG) injection 25 Units         Charlott Rakes, MD, FAAFP. [facility] and [place], [place]   10/07/2020, 12:29 PM

## 2020-10-08 LAB — CMP14+EGFR
ALT: 30 IU/L (ref 0–44)
AST: 12 IU/L (ref 0–40)
Albumin/Globulin Ratio: 1.7 (ref 1.2–2.2)
Albumin: 4.8 g/dL (ref 4.0–5.0)
Alkaline Phosphatase: 90 IU/L (ref 44–121)
BUN/Creatinine Ratio: 12 (ref 9–20)
BUN: 19 mg/dL (ref 6–20)
Bilirubin Total: 0.4 mg/dL (ref 0.0–1.2)
CO2: 19 mmol/L — ABNORMAL LOW (ref 20–29)
Calcium: 10 mg/dL (ref 8.7–10.2)
Chloride: 92 mmol/L — ABNORMAL LOW (ref 96–106)
Creatinine, Ser: 1.62 mg/dL — ABNORMAL HIGH (ref 0.76–1.27)
GFR calc Af Amer: 62 mL/min/{1.73_m2} (ref >59)
GFR calc non Af Amer: 54 mL/min/{1.73_m2} — ABNORMAL LOW (ref >59)
Globulin, Total: 2.9 g/dL (ref 1.5–4.5)
Glucose: 662 mg/dL (ref 65–99)
Potassium: 5.1 mmol/L (ref 3.5–5.2)
Sodium: 129 mmol/L — ABNORMAL LOW (ref 134–144)
Total Protein: 7.7 g/dL (ref 6.0–8.5)

## 2020-10-08 LAB — MICROALBUMIN / CREATININE URINE RATIO
Creatinine, Urine: 74.4 mg/dL
Microalb/Creat Ratio: 77 mg/g creat — ABNORMAL HIGH (ref 0–29)
Microalbumin, Urine: 57 ug/mL

## 2020-10-08 LAB — HCV RNA QUANT RFLX ULTRA OR GENOTYP: HCV Quant Baseline: NOT DETECTED IU/mL

## 2020-10-24 ENCOUNTER — Encounter: Payer: Self-pay | Admitting: Family Medicine

## 2020-10-24 ENCOUNTER — Other Ambulatory Visit: Payer: Self-pay | Admitting: Family Medicine

## 2020-10-24 DIAGNOSIS — H538 Other visual disturbances: Secondary | ICD-10-CM

## 2021-01-13 ENCOUNTER — Other Ambulatory Visit: Payer: Self-pay | Admitting: Family Medicine

## 2021-01-13 DIAGNOSIS — E1169 Type 2 diabetes mellitus with other specified complication: Secondary | ICD-10-CM

## 2021-01-13 DIAGNOSIS — Z794 Long term (current) use of insulin: Secondary | ICD-10-CM

## 2021-01-14 ENCOUNTER — Other Ambulatory Visit: Payer: Self-pay | Admitting: Family Medicine

## 2021-01-14 DIAGNOSIS — I152 Hypertension secondary to endocrine disorders: Secondary | ICD-10-CM

## 2021-01-14 DIAGNOSIS — E1159 Type 2 diabetes mellitus with other circulatory complications: Secondary | ICD-10-CM

## 2021-03-02 ENCOUNTER — Encounter: Payer: Self-pay | Admitting: Family Medicine

## 2021-03-04 ENCOUNTER — Other Ambulatory Visit: Payer: Self-pay | Admitting: Family Medicine

## 2021-03-04 DIAGNOSIS — E1169 Type 2 diabetes mellitus with other specified complication: Secondary | ICD-10-CM

## 2021-03-04 MED ORDER — BASAGLAR KWIKPEN 100 UNIT/ML ~~LOC~~ SOPN: 10.0000 [IU] | PEN_INJECTOR | Freq: Every day | SUBCUTANEOUS | 1 refills | Status: DC

## 2021-03-13 ENCOUNTER — Encounter: Payer: Self-pay | Admitting: Family Medicine

## 2021-03-13 ENCOUNTER — Other Ambulatory Visit: Payer: Self-pay | Admitting: Internal Medicine

## 2021-03-13 DIAGNOSIS — E1169 Type 2 diabetes mellitus with other specified complication: Secondary | ICD-10-CM

## 2021-03-13 MED ORDER — LEVEMIR FLEXTOUCH 100 UNIT/ML ~~LOC~~ SOPN: 10.0000 [IU] | PEN_INJECTOR | Freq: Every day | SUBCUTANEOUS | 1 refills | Status: DC

## 2021-03-27 DIAGNOSIS — Z03818 Encounter for observation for suspected exposure to other biological agents ruled out: Secondary | ICD-10-CM | POA: Diagnosis not present

## 2021-03-27 DIAGNOSIS — I10 Essential (primary) hypertension: Secondary | ICD-10-CM | POA: Diagnosis not present

## 2021-03-27 DIAGNOSIS — Z20822 Contact with and (suspected) exposure to covid-19: Secondary | ICD-10-CM | POA: Diagnosis not present

## 2021-03-27 DIAGNOSIS — R059 Cough, unspecified: Secondary | ICD-10-CM | POA: Diagnosis not present

## 2021-04-05 ENCOUNTER — Other Ambulatory Visit: Payer: Self-pay | Admitting: Internal Medicine

## 2021-04-05 ENCOUNTER — Other Ambulatory Visit: Payer: Self-pay | Admitting: Family Medicine

## 2021-04-05 DIAGNOSIS — E1169 Type 2 diabetes mellitus with other specified complication: Secondary | ICD-10-CM

## 2021-04-05 DIAGNOSIS — E1159 Type 2 diabetes mellitus with other circulatory complications: Secondary | ICD-10-CM

## 2021-04-05 DIAGNOSIS — I152 Hypertension secondary to endocrine disorders: Secondary | ICD-10-CM

## 2021-04-05 NOTE — Telephone Encounter (Signed)
last RF 03/13/21 #15 1 RF

## 2021-04-07 ENCOUNTER — Ambulatory Visit: Payer: BC Managed Care – PPO | Admitting: Family Medicine

## 2021-04-07 MED ORDER — AMLODIPINE BESYLATE 10 MG PO TABS: 10.0000 mg | ORAL_TABLET | Freq: Every day | ORAL | 1 refills | Status: DC

## 2021-04-10 ENCOUNTER — Other Ambulatory Visit: Payer: Self-pay

## 2021-04-10 ENCOUNTER — Encounter: Payer: Self-pay | Admitting: Family Medicine

## 2021-04-10 ENCOUNTER — Other Ambulatory Visit: Payer: Self-pay | Admitting: Family Medicine

## 2021-04-10 ENCOUNTER — Ambulatory Visit: Payer: BC Managed Care – PPO | Attending: Family Medicine | Admitting: Family Medicine

## 2021-04-10 DIAGNOSIS — E1169 Type 2 diabetes mellitus with other specified complication: Secondary | ICD-10-CM

## 2021-04-10 DIAGNOSIS — E1159 Type 2 diabetes mellitus with other circulatory complications: Secondary | ICD-10-CM

## 2021-04-10 DIAGNOSIS — I152 Hypertension secondary to endocrine disorders: Secondary | ICD-10-CM

## 2021-04-10 DIAGNOSIS — Z794 Long term (current) use of insulin: Secondary | ICD-10-CM | POA: Diagnosis not present

## 2021-04-10 MED ORDER — LEVEMIR FLEXTOUCH 100 UNIT/ML ~~LOC~~ SOPN: 10.0000 [IU] | PEN_INJECTOR | Freq: Every day | SUBCUTANEOUS | 6 refills | Status: DC

## 2021-04-10 MED ORDER — VICTOZA 18 MG/3ML ~~LOC~~ SOPN: 0.6000 mg | PEN_INJECTOR | Freq: Every day | SUBCUTANEOUS | 6 refills | Status: DC

## 2021-04-10 MED ORDER — ATORVASTATIN CALCIUM 40 MG PO TABS: 40.0000 mg | ORAL_TABLET | Freq: Every day | ORAL | 1 refills | Status: DC

## 2021-04-10 MED ORDER — METFORMIN HCL 500 MG PO TABS: 1000.0000 mg | ORAL_TABLET | Freq: Two times a day (BID) | ORAL | 1 refills | Status: DC

## 2021-04-10 MED ORDER — CARVEDILOL 25 MG PO TABS: 25.0000 mg | ORAL_TABLET | Freq: Two times a day (BID) | ORAL | 1 refills | Status: DC

## 2021-04-10 NOTE — Telephone Encounter (Signed)
  Notes to clinic:  : Alternative Requested:NOT COVERED.   Requested Prescriptions  Pending Prescriptions Disp Refills   BYETTA 5 MCG PEN 5 MCG/0.02ML SOPN injection [Pharmacy Med Name: BYETTA 5 MCG DOSE PEN INJ]  0     Endocrinology:  Diabetes - GLP-1 Receptor Agonists - exenatide Failed - 04/10/2021 11:22 AM      Failed - HBA1C is between 0 and 7.9 and within 180 days    HbA1c, POC (controlled diabetic range)  Date Value Ref Range Status  10/07/2020 13.4 (A) 0.0 - 7.0 % Final          Failed - Cr in normal range and within 360 days    Creat  Date Value Ref Range Status  08/28/2016 0.92 0.60 - 1.35 mg/dL Final   Creatinine, Ser  Date Value Ref Range Status  10/07/2020 1.62 (H) 0.76 - 1.27 mg/dL Final   Creatinine, Urine  Date Value Ref Range Status  01/09/2016 361 20 - 370 mg/dL Final    Comment:    Result confirmed by automatic dilution. Result repeated and verified.           Passed - AA eGFR in normal range and within 360 days    GFR, Est African American  Date Value Ref Range Status  08/28/2016 >89 >=60 mL/min Final   GFR calc Af Amer  Date Value Ref Range Status  10/07/2020 62 >59 mL/min/1.73 Final    Comment:    **In accordance with recommendations from the NKF-ASN Task force,**   [facility] is in the process of updating its eGFR calculation to the   2021 CKD-EPI creatinine equation that estimates kidney function   without a race variable.    GFR, Est Non African American  Date Value Ref Range Status  08/28/2016 >89 >=60 mL/min Final   GFR, Estimated  Date Value Ref Range Status  08/27/2020 47 (L) >60 mL/min Final    Comment:    (NOTE) Calculated using the CKD-EPI Creatinine Equation (2021)    GFR calc non Af Amer  Date Value Ref Range Status  10/07/2020 54 (L) >59 mL/min/1.73 Final          Passed - Valid encounter within last 6 months    Recent Outpatient Visits           Today Morbid obesity [facility])   [place], [place], MD   6 months ago Type 2 diabetes mellitus with other specified complication, with long-term current use of insulin ([place])   [place], [place], MD   8 months ago Hypertension associated with diabetes [facility])   [place], Annie Main L, RPH-CPP   9 months ago Hypertension associated with diabetes [facility])   [place], Charlane Ferretti, MD   10 months ago Type 2 diabetes mellitus with other specified complication, without long-term current use of insulin [facility])   [place] [facility] Charlott Rakes, MD

## 2021-04-10 NOTE — Progress Notes (Signed)
Virtual Visit via Video Note  I connected with William Moss, on 04/10/2021 at 10:27 AM by video enabled telemedicine device due to the COVID-19 pandemic and verified that I am speaking with the correct person using two identifiers.   Consent: I discussed the limitations, risks, security and privacy concerns of performing an evaluation and management service by telemedicine and the availability of in person appointments. I also discussed with the patient that there may be a patient responsible charge related to this service. The patient expressed understanding and agreed to proceed.   Location of Patient: Home  Location of Provider: Clinic   Persons participating in Telemedicine visit: William Moss Dr. Margarita Rana     History of Present Illness: William Moss is a 35 y.o. year old male  with a history of hypertension, type 2 diabetes mellitus (A1c 13.4) , morbid obesity, GERD hospitalized for COVID-19 pneumonia in 06/2019 here for chronic disease management.   Fasting sugars have been 100-140 and he has been compliant with his Lantus and metformin.  Was previously on Victoza but states when he was hospitalized, Victoza was discontinued. He does not have a means of checking his blood pressures at home but endorses compliance with his antihypertensive. He has no additional concerns today.  Past Medical History:  Diagnosis Date   Atypical chest pain 05/27/2016   a. 05/2016: NST showing small area of moderare anteroapical ischemia b. 05/2016: cath showing tortous but normal cors which confirmed a false positive NST.   Diabetes mellitus without complication ([place])    GERD (gastroesophageal reflux disease) 10/02/2016   Hypertension    No Known Allergies  Current Outpatient Medications on File Prior to Visit  Medication Sig Dispense Refill   amLODipine (NORVASC) 10 MG tablet Take 1 tablet (10 mg total) by mouth daily. 90 tablet 1   atorvastatin (LIPITOR) 40 MG tablet Take 1 tablet (40 mg  total) by mouth daily. 90 tablet 1   Blood Glucose Monitoring Suppl (ONETOUCH VERIO REFLECT) w/Device KIT Check blood sugar TID E11.69 1 kit 0   carvedilol (COREG) 25 MG tablet Take 1 tablet (25 mg total) by mouth 2 (two) times daily. 180 tablet 1   glucose blood (ONETOUCH VERIO) test strip Check blood sugar TID E11.69 100 each 2   insulin detemir (LEVEMIR FLEXTOUCH) 100 UNIT/ML FlexPen Inject 10 Units into the skin at bedtime. 15 mL 1   Insulin Pen Needle (BD PEN NEEDLE NANO U/F) 32G X 4 MM MISC 10 Units by Does not apply route daily. 100 each 3   Ipratropium-Albuterol (COMBIVENT) 20-100 MCG/ACT AERS respimat Inhale 1 puff into the lungs 4 (four) times daily. (Patient not taking: No sig reported) 4 g 0   Lancets (ONETOUCH DELICA PLUS [id number]) MISC CHECK BLOOD SUGAR 3 TIMES A DAY 100 each 2   metFORMIN (GLUCOPHAGE) 500 MG tablet Take 2 tablets (1,000 mg total) by mouth 2 (two) times daily with a meal. 360 tablet 1   terbinafine (LAMISIL AT) 1 % cream Apply 1 application topically 2 (two) times daily. 30 g 0   [DISCONTINUED] lisinopril-hydrochlorothiazide (ZESTORETIC) 20-12.5 MG tablet Take 2 tablets by mouth daily. 180 tablet 1   No current facility-administered medications on file prior to visit.    ROS: See HPI  Observations/Objective: Awake, alert, ranted x3 Neck-no JVD Respiration-normal Extremities-F ROM Psych-normal  CMP Latest Ref Rng & Units 10/07/2020 08/27/2020 08/26/2020  Glucose 65 - 99 mg/dL 662(HH) 247(H) 339(H)  BUN 6 - 20 mg/dL 19 41(H) 44(H)  Creatinine  0.76 - 1.27 mg/dL 1.62(H) 1.88(H) 2.28(H)  Sodium 134 - 144 mmol/L 129(L) 136 135  Potassium 3.5 - 5.2 mmol/L 5.1 4.8 5.2(H)  Chloride 96 - 106 mmol/L 92(L) 108 105  CO2 20 - 29 mmol/L 19(L) 18(L) 17(L)  Calcium 8.7 - 10.2 mg/dL 10.0 9.4 10.0  Total Protein 6.0 - 8.5 g/dL 7.7 - 8.7(H)  Total Bilirubin 0.0 - 1.2 mg/dL 0.4 - 0.9  Alkaline Phos 44 - 121 IU/L 90 - 74  AST 0 - 40 IU/L 12 - 15  ALT 0 - 44 IU/L 30 - 31     Lipid Panel     Component Value Date/Time   CHOL 158 02/28/2020 0910   TRIG 135 02/28/2020 0910   HDL 40 02/28/2020 0910   CHOLHDL 3.5 06/29/2019 0005   VLDL 17 06/29/2019 0005   LDLCALC 94 02/28/2020 0910   LABVLDL 24 02/28/2020 0910    Lab Results  Component Value Date   HGBA1C 13.4 (A) 10/07/2020     Assessment and Plan: 1. Type 2 diabetes mellitus with other specified complication, with long-term current use of insulin (HCC) Uncontrolled with A1c of 13.4 Goal is less than 7.0 Blood sugars reveal improvement We will add on GLP-1 Counseled on Diabetic diet, my plate method, 150 minutes of moderate intensity exercise/week Blood sugar logs with fasting goals of 80-120 mg/dl, random of less than 180 and in the event of sugars less than 60 mg/dl or greater than 400 mg/dl encouraged to notify the clinic. Advised on the need for annual eye exams, annual foot exams, Pneumonia vaccine. - CMP14+EGFR; Future - Lipid panel; Future - Hemoglobin A1c; Future - atorvastatin (LIPITOR) 40 MG tablet; Take 1 tablet (40 mg total) by mouth daily.  Dispense: 90 tablet; Refill: 1 - insulin detemir (LEVEMIR FLEXTOUCH) 100 UNIT/ML FlexPen; Inject 10 Units into the skin at bedtime.  Dispense: 30 mL; Refill: 6 - liraglutide (VICTOZA) 18 MG/3ML SOPN; Inject 0.6 mg into the skin daily. For 1 week then increase to 1.2 mg daily for 1 week then increase to 1.8 mg daily thereafter  Dispense: 9 mL; Refill: 6 - metFORMIN (GLUCOPHAGE) 500 MG tablet; Take 2 tablets (1,000 mg total) by mouth 2 (two) times daily with a meal.  Dispense: 360 tablet; Refill: 1  2. Hypertension associated with diabetes (HCC) Advised to keep a log of blood pressures Counseled on blood pressure goal of less than 130/80, low-sodium, DASH diet, medication compliance, 150 minutes of moderate intensity exercise per week. Discussed medication compliance, adverse effects. - carvedilol (COREG) 25 MG tablet; Take 1 tablet (25 mg total) by  mouth 2 (two) times daily.  Dispense: 180 tablet; Refill: 1  3. Morbid obesity (HCC) Hopefully addition of GLP-1 will be beneficial He needs to work on caloric restriction and increased physical activity   Follow Up Instructions: 3 months   I discussed the assessment and treatment plan with the patient. The patient was provided an opportunity to ask questions and all were answered. The patient agreed with the plan and demonstrated an understanding of the instructions.   The patient was advised to call back or seek an in-person evaluation if the symptoms worsen or if the condition fails to improve as anticipated.     I provided 20 minutes total of Telehealth time during this encounter including median intraservice time, reviewing previous notes, investigations, ordering medications, medical decision making, coordinating care and patient verbalized understanding at the end of the visit.     Enobong Newlin, MD,   FAAFP. [facility] [place],  [place] [phone]   04/10/2021, 10:27 AM  

## 2021-04-11 NOTE — Telephone Encounter (Signed)
Pharmacy is requesting Byetta as Victoza is not covered by his insurance. Approval will have to be given by the provider.

## 2021-04-14 ENCOUNTER — Encounter: Payer: Self-pay | Admitting: Family Medicine

## 2021-04-17 ENCOUNTER — Ambulatory Visit: Payer: BC Managed Care – PPO | Attending: Family Medicine

## 2021-04-17 ENCOUNTER — Other Ambulatory Visit: Payer: Self-pay

## 2021-04-17 DIAGNOSIS — E1169 Type 2 diabetes mellitus with other specified complication: Secondary | ICD-10-CM

## 2021-04-17 DIAGNOSIS — Z794 Long term (current) use of insulin: Secondary | ICD-10-CM | POA: Diagnosis not present

## 2021-04-18 LAB — HEMOGLOBIN A1C
Est. average glucose Bld gHb Est-mCnc: 166 mg/dL
Hgb A1c MFr Bld: 7.4 % — ABNORMAL HIGH (ref 4.8–5.6)

## 2021-04-18 LAB — CMP14+EGFR
ALT: 16 IU/L (ref 0–44)
AST: 10 IU/L (ref 0–40)
Albumin/Globulin Ratio: 1.3 (ref 1.2–2.2)
Albumin: 4 g/dL (ref 4.0–5.0)
Alkaline Phosphatase: 80 IU/L (ref 44–121)
BUN/Creatinine Ratio: 15 (ref 9–20)
BUN: 21 mg/dL — ABNORMAL HIGH (ref 6–20)
Bilirubin Total: 0.2 mg/dL (ref 0.0–1.2)
CO2: 22 mmol/L (ref 20–29)
Calcium: 9 mg/dL (ref 8.7–10.2)
Chloride: 106 mmol/L (ref 96–106)
Creatinine, Ser: 1.37 mg/dL — ABNORMAL HIGH (ref 0.76–1.27)
Globulin, Total: 3 g/dL (ref 1.5–4.5)
Glucose: 159 mg/dL — ABNORMAL HIGH (ref 65–99)
Potassium: 4.5 mmol/L (ref 3.5–5.2)
Sodium: 141 mmol/L (ref 134–144)
Total Protein: 7 g/dL (ref 6.0–8.5)
eGFR: 69 mL/min/{1.73_m2} (ref >59)

## 2021-04-18 LAB — LIPID PANEL
Chol/HDL Ratio: 3.1 ratio (ref 0.0–5.0)
Cholesterol, Total: 133 mg/dL (ref 100–199)
HDL: 43 mg/dL (ref >39)
LDL Chol Calc (NIH): 69 mg/dL (ref 0–99)
Triglycerides: 118 mg/dL (ref 0–149)
VLDL Cholesterol Cal: 21 mg/dL (ref 5–40)

## 2021-06-07 ENCOUNTER — Encounter (HOSPITAL_COMMUNITY): Payer: Self-pay | Admitting: Emergency Medicine

## 2021-06-07 ENCOUNTER — Other Ambulatory Visit: Payer: Self-pay

## 2021-06-07 ENCOUNTER — Emergency Department (HOSPITAL_COMMUNITY): Payer: Self-pay

## 2021-06-07 ENCOUNTER — Inpatient Hospital Stay (HOSPITAL_COMMUNITY)
Admission: EM | Admit: 2021-06-07 | Discharge: 2021-06-09 | DRG: 193 | Disposition: A | Discharge status: Home / Self Care | Payer: Self-pay | Attending: Internal Medicine | Admitting: Internal Medicine

## 2021-06-07 DIAGNOSIS — R0600 Dyspnea, unspecified: Secondary | ICD-10-CM

## 2021-06-07 DIAGNOSIS — I129 Hypertensive chronic kidney disease with stage 1 through stage 4 chronic kidney disease, or unspecified chronic kidney disease: Secondary | ICD-10-CM | POA: Diagnosis present

## 2021-06-07 DIAGNOSIS — Z8616 Personal history of COVID-19: Secondary | ICD-10-CM

## 2021-06-07 DIAGNOSIS — I10 Essential (primary) hypertension: Secondary | ICD-10-CM | POA: Diagnosis present

## 2021-06-07 DIAGNOSIS — Z6841 Body Mass Index (BMI) 40.0 and over, adult: Secondary | ICD-10-CM

## 2021-06-07 DIAGNOSIS — J9601 Acute respiratory failure with hypoxia: Secondary | ICD-10-CM | POA: Diagnosis not present

## 2021-06-07 DIAGNOSIS — I160 Hypertensive urgency: Secondary | ICD-10-CM

## 2021-06-07 DIAGNOSIS — R0602 Shortness of breath: Secondary | ICD-10-CM | POA: Diagnosis not present

## 2021-06-07 DIAGNOSIS — Z79899 Other long term (current) drug therapy: Secondary | ICD-10-CM

## 2021-06-07 DIAGNOSIS — E7800 Pure hypercholesterolemia, unspecified: Secondary | ICD-10-CM | POA: Diagnosis present

## 2021-06-07 DIAGNOSIS — G4733 Obstructive sleep apnea (adult) (pediatric): Secondary | ICD-10-CM | POA: Diagnosis present

## 2021-06-07 DIAGNOSIS — N1831 Chronic kidney disease, stage 3a: Secondary | ICD-10-CM | POA: Diagnosis present

## 2021-06-07 DIAGNOSIS — Z20822 Contact with and (suspected) exposure to covid-19: Secondary | ICD-10-CM | POA: Diagnosis present

## 2021-06-07 DIAGNOSIS — R059 Cough, unspecified: Secondary | ICD-10-CM | POA: Diagnosis not present

## 2021-06-07 DIAGNOSIS — Z794 Long term (current) use of insulin: Secondary | ICD-10-CM

## 2021-06-07 DIAGNOSIS — E1169 Type 2 diabetes mellitus with other specified complication: Secondary | ICD-10-CM

## 2021-06-07 DIAGNOSIS — N182 Chronic kidney disease, stage 2 (mild): Secondary | ICD-10-CM | POA: Diagnosis present

## 2021-06-07 DIAGNOSIS — I517 Cardiomegaly: Secondary | ICD-10-CM | POA: Diagnosis not present

## 2021-06-07 DIAGNOSIS — J189 Pneumonia, unspecified organism: Principal | ICD-10-CM | POA: Diagnosis present

## 2021-06-07 DIAGNOSIS — Z7984 Long term (current) use of oral hypoglycemic drugs: Secondary | ICD-10-CM

## 2021-06-07 DIAGNOSIS — E1122 Type 2 diabetes mellitus with diabetic chronic kidney disease: Secondary | ICD-10-CM | POA: Diagnosis present

## 2021-06-07 DIAGNOSIS — Z820 Family history of epilepsy and other diseases of the nervous system: Secondary | ICD-10-CM

## 2021-06-07 DIAGNOSIS — Z833 Family history of diabetes mellitus: Secondary | ICD-10-CM

## 2021-06-07 DIAGNOSIS — E785 Hyperlipidemia, unspecified: Secondary | ICD-10-CM | POA: Diagnosis present

## 2021-06-07 LAB — CBC WITH DIFFERENTIAL/PLATELET
Abs Immature Granulocytes: 0.04 10*3/uL (ref 0.00–0.07)
Basophils Absolute: 0 10*3/uL (ref 0.0–0.1)
Basophils Relative: 0 %
Eosinophils Absolute: 0.3 10*3/uL (ref 0.0–0.5)
Eosinophils Relative: 3 %
HCT: 40.7 % (ref 39.0–52.0)
Hemoglobin: 13.1 g/dL (ref 13.0–17.0)
Immature Granulocytes: 0 %
Lymphocytes Relative: 22 %
Lymphs Abs: 2 10*3/uL (ref 0.7–4.0)
MCH: 26.7 pg (ref 26.0–34.0)
MCHC: 32.2 g/dL (ref 30.0–36.0)
MCV: 83.1 fL (ref 80.0–100.0)
Monocytes Absolute: 1 10*3/uL (ref 0.1–1.0)
Monocytes Relative: 11 %
Neutro Abs: 5.7 10*3/uL (ref 1.7–7.7)
Neutrophils Relative %: 64 %
Platelets: 200 10*3/uL (ref 150–400)
RBC: 4.9 MIL/uL (ref 4.22–5.81)
RDW: 14.8 % (ref 11.5–15.5)
WBC: 9 10*3/uL (ref 4.0–10.5)
nRBC: 0.2 % (ref 0.0–0.2)

## 2021-06-07 LAB — BASIC METABOLIC PANEL
Anion gap: 9 (ref 5–15)
BUN: 21 mg/dL — ABNORMAL HIGH (ref 6–20)
CO2: 19 mmol/L — ABNORMAL LOW (ref 22–32)
Calcium: 9.8 mg/dL (ref 8.9–10.3)
Chloride: 113 mmol/L — ABNORMAL HIGH (ref 98–111)
Creatinine, Ser: 1.55 mg/dL — ABNORMAL HIGH (ref 0.61–1.24)
GFR, Estimated: 59 mL/min — ABNORMAL LOW (ref >60)
Glucose, Bld: 414 mg/dL — ABNORMAL HIGH (ref 70–99)
Potassium: 4.4 mmol/L (ref 3.5–5.1)
Sodium: 141 mmol/L (ref 135–145)

## 2021-06-07 LAB — HEPATIC FUNCTION PANEL
ALT: 23 U/L (ref 0–44)
AST: 13 U/L — ABNORMAL LOW (ref 15–41)
Albumin: 4.1 g/dL (ref 3.5–5.0)
Alkaline Phosphatase: 80 U/L (ref 38–126)
Bilirubin, Direct: <0.1 mg/dL (ref 0.0–0.2)
Total Bilirubin: 0.5 mg/dL (ref 0.3–1.2)
Total Protein: 8.5 g/dL — ABNORMAL HIGH (ref 6.5–8.1)

## 2021-06-07 LAB — RESP PANEL BY RT-PCR (FLU A&B, COVID) ARPGX2
Influenza A by PCR: NEGATIVE
Influenza B by PCR: NEGATIVE
SARS Coronavirus 2 by RT PCR: NEGATIVE

## 2021-06-07 LAB — D-DIMER, QUANTITATIVE: D-Dimer, Quant: 0.8 ug/mL-FEU — ABNORMAL HIGH (ref 0.00–0.50)

## 2021-06-07 LAB — TROPONIN I (HIGH SENSITIVITY): Troponin I (High Sensitivity): 9 ng/L (ref <18)

## 2021-06-07 LAB — CBG MONITORING, ED
Glucose-Capillary: 282 mg/dL — ABNORMAL HIGH (ref 70–99)
Glucose-Capillary: 262 mg/dL — ABNORMAL HIGH (ref 70–99)

## 2021-06-07 LAB — LIPASE, BLOOD: Lipase: 28 U/L (ref 11–51)

## 2021-06-07 LAB — PROCALCITONIN: Procalcitonin: <0.1 ng/mL

## 2021-06-07 LAB — BRAIN NATRIURETIC PEPTIDE: B Natriuretic Peptide: 26.6 pg/mL (ref 0.0–100.0)

## 2021-06-07 MED ORDER — IOHEXOL 350 MG/ML SOLN
80.0000 mL | Freq: Once | INTRAVENOUS | Status: AC | PRN
  Administered 2021-06-07: 80 mL via INTRAVENOUS

## 2021-06-07 MED ORDER — SODIUM CHLORIDE 0.9 % IV SOLN
500.0000 mg | Freq: Once | INTRAVENOUS | Status: AC
  Administered 2021-06-07: 500 mg via INTRAVENOUS
  Filled 2021-06-07: qty 500

## 2021-06-07 MED ORDER — ENOXAPARIN SODIUM 40 MG/0.4ML IJ SOSY
40.0000 mg | PREFILLED_SYRINGE | INTRAMUSCULAR | Status: DC
  Administered 2021-06-07 – 2021-06-08 (×2): 40 mg via SUBCUTANEOUS
  Filled 2021-06-07 (×2): qty 0.4

## 2021-06-07 MED ORDER — AMLODIPINE BESYLATE 10 MG PO TABS
10.0000 mg | ORAL_TABLET | Freq: Every day | ORAL | Status: DC
  Administered 2021-06-08 – 2021-06-09 (×2): 10 mg via ORAL
  Filled 2021-06-07: qty 1
  Filled 2021-06-07: qty 2

## 2021-06-07 MED ORDER — SODIUM CHLORIDE 0.9 % IV SOLN
1.0000 g | Freq: Once | INTRAVENOUS | Status: AC
  Administered 2021-06-07: 1 g via INTRAVENOUS
  Filled 2021-06-07: qty 10

## 2021-06-07 MED ORDER — AZITHROMYCIN 500 MG IV SOLR
500.0000 mg | INTRAVENOUS | Status: DC
  Administered 2021-06-08: 500 mg via INTRAVENOUS
  Filled 2021-06-07: qty 500

## 2021-06-07 MED ORDER — INSULIN ASPART 100 UNIT/ML IJ SOLN
0.0000 [IU] | Freq: Three times a day (TID) | INTRAMUSCULAR | Status: DC
  Administered 2021-06-08: 7 [IU] via SUBCUTANEOUS
  Administered 2021-06-08: 5 [IU] via SUBCUTANEOUS
  Administered 2021-06-08: 7 [IU] via SUBCUTANEOUS
  Administered 2021-06-09: 9 [IU] via SUBCUTANEOUS
  Filled 2021-06-07: qty 0.09

## 2021-06-07 MED ORDER — ATORVASTATIN CALCIUM 40 MG PO TABS
40.0000 mg | ORAL_TABLET | Freq: Every day | ORAL | Status: DC
  Administered 2021-06-08 – 2021-06-09 (×2): 40 mg via ORAL
  Filled 2021-06-07 (×2): qty 1

## 2021-06-07 MED ORDER — INSULIN DETEMIR 100 UNIT/ML ~~LOC~~ SOLN
10.0000 [IU] | Freq: Every day | SUBCUTANEOUS | Status: DC
  Administered 2021-06-07 – 2021-06-08 (×2): 10 [IU] via SUBCUTANEOUS
  Filled 2021-06-07 (×2): qty 0.1

## 2021-06-07 MED ORDER — INSULIN ASPART 100 UNIT/ML IJ SOLN
0.0000 [IU] | Freq: Every day | INTRAMUSCULAR | Status: DC
Start: 2021-06-07 — End: 2021-06-09
  Administered 2021-06-07: 3 [IU] via SUBCUTANEOUS
  Administered 2021-06-08: 4 [IU] via SUBCUTANEOUS
  Filled 2021-06-07: qty 0.05

## 2021-06-07 MED ORDER — CARVEDILOL 25 MG PO TABS
25.0000 mg | ORAL_TABLET | Freq: Two times a day (BID) | ORAL | Status: DC
  Administered 2021-06-07 – 2021-06-09 (×4): 25 mg via ORAL
  Filled 2021-06-07 (×2): qty 1
  Filled 2021-06-07 (×2): qty 2

## 2021-06-07 MED ORDER — SODIUM CHLORIDE 0.9 % IV SOLN
2.0000 g | INTRAVENOUS | Status: DC
  Administered 2021-06-08: 2 g via INTRAVENOUS
  Filled 2021-06-07: qty 20

## 2021-06-07 NOTE — ED Provider Notes (Signed)
Emergency Medicine Provider Triage Evaluation Note  KIARA KEEP , a 35 y.o. male  was evaluated in triage.  Pt complains of sob.  Review of Systems  Positive: Sob, cough, congestion Negative: Fever, cp, hemoptysis  Physical Exam  There were no vitals taken for this visit. Gen:   Awake, no distress   Resp:  Normal effort  MSK:   Moves extremities without difficulty  Other:  Wearing supplemental O2, speaking in complete sentences, lungs CTAB  Medical Decision Making  Medically screening exam initiated at 5:32 PM.  Appropriate orders placed.  ANUAR WALGREN was informed that the remainder of the evaluation will be completed by another provider, this initial triage assessment does not replace that evaluation, and the importance of remaining in the ED until their evaluation is complete.  Has had sob, cough and congestion x 5 days.  Seen at [facility] today with neg covid test but was found to have hypoxia.  O2 sats 87 on RA, supplemental O2 given.    Fayrene Helper, PA-C 06/07/21 1734    Long, Arlyss Repress, MD 06/09/21 [phone]

## 2021-06-07 NOTE — H&P (Signed)
History and Physical    William Moss JXB:[id number] DOB: Jan 28, 1986 DOA: 06/07/2021  PCP: Charlott Rakes, MD   Patient coming from: Home   Chief Complaint: SOB, cough   HPI: William Moss is a 35 y.o. male with medical history significant for hypertension, type 2 diabetes mellitus, chronic renal insufficiency, and BMI 53, now presenting to the emergency department with cough and shortness of breath.  Patient reports that he was in his usual state until he developed exertional dyspnea approximately 4 days ago.  He went on to develop shortness of breath even at rest as well as a cough productive of thick yellow sputum.  He denies any rhinorrhea, sore throat, fevers, chills, chest pain, or leg swelling associated with this.  Denies sick contacts.  States that he was hospitalized 2 years ago with hypoxia related to COVID-19, but denies any chronic pulmonary disease or smoking.  He was initially evaluated at an urgent care where he was found to be negative for COVID-19 but dyspneic and hypoxic, and he was directed to the nearest ED.  ED Course: Upon arrival to the ED, patient is found to be afebrile, saturating 85% on room air, mildly tachypneic, and with stable blood pressure.  Chemistry panel notable for glucose 114 and creatinine 1.55.  CBC unremarkable.  Troponin and BNP normal.  COVID and influenza PCR negative.  D-dimer slightly elevated 0.8.  CTA chest is negative for PE but concerning for multifocal bilateral airspace disease consistent with pneumonia.  Patient was treated with supplemental oxygen, Rocephin, and azithromycin in the ED.  Review of Systems:  All other systems reviewed and apart from HPI, are negative.  Past Medical History:  Diagnosis Date   Atypical chest pain 05/27/2016   a. 05/2016: NST showing small area of moderare anteroapical ischemia b. 05/2016: cath showing tortous but normal cors which confirmed a false positive NST.   Diabetes mellitus without complication (HCC)     GERD (gastroesophageal reflux disease) 10/02/2016   Hypertension     Past Surgical History:  Procedure Laterality Date   CARDIAC CATHETERIZATION N/A 06/10/2016   Procedure: Left Heart Cath and Coronary Angiography;  Surgeon: Wellington Hampshire, MD;  Location: [facility];  Service: Cardiovascular;  Laterality: N/A;   COLONOSCOPY N/A 02/04/2016   Procedure: COLONOSCOPY;  Surgeon: Irene Shipper, MD;  Location: [facility];  Service: Endoscopy;  Laterality: N/A;   NO PAST SURGERIES      Social History:   reports that he has never smoked. He has never used smokeless tobacco. He reports that he does not drink alcohol and does not use drugs.  No Known Allergies  Family History  Problem Relation Age of Onset   Diabetes Mother    Diabetes Father    Cancer Maternal Grandmother    Breast cancer Cousin    Alzheimer's disease Paternal Grandmother      Prior to Admission medications   Medication Sig Start Date End Date Taking? Authorizing Provider  amLODipine (NORVASC) 10 MG tablet Take 1 tablet (10 mg total) by mouth daily. 04/07/21 07/06/21  Charlott Rakes, MD  atorvastatin (LIPITOR) 40 MG tablet Take 1 tablet (40 mg total) by mouth daily. 04/10/21   Charlott Rakes, MD  Blood Glucose Monitoring Suppl (ONETOUCH VERIO REFLECT) w/Device KIT Check blood sugar TID E11.69 10/07/20   Charlott Rakes, MD  carvedilol (COREG) 25 MG tablet Take 1 tablet (25 mg total) by mouth 2 (two) times daily. 04/10/21   Charlott Rakes, MD  exenatide (  BYETTA 5 MCG PEN) 5 MCG/0.02ML SOPN injection Inject 5 mcg into the skin 2 (two) times daily with a meal. 04/11/21   Charlott Rakes, MD  glucose blood (ONETOUCH VERIO) test strip Check blood sugar TID E11.69 10/07/20   Charlott Rakes, MD  insulin detemir (LEVEMIR FLEXTOUCH) 100 UNIT/ML FlexPen Inject 10 Units into the skin at bedtime. 04/10/21   Charlott Rakes, MD  Insulin Pen Needle (BD PEN NEEDLE NANO U/F) 32G X 4 MM MISC 10 Units by Does not apply route daily.  10/07/20   Charlott Rakes, MD  Ipratropium-Albuterol (COMBIVENT) 20-100 MCG/ACT AERS respimat Inhale 1 puff into the lungs 4 (four) times daily. 07/02/19   Allie Bossier, MD  Lancets [facility] DELICA PLUS [id number]) MISC CHECK BLOOD SUGAR 3 TIMES A DAY 01/13/21   Charlott Rakes, MD  metFORMIN (GLUCOPHAGE) 500 MG tablet Take 2 tablets (1,000 mg total) by mouth 2 (two) times daily with a meal. 04/10/21   Charlott Rakes, MD  terbinafine (LAMISIL AT) 1 % cream Apply 1 application topically 2 (two) times daily. 05/30/20   Charlott Rakes, MD  lisinopril-hydrochlorothiazide (ZESTORETIC) 20-12.5 MG tablet Take 2 tablets by mouth daily. 07/22/20 08/27/20  Charlott Rakes, MD    Physical Exam: Vitals:   06/07/21 1930 06/07/21 1932 06/07/21 2000 06/07/21 2055  BP: (!) 150/97  (!) 158/93 (!) 156/97  Pulse: 90  99 90  Resp: (!) 21  20 19   Temp:      TempSrc:      SpO2: (!) 88% (!) 85% 94% 96%    Constitutional: NAD, calm  Eyes: PERTLA, lids and conjunctivae normal ENMT: Mucous membranes are moist. Posterior pharynx clear of any exudate or lesions.   Neck:  supple, no masses  Respiratory: Dyspneic with speech. Mild tachypnea. No cyanosis.  Cardiovascular: S1 & S2 heard, regular rate and rhythm. No extremity edema.  Abdomen: No distension, no tenderness, soft. Bowel sounds active.  Musculoskeletal: no clubbing / cyanosis. No joint deformity upper and lower extremities.   Skin: no significant rashes, lesions, ulcers. Warm, dry, well-perfused. Xerosis.  Neurologic: CN 2-12 grossly intact. Moving all extremities. Alert and oriented.  Psychiatric: Very pleasant. Cooperative.    Labs and Imaging on Admission: I have personally reviewed following labs and imaging studies  CBC: Recent Labs  Lab 06/07/21 1923  WBC 9.0  NEUTROABS 5.7  HGB 13.1  HCT 40.7  MCV 83.1  PLT 465   Basic Metabolic Panel: Recent Labs  Lab 06/07/21 1732  NA 141  K 4.4  CL 113*  CO2 19*  GLUCOSE 414*  BUN 21*   CREATININE 1.55*  CALCIUM 9.8   GFR: CrCl cannot be calculated (Unknown ideal weight.). Liver Function Tests: Recent Labs  Lab 06/07/21 1732  AST 13*  ALT 23  ALKPHOS 80  BILITOT 0.5  PROT 8.5*  ALBUMIN 4.1   Recent Labs  Lab 06/07/21 1732  LIPASE 28   No results for input(s): AMMONIA in the last 168 hours. Coagulation Profile: No results for input(s): INR, PROTIME in the last 168 hours. Cardiac Enzymes: No results for input(s): CKTOTAL, CKMB, CKMBINDEX, TROPONINI in the last 168 hours. BNP (last 3 results) No results for input(s): PROBNP in the last 8760 hours. HbA1C: No results for input(s): HGBA1C in the last 72 hours. CBG: No results for input(s): GLUCAP in the last 168 hours. Lipid Profile: No results for input(s): CHOL, HDL, LDLCALC, TRIG, CHOLHDL, LDLDIRECT in the last 72 hours. Thyroid Function Tests: No results for input(s): TSH, T4TOTAL,  FREET4, T3FREE, THYROIDAB in the last 72 hours. Anemia Panel: No results for input(s): VITAMINB12, FOLATE, FERRITIN, TIBC, IRON, RETICCTPCT in the last 72 hours. Urine analysis:    Component Value Date/Time   COLORURINE STRAW (A) 01/18/2019 1840   APPEARANCEUR CLEAR 01/18/2019 1840   LABSPEC 1.021 01/18/2019 1840   PHURINE 5.0 01/18/2019 1840   GLUCOSEU >=500 (A) 01/18/2019 1840   HGBUR NEGATIVE 01/18/2019 1840   BILIRUBINUR negative 10/07/2020 1057   KETONESUR negative 10/07/2020 [place] 01/18/2019 1840   PROTEINUR NEGATIVE 01/18/2019 1840   UROBILINOGEN 0.2 10/07/2020 1057   NITRITE Negative 10/07/2020 1057   NITRITE NEGATIVE 01/18/2019 1840   LEUKOCYTESUR Negative 10/07/2020 1057   LEUKOCYTESUR NEGATIVE 01/18/2019 1840   Sepsis Labs: @LABRCNTIP (procalcitonin:4,lacticidven:4) ) Recent Results (from the past 240 hour(s))  Resp Panel by RT-PCR (Flu A&B, Covid) Nasopharyngeal Swab     Status: None   Collection Time: 06/07/21  6:58 PM   Specimen: Nasopharyngeal Swab; Nasopharyngeal(NP) swabs  in vial transport medium  Result Value Ref Range Status   SARS Coronavirus 2 by RT PCR NEGATIVE NEGATIVE Final    Comment: (NOTE) SARS-CoV-2 target nucleic acids are NOT DETECTED.  The SARS-CoV-2 RNA is generally detectable in upper respiratory specimens during the acute phase of infection. The lowest concentration of SARS-CoV-2 viral copies this assay can detect is 138 copies/mL. A negative result does not preclude SARS-Cov-2 infection and should not be used as the sole basis for treatment or other patient management decisions. A negative result may occur with  improper specimen collection/handling, submission of specimen other than nasopharyngeal swab, presence of viral mutation(s) within the areas targeted by this assay, and inadequate number of viral copies(<138 copies/mL). A negative result must be combined with clinical observations, patient history, and epidemiological information. The expected result is Negative.  Fact Sheet for Patients:  EntrepreneurPulse.com.au  Fact Sheet for Healthcare Providers:  IncredibleEmployment.be  This test is no t yet approved or cleared by the [place] FDA and  has been authorized for detection and/or diagnosis of SARS-CoV-2 by FDA under an Emergency Use Authorization (EUA). This EUA will remain  in effect (meaning this test can be used) for the duration of the COVID-19 declaration under Section 564(b)(1) of the Act, 21 U.S.C.section 360bbb-3(b)(1), unless the authorization is terminated  or revoked sooner.       Influenza A by PCR NEGATIVE NEGATIVE Final   Influenza B by PCR NEGATIVE NEGATIVE Final    Comment: (NOTE) The Xpert Xpress SARS-CoV-2/FLU/RSV plus assay is intended as an aid in the diagnosis of influenza from Nasopharyngeal swab specimens and should not be used as a sole basis for treatment. Nasal washings and aspirates are unacceptable for Xpert Xpress  SARS-CoV-2/FLU/RSV testing.  Fact Sheet for Patients: EntrepreneurPulse.com.au  Fact Sheet for Healthcare Providers: IncredibleEmployment.be  This test is not yet approved or cleared by the [place] FDA and has been authorized for detection and/or diagnosis of SARS-CoV-2 by FDA under an Emergency Use Authorization (EUA). This EUA will remain in effect (meaning this test can be used) for the duration of the COVID-19 declaration under Section 564(b)(1) of the Act, 21 U.S.C. section 360bbb-3(b)(1), unless the authorization is terminated or revoked.  Performed at [facility], [place] [address] [place], [place] [address]      Radiological Exams on Admission: DG Chest 2 View  Result Date: 06/07/2021 CLINICAL DATA:  Short of breath, cough and congestion for 5 days EXAM: CHEST - 2 VIEW COMPARISON:  06/27/2019 FINDINGS: Frontal and lateral views of the chest demonstrate an unremarkable cardiac silhouette. No acute airspace disease, effusion, or pneumothorax. No acute bony abnormalities. IMPRESSION: 1. No acute intrathoracic process. Electronically Signed   By: Randa Ngo M.D.   On: 06/07/2021 18:36   CT Angio Chest Pulmonary Embolism (PE) W or WO Contrast  Result Date: 06/07/2021 CLINICAL DATA:  Cough, short of breath for 5 days, positive D dimer EXAM: CT ANGIOGRAPHY CHEST WITH CONTRAST TECHNIQUE: Multidetector CT imaging of the chest was performed using the standard protocol during bolus administration of intravenous contrast. Multiplanar CT image reconstructions and MIPs were obtained to evaluate the vascular anatomy. CONTRAST:  26m OMNIPAQUE IOHEXOL 350 MG/ML SOLN COMPARISON:  06/07/2021 FINDINGS: Cardiovascular: There is technically adequate opacification of the pulmonary vasculature. Evaluation is limited due to body habitus. There are no central or segmental pulmonary emboli. Subsegmental branches are difficult to evaluate due  to body habitus. Mild cardiomegaly without pericardial effusion. No evidence of thoracic aortic aneurysm or dissection. Mediastinum/Nodes: No enlarged mediastinal, hilar, or axillary lymph nodes. Thyroid gland, trachea, and esophagus demonstrate no significant findings. Lungs/Pleura: Bilateral airspace disease is identified, most pronounced in the upper lobes and left lower lobe, most consistent with multifocal pneumonia. No effusion or pneumothorax. Central airways are patent. Upper Abdomen: Marked hepatic steatosis. No acute upper abdominal finding. Musculoskeletal: No acute or destructive bony lesions. Reconstructed images demonstrate no additional findings. Review of the MIP images confirms the above findings. IMPRESSION: 1. No evidence of pulmonary embolus. 2. Multifocal bilateral airspace disease consistent with pneumonia. 3. Cardiomegaly. Electronically Signed   By: MRanda NgoM.D.   On: 06/07/2021 20:02    EKG: Independently reviewed. Sinus rhythm.   Assessment/Plan   1. Pneumonia; acute hypoxic respiratory failure  - Presents with 4 days of SOB and 2 days of productive cough and is found to have new 2 Lpm supplemental O2 requirement and CT findings consistent with multifocal pneumonia  - He was started on Rocephin and azithromycin in ED  - Continue Rocephin and azithromycin, continue supplemental O2 as needed, supportive care    2. Insulin-dependent DM  - A1c was 7.4% in August 2022  - Continue CBG checks and insulin    3. Hypertension  - Continue Norvasc and Coreg    4. Renal insufficiency  - SCr is 1.55 on admission, up from 1.37 in August 2022 - Not clear whether CKD II or IIIa  - Renally-dose medications, monitor     DVT prophylaxis: Lovenox  Code Status: Full  Level of Care: Level of care: Med-Surg Family Communication: none present  Disposition Plan:  Patient is from: Home  Anticipated d/c is to: Home  Anticipated d/c date is: 10/10 or 06/10/21  Patient currently:  Pending improvement in respiratory status  Consults called: none  Admission status: Inpatient     TVianne Bulls MD [facility]  06/07/2021, 9:04 PM

## 2021-06-07 NOTE — ED Notes (Signed)
Pt placed on O2 via [place] at 2L, sating 95%

## 2021-06-07 NOTE — ED Triage Notes (Signed)
Pt c/o shob and productive cough x 2 days. Was sent by UC.hypoxic in triage. Tested negative for covid today. Denies fevers, n/v, cp

## 2021-06-07 NOTE — ED Provider Notes (Signed)
[facility] DEPT Provider Note   CSN: [id number] Arrival date & time: 06/07/21  1645     History Chief Complaint  Patient presents with   Shortness of Breath   Cough    William Moss is a 35 y.o. male who presents after being evaluated at urgent care earlier in the day due to progressive shortness of breath.  Patient states that earlier in the week he had left calf pain that did go away however he began experiencing shortness of breath a few days later.  He does not recall correlation between the relief of leg pain and onset of shortness of breath.  He states that he does not have any recent sick contacts and that he has not had any recent periods of immobilization or surgeries.  He does not experience chest pain in general or when taking deep breaths.  He does endorse possible hemoptysis.  He states that his dyspnea is worse with exertion.  Patient denies fever, chills, unexpected weight change.  He does state that he has noticed abnormal sounds when he breathes.  He does say that he typically sleeps with his head elevated for breathing purposes and may have been requiring further elevation of the last several days while he sleeps however he is not sure.  Patient denies known family history of bleeding or clotting disorders however does state that his father died from complications of pulmonary embolism in 2012 after surgery.     Past Medical History:  Diagnosis Date   Atypical chest pain 05/27/2016   a. 05/2016: NST showing small area of moderare anteroapical ischemia b. 05/2016: cath showing tortous but normal cors which confirmed a false positive NST.   Diabetes mellitus without complication ([place])    GERD (gastroesophageal reflux disease) 10/02/2016   Hypertension     Patient Active Problem List   Diagnosis Date Noted   Pneumonia 06/07/2021   AKI (acute kidney injury) ([place]) 08/27/2020   Insulin dependent type 2 diabetes mellitus ([place]) 62/56/3893    Metabolic syndrome 73/42/8768   Diabetes mellitus due to underlying condition, controlled, with complication, without long-term current use of insulin ([place])    Pure hypercholesterolemia    Morbid obesity with BMI of 60.0-69.9, adult ([place])    Pneumonia due to COVID-19 virus 06/28/2019   Diabetes mellitus type 2, uncontrolled, with complications 11/57/2620   Sinus tachycardia 06/27/2019   COVID-19 virus infection 06/27/2019   Sleep apnea 03/27/2019   Normal coronary arteries 03/27/2019   GERD (gastroesophageal reflux disease) 10/02/2016   Coronary artery disease involving native heart    Abnormal nuclear stress test    Atypical chest pain 05/27/2016   Non-insulin dependent type 2 diabetes mellitus ([place]) 09/30/2015   Hematochezia 09/30/2015   Essential hypertension 07/29/2015   Morbid obesity ([place]) 07/29/2015    Past Surgical History:  Procedure Laterality Date   CARDIAC CATHETERIZATION N/A 06/10/2016   Procedure: Left Heart Cath and Coronary Angiography;  Surgeon: Wellington Hampshire, MD;  Location: [facility];  Service: Cardiovascular;  Laterality: N/A;   COLONOSCOPY N/A 02/04/2016   Procedure: COLONOSCOPY;  Surgeon: Irene Shipper, MD;  Location: [facility];  Service: Endoscopy;  Laterality: N/A;   NO PAST SURGERIES         Family History  Problem Relation Age of Onset   Diabetes Mother    Diabetes Father    Cancer Maternal Grandmother    Breast cancer Cousin    Alzheimer's disease Paternal Grandmother  Social History   Tobacco Use   Smoking status: Never   Smokeless tobacco: Never  Substance Use Topics   Alcohol use: No   Drug use: No    Home Medications Prior to Admission medications   Medication Sig Start Date End Date Taking? Authorizing Provider  amLODipine (NORVASC) 10 MG tablet Take 1 tablet (10 mg total) by mouth daily. 04/07/21 07/06/21  Charlott Rakes, MD  atorvastatin (LIPITOR) 40 MG tablet Take 1 tablet (40 mg total) by mouth daily. 04/10/21    Charlott Rakes, MD  Blood Glucose Monitoring Suppl (ONETOUCH VERIO REFLECT) w/Device KIT Check blood sugar TID E11.69 10/07/20   Charlott Rakes, MD  carvedilol (COREG) 25 MG tablet Take 1 tablet (25 mg total) by mouth 2 (two) times daily. 04/10/21   Charlott Rakes, MD  exenatide (BYETTA 5 MCG PEN) 5 MCG/0.02ML SOPN injection Inject 5 mcg into the skin 2 (two) times daily with a meal. 04/11/21   Charlott Rakes, MD  glucose blood (ONETOUCH VERIO) test strip Check blood sugar TID E11.69 10/07/20   Charlott Rakes, MD  insulin detemir (LEVEMIR FLEXTOUCH) 100 UNIT/ML FlexPen Inject 10 Units into the skin at bedtime. 04/10/21   Charlott Rakes, MD  Insulin Pen Needle (BD PEN NEEDLE NANO U/F) 32G X 4 MM MISC 10 Units by Does not apply route daily. 10/07/20   Charlott Rakes, MD  Ipratropium-Albuterol (COMBIVENT) 20-100 MCG/ACT AERS respimat Inhale 1 puff into the lungs 4 (four) times daily. 07/02/19   Allie Bossier, MD  Lancets [facility] DELICA PLUS [id number]) MISC CHECK BLOOD SUGAR 3 TIMES A DAY 01/13/21   Charlott Rakes, MD  metFORMIN (GLUCOPHAGE) 500 MG tablet Take 2 tablets (1,000 mg total) by mouth 2 (two) times daily with a meal. 04/10/21   Charlott Rakes, MD  terbinafine (LAMISIL AT) 1 % cream Apply 1 application topically 2 (two) times daily. 05/30/20   Charlott Rakes, MD  lisinopril-hydrochlorothiazide (ZESTORETIC) 20-12.5 MG tablet Take 2 tablets by mouth daily. 07/22/20 08/27/20  Charlott Rakes, MD    Allergies    Patient has no known allergies.  Review of Systems   Review of Systems  Constitutional:  Negative for chills, diaphoresis and fever.  HENT:  Positive for congestion.   Respiratory:  Positive for cough and shortness of breath. Negative for chest tightness.   Cardiovascular:  Positive for leg swelling. Negative for chest pain.  Gastrointestinal:  Negative for abdominal pain, constipation and diarrhea.  Genitourinary:  Negative for dysuria.  Psychiatric/Behavioral:  The patient is  nervous/anxious.    Physical Exam Updated Vital Signs BP (!) 158/93   Pulse 99   Temp 99.6 F (37.6 C) (Oral)   Resp 20   SpO2 94%   Constitutional: Patient woman laying in bed.  No acute distress noted. Cardio: Regular rate and rhythm.  No murmurs, rubs, gallops. Pulm: Diffuse expiratory crackles noted.  Patient with poor O2 sats on room air (86-92%).  Increased work of breathing especially when speaking. MSK: Bilateral lower extremity 1+ pitting edema.  Lower extremities are nontender to palpation.  Calf circumferences appear to be equal. Skin: Skin is warm and dry. Neuro: Alert and oriented x3.  No focal deficit noted. Psych: Normal mood with anxious affect.  ED Results / Procedures / Treatments   Labs (all labs ordered are listed, but only abnormal results are displayed) Labs Reviewed  BASIC METABOLIC PANEL - Abnormal; Notable for the following components:      Result Value   Chloride 113 (*)  CO2 19 (*)    Glucose, Bld 414 (*)    BUN 21 (*)    Creatinine, Ser 1.55 (*)    GFR, Estimated 59 (*)    All other components within normal limits  D-DIMER, QUANTITATIVE - Abnormal; Notable for the following components:   D-Dimer, Quant 0.80 (*)    All other components within normal limits  HEPATIC FUNCTION PANEL - Abnormal; Notable for the following components:   Total Protein 8.5 (*)    AST 13 (*)    All other components within normal limits  RESP PANEL BY RT-PCR (FLU A&B, COVID) ARPGX2  LIPASE, BLOOD  CBC WITH DIFFERENTIAL/PLATELET  BRAIN NATRIURETIC PEPTIDE  PROCALCITONIN  PROCALCITONIN  TROPONIN I (HIGH SENSITIVITY)  TROPONIN I (HIGH SENSITIVITY)    EKG EKG Interpretation  Date/Time:  Saturday June 07 2021 17:32:36 EDT Ventricular Rate:  94 PR Interval:  151 QRS Duration: 101 QT Interval:  340 QTC Calculation: 426 R Axis:   -18 Text Interpretation: Sinus rhythm Borderline left axis deviation Abnormal R-wave progression, late transition Baseline wander in  lead(s) V6 Similar to prior Confirmed by Nanda Quinton [phone]) on 06/07/2021 6:58:52 PM  Radiology DG Chest 2 View  Result Date: 06/07/2021 CLINICAL DATA:  Short of breath, cough and congestion for 5 days EXAM: CHEST - 2 VIEW COMPARISON:  06/27/2019 FINDINGS: Frontal and lateral views of the chest demonstrate an unremarkable cardiac silhouette. No acute airspace disease, effusion, or pneumothorax. No acute bony abnormalities. IMPRESSION: 1. No acute intrathoracic process. Electronically Signed   By: Randa Ngo M.D.   On: 06/07/2021 18:36   CT Angio Chest Pulmonary Embolism (PE) W or WO Contrast  Result Date: 06/07/2021 CLINICAL DATA:  Cough, short of breath for 5 days, positive D dimer EXAM: CT ANGIOGRAPHY CHEST WITH CONTRAST TECHNIQUE: Multidetector CT imaging of the chest was performed using the standard protocol during bolus administration of intravenous contrast. Multiplanar CT image reconstructions and MIPs were obtained to evaluate the vascular anatomy. CONTRAST:  1m OMNIPAQUE IOHEXOL 350 MG/ML SOLN COMPARISON:  06/07/2021 FINDINGS: Cardiovascular: There is technically adequate opacification of the pulmonary vasculature. Evaluation is limited due to body habitus. There are no central or segmental pulmonary emboli. Subsegmental branches are difficult to evaluate due to body habitus. Mild cardiomegaly without pericardial effusion. No evidence of thoracic aortic aneurysm or dissection. Mediastinum/Nodes: No enlarged mediastinal, hilar, or axillary lymph nodes. Thyroid gland, trachea, and esophagus demonstrate no significant findings. Lungs/Pleura: Bilateral airspace disease is identified, most pronounced in the upper lobes and left lower lobe, most consistent with multifocal pneumonia. No effusion or pneumothorax. Central airways are patent. Upper Abdomen: Marked hepatic steatosis. No acute upper abdominal finding. Musculoskeletal: No acute or destructive bony lesions. Reconstructed images  demonstrate no additional findings. Review of the MIP images confirms the above findings. IMPRESSION: 1. No evidence of pulmonary embolus. 2. Multifocal bilateral airspace disease consistent with pneumonia. 3. Cardiomegaly. Electronically Signed   By: MRanda NgoM.D.   On: 06/07/2021 20:02    Procedures None   Medications Ordered in ED Medications  cefTRIAXone (ROCEPHIN) 1 g in sodium chloride 0.9 % 100 mL IVPB (has no administration in time range)  azithromycin (ZITHROMAX) 500 mg in sodium chloride 0.9 % 250 mL IVPB (has no administration in time range)  iohexol (OMNIPAQUE) 350 MG/ML injection 80 mL (80 mLs Intravenous Contrast Given 06/07/21 1945)    ED Course  I have reviewed the triage vital signs and the nursing notes.  Pertinent labs & imaging results that  were available during my care of the patient were reviewed by me and considered in my medical decision making (see chart for details).    MDM Rules/Calculators/A&P                           Patient presents with several days of progressive shortness of breath preceded by left calf pain.  He was initially evaluated at urgent care where he tested negative for COVID-19 however given this calf pain and shortness of breath he was recommended to present for evaluation at the ED.  Presenting labs concerning for elevated D-dimer to 0.80.  Chest x-ray showed no acute abnormalities.  Patient presentations concerning for a wide spectrum of diagnoses including infectious, heart failure, pulmonary embolism.  Fortunately patient does not have a leukocytosis and is afebrile.  Respiratory panel was negative.  Well score is 2.5 placing patient at a moderate risk for pulmonary embolism.  EKG showed possible left axis deviation, initial troponin was negative, BNP negative.  CTA chest was done and showed no evidence of pulmonary embolism however did show multifocal bilateral airspace disease concerning for pneumonia.  Patient was initiated at this time  on azithromycin 500 mg and Rocephin 1 g.  Consult for admission placed for multifocal pneumonia with acute respiratory failure with hypoxia.    Final Clinical Impression(s) / ED Diagnoses Final diagnoses:  Acute respiratory failure with hypoxia [facility])  Multifocal pneumonia    Rx / DC Orders ED Discharge Orders     None        Farrel Gordon, DO 06/07/21 2047    Margette Fast, MD 06/09/21 [phone]

## 2021-06-07 NOTE — ED Notes (Signed)
Pt. CBG 282, RN, Pray,J. Made aware.

## 2021-06-08 ENCOUNTER — Inpatient Hospital Stay (HOSPITAL_COMMUNITY): Payer: Self-pay

## 2021-06-08 DIAGNOSIS — I509 Heart failure, unspecified: Secondary | ICD-10-CM

## 2021-06-08 LAB — CBC WITH DIFFERENTIAL/PLATELET
Abs Immature Granulocytes: 0.04 10*3/uL (ref 0.00–0.07)
Basophils Absolute: 0 10*3/uL (ref 0.0–0.1)
Basophils Relative: 0 %
Eosinophils Absolute: 0.3 10*3/uL (ref 0.0–0.5)
Eosinophils Relative: 4 %
HCT: 36.4 % — ABNORMAL LOW (ref 39.0–52.0)
Hemoglobin: 11.7 g/dL — ABNORMAL LOW (ref 13.0–17.0)
Immature Granulocytes: 1 %
Lymphocytes Relative: 29 %
Lymphs Abs: 2.4 10*3/uL (ref 0.7–4.0)
MCH: 26.8 pg (ref 26.0–34.0)
MCHC: 32.1 g/dL (ref 30.0–36.0)
MCV: 83.3 fL (ref 80.0–100.0)
Monocytes Absolute: 1 10*3/uL (ref 0.1–1.0)
Monocytes Relative: 12 %
Neutro Abs: 4.5 10*3/uL (ref 1.7–7.7)
Neutrophils Relative %: 54 %
Platelets: 191 10*3/uL (ref 150–400)
RBC: 4.37 MIL/uL (ref 4.22–5.81)
RDW: 14.6 % (ref 11.5–15.5)
WBC: 8.2 10*3/uL (ref 4.0–10.5)
nRBC: 0 % (ref 0.0–0.2)

## 2021-06-08 LAB — ECHOCARDIOGRAM COMPLETE
AR max vel: 5.24 cm2
AV Area VTI: 5.07 cm2
AV Area mean vel: 5.1 cm2
AV Mean grad: 5 mmHg
AV Peak grad: 8.2 mmHg
Ao pk vel: 1.43 m/s
Area-P 1/2: 3.99 cm2
Height: 69 in
S' Lateral: 3.5 cm
Weight: 5749.6 oz

## 2021-06-08 LAB — BASIC METABOLIC PANEL
Anion gap: 9 (ref 5–15)
BUN: 18 mg/dL (ref 6–20)
CO2: 22 mmol/L (ref 22–32)
Calcium: 9.4 mg/dL (ref 8.9–10.3)
Chloride: 113 mmol/L — ABNORMAL HIGH (ref 98–111)
Creatinine, Ser: 1.38 mg/dL — ABNORMAL HIGH (ref 0.61–1.24)
GFR, Estimated: >60 mL/min (ref >60)
Glucose, Bld: 258 mg/dL — ABNORMAL HIGH (ref 70–99)
Potassium: 4.5 mmol/L (ref 3.5–5.1)
Sodium: 144 mmol/L (ref 135–145)

## 2021-06-08 LAB — PROCALCITONIN: Procalcitonin: <0.1 ng/mL

## 2021-06-08 LAB — CBG MONITORING, ED
Glucose-Capillary: 251 mg/dL — ABNORMAL HIGH (ref 70–99)
Glucose-Capillary: 339 mg/dL — ABNORMAL HIGH (ref 70–99)

## 2021-06-08 LAB — HIV ANTIBODY (ROUTINE TESTING W REFLEX): HIV Screen 4th Generation wRfx: NONREACTIVE

## 2021-06-08 LAB — GLUCOSE, CAPILLARY
Glucose-Capillary: 337 mg/dL — ABNORMAL HIGH (ref 70–99)
Glucose-Capillary: 366 mg/dL — ABNORMAL HIGH (ref 70–99)

## 2021-06-08 MED ORDER — PERFLUTREN LIPID MICROSPHERE
1.0000 mL | INTRAVENOUS | Status: AC | PRN
  Administered 2021-06-08: 2 mL via INTRAVENOUS
  Filled 2021-06-08: qty 10

## 2021-06-08 MED ORDER — SODIUM CHLORIDE 0.9 % IV SOLN: INTRAVENOUS | Status: DC | PRN

## 2021-06-08 MED ORDER — FUROSEMIDE 10 MG/ML IJ SOLN
40.0000 mg | Freq: Once | INTRAMUSCULAR | Status: AC
  Administered 2021-06-08: 40 mg via INTRAVENOUS
  Filled 2021-06-08: qty 4

## 2021-06-08 NOTE — Progress Notes (Signed)
  Echocardiogram 2D Echocardiogram has been performed.  Roosvelt Maser F 06/08/2021, 11:21 AM

## 2021-06-08 NOTE — Progress Notes (Signed)
PROGRESS NOTE  William Moss  DOB: May 12, 1986  PCP: Hoy Register, MD QVZ:[id number]  DOA: 06/07/2021  LOS: 1 day  Hospital Day: 2   Chief Complaint  Patient presents with   Shortness of Breath   Cough    Brief narrative: William Moss is a 35 y.o. male with PMH significant for morbid obesity, OSA, DM2, HTN, history of mild systolic CHF, CKD.   Patient presented to the ED with complaint of cough, shortness of breath, progressively worsening for last 4 days.  In the ED, patient was afebrile, oxygen saturation 85% on room air, mildly tachypneic, stable blood pressure Labs with creatinine elevated 1.55, CBC unremarkable Troponin and BNP normal COVID and influenza PCR negative D-dimer slightly elevated 0.8 CTA chest was negative for pulm embolism but concerning for multifocal bilateral airspace disease consistent with pneumonia Patient was started on IV antibiotics and admitted to hospitalist service See below for details.  Subjective: Patient was seen and examined this morning.  Pleasant young African-American male, morbidly obese, lying on bed.  Not in distress.  Not on supplemental oxygen at the time of my evaluation. Chart reviewed.  Blood pressure 140s overnight, tachypneic in 20s Labs this morning with glucose level elevated to 258, creatinine slightly improved to 1.38  Assessment/Plan: Acute respiratory failure with hypoxia  Multifocal pneumonia -presented with progressively worsening dyspnea for 4 days.  Initially O2 sat 85% on room air and required supplemental oxygen.  Not on supplemental oxygen this morning.  Check oxygen saturations on ambulation. -CT scan finding concerning for multifocal pneumonia.  Patient was started on IV Rocephin and IV azithromycin on admission which I will continue for now. Recent Labs  Lab 06/07/21 1732 06/07/21 1923 06/08/21 0541  WBC  --  9.0 8.2  PROCALCITON <0.10  --  <0.10   Possible acute CHF exacerbation -Despite the reading  of multifocal pneumonia and CT chest, patient has no fever, normal WBC count and normal procalcitonin level.  I would like to rule out CHF exacerbation.  In 2017, he had an abnormal stress test followed by cardiac cath which showed tortuous but normal coronary arteries.  Stress test noted mild systolic CHF with EF 45 to 54%.  Repeat echocardiogram.  Avoid IV hydration at this time.   -Also high suspicion of obstructive sleep apnea based on his BMI of 53. -Net IO Since Admission: 450 mL [06/08/21 1043] -Continue to monitor for daily intake output, weight, blood pressure, BNP, renal function and electrolytes. Recent Labs  Lab 06/07/21 1732 06/07/21 1933 06/08/21 0541  BNP  --  26.6  --   BUN 21*  --  18  CREATININE 1.55*  --  1.38*  K 4.4  --  4.5   Type 2 diabetes mellitus -A1c 7.4 on 8/18 -Home meds include Levemir 10 units at bedtime, metformin 1000 mg twice daily. -Currently continued on Levemir 10 units daily and started on sliding scale insulin. -Blood sugar level elevated over 200 yesterday.  Continue to monitor.  Recent Labs  Lab 06/07/21 2158 06/07/21 2311 06/08/21 0854  GLUCAP 282* 262* 251*   Essential hypertension -Home meds include Coreg 25 mg twice daily, amlodipine 10 mg daily -Blood pressure elevated to 140s this morning. -Currently continued on both meds.  Hyperlipidemia -Continue Lipitor 40 mg daily  CKD 3a -Creatinine at baseline.  Continue to monitor Recent Labs    08/26/20 1749 08/27/20 0422 10/07/20 1140 04/17/21 1011 06/07/21 1732 06/08/21 0541  BUN 44* 41* 19 21* 21* 18  CREATININE 2.28* 1.88* 1.62* 1.37* 1.55* 1.38*   Morbid obesity  -Body mass index is 53.07 kg/m. Patient has been advised to make an attempt to improve diet and exercise patterns to aid in weight loss.  Mobility: Encourage ambulation Code Status:   Code Status: Full Code  Nutritional status: Body mass index is 53.07 kg/m.     Diet:  Diet Order             Diet heart  healthy/carb modified Room service appropriate? Yes; Fluid consistency: Thin  Diet effective now                  DVT prophylaxis:  enoxaparin (LOVENOX) injection 40 mg Start: 06/07/21 2200   Antimicrobials: IV Rocephin, azithromycin (QTC 426 ms) Fluid: None Consultants: None at this time Family Communication: None at bedside  Status is: Inpatient  Remains inpatient appropriate because: Ongoing work-up for dyspnea, pending echocardiogram  Dispo: The patient is from: Home              Anticipated d/c is to: Home in 2 to 3 days              Patient currently is not medically stable to d/c.   Difficult to place patient No     Infusions:   azithromycin     cefTRIAXone (ROCEPHIN)  IV      Scheduled Meds:  amLODipine  10 mg Oral Daily   atorvastatin  40 mg Oral Daily   carvedilol  25 mg Oral BID   enoxaparin (LOVENOX) injection  40 mg Subcutaneous Q24H   insulin aspart  0-5 Units Subcutaneous QHS   insulin aspart  0-9 Units Subcutaneous TID WC   insulin detemir  10 Units Subcutaneous QHS    Antimicrobials: Anti-infectives (From admission, onward)    Start     Dose/Rate Route Frequency Ordered Stop   06/08/21 2200  cefTRIAXone (ROCEPHIN) 2 g in sodium chloride 0.9 % 100 mL IVPB        2 g 200 mL/hr over 30 Minutes Intravenous Every 24 hours 06/07/21 2103 06/12/21 2159   06/08/21 2200  azithromycin (ZITHROMAX) 500 mg in sodium chloride 0.9 % 250 mL IVPB        500 mg 250 mL/hr over 60 Minutes Intravenous Every 24 hours 06/07/21 2103 06/12/21 2159   06/07/21 2115  cefTRIAXone (ROCEPHIN) 1 g in sodium chloride 0.9 % 100 mL IVPB        1 g 200 mL/hr over 30 Minutes Intravenous  Once 06/07/21 2110 06/07/21 2343   06/07/21 2015  cefTRIAXone (ROCEPHIN) 1 g in sodium chloride 0.9 % 100 mL IVPB        1 g 200 mL/hr over 30 Minutes Intravenous  Once 06/07/21 2012 06/07/21 2130   06/07/21 2015  azithromycin (ZITHROMAX) 500 mg in sodium chloride 0.9 % 250 mL IVPB        500  mg 250 mL/hr over 60 Minutes Intravenous  Once 06/07/21 2012 06/07/21 2241       PRN meds:    Objective: Vitals:   06/08/21 0645 06/08/21 1008  BP:  (!) 151/101  Pulse: 86 81  Resp: 18 (!) 22  Temp:  98.4 F (36.9 C)  SpO2: 94% 99%    Intake/Output Summary (Last 24 hours) at 06/08/2021 1043 Last data filed at 06/07/2021 2343 Gross per 24 hour  Intake 450 ml  Output --  Net 450 ml   Filed Weights   06/07/21 2126  Weight: Marland Kitchen)  163 kg   Weight change:  Body mass index is 53.07 kg/m.   Physical Exam: General exam: Pleasant, morbidly obese young African-American male Skin: No rashes, lesions or ulcers. HEENT: Atraumatic, normocephalic, no obvious bleeding Lungs: Clear to auscultation bilaterally CVS: Regular rate and rhythm, no murmur GI/Abd soft, distended from obesity, nontender, bowel sound present CNS: Alert, awake, oriented x3 Psychiatry: Mood appropriate Extremities: Pedal edema trace plus bilaterally, no calf tenderness  Data Review: I have personally reviewed the laboratory data and studies available.  Recent Labs  Lab 06/07/21 1923 06/08/21 0541  WBC 9.0 8.2  NEUTROABS 5.7 4.5  HGB 13.1 11.7*  HCT 40.7 36.4*  MCV 83.1 83.3  PLT 200 191   Recent Labs  Lab 06/07/21 1732 06/08/21 0541  NA 141 144  K 4.4 4.5  CL 113* 113*  CO2 19* 22  GLUCOSE 414* 258*  BUN 21* 18  CREATININE 1.55* 1.38*  CALCIUM 9.8 9.4    F/u labs ordered Unresulted Labs (From admission, onward)     Start     Ordered   06/14/21 0500  Creatinine, serum  (enoxaparin (LOVENOX)    CrCl >/= 30 ml/min)  Weekly,   R     Comments: while on enoxaparin therapy    06/07/21 2103   06/08/21 0500  Procalcitonin  Daily,   R      06/07/21 2038   06/08/21 0500  HIV Antibody (routine testing w rflx)  (HIV Antibody (Routine testing w reflex) panel)  Tomorrow morning,   R        06/07/21 2103   06/08/21 0500  CBC WITH DIFFERENTIAL  Daily,   R      06/07/21 2103   06/08/21 0500  Basic  metabolic panel  Daily,   R      06/07/21 2103            Signed, Lorin Glass, MD [facility] 06/08/2021

## 2021-06-08 NOTE — Progress Notes (Signed)
Patient ambulated with stand by asst to bathroom. Patient o2 sats dropped to 80% on exertion. No obvious respiratory distressed observed. Vs once patient returned to bed was 98.1, B/P 169/100,92Hr,  22rr, 92 % on 2 liters. Will cont to monitor.

## 2021-06-09 ENCOUNTER — Inpatient Hospital Stay (HOSPITAL_COMMUNITY): Payer: Self-pay

## 2021-06-09 ENCOUNTER — Encounter (HOSPITAL_COMMUNITY): Payer: Self-pay

## 2021-06-09 DIAGNOSIS — R0600 Dyspnea, unspecified: Secondary | ICD-10-CM | POA: Diagnosis not present

## 2021-06-09 LAB — BASIC METABOLIC PANEL
Anion gap: 8 (ref 5–15)
BUN: 21 mg/dL — ABNORMAL HIGH (ref 6–20)
CO2: 22 mmol/L (ref 22–32)
Calcium: 9.2 mg/dL (ref 8.9–10.3)
Chloride: 108 mmol/L (ref 98–111)
Creatinine, Ser: 1.55 mg/dL — ABNORMAL HIGH (ref 0.61–1.24)
GFR, Estimated: 59 mL/min — ABNORMAL LOW (ref >60)
Glucose, Bld: 282 mg/dL — ABNORMAL HIGH (ref 70–99)
Potassium: 3.9 mmol/L (ref 3.5–5.1)
Sodium: 138 mmol/L (ref 135–145)

## 2021-06-09 LAB — CBC WITH DIFFERENTIAL/PLATELET
Abs Immature Granulocytes: 0.02 10*3/uL (ref 0.00–0.07)
Basophils Absolute: 0 10*3/uL (ref 0.0–0.1)
Basophils Relative: 0 %
Eosinophils Absolute: 0.4 10*3/uL (ref 0.0–0.5)
Eosinophils Relative: 6 %
HCT: 38.3 % — ABNORMAL LOW (ref 39.0–52.0)
Hemoglobin: 12.7 g/dL — ABNORMAL LOW (ref 13.0–17.0)
Immature Granulocytes: 0 %
Lymphocytes Relative: 32 %
Lymphs Abs: 2.1 10*3/uL (ref 0.7–4.0)
MCH: 27 pg (ref 26.0–34.0)
MCHC: 33.2 g/dL (ref 30.0–36.0)
MCV: 81.5 fL (ref 80.0–100.0)
Monocytes Absolute: 0.8 10*3/uL (ref 0.1–1.0)
Monocytes Relative: 12 %
Neutro Abs: 3.4 10*3/uL (ref 1.7–7.7)
Neutrophils Relative %: 50 %
Platelets: 218 10*3/uL (ref 150–400)
RBC: 4.7 MIL/uL (ref 4.22–5.81)
RDW: 14.7 % (ref 11.5–15.5)
WBC: 6.8 10*3/uL (ref 4.0–10.5)
nRBC: 0.3 % — ABNORMAL HIGH (ref 0.0–0.2)

## 2021-06-09 LAB — PROCALCITONIN: Procalcitonin: <0.1 ng/mL

## 2021-06-09 LAB — GLUCOSE, CAPILLARY
Glucose-Capillary: 264 mg/dL — ABNORMAL HIGH (ref 70–99)
Glucose-Capillary: 381 mg/dL — ABNORMAL HIGH (ref 70–99)

## 2021-06-09 MED ORDER — CEFDINIR 300 MG PO CAPS: 300.0000 mg | ORAL_CAPSULE | Freq: Two times a day (BID) | ORAL | 0 refills | Status: AC

## 2021-06-09 MED ORDER — INSULIN DETEMIR 100 UNIT/ML ~~LOC~~ SOLN: 15.0000 [IU] | Freq: Every day | SUBCUTANEOUS | Status: DC

## 2021-06-09 MED ORDER — HYDROCHLOROTHIAZIDE 12.5 MG PO CAPS: 12.5000 mg | ORAL_CAPSULE | Freq: Every day | ORAL | 2 refills | Status: DC

## 2021-06-09 MED ORDER — LEVEMIR FLEXTOUCH 100 UNIT/ML ~~LOC~~ SOPN: 15.0000 [IU] | PEN_INJECTOR | Freq: Every day | SUBCUTANEOUS | 6 refills | Status: DC

## 2021-06-09 NOTE — Progress Notes (Signed)
SATURATION QUALIFICATIONS: (This note is used to comply with regulatory documentation for home oxygen)  Patient Saturations on Room Air at Rest = 96%  Patient Saturations on Room Air while Ambulating = 93%   Please briefly explain why patient needs home oxygen: does not qualify

## 2021-06-09 NOTE — Progress Notes (Signed)
Renal artery duplex ultrasound attempted, however non-diagnostic due to habitus.  Discussed with Delice Bison, RN.    06/09/2021 9:49 AM Yazlyn Wentzel RVT, RDMS

## 2021-06-09 NOTE — Progress Notes (Signed)
Patient is being discharged to home. AVS reviewed, prescriptions sent to patient's pharmacy. Case manager provided a Programmer, systems. Vascular lab contacted to clarify appointment shown on AVS for renal US and order was discontinued. IV removed and tele box was returned. Patient confirmed he has all of his belongings. Patient is providing his own transportation.

## 2021-06-09 NOTE — Progress Notes (Signed)
Pt had 3.78 sec pause.  Pt asymptomatic and VSS.  Notified night on call MD/NP. Charted for P.Deloria Lair, RN Dot Been, Leodis [place]

## 2021-06-09 NOTE — TOC Initial Note (Signed)
Transition of Care [facility]) - Initial/Assessment Note    Patient Details  Name: RACHID PARHAM MRN: [id number] Date of Birth: 31-Mar-1986  Transition of Care [facility]) CM/SW Contact:    Golda Acre, RN Phone Number: 06/09/2021, 12:56 PM  Clinical Narrative:                 Patient dcd to home with self care.  No hhc order in chart.  Expected Discharge Plan: Home/Self Care Barriers to Discharge: No Barriers Identified   Patient Goals and CMS Choice Patient states their goals for this hospitalization and ongoing recovery are:: to go home CMS Medicare.gov Compare Post Acute Care list provided to:: Patient    Expected Discharge Plan and Services Expected Discharge Plan: Home/Self Care   Discharge Planning Services: CM Consult   Living arrangements for the past 2 months: Single Family Home Expected Discharge Date: 06/09/21                                    Prior Living Arrangements/Services Living arrangements for the past 2 months: Single Family Home Lives with:: Self Patient language and need for interpreter reviewed:: Yes Do you feel safe going back to the place where you live?: Yes            Criminal Activity/Legal Involvement Pertinent to Current Situation/Hospitalization: No - Comment as needed  Activities of Daily Living Home Assistive Devices/Equipment: None ADL Screening (condition at time of admission) Patient's cognitive ability adequate to safely complete daily activities?: Yes Is the patient deaf or have difficulty hearing?: No Does the patient have difficulty seeing, even when wearing glasses/contacts?: No Does the patient have difficulty concentrating, remembering, or making decisions?: No Patient able to express need for assistance with ADLs?: Yes Does the patient have difficulty dressing or bathing?: No Independently performs ADLs?: Yes (appropriate for developmental age) Does the patient have difficulty walking or climbing stairs?:  No Weakness of Legs: None Weakness of Arms/Hands: None  Permission Sought/Granted                  Emotional Assessment Appearance:: Appears stated age     Orientation: : Oriented to Self, Oriented to Place, Oriented to  Time, Oriented to Situation Alcohol / Substance Use: Not Applicable Psych Involvement: No (comment)  Admission diagnosis:  Pneumonia [J18.9] Acute respiratory failure with hypoxia (HCC) [J96.01] Multifocal pneumonia [J18.9] Patient Active Problem List   Diagnosis Date Noted   Pneumonia 06/07/2021   CKD (chronic kidney disease), stage II 06/07/2021   AKI (acute kidney injury) (HCC) 08/27/2020   Insulin dependent type 2 diabetes mellitus (HCC) 08/27/2020   Metabolic syndrome 08/27/2020   Diabetes mellitus due to underlying condition, controlled, with complication, without long-term current use of insulin (HCC)    Pure hypercholesterolemia    Morbid obesity with BMI of 60.0-69.9, adult (HCC)    Diabetes mellitus type 2, uncontrolled, with complications 06/28/2019   Acute respiratory failure with hypoxia (HCC) 06/28/2019   Sinus tachycardia 06/27/2019   COVID-19 virus infection 06/27/2019   Sleep apnea 03/27/2019   Normal coronary arteries 03/27/2019   GERD (gastroesophageal reflux disease) 10/02/2016   Coronary artery disease involving native heart    Abnormal nuclear stress test    Atypical chest pain 05/27/2016   Type II diabetes mellitus with stage 2 chronic kidney disease (HCC) 09/30/2015   Hematochezia 09/30/2015   Essential hypertension 07/29/2015   Morbid obesity (HCC)  07/29/2015   PCP:  Hoy Register, MD Pharmacy:   [facility] [phone] - [place], [place] - [address] AT [facility] [address] [place] [address] Phone: [phone] Fax: [phone]     Social Determinants of Health (SDOH) Interventions    Readmission Risk Interventions No flowsheet data found.

## 2021-06-09 NOTE — Discharge Summary (Signed)
Physician Discharge Summary  William Moss IEP:[id number] DOB: 09-Dec-1985 DOA: 06/07/2021  PCP: Charlott Rakes, MD  Admit date: 06/07/2021 Discharge date: 06/09/2021  Admitted From: Home Discharge disposition: Home   Code Status: Full Code   Discharge Diagnosis:   Principal Problem:   Pneumonia Active Problems:   Essential hypertension   Type II diabetes mellitus with stage 2 chronic kidney disease ([place])   Acute respiratory failure with hypoxia ([place])   CKD (chronic kidney disease), stage II    Chief Complaint  Patient presents with   Shortness of Breath   Cough    Brief narrative: DAYTON KENLEY is a 35 y.o. male with PMH significant for morbid obesity, OSA, DM2, HTN, history of mild systolic CHF, CKD.   Patient presented to the ED with complaint of cough, shortness of breath, progressively worsening for last 4 days.  In the ED, patient was afebrile, oxygen saturation 85% on room air, mildly tachypneic, stable blood pressure Labs with creatinine elevated 1.55, CBC unremarkable Troponin and BNP normal COVID and influenza PCR negative D-dimer slightly elevated 0.8 CTA chest was negative for pulm embolism but concerning for multifocal bilateral airspace disease consistent with pneumonia Patient was started on IV antibiotics and admitted to hospitalist service See below for details.  Subjective: Patient was seen and examined this morning.  Propped up in bed.  Not in distress.  Not on supplemental oxygen.  Able to ambulate on the hallway without dropping oxygen saturation to less than 93% on room air.   Hospital course Acute respiratory failure with hypoxia  Multifocal pneumonia -presented with progressively worsening dyspnea for 4 days. -CT scan finding concerning for multifocal pneumonia.  Patient was started on IV Rocephin and IV azithromycin on admission.  I will discharge him on 5 more days of oral Omnicef.   Initially O2 sat 85% on room air and required  supplemental oxygen.  Gradually weaned down.  Able to ambulate in the hallway without requiring supplemental oxygen. Recent Labs  Lab 06/07/21 1732 06/07/21 1923 06/08/21 0541 06/09/21 0459  WBC  --  9.0 8.2 6.8  PROCALCITON <0.10  --  <0.10 <0.10   Hyperdynamic LV Mild LVH -Despite the reading of multifocal pneumonia and CT chest, patient has no fever, normal WBC count and normal procalcitonin level.  He has no prior diagnosis of congestive heart failure.  Echocardiogram was obtained which showed EF of 70 to 75%, hyperdynamic LV function, no regional wall motion abnormality, mild LVH.  Right ventricular systolic function and size could not be visualized because of body habitus.  -He was given a dose of IV Lasix 40 mg yesterday after which he diuresed about 1.5 L.  Oxygenation has improved. -I do not have a clear diagnosis of CHF at this time but he responded well to diuretics.  Type 2 diabetes mellitus -A1c 7.4 on 8/18 -Home meds include Levemir 10 units at bedtime, metformin 1000 mg twice daily. -Because of elevated creatinine, metformin was stopped.  He was maintained on Levemir 10 units daily with which his blood sugar level has been consistently running in 300s. -At discharge, I would increase Levemir to 15 units daily at bedtime.  I would not resume metformin.  Patient agrees with the plan. Recent Labs  Lab 06/08/21 0854 06/08/21 1358 06/08/21 1733 06/08/21 2139 06/09/21 0728  GLUCAP 251* 339* 337* 366* 264*   Essential hypertension -Home meds include Coreg 25 mg twice daily, amlodipine 10 mg daily -Blood pressure mostly elevated to 160s and 170s  despite these medications.  I tried to obtain ultrasound duplex of renal arteries but technically difficult because of body habitus -Post discharge, continue Coreg and amlodipine.  I also added HCTZ 12.5 mg daily.  CKD 3a -Creatinine at baseline.  Continue to monitor and an outpatient Recent Labs    08/26/20 1749 08/27/20 0422  10/07/20 1140 04/17/21 1011 06/07/21 1732 06/08/21 0541 06/09/21 0459  BUN 44* 41* 19 21* 21* 18 21*  CREATININE 2.28* 1.88* 1.62* 1.37* 1.55* 1.38* 1.55*   Hyperlipidemia -Continue Lipitor 40 mg daily  Morbid obesity  -Body mass index is 56.18 kg/m. Patient has been advised to make an attempt to improve diet and exercise patterns to aid in weight loss.   Allergies as of 06/09/2021       Reactions   Lisinopril-hydrochlorothiazide Other (See Comments)   "Interfered with the pH level in the serum"        Medication List     STOP taking these medications    OneTouch Verio test strip Generic drug: glucose blood       TAKE these medications    amLODipine 10 MG tablet Commonly known as: NORVASC Take 1 tablet (10 mg total) by mouth daily.   aspirin 325 MG EC tablet Take 325 mg by mouth daily as needed for pain (or headaches).   atorvastatin 40 MG tablet Commonly known as: LIPITOR Take 1 tablet (40 mg total) by mouth daily.   BD Pen Needle Nano U/F 32G X 4 MM Misc Generic drug: Insulin Pen Needle 10 Units by Does not apply route daily.   Byetta 5 MCG Pen 5 MCG/0.02ML Sopn injection Generic drug: exenatide Inject 5 mcg into the skin 2 (two) times daily with a meal.   carvedilol 25 MG tablet Commonly known as: COREG Take 1 tablet (25 mg total) by mouth 2 (two) times daily.   cefdinir 300 MG capsule Commonly known as: OMNICEF Take 1 capsule (300 mg total) by mouth 2 (two) times daily for 5 days.   hydrochlorothiazide 12.5 MG capsule Commonly known as: Microzide Take 1 capsule (12.5 mg total) by mouth daily.   Ipratropium-Albuterol 20-100 MCG/ACT Aers respimat Commonly known as: COMBIVENT Inhale 1 puff into the lungs 4 (four) times daily.   Levemir FlexTouch 100 UNIT/ML FlexPen Generic drug: insulin detemir Inject 15 Units into the skin at bedtime. What changed: how much to take   metFORMIN 500 MG tablet Commonly known as: GLUCOPHAGE Take 2 tablets  (1,000 mg total) by mouth 2 (two) times daily with a meal.   montelukast 10 MG tablet Commonly known as: SINGULAIR Take 10 mg by mouth daily.   OneTouch Delica Plus [id number] Misc CHECK BLOOD SUGAR 3 TIMES A DAY   OneTouch Verio Reflect w/Device Kit Check blood sugar TID E11.69   terbinafine 1 % cream Commonly known as: LamISIL AT Apply 1 application topically 2 (two) times daily.        Discharge Instructions:  Diet Recommendation: Cardiac/diabetic diet   _0 @  Follow ups:    Follow-up Information     Charlott Rakes, MD Follow up.   Specialty: Family Medicine Contact information: [place] [address] [phone]         Skeet Latch, MD .   Specialty: Cardiology Contact information: [address] [place] [address] [phone]                 Wound care:     Discharge Exam:   Vitals:  06/08/21 1955 06/09/21 0129 06/09/21 0445 06/09/21 0600  BP: (!) 169/100 137/89 (!) 149/92 (!) 181/99  Pulse: 92 82 85 89  Resp:  18 18 (!) 24  Temp: 98.1 F (36.7 C) 98.2 F (36.8 C) 98.2 F (36.8 C) 98.4 F (36.9 C)  TempSrc: Oral   Oral  SpO2:  93% 94% 94%  Weight:      Height:        Body mass index is 56.18 kg/m.  General exam: Young, morbidly obese African-American male. Skin: No rashes, lesions or ulcers. HEENT: Atraumatic, normocephalic, no obvious bleeding Lungs: Diminished air entry in both bases, no crackles CVS: Regular rate and rhythm, no murmur GI/Abd soft, distended from obesity, nontender, bowel sound present CNS: Alert, awake, oriented x3 Psychiatry: Mood appropriate Extremities: No pedal edema, no calf tenderness  Time coordinating discharge: 35 minutes   The results of significant diagnostics from this hospitalization (including imaging, microbiology, ancillary and laboratory) are listed below for reference.    Procedures and Diagnostic Studies:   DG Chest 2  View  Result Date: 06/07/2021 CLINICAL DATA:  Short of breath, cough and congestion for 5 days EXAM: CHEST - 2 VIEW COMPARISON:  06/27/2019 FINDINGS: Frontal and lateral views of the chest demonstrate an unremarkable cardiac silhouette. No acute airspace disease, effusion, or pneumothorax. No acute bony abnormalities. IMPRESSION: 1. No acute intrathoracic process. Electronically Signed   By: Randa Ngo M.D.   On: 06/07/2021 18:36   CT Angio Chest Pulmonary Embolism (PE) W or WO Contrast  Result Date: 06/07/2021 CLINICAL DATA:  Cough, short of breath for 5 days, positive D dimer EXAM: CT ANGIOGRAPHY CHEST WITH CONTRAST TECHNIQUE: Multidetector CT imaging of the chest was performed using the standard protocol during bolus administration of intravenous contrast. Multiplanar CT image reconstructions and MIPs were obtained to evaluate the vascular anatomy. CONTRAST:  76m OMNIPAQUE IOHEXOL 350 MG/ML SOLN COMPARISON:  06/07/2021 FINDINGS: Cardiovascular: There is technically adequate opacification of the pulmonary vasculature. Evaluation is limited due to body habitus. There are no central or segmental pulmonary emboli. Subsegmental branches are difficult to evaluate due to body habitus. Mild cardiomegaly without pericardial effusion. No evidence of thoracic aortic aneurysm or dissection. Mediastinum/Nodes: No enlarged mediastinal, hilar, or axillary lymph nodes. Thyroid gland, trachea, and esophagus demonstrate no significant findings. Lungs/Pleura: Bilateral airspace disease is identified, most pronounced in the upper lobes and left lower lobe, most consistent with multifocal pneumonia. No effusion or pneumothorax. Central airways are patent. Upper Abdomen: Marked hepatic steatosis. No acute upper abdominal finding. Musculoskeletal: No acute or destructive bony lesions. Reconstructed images demonstrate no additional findings. Review of the MIP images confirms the above findings. IMPRESSION: 1. No evidence of  pulmonary embolus. 2. Multifocal bilateral airspace disease consistent with pneumonia. 3. Cardiomegaly. Electronically Signed   By: MRanda NgoM.D.   On: 06/07/2021 20:02   ECHOCARDIOGRAM COMPLETE  Result Date: 06/08/2021    ECHOCARDIOGRAM REPORT   Patient Name:   LZIDAN HELGETDate of Exam: 06/08/2021 Medical Rec #:  [id number]  Height:       69.0 in Accession #:    [id number] Weight:       359.3 lb Date of Birth:  1June 12, 1987 BSA:          2.650 m Patient Age:    34 years    BP:           151/101 mmHg Patient Gender: M           HR:  85 bpm. Exam Location:  Inpatient Procedure: 2D Echo, Cardiac Doppler, Color Doppler and Intracardiac            Opacification Agent Indications:    CHF  History:        Patient has no prior history of Echocardiogram examinations.                 Risk Factors:Morbid Obesity.  Sonographer:    Merrie Roof RDCS Referring Phys: [id number] [place]  1. Left ventricular ejection fraction, by estimation, is 70 to 75%. The left ventricle has hyperdynamic function. The left ventricle has no regional wall motion abnormalities. There is mild left ventricular hypertrophy. Left ventricular diastolic parameters are indeterminate.  2. Right ventricular systolic function was not well visualized. The right ventricular size is not well visualized.  3. The mitral valve is grossly normal. No evidence of mitral valve regurgitation. No evidence of mitral stenosis.  4. The aortic valve was not well visualized. Aortic valve regurgitation is not visualized. No aortic stenosis is present. FINDINGS  Left Ventricle: Left ventricular ejection fraction, by estimation, is 70 to 75%. The left ventricle has hyperdynamic function. The left ventricle has no regional wall motion abnormalities. Definity contrast agent was given IV to delineate the left ventricular endocardial borders. The left ventricular internal cavity size was normal in size. There is mild left ventricular hypertrophy.  Left ventricular diastolic parameters are indeterminate. Right Ventricle: The right ventricular size is not well visualized. Right vetricular wall thickness was not well visualized. Right ventricular systolic function was not well visualized. Left Atrium: Left atrial size was not well visualized. Right Atrium: Right atrial size was not well visualized. Pericardium: There is no evidence of pericardial effusion. Mitral Valve: The mitral valve is grossly normal. No evidence of mitral valve regurgitation. No evidence of mitral valve stenosis. Tricuspid Valve: The tricuspid valve is grossly normal. Tricuspid valve regurgitation is not demonstrated. Aortic Valve: The aortic valve was not well visualized. Aortic valve regurgitation is not visualized. No aortic stenosis is present. Aortic valve mean gradient measures 5.0 mmHg. Aortic valve peak gradient measures 8.2 mmHg. Aortic valve area, by VTI measures 5.07 cm. Pulmonic Valve: The pulmonic valve was not well visualized. Pulmonic valve regurgitation is not visualized. Aorta: The aortic root is normal in size and structure. IAS/Shunts: The interatrial septum was not well visualized.  LEFT VENTRICLE PLAX 2D LVIDd:         4.70 cm   Diastology LVIDs:         3.50 cm   LV e' medial:    7.29 cm/s LV PW:         1.20 cm   LV E/e' medial:  9.8 LV IVS:        1.10 cm   LV e' lateral:   8.59 cm/s LVOT diam:     2.60 cm   LV E/e' lateral: 8.3 LV SV:         133 LV SV Index:   50 LVOT Area:     5.31 cm  LEFT ATRIUM           Index        RIGHT ATRIUM           Index LA diam:      3.90 cm 1.47 cm/m   RA Area:     17.80 cm LA Vol (A2C): 54.0 ml 20.38 ml/m  RA Volume:   53.20 ml  20.07 ml/m  AORTIC VALVE AV Area (  Vmax):    5.24 cm AV Area (Vmean):   5.10 cm AV Area (VTI):     5.07 cm AV Vmax:           143.00 cm/s AV Vmean:          98.400 cm/s AV VTI:            0.262 m AV Peak Grad:      8.2 mmHg AV Mean Grad:      5.0 mmHg LVOT Vmax:         141.00 cm/s LVOT Vmean:         94.600 cm/s LVOT VTI:          0.250 m LVOT/AV VTI ratio: 0.95  AORTA Ao Root diam: 3.60 cm MITRAL VALVE MV Area (PHT): 3.99 cm    SHUNTS MV Decel Time: 190 msec    Systemic VTI:  0.25 m MV E velocity: 71.10 cm/s  Systemic Diam: 2.60 cm MV A velocity: 84.20 cm/s MV E/A ratio:  0.84 Oswaldo Milian MD Electronically signed by Oswaldo Milian MD Signature Date/Time: 06/08/2021/1:59:27 PM    Final      Labs:   Basic Metabolic Panel: Recent Labs  Lab 06/07/21 1732 06/08/21 0541 06/09/21 0459  NA 141 144 138  K 4.4 4.5 3.9  CL 113* 113* 108  CO2 19* 22 22  GLUCOSE 414* 258* 282*  BUN 21* 18 21*  CREATININE 1.55* 1.38* 1.55*  CALCIUM 9.8 9.4 9.2   GFR Estimated Creatinine Clearance: 103.8 mL/min (A) (by C-G formula based on SCr of 1.55 mg/dL (H)). Liver Function Tests: Recent Labs  Lab 06/07/21 1732  AST 13*  ALT 23  ALKPHOS 80  BILITOT 0.5  PROT 8.5*  ALBUMIN 4.1   Recent Labs  Lab 06/07/21 1732  LIPASE 28   No results for input(s): AMMONIA in the last 168 hours. Coagulation profile No results for input(s): INR, PROTIME in the last 168 hours.  CBC: Recent Labs  Lab 06/07/21 1923 06/08/21 0541 06/09/21 0459  WBC 9.0 8.2 6.8  NEUTROABS 5.7 4.5 3.4  HGB 13.1 11.7* 12.7*  HCT 40.7 36.4* 38.3*  MCV 83.1 83.3 81.5  PLT 200 191 218   Cardiac Enzymes: No results for input(s): CKTOTAL, CKMB, CKMBINDEX, TROPONINI in the last 168 hours. BNP: Invalid input(s): POCBNP CBG: Recent Labs  Lab 06/08/21 0854 06/08/21 1358 06/08/21 1733 06/08/21 2139 06/09/21 0728  GLUCAP 251* 339* 337* 366* 264*   D-Dimer Recent Labs    06/07/21 1732  DDIMER 0.80*   Hgb A1c No results for input(s): HGBA1C in the last 72 hours. Lipid Profile No results for input(s): CHOL, HDL, LDLCALC, TRIG, CHOLHDL, LDLDIRECT in the last 72 hours. Thyroid function studies No results for input(s): TSH, T4TOTAL, T3FREE, THYROIDAB in the last 72 hours.  Invalid input(s):  FREET3 Anemia work up No results for input(s): VITAMINB12, FOLATE, FERRITIN, TIBC, IRON, RETICCTPCT in the last 72 hours. Microbiology Recent Results (from the past 240 hour(s))  Resp Panel by RT-PCR (Flu A&B, Covid) Nasopharyngeal Swab     Status: None   Collection Time: 06/07/21  6:58 PM   Specimen: Nasopharyngeal Swab; Nasopharyngeal(NP) swabs in vial transport medium  Result Value Ref Range Status   SARS Coronavirus 2 by RT PCR NEGATIVE NEGATIVE Final    Comment: (NOTE) SARS-CoV-2 target nucleic acids are NOT DETECTED.  The SARS-CoV-2 RNA is generally detectable in upper respiratory specimens during the acute phase of infection. The lowest concentration of SARS-CoV-2 viral copies  this assay can detect is 138 copies/mL. A negative result does not preclude SARS-Cov-2 infection and should not be used as the sole basis for treatment or other patient management decisions. A negative result may occur with  improper specimen collection/handling, submission of specimen other than nasopharyngeal swab, presence of viral mutation(s) within the areas targeted by this assay, and inadequate number of viral copies(<138 copies/mL). A negative result must be combined with clinical observations, patient history, and epidemiological information. The expected result is Negative.  Fact Sheet for Patients:  EntrepreneurPulse.com.au  Fact Sheet for Healthcare Providers:  IncredibleEmployment.be  This test is no t yet approved or cleared by the [place] FDA and  has been authorized for detection and/or diagnosis of SARS-CoV-2 by FDA under an Emergency Use Authorization (EUA). This EUA will remain  in effect (meaning this test can be used) for the duration of the COVID-19 declaration under Section 564(b)(1) of the Act, 21 U.S.C.section 360bbb-3(b)(1), unless the authorization is terminated  or revoked sooner.       Influenza A by PCR NEGATIVE NEGATIVE  Final   Influenza B by PCR NEGATIVE NEGATIVE Final    Comment: (NOTE) The Xpert Xpress SARS-CoV-2/FLU/RSV plus assay is intended as an aid in the diagnosis of influenza from Nasopharyngeal swab specimens and should not be used as a sole basis for treatment. Nasal washings and aspirates are unacceptable for Xpert Xpress SARS-CoV-2/FLU/RSV testing.  Fact Sheet for Patients: EntrepreneurPulse.com.au  Fact Sheet for Healthcare Providers: IncredibleEmployment.be  This test is not yet approved or cleared by the [place] FDA and has been authorized for detection and/or diagnosis of SARS-CoV-2 by FDA under an Emergency Use Authorization (EUA). This EUA will remain in effect (meaning this test can be used) for the duration of the COVID-19 declaration under Section 564(b)(1) of the Act, 21 U.S.C. section 360bbb-3(b)(1), unless the authorization is terminated or revoked.  Performed at [facility], [place] [address], [place], [place] [address]      Signed: Marlowe Aschoff Jazmeen Axtell  [facility] 06/09/2021, 10:58 AM

## 2021-06-10 ENCOUNTER — Telehealth: Payer: Self-pay

## 2021-06-10 NOTE — Telephone Encounter (Signed)
Transition Care Management Unsuccessful Follow-up Telephone Call  Date of discharge and from where:  06/09/2021  [place]  Attempts:  1st Attempt  Reason for unsuccessful TCM follow-up call:  Left voice message   Keyerra Lamere, RN, BSN, CEN [facility] Community Care Coordinator [phone]    

## 2021-06-10 NOTE — Telephone Encounter (Signed)
Transition Care Management Unsuccessful Follow-up Telephone Call  Date of discharge and from where:  06/09/2021, [facility]   Attempts:  1st Attempt  Reason for unsuccessful TCM follow-up call:  Left voice message on # [phone].  Patient needs to schedule hospital follow up appointment

## 2021-06-11 ENCOUNTER — Telehealth: Payer: Self-pay

## 2021-06-11 NOTE — Telephone Encounter (Signed)
Transition Care Management Unsuccessful Follow-up Telephone Call  Date of discharge and from where:  06/09/2021, [facility]   Attempts:  2nd Attempt  Reason for unsuccessful TCM follow-up call:  Left voice message on # [phone]    Patient needs to schedule hospital follow up appointment

## 2021-06-12 ENCOUNTER — Telehealth: Payer: Self-pay

## 2021-06-12 NOTE — Telephone Encounter (Signed)
Transition Care Management Unsuccessful Follow-up Telephone Call  Date of discharge and from where:  06/09/2021, [facility]  Attempts:  3rd Attempt  Reason for unsuccessful TCM follow-up call:  Left voice message on #  [phone]     Patient needs to schedule hospital follow up appointment.  Letter sent requesting he contact [facility] to schedule this appointment.

## 2021-06-23 ENCOUNTER — Inpatient Hospital Stay (HOSPITAL_COMMUNITY): Payer: Self-pay

## 2021-06-23 ENCOUNTER — Emergency Department (HOSPITAL_COMMUNITY): Payer: Self-pay

## 2021-06-23 ENCOUNTER — Encounter (HOSPITAL_COMMUNITY): Payer: Self-pay

## 2021-06-23 ENCOUNTER — Inpatient Hospital Stay: Payer: Self-pay

## 2021-06-23 ENCOUNTER — Other Ambulatory Visit: Payer: Self-pay

## 2021-06-23 ENCOUNTER — Inpatient Hospital Stay (HOSPITAL_COMMUNITY)
Admission: EM | Admit: 2021-06-23 | Discharge: 2021-07-03 | DRG: 175 | Disposition: A | Discharge status: Home / Self Care | Payer: Self-pay | Attending: Internal Medicine | Admitting: Internal Medicine

## 2021-06-23 DIAGNOSIS — D631 Anemia in chronic kidney disease: Secondary | ICD-10-CM | POA: Diagnosis present

## 2021-06-23 DIAGNOSIS — E1165 Type 2 diabetes mellitus with hyperglycemia: Secondary | ICD-10-CM | POA: Diagnosis present

## 2021-06-23 DIAGNOSIS — N1831 Chronic kidney disease, stage 3a: Secondary | ICD-10-CM | POA: Diagnosis present

## 2021-06-23 DIAGNOSIS — Z820 Family history of epilepsy and other diseases of the nervous system: Secondary | ICD-10-CM

## 2021-06-23 DIAGNOSIS — N182 Chronic kidney disease, stage 2 (mild): Secondary | ICD-10-CM

## 2021-06-23 DIAGNOSIS — I152 Hypertension secondary to endocrine disorders: Secondary | ICD-10-CM

## 2021-06-23 DIAGNOSIS — Z833 Family history of diabetes mellitus: Secondary | ICD-10-CM

## 2021-06-23 DIAGNOSIS — I2699 Other pulmonary embolism without acute cor pulmonale: Principal | ICD-10-CM | POA: Diagnosis present

## 2021-06-23 DIAGNOSIS — F8081 Childhood onset fluency disorder: Secondary | ICD-10-CM

## 2021-06-23 DIAGNOSIS — I82412 Acute embolism and thrombosis of left femoral vein: Secondary | ICD-10-CM | POA: Diagnosis present

## 2021-06-23 DIAGNOSIS — Z20822 Contact with and (suspected) exposure to covid-19: Secondary | ICD-10-CM | POA: Diagnosis present

## 2021-06-23 DIAGNOSIS — R7989 Other specified abnormal findings of blood chemistry: Secondary | ICD-10-CM | POA: Diagnosis present

## 2021-06-23 DIAGNOSIS — R778 Other specified abnormalities of plasma proteins: Secondary | ICD-10-CM

## 2021-06-23 DIAGNOSIS — R0602 Shortness of breath: Secondary | ICD-10-CM | POA: Diagnosis not present

## 2021-06-23 DIAGNOSIS — Z7982 Long term (current) use of aspirin: Secondary | ICD-10-CM

## 2021-06-23 DIAGNOSIS — I10 Essential (primary) hypertension: Secondary | ICD-10-CM | POA: Diagnosis present

## 2021-06-23 DIAGNOSIS — Z803 Family history of malignant neoplasm of breast: Secondary | ICD-10-CM

## 2021-06-23 DIAGNOSIS — G4733 Obstructive sleep apnea (adult) (pediatric): Secondary | ICD-10-CM | POA: Diagnosis present

## 2021-06-23 DIAGNOSIS — I2609 Other pulmonary embolism with acute cor pulmonale: Secondary | ICD-10-CM

## 2021-06-23 DIAGNOSIS — R0902 Hypoxemia: Secondary | ICD-10-CM

## 2021-06-23 DIAGNOSIS — E1122 Type 2 diabetes mellitus with diabetic chronic kidney disease: Secondary | ICD-10-CM | POA: Diagnosis present

## 2021-06-23 DIAGNOSIS — E1159 Type 2 diabetes mellitus with other circulatory complications: Secondary | ICD-10-CM

## 2021-06-23 DIAGNOSIS — Z7901 Long term (current) use of anticoagulants: Secondary | ICD-10-CM

## 2021-06-23 DIAGNOSIS — Z6841 Body Mass Index (BMI) 40.0 and over, adult: Secondary | ICD-10-CM

## 2021-06-23 DIAGNOSIS — I82432 Acute embolism and thrombosis of left popliteal vein: Secondary | ICD-10-CM | POA: Diagnosis present

## 2021-06-23 DIAGNOSIS — I129 Hypertensive chronic kidney disease with stage 1 through stage 4 chronic kidney disease, or unspecified chronic kidney disease: Secondary | ICD-10-CM | POA: Diagnosis present

## 2021-06-23 DIAGNOSIS — D72829 Elevated white blood cell count, unspecified: Secondary | ICD-10-CM

## 2021-06-23 DIAGNOSIS — Z79899 Other long term (current) drug therapy: Secondary | ICD-10-CM

## 2021-06-23 DIAGNOSIS — J9601 Acute respiratory failure with hypoxia: Secondary | ICD-10-CM | POA: Diagnosis present

## 2021-06-23 DIAGNOSIS — K219 Gastro-esophageal reflux disease without esophagitis: Secondary | ICD-10-CM | POA: Diagnosis present

## 2021-06-23 DIAGNOSIS — Z794 Long term (current) use of insulin: Secondary | ICD-10-CM

## 2021-06-23 DIAGNOSIS — J9811 Atelectasis: Secondary | ICD-10-CM | POA: Diagnosis present

## 2021-06-23 DIAGNOSIS — N179 Acute kidney failure, unspecified: Secondary | ICD-10-CM | POA: Diagnosis present

## 2021-06-23 LAB — APTT
aPTT: 27 seconds (ref 24–36)
aPTT: 34 seconds (ref 24–36)
aPTT: 30 seconds (ref 24–36)

## 2021-06-23 LAB — BASIC METABOLIC PANEL
Anion gap: 9 (ref 5–15)
BUN: 18 mg/dL (ref 6–20)
CO2: 21 mmol/L — ABNORMAL LOW (ref 22–32)
Calcium: 8.7 mg/dL — ABNORMAL LOW (ref 8.9–10.3)
Chloride: 107 mmol/L (ref 98–111)
Creatinine, Ser: 1.51 mg/dL — ABNORMAL HIGH (ref 0.61–1.24)
GFR, Estimated: >60 mL/min (ref >60)
Glucose, Bld: 297 mg/dL — ABNORMAL HIGH (ref 70–99)
Potassium: 4 mmol/L (ref 3.5–5.1)
Sodium: 137 mmol/L (ref 135–145)

## 2021-06-23 LAB — COMPREHENSIVE METABOLIC PANEL
ALT: 27 U/L (ref 0–44)
ALT: 25 U/L (ref 0–44)
AST: 17 U/L (ref 15–41)
AST: 18 U/L (ref 15–41)
Albumin: 3.9 g/dL (ref 3.5–5.0)
Albumin: 3.7 g/dL (ref 3.5–5.0)
Alkaline Phosphatase: 95 U/L (ref 38–126)
Alkaline Phosphatase: 85 U/L (ref 38–126)
Anion gap: 8 (ref 5–15)
Anion gap: 11 (ref 5–15)
BUN: 20 mg/dL (ref 6–20)
BUN: 19 mg/dL (ref 6–20)
CO2: 21 mmol/L — ABNORMAL LOW (ref 22–32)
CO2: 18 mmol/L — ABNORMAL LOW (ref 22–32)
Calcium: 9.4 mg/dL (ref 8.9–10.3)
Calcium: 9.2 mg/dL (ref 8.9–10.3)
Chloride: 104 mmol/L (ref 98–111)
Chloride: 107 mmol/L (ref 98–111)
Creatinine, Ser: 1.37 mg/dL — ABNORMAL HIGH (ref 0.61–1.24)
Creatinine, Ser: 1.42 mg/dL — ABNORMAL HIGH (ref 0.61–1.24)
GFR, Estimated: >60 mL/min (ref >60)
GFR, Estimated: >60 mL/min (ref >60)
Glucose, Bld: 360 mg/dL — ABNORMAL HIGH (ref 70–99)
Glucose, Bld: 349 mg/dL — ABNORMAL HIGH (ref 70–99)
Potassium: 4.9 mmol/L (ref 3.5–5.1)
Potassium: 5.4 mmol/L — ABNORMAL HIGH (ref 3.5–5.1)
Sodium: 133 mmol/L — ABNORMAL LOW (ref 135–145)
Sodium: 136 mmol/L (ref 135–145)
Total Bilirubin: 1 mg/dL (ref 0.3–1.2)
Total Bilirubin: 0.7 mg/dL (ref 0.3–1.2)
Total Protein: 8.1 g/dL (ref 6.5–8.1)
Total Protein: 7.6 g/dL (ref 6.5–8.1)

## 2021-06-23 LAB — HEPARIN LEVEL (UNFRACTIONATED)
Heparin Unfractionated: 0.21 IU/mL — ABNORMAL LOW (ref 0.30–0.70)
Heparin Unfractionated: <0.1 IU/mL — ABNORMAL LOW (ref 0.30–0.70)

## 2021-06-23 LAB — BLOOD GAS, ARTERIAL
Acid-base deficit: 5.2 mmol/L — ABNORMAL HIGH (ref 0.0–2.0)
Acid-base deficit: 5.7 mmol/L — ABNORMAL HIGH (ref 0.0–2.0)
Bicarbonate: 17.6 mmol/L — ABNORMAL LOW (ref 20.0–28.0)
Bicarbonate: 18.2 mmol/L — ABNORMAL LOW (ref 20.0–28.0)
Delivery systems: POSITIVE
Drawn by: 560031
Expiratory PAP: 10
FIO2: 100
FIO2: 100
Inspiratory PAP: 14
Mode: POSITIVE
O2 Content: 15 L/min
O2 Saturation: 75.3 %
O2 Saturation: 85.9 %
Patient temperature: 98.6
Patient temperature: 98.6
pCO2 arterial: 29.6 mmHg — ABNORMAL LOW (ref 32.0–48.0)
pCO2 arterial: 30.9 mmHg — ABNORMAL LOW (ref 32.0–48.0)
pH, Arterial: 7.389 (ref 7.350–7.450)
pH, Arterial: 7.391 (ref 7.350–7.450)
pO2, Arterial: 44.7 mmHg — ABNORMAL LOW (ref 83.0–108.0)
pO2, Arterial: 54.9 mmHg — ABNORMAL LOW (ref 83.0–108.0)

## 2021-06-23 LAB — CBC WITH DIFFERENTIAL/PLATELET
Abs Immature Granulocytes: 0.04 10*3/uL (ref 0.00–0.07)
Basophils Absolute: 0.1 10*3/uL (ref 0.0–0.1)
Basophils Relative: 0 %
Eosinophils Absolute: 0.1 10*3/uL (ref 0.0–0.5)
Eosinophils Relative: 1 %
HCT: 43.8 % (ref 39.0–52.0)
Hemoglobin: 14.1 g/dL (ref 13.0–17.0)
Immature Granulocytes: 0 %
Lymphocytes Relative: 12 %
Lymphs Abs: 1.7 10*3/uL (ref 0.7–4.0)
MCH: 26.4 pg (ref 26.0–34.0)
MCHC: 32.2 g/dL (ref 30.0–36.0)
MCV: 81.9 fL (ref 80.0–100.0)
Monocytes Absolute: 0.8 10*3/uL (ref 0.1–1.0)
Monocytes Relative: 6 %
Neutro Abs: 11.1 10*3/uL — ABNORMAL HIGH (ref 1.7–7.7)
Neutrophils Relative %: 81 %
Platelets: 293 10*3/uL (ref 150–400)
RBC: 5.35 MIL/uL (ref 4.22–5.81)
RDW: 14.7 % (ref 11.5–15.5)
WBC: 13.8 10*3/uL — ABNORMAL HIGH (ref 4.0–10.5)
nRBC: 0 % (ref 0.0–0.2)

## 2021-06-23 LAB — MRSA NEXT GEN BY PCR, NASAL: MRSA by PCR Next Gen: NOT DETECTED

## 2021-06-23 LAB — CBC
HCT: 44.5 % (ref 39.0–52.0)
HCT: 37.2 % — ABNORMAL LOW (ref 39.0–52.0)
Hemoglobin: 14.7 g/dL (ref 13.0–17.0)
Hemoglobin: 12.2 g/dL — ABNORMAL LOW (ref 13.0–17.0)
MCH: 26.8 pg (ref 26.0–34.0)
MCH: 27 pg (ref 26.0–34.0)
MCHC: 33 g/dL (ref 30.0–36.0)
MCHC: 32.8 g/dL (ref 30.0–36.0)
MCV: 81.2 fL (ref 80.0–100.0)
MCV: 82.3 fL (ref 80.0–100.0)
Platelets: 243 10*3/uL (ref 150–400)
Platelets: 206 10*3/uL (ref 150–400)
RBC: 5.48 MIL/uL (ref 4.22–5.81)
RBC: 4.52 MIL/uL (ref 4.22–5.81)
RDW: 14.7 % (ref 11.5–15.5)
RDW: 14.8 % (ref 11.5–15.5)
WBC: 12.7 10*3/uL — ABNORMAL HIGH (ref 4.0–10.5)
WBC: 13.3 10*3/uL — ABNORMAL HIGH (ref 4.0–10.5)
nRBC: 0 % (ref 0.0–0.2)
nRBC: 0 % (ref 0.0–0.2)

## 2021-06-23 LAB — PROCALCITONIN: Procalcitonin: <0.1 ng/mL

## 2021-06-23 LAB — PROTIME-INR
INR: 1.1 (ref 0.8–1.2)
INR: 1.2 (ref 0.8–1.2)
INR: 1.3 — ABNORMAL HIGH (ref 0.8–1.2)
Prothrombin Time: 13.7 seconds (ref 11.4–15.2)
Prothrombin Time: 14.8 seconds (ref 11.4–15.2)
Prothrombin Time: 15.8 seconds — ABNORMAL HIGH (ref 11.4–15.2)

## 2021-06-23 LAB — GLUCOSE, CAPILLARY
Glucose-Capillary: 314 mg/dL — ABNORMAL HIGH (ref 70–99)
Glucose-Capillary: 299 mg/dL — ABNORMAL HIGH (ref 70–99)
Glucose-Capillary: 292 mg/dL — ABNORMAL HIGH (ref 70–99)
Glucose-Capillary: 276 mg/dL — ABNORMAL HIGH (ref 70–99)
Glucose-Capillary: 259 mg/dL — ABNORMAL HIGH (ref 70–99)
Glucose-Capillary: 232 mg/dL — ABNORMAL HIGH (ref 70–99)

## 2021-06-23 LAB — MAGNESIUM
Magnesium: 1.6 mg/dL — ABNORMAL LOW (ref 1.7–2.4)
Magnesium: 1.8 mg/dL (ref 1.7–2.4)

## 2021-06-23 LAB — ECHOCARDIOGRAM COMPLETE
Area-P 1/2: 2.75 cm2
Height: 69 in
Weight: 7232 oz

## 2021-06-23 LAB — LACTIC ACID, PLASMA
Lactic Acid, Venous: 1.2 mmol/L (ref 0.5–1.9)
Lactic Acid, Venous: 1.2 mmol/L (ref 0.5–1.9)

## 2021-06-23 LAB — TROPONIN I (HIGH SENSITIVITY)
Troponin I (High Sensitivity): 68 ng/L — ABNORMAL HIGH (ref <18)
Troponin I (High Sensitivity): 115 ng/L (ref <18)
Troponin I (High Sensitivity): 258 ng/L (ref <18)

## 2021-06-23 LAB — D-DIMER, QUANTITATIVE: D-Dimer, Quant: 2.43 ug/mL-FEU — ABNORMAL HIGH (ref 0.00–0.50)

## 2021-06-23 LAB — RESP PANEL BY RT-PCR (FLU A&B, COVID) ARPGX2
Influenza A by PCR: NEGATIVE
Influenza B by PCR: NEGATIVE
SARS Coronavirus 2 by RT PCR: NEGATIVE

## 2021-06-23 LAB — PHOSPHORUS: Phosphorus: 3.8 mg/dL (ref 2.5–4.6)

## 2021-06-23 LAB — CBG MONITORING, ED: Glucose-Capillary: 347 mg/dL — ABNORMAL HIGH (ref 70–99)

## 2021-06-23 LAB — BRAIN NATRIURETIC PEPTIDE: B Natriuretic Peptide: 69.6 pg/mL (ref 0.0–100.0)

## 2021-06-23 MED ORDER — ALTEPLASE (ACTIVASE) 10MG BOLUS + 40 MG INFUSION
50.0000 mg | Freq: Once | INTRAVENOUS | Status: AC
  Administered 2021-06-23: 50 mg via INTRAVENOUS
  Filled 2021-06-23 (×2): qty 50

## 2021-06-23 MED ORDER — HEPARIN (PORCINE) 25000 UT/250ML-% IV SOLN
2150.0000 [IU]/h | INTRAVENOUS | Status: DC
  Filled 2021-06-23: qty 250

## 2021-06-23 MED ORDER — INSULIN ASPART 100 UNIT/ML IJ SOLN
0.0000 [IU] | Freq: Every day | INTRAMUSCULAR | Status: DC
  Administered 2021-06-23: 4 [IU] via SUBCUTANEOUS
  Administered 2021-06-23 – 2021-06-24 (×2): 2 [IU] via SUBCUTANEOUS
  Administered 2021-06-25: 3 [IU] via SUBCUTANEOUS
  Administered 2021-06-26: 2 [IU] via SUBCUTANEOUS
  Administered 2021-06-27 – 2021-06-28 (×2): 3 [IU] via SUBCUTANEOUS
  Administered 2021-07-01: 2 [IU] via SUBCUTANEOUS
  Filled 2021-06-23: qty 0.05

## 2021-06-23 MED ORDER — LORAZEPAM 2 MG/ML IJ SOLN
INTRAMUSCULAR | Status: AC
  Filled 2021-06-23: qty 1

## 2021-06-23 MED ORDER — SODIUM CHLORIDE 0.9% FLUSH: 10.0000 mL | INTRAVENOUS | Status: DC | PRN

## 2021-06-23 MED ORDER — HEPARIN (PORCINE) 25000 UT/250ML-% IV SOLN
2500.0000 [IU]/h | INTRAVENOUS | Status: DC
  Administered 2021-06-23: 2150 [IU]/h via INTRAVENOUS
  Administered 2021-06-24 – 2021-06-26 (×7): 2500 [IU]/h via INTRAVENOUS
  Filled 2021-06-23 (×7): qty 250

## 2021-06-23 MED ORDER — INSULIN DETEMIR 100 UNIT/ML ~~LOC~~ SOLN
10.0000 [IU] | Freq: Every day | SUBCUTANEOUS | Status: DC
  Administered 2021-06-23: 10 [IU] via SUBCUTANEOUS
  Filled 2021-06-23: qty 0.1

## 2021-06-23 MED ORDER — LORAZEPAM 2 MG/ML IJ SOLN
0.5000 mg | Freq: Once | INTRAMUSCULAR | Status: AC
  Administered 2021-06-23: 0.5 mg via INTRAVENOUS

## 2021-06-23 MED ORDER — HEPARIN (PORCINE) 25000 UT/250ML-% IV SOLN
1900.0000 [IU]/h | INTRAVENOUS | Status: DC
  Administered 2021-06-23: 1900 [IU]/h via INTRAVENOUS
  Filled 2021-06-23: qty 250

## 2021-06-23 MED ORDER — INSULIN ASPART 100 UNIT/ML IJ SOLN
0.0000 [IU] | Freq: Three times a day (TID) | INTRAMUSCULAR | Status: DC
  Administered 2021-06-23 (×3): 8 [IU] via SUBCUTANEOUS
  Administered 2021-06-24: 4 [IU] via SUBCUTANEOUS
  Administered 2021-06-24 – 2021-06-25 (×3): 8 [IU] via SUBCUTANEOUS
  Administered 2021-06-25: 5 [IU] via SUBCUTANEOUS
  Administered 2021-06-25 – 2021-06-26 (×2): 8 [IU] via SUBCUTANEOUS
  Administered 2021-06-26: 5 [IU] via SUBCUTANEOUS
  Administered 2021-06-26: 3 [IU] via SUBCUTANEOUS
  Administered 2021-06-27: 15 [IU] via SUBCUTANEOUS
  Administered 2021-06-27: 3 [IU] via SUBCUTANEOUS
  Administered 2021-06-27 – 2021-06-28 (×2): 11 [IU] via SUBCUTANEOUS
  Administered 2021-06-28: 5 [IU] via SUBCUTANEOUS
  Administered 2021-06-28: 11 [IU] via SUBCUTANEOUS
  Administered 2021-06-29: 5 [IU] via SUBCUTANEOUS
  Filled 2021-06-23: qty 0.15

## 2021-06-23 MED ORDER — NALOXONE HCL 0.4 MG/ML IJ SOLN: 0.4000 mg | INTRAMUSCULAR | Status: DC | PRN

## 2021-06-23 MED ORDER — HYDRALAZINE HCL 20 MG/ML IJ SOLN
10.0000 mg | Freq: Four times a day (QID) | INTRAMUSCULAR | Status: DC | PRN
  Filled 2021-06-23: qty 1

## 2021-06-23 MED ORDER — SODIUM CHLORIDE 0.9 % IV SOLN
INTRAVENOUS | Status: DC | PRN
Start: 2021-06-23 — End: 2021-07-03

## 2021-06-23 MED ORDER — FUROSEMIDE 10 MG/ML IJ SOLN
40.0000 mg | Freq: Once | INTRAMUSCULAR | Status: AC
  Administered 2021-06-23: 40 mg via INTRAVENOUS
  Filled 2021-06-23: qty 4

## 2021-06-23 MED ORDER — HYDRALAZINE HCL 20 MG/ML IJ SOLN
5.0000 mg | Freq: Four times a day (QID) | INTRAMUSCULAR | Status: DC | PRN
  Administered 2021-06-24: 5 mg via INTRAVENOUS

## 2021-06-23 MED ORDER — HYDROCODONE-ACETAMINOPHEN 5-325 MG PO TABS
1.0000 | ORAL_TABLET | Freq: Four times a day (QID) | ORAL | Status: DC | PRN
  Administered 2021-06-23: 1 via ORAL
  Filled 2021-06-23: qty 1

## 2021-06-23 MED ORDER — ACETAMINOPHEN 650 MG RE SUPP: 650.0000 mg | Freq: Four times a day (QID) | RECTAL | Status: DC | PRN

## 2021-06-23 MED ORDER — CARVEDILOL 25 MG PO TABS
25.0000 mg | ORAL_TABLET | Freq: Two times a day (BID) | ORAL | Status: DC
  Administered 2021-06-23 – 2021-07-03 (×21): 25 mg via ORAL
  Filled 2021-06-23 (×2): qty 2
  Filled 2021-06-23: qty 1
  Filled 2021-06-23: qty 2
  Filled 2021-06-23 (×3): qty 1
  Filled 2021-06-23 (×2): qty 2
  Filled 2021-06-23: qty 1
  Filled 2021-06-23 (×6): qty 2
  Filled 2021-06-23: qty 1
  Filled 2021-06-23 (×2): qty 2
  Filled 2021-06-23: qty 1
  Filled 2021-06-23: qty 2
  Filled 2021-06-23: qty 1

## 2021-06-23 MED ORDER — AMLODIPINE BESYLATE 10 MG PO TABS
10.0000 mg | ORAL_TABLET | Freq: Every day | ORAL | Status: DC
  Administered 2021-06-23 – 2021-07-03 (×11): 10 mg via ORAL
  Filled 2021-06-23 (×11): qty 1

## 2021-06-23 MED ORDER — SODIUM CHLORIDE 0.9 % IV SOLN
250.0000 mL | Freq: Once | INTRAVENOUS | Status: AC
  Administered 2021-06-23: 250 mL via INTRAVENOUS

## 2021-06-23 MED ORDER — CLEVIDIPINE BUTYRATE 0.5 MG/ML IV EMUL
0.0000 mg/h | INTRAVENOUS | Status: DC
  Filled 2021-06-23: qty 50

## 2021-06-23 MED ORDER — CHLORHEXIDINE GLUCONATE CLOTH 2 % EX PADS
6.0000 | MEDICATED_PAD | Freq: Every day | CUTANEOUS | Status: DC
  Administered 2021-06-25 – 2021-07-02 (×9): 6 via TOPICAL

## 2021-06-23 MED ORDER — ORAL CARE MOUTH RINSE
15.0000 mL | Freq: Two times a day (BID) | OROMUCOSAL | Status: DC
  Administered 2021-06-24 – 2021-07-03 (×18): 15 mL via OROMUCOSAL

## 2021-06-23 MED ORDER — ORAL CARE MOUTH RINSE
15.0000 mL | Freq: Two times a day (BID) | OROMUCOSAL | Status: DC
  Administered 2021-06-23 – 2021-07-03 (×18): 15 mL via OROMUCOSAL

## 2021-06-23 MED ORDER — CHLORHEXIDINE GLUCONATE CLOTH 2 % EX PADS: 6.0000 | MEDICATED_PAD | Freq: Every day | CUTANEOUS | Status: DC

## 2021-06-23 MED ORDER — IOHEXOL 350 MG/ML SOLN
75.0000 mL | Freq: Once | INTRAVENOUS | Status: AC | PRN
  Administered 2021-06-23: 75 mL via INTRAVENOUS

## 2021-06-23 MED ORDER — PERFLUTREN LIPID MICROSPHERE
1.0000 mL | INTRAVENOUS | Status: AC | PRN
  Administered 2021-06-23: 2 mL via INTRAVENOUS

## 2021-06-23 MED ORDER — HEPARIN BOLUS VIA INFUSION
5000.0000 [IU] | Freq: Once | INTRAVENOUS | Status: AC
  Administered 2021-06-23: 5000 [IU] via INTRAVENOUS
  Filled 2021-06-23: qty 5000

## 2021-06-23 MED ORDER — HYDRALAZINE HCL 25 MG PO TABS
25.0000 mg | ORAL_TABLET | Freq: Four times a day (QID) | ORAL | Status: DC | PRN
  Administered 2021-06-23: 25 mg via ORAL
  Filled 2021-06-23 (×2): qty 1

## 2021-06-23 MED ORDER — CHLORHEXIDINE GLUCONATE CLOTH 2 % EX PADS
6.0000 | MEDICATED_PAD | Freq: Every day | CUTANEOUS | Status: DC
  Administered 2021-06-23: 6 via TOPICAL

## 2021-06-23 MED ORDER — HEPARIN BOLUS VIA INFUSION
2000.0000 [IU] | Freq: Once | INTRAVENOUS | Status: AC
  Administered 2021-06-23: 2000 [IU] via INTRAVENOUS
  Filled 2021-06-23: qty 2000

## 2021-06-23 MED ORDER — ACETAMINOPHEN 325 MG PO TABS
650.0000 mg | ORAL_TABLET | Freq: Four times a day (QID) | ORAL | Status: DC | PRN
  Filled 2021-06-23: qty 2

## 2021-06-23 MED ORDER — CHLORHEXIDINE GLUCONATE 0.12 % MT SOLN
15.0000 mL | Freq: Two times a day (BID) | OROMUCOSAL | Status: DC
  Administered 2021-06-23 – 2021-07-03 (×19): 15 mL via OROMUCOSAL
  Filled 2021-06-23 (×19): qty 15

## 2021-06-23 MED ORDER — MAGNESIUM SULFATE 2 GM/50ML IV SOLN
2.0000 g | Freq: Once | INTRAVENOUS | Status: AC
  Administered 2021-06-23: 2 g via INTRAVENOUS
  Filled 2021-06-23: qty 50

## 2021-06-23 MED ORDER — SODIUM CHLORIDE 0.9% FLUSH
10.0000 mL | Freq: Two times a day (BID) | INTRAVENOUS | Status: DC
  Administered 2021-06-23: 40 mL
  Administered 2021-06-23: 10 mL
  Administered 2021-06-24 (×2): 40 mL
  Administered 2021-06-25 – 2021-07-02 (×15): 10 mL

## 2021-06-23 MED ORDER — HYDRALAZINE HCL 50 MG PO TABS
50.0000 mg | ORAL_TABLET | Freq: Three times a day (TID) | ORAL | Status: DC
  Administered 2021-06-23 – 2021-07-03 (×29): 50 mg via ORAL
  Filled 2021-06-23 (×31): qty 1

## 2021-06-23 MED ORDER — ATORVASTATIN CALCIUM 40 MG PO TABS
40.0000 mg | ORAL_TABLET | Freq: Every day | ORAL | Status: DC
  Administered 2021-06-23 – 2021-07-03 (×11): 40 mg via ORAL
  Filled 2021-06-23 (×11): qty 1

## 2021-06-23 NOTE — Progress Notes (Signed)
Pt off BIPAP and placed on NRB per MD.  Pt tolerating well at this time.

## 2021-06-23 NOTE — Progress Notes (Signed)
Inpatient Diabetes Program Recommendations  AACE/ADA: New Consensus Statement on Inpatient Glycemic Control (2015)  Target Ranges:  Prepandial:   less than 140 mg/dL      Peak postprandial:   less than 180 mg/dL (1-2 hours)      Critically ill patients:  140 - 180 mg/dL   Lab Results  Component Value Date   GLUCAP 299 (H) 06/23/2021   HGBA1C 7.4 (H) 04/17/2021    Review of Glycemic Control Results for TEVION, LAFORGE (MRN [id number]) as of 06/23/2021 09:14  Ref. Range 06/23/2021 03:23 06/23/2021 07:52  Glucose-Capillary Latest Ref Range: 70 - 99 mg/dL 814 (H) 481 (H)   Diabetes history: DM 2 Outpatient Diabetes medications: Levemir 15 units, Metformin 1000 mg bid Current orders for Inpatient glycemic control:  Levemir 10 units Novolog 0-15 units tid + hs  A1c 7.4% on 8/18  Inpatient Diabetes Program Recommendations:    - Increase Levemir to 15 units (home dose)  Thanks,  Christena Deem RN, MSN, BC-ADM Inpatient Diabetes Coordinator Team Pager [phone] (8a-5p)

## 2021-06-23 NOTE — ED Notes (Signed)
Phone report called to receiving RN

## 2021-06-23 NOTE — ED Notes (Signed)
Dr. Read Drivers made aware pt is 51% on RA, pt placed on NRB 15L

## 2021-06-23 NOTE — Plan of Care (Signed)
Discussed with patient plan of care for the evening, pain management and admission questions with some teach back displayed.   Placed on Bariatric Bed and Bipap after coming up on NRBM and only holding at 78% with HFNC added.  MD on-call aware and in the room. PICC line needed.  Problem: Education: Goal: Knowledge of General Education information will improve Description: Including pain rating scale, medication(s)/side effects and non-pharmacologic comfort measures Outcome: Progressing   Problem: Pain Managment: Goal: General experience of comfort will improve Outcome: Progressing

## 2021-06-23 NOTE — ED Notes (Signed)
ED TO INPATIENT HANDOFF REPORT  Name/Age/Gender William Moss 35 y.o. male  Code Status    Code Status Orders  (From admission, onward)           Start     Ordered   06/23/21 0307  Full code  Continuous        06/23/21 0307           Code Status History     Date Active Date Inactive Code Status Order ID Comments User Context   06/07/2021 2104 06/09/2021 2018 Full Code [id number]  Briscoe Deutscher, MD ED   06/28/2019 0345 07/02/2019 1345 Full Code [id number]  Rometta Emery, MD ED   06/10/2016 1502 06/10/2016 2142 Full Code [id number]  Iran Ouch, MD Inpatient       Home/SNF/Other Home  Chief Complaint Acute pulmonary embolism (HCC) [I26.99]  Level of Care/Admitting Diagnosis ED Disposition     ED Disposition  Admit   Condition  --   Comment  Hospital Area: [facility] [[id number]]  Level of Care: Stepdown [14]  Admit to SDU based on following criteria: Severe physiological/psychological symptoms:  Any diagnosis requiring assessment & intervention at least every 4 hours on an ongoing basis to obtain desired patient outcomes including stability and rehabilitation  Admit to SDU based on following criteria: Respiratory Distress:  Frequent assessment and/or intervention to maintain adequate ventilation/respiration, pulmonary toilet, and respiratory treatment.  May admit patient to [facility] or [facility] if equivalent level of care is available:: No  Covid Evaluation: Confirmed COVID Negative  Diagnosis: Acute pulmonary embolism [facility]) [[id number]]  Admitting Physician: Angie Fava [[id number]]  Attending Physician: Angie Fava [[id number]]  Estimated length of stay: past midnight tomorrow  Certification:: I certify this patient will need inpatient services for at least 2 midnights          Medical History Past Medical History:  Diagnosis Date   Atypical chest pain 05/27/2016   a. 05/2016: NST showing small area of moderare  anteroapical ischemia b. 05/2016: cath showing tortous but normal cors which confirmed a false positive NST.   Diabetes mellitus without complication (HCC)    GERD (gastroesophageal reflux disease) 10/02/2016   Hypertension     Allergies Allergies  Allergen Reactions   Lisinopril-Hydrochlorothiazide Other (See Comments)    "Interfered with the pH level in the serum"    IV Location/Drains/Wounds Patient Lines/Drains/Airways Status     Active Line/Drains/Airways     Name Placement date Placement time Site Days   Peripheral IV 06/23/21 20 G 1" Left Antecubital 06/23/21  0032  Antecubital  less than 1            Labs/Imaging Results for orders placed or performed during the hospital encounter of 06/23/21 (from the past 48 hour(s))  CBC WITH DIFFERENTIAL     Status: Abnormal   Collection Time: 06/23/21 12:29 AM  Result Value Ref Range   WBC 13.8 (H) 4.0 - 10.5 K/uL   RBC 5.35 4.22 - 5.81 MIL/uL   Hemoglobin 14.1 13.0 - 17.0 g/dL   HCT 66.4 40.3 - 47.4 %   MCV 81.9 80.0 - 100.0 fL   MCH 26.4 26.0 - 34.0 pg   MCHC 32.2 30.0 - 36.0 g/dL   RDW 25.9 56.3 - 87.5 %   Platelets 293 150 - 400 K/uL   nRBC 0.0 0.0 - 0.2 %   Neutrophils Relative % 81 %   Neutro Abs 11.1 (H) 1.7 - 7.7  K/uL   Lymphocytes Relative 12 %   Lymphs Abs 1.7 0.7 - 4.0 K/uL   Monocytes Relative 6 %   Monocytes Absolute 0.8 0.1 - 1.0 K/uL   Eosinophils Relative 1 %   Eosinophils Absolute 0.1 0.0 - 0.5 K/uL   Basophils Relative 0 %   Basophils Absolute 0.1 0.0 - 0.1 K/uL   Immature Granulocytes 0 %   Abs Immature Granulocytes 0.04 0.00 - 0.07 K/uL    Comment: Performed at [facility], [address] [place], [place] [address]  Comprehensive metabolic panel     Status: Abnormal   Collection Time: 06/23/21 12:29 AM  Result Value Ref Range   Sodium 133 (L) 135 - 145 mmol/L   Potassium 4.9 3.5 - 5.1 mmol/L   Chloride 104 98 - 111 mmol/L   CO2 21 (L) 22 - 32 mmol/L   Glucose, Bld 360  (H) 70 - 99 mg/dL    Comment: Glucose reference range applies only to samples taken after fasting for at least 8 hours.   BUN 20 6 - 20 mg/dL   Creatinine, Ser 7.41 (H) 0.61 - 1.24 mg/dL   Calcium 9.4 8.9 - 63.8 mg/dL   Total Protein 8.1 6.5 - 8.1 g/dL   Albumin 3.9 3.5 - 5.0 g/dL   AST 17 15 - 41 U/L   ALT 27 0 - 44 U/L   Alkaline Phosphatase 95 38 - 126 U/L   Total Bilirubin 1.0 0.3 - 1.2 mg/dL   GFR, Estimated >45 >36 mL/min    Comment: (NOTE) Calculated using the CKD-EPI Creatinine Equation (2021)    Anion gap 8 5 - 15    Comment: Performed at [facility], [address] [place], [place] [address]  Brain natriuretic peptide     Status: None   Collection Time: 06/23/21 12:29 AM  Result Value Ref Range   B Natriuretic Peptide 69.6 0.0 - 100.0 pg/mL    Comment: Performed at [facility], [address], [place], [place] [address]  Blood gas, arterial (at [facility])     Status: Abnormal   Collection Time: 06/23/21 12:29 AM  Result Value Ref Range   FIO2 100.00    O2 Content 15.0 L/min   Delivery systems NON-REBREATHER OXYGEN MASK    pH, Arterial 7.391 7.350 - 7.450   pCO2 arterial 29.6 (L) 32.0 - 48.0 mmHg   pO2, Arterial 44.7 (L) 83.0 - 108.0 mmHg   Bicarbonate 17.6 (L) 20.0 - 28.0 mmol/L   Acid-base deficit 5.7 (H) 0.0 - 2.0 mmol/L   O2 Saturation 75.3 %   Patient temperature 98.6    Collection site RIGHT RADIAL    Allens test (pass/fail) PASS PASS    Comment: Performed at [facility], [address], [place], [place] [address]  Troponin I (High Sensitivity)     Status: Abnormal   Collection Time: 06/23/21 12:29 AM  Result Value Ref Range   Troponin I (High Sensitivity) 68 (H) <18 ng/L    Comment: (NOTE) Elevated high sensitivity troponin I (hsTnI) values and significant  changes across serial measurements may suggest ACS but many other  chronic and acute conditions are known to elevate hsTnI results.  Refer to  the "Links" section for chest pain algorithms and additional  guidance. Performed at [facility], [address] [place], [place] [address]   D-dimer, quantitative     Status: Abnormal   Collection Time: 06/23/21 12:31 AM  Result Value Ref Range   D-Dimer, Quant 2.43 (H) 0.00 - 0.50 ug/mL-FEU    Comment: (NOTE) At the manufacturer cut-off value of 0.5 g/mL FEU, this assay has a negative predictive value of 95-100%.This assay is intended for use in conjunction with a clinical pretest probability (PTP) assessment model to exclude pulmonary embolism (PE) and deep venous thrombosis (DVT) in outpatients suspected of PE or DVT. Results should be correlated with clinical presentation. Performed at [facility], [address], [place], [place] [address]   Protime-INR     Status: None   Collection Time: 06/23/21 12:31 AM  Result Value Ref Range   Prothrombin Time 13.7 11.4 - 15.2 seconds   INR 1.1 0.8 - 1.2    Comment: (NOTE) INR goal varies based on device and disease states. Performed at [facility], [address] [place], [place] [address]   APTT     Status: None   Collection Time: 06/23/21 12:31 AM  Result Value Ref Range   aPTT 27 24 - 36 seconds    Comment: Performed at [facility], [address] [place], [place] [address]  Resp Panel by RT-PCR (Flu A&B, Covid) Nasopharyngeal Swab     Status: None   Collection Time: 06/23/21 12:38 AM   Specimen: Nasopharyngeal Swab; Nasopharyngeal(NP) swabs in vial transport medium  Result Value Ref Range   SARS Coronavirus 2 by RT PCR NEGATIVE NEGATIVE    Comment: (NOTE) SARS-CoV-2 target nucleic acids are NOT DETECTED.  The SARS-CoV-2 RNA is generally detectable in upper respiratory specimens during the acute phase of infection. The lowest concentration of SARS-CoV-2 viral copies this assay can detect is 138 copies/mL. A negative result does not preclude  SARS-Cov-2 infection and should not be used as the sole basis for treatment or other patient management decisions. A negative result may occur with  improper specimen collection/handling, submission of specimen other than nasopharyngeal swab, presence of viral mutation(s) within the areas targeted by this assay, and inadequate number of viral copies(<138 copies/mL). A negative result must be combined with clinical observations, patient history, and epidemiological information. The expected result is Negative.  Fact Sheet for Patients:  BloggerCourse.com  Fact Sheet for Healthcare Providers:  SeriousBroker.it  This test is no t yet approved or cleared by the [place] FDA and  has been authorized for detection and/or diagnosis of SARS-CoV-2 by FDA under an Emergency Use Authorization (EUA). This EUA will remain  in effect (meaning this test can be used) for the duration of the COVID-19 declaration under Section 564(b)(1) of the Act, 21 U.S.C.section 360bbb-3(b)(1), unless the authorization is terminated  or revoked sooner.       Influenza A by PCR NEGATIVE NEGATIVE   Influenza B by PCR NEGATIVE NEGATIVE    Comment: (NOTE) The Xpert Xpress SARS-CoV-2/FLU/RSV plus assay is intended as an aid in the diagnosis of influenza from Nasopharyngeal swab specimens and should not be used as a sole basis for treatment. Nasal washings and aspirates are unacceptable for Xpert Xpress SARS-CoV-2/FLU/RSV testing.  Fact Sheet for Patients: BloggerCourse.com  Fact Sheet for Healthcare Providers: SeriousBroker.it  This test is not yet approved or cleared by the [place] FDA and has been authorized for detection and/or diagnosis of SARS-CoV-2 by FDA under an Emergency Use Authorization (EUA). This EUA will remain in effect (meaning this test can be used) for the duration of the COVID-19  declaration under Section 564(b)(1) of the Act, 21 U.S.C. section 360bbb-3(b)(1), unless  the authorization is terminated or revoked.  Performed at [facility], [address], [place], [place] [address]   Troponin I (High Sensitivity)     Status: Abnormal   Collection Time: 06/23/21  2:21 AM  Result Value Ref Range   Troponin I (High Sensitivity) 115 (HH) <18 ng/L    Comment: CRITICAL RESULT CALLED TO, READ BACK BY AND VERIFIED WITH: Katiejo Gilroy J @0346  BY BATTLET DELTA CHECK NOTED (NOTE) Elevated high sensitivity troponin I (hsTnI) values and significant  changes across serial measurements may suggest ACS but many other  chronic and acute conditions are known to elevate hsTnI results.  Refer to the Links section for chest pain algorithms and additional  guidance. Performed at [facility] [address], [place], [place]   Magnesium     Status: Abnormal   Collection Time: 06/23/21  2:21 AM  Result Value Ref Range   Magnesium 1.6 (L) 1.7 - 2.4 mg/dL    Comment: Performed at [facility], [address], [place], [place]  CBG monitoring, ED     Status: Abnormal   Collection Time: 06/23/21  3:23 AM  Result Value Ref Range   Glucose-Capillary 347 (H) 70 - 99 mg/dL    Comment: Glucose reference range applies only to samples taken after fasting for at least 8 hours.   CT Angio Chest PE W and/or Wo Contrast  Result Date: 06/23/2021 CLINICAL DATA:  Concern for pulmonary embolism. EXAM: CT ANGIOGRAPHY CHEST WITH CONTRAST TECHNIQUE: Multidetector CT imaging of the chest was performed using the standard protocol during bolus administration of intravenous contrast. Multiplanar CT image reconstructions and MIPs were obtained to evaluate the vascular anatomy. CONTRAST:  42mL OMNIPAQUE IOHEXOL 350 MG/ML SOLN COMPARISON:  Chest CT dated 06/07/2021 and radiograph dated 06/23/2021. FINDINGS: Cardiovascular: No cardiomegaly or  pericardial effusion. Mild dilatation of the right heart chambers. The right ventricular lumen measures 5.3 cm and the left ventricular lumen measures 3.2 cm for a ratio of 1.7 consistent with right heart straining. The thoracic aorta is unremarkable. Evaluation of the pulmonary arteries is very limited due to severe respiratory motion artifact. There are however bilateral lobar pulmonary artery emboli extending from the central branches into the upper and lower lobe lobar branches. Mediastinum/Nodes: No hilar or mediastinal adenopathy. The esophagus is grossly unremarkable. No mediastinal fluid collection. Lungs/Pleura: Patchy areas of consolidative change involving the left lower lobe, lingula, left upper lobe and right perihilar region may represent multilobar. Pulmonary infarcts are less likely but not excluded. No pleural effusion or pneumothorax. The central airways are patent. Upper Abdomen: Fatty liver. Musculoskeletal: No chest wall abnormality. No acute or significant osseous findings. Review of the MIP images confirms the above findings. IMPRESSION: 1. Bilateral lobar pulmonary artery emboli with CT evidence of right heart straining (RV/LV Ratio = 1.7) consistent with at least submassive (intermediate risk) PE. The presence of right heart strain has been associated with an increased risk of morbidity and mortality. 2. Bilateral pulmonary opacities may represent multilobar pneumonia. Pulmonary infarcts are less likely but not excluded. 3. Fatty liver. These results were called by telephone at the time of interpretation on 06/23/2021 at 2:23 am to provider [facility] , who verbally acknowledged these results. Electronically Signed   By: [facility] M.D.   On: 06/23/2021 02:27   DG Chest Port 1 View  Result Date: 06/23/2021 CLINICAL DATA:  Shortness of breath EXAM: PORTABLE CHEST 1 VIEW COMPARISON:  06/09/2021 FINDINGS: Patchy  left lower lobe airspace disease. Right lung clear. Heart is normal  size. No effusions or acute bony abnormality. IMPRESSION: Left lower lobe airspace disease compatible with pneumonia. Electronically Signed   By: Charlett Nose M.D.   On: 06/23/2021 00:42    Pending Labs Unresulted Labs (From admission, onward)     Start     Ordered   06/24/21 0500  CBC  Daily,   R      06/23/21 0316   06/23/21 1000  Heparin level (unfractionated)  Once-Timed,   TIMED        06/23/21 0316   06/23/21 0900  Blood gas, arterial  Once-Timed,   R        06/23/21 0419   06/23/21 0500  Magnesium  Tomorrow morning,   R       Question:  Specimen collection method  Answer:  Lab=Lab collect   06/23/21 0308   06/23/21 0500  Comprehensive metabolic panel  Tomorrow morning,   R       Question:  Specimen collection method  Answer:  Lab=Lab collect   06/23/21 0308   06/23/21 0500  CBC  Tomorrow morning,   R       Question:  Specimen collection method  Answer:  Lab=Lab collect   06/23/21 0308   06/23/21 0500  Phosphorus  Tomorrow morning,   R       Question:  Specimen collection method  Answer:  Lab=Lab collect   06/23/21 0308   06/23/21 0419  Procalcitonin - Baseline  Add-on,   AD        06/23/21 0418            Vitals/Pain Today's Vitals   06/23/21 0216 06/23/21 0230 06/23/21 0235 06/23/21 0250  BP: (!) 160/102 (!) 164/101 (!) 169/98 (!) 163/100  Pulse: (!) 118  (!) 107 (!) 107  Resp: (!) 25 (!) 28 (!) 31 (!) 27  Temp:      TempSrc:      SpO2: (!) 88%  91% 91%  Weight:      Height:      PainSc:        Isolation Precautions No active isolations  Medications Medications  acetaminophen (TYLENOL) tablet 650 mg (has no administration in time range)    Or  acetaminophen (TYLENOL) suppository 650 mg (has no administration in time range)  insulin aspart (novoLOG) injection 0-15 Units (0 Units Subcutaneous Not Given 06/23/21 0345)  insulin aspart (novoLOG) injection 0-5 Units (has no administration in time range)  heparin ADULT infusion 100 units/mL (25000  units/276mL) (1,900 Units/hr Intravenous New Bag/Given 06/23/21 0342)  insulin detemir (LEVEMIR) injection 10 Units (has no administration in time range)  magnesium sulfate IVPB 2 g 50 mL (has no administration in time range)  iohexol (OMNIPAQUE) 350 MG/ML injection 75 mL (75 mLs Intravenous Contrast Given 06/23/21 0155)  heparin bolus via infusion 5,000 Units (5,000 Units Intravenous Bolus from Bag 06/23/21 0344)    Mobility walks

## 2021-06-23 NOTE — Progress Notes (Addendum)
eLink Physician-Brief Progress Note Patient Name: William Moss DOB: 1985/09/06 MRN: [id number]   Date of Service  06/23/2021  HPI/Events of Note  Clark L Page, hand off to keep close watch on [place].  Dr Langston Masker, on call is also aware.  Camera: Morbidly obese, stable hemodynamically. On heparin gtt. HR 87, 126/87, sats 93% on BiPAP 20/5/10/100%. Ve > 26 lit.   Data: K was 5.4 and cr 1.4 from AM. LA x 2 normal. Pro cal neg. Trop doubled from 115 to 258 in AM. LFT normal 13.3 Hg 12.2 pH normal.  ECHO limited window.  Sub massive PE ( Father also had PE), Morbid obesity BMI > 70. Pneumonia. Hypertensive. S/p half dose tPA.   HTN DM type 2   eICU Interventions  - continue current care plan - follow labs in AM. - follow heparin gtt protocol, aPTT goal over 50. High risk for worsening overnight.  - aspiration precautions. - hypertensive all along, did drop post tPA. Was on clevprex, not now. - need hypercoagulable work up with family hx of PE.   - discussed about shifting to [facility] ICU, but on 100% BiPAP, not stable enough to shift with morbid obesity and desaturates fast if BiPAP is off.   Discussed with bed side RN; Ordered BMP, for hyperkalemia.      Intervention Category Intermediate Interventions: Communication with other healthcare providers and/or family;Respiratory distress - evaluation and management  Ranee Gosselin 06/23/2021, 7:24 PM

## 2021-06-23 NOTE — Progress Notes (Signed)
[facility] consulted to obtain IV access. However, PICC RN is on campus and headed to patient to place a PICC as ordered.

## 2021-06-23 NOTE — Progress Notes (Signed)
Pt currently on BIPAP w/settings of 10/5 BUR 12, FIO2 100%. Pt tolerating BIPAP at this time. Respiratory status stable w/no distress noted at this time. RT will continue to monitor.

## 2021-06-23 NOTE — Progress Notes (Signed)
ANTICOAGULATION CONSULT NOTE - Follow Up Consult  Pharmacy Consult for Heparin Indication: pulmonary embolus  Allergies  Allergen Reactions   Lisinopril-Hydrochlorothiazide Other (See Comments)    "Interfered with the pH level in the serum"    Patient Measurements: Height: 5\' 9"  (175.3 cm) Weight: (!) 205 kg (452 lb) IBW/kg (Calculated) : 70.7 Heparin Dosing Weight: 113.6 kg  Vital Signs: Temp: 99.7 F (37.6 C) (10/24 1200) Temp Source: Axillary (10/24 1200) BP: 123/71 (10/24 1730) Pulse Rate: 88 (10/24 1730)  Labs: Recent Labs    06/23/21 0029 06/23/21 0031 06/23/21 0221 06/23/21 0552 06/23/21 0953 06/23/21 1338 06/23/21 1635  HGB 14.1  --   --  14.7  --   --  12.2*  HCT 43.8  --   --  44.5  --   --  37.2*  PLT 293  --   --  243  --   --  206  APTT  --  27  --   --   --  34 30  LABPROT  --  13.7  --   --   --  14.8 15.8*  INR  --  1.1  --   --   --  1.2 1.3*  HEPARINUNFRC  --   --   --   --  0.21*  --  <0.10*  CREATININE 1.37*  --   --  1.42*  --   --   --   TROPONINIHS 68*  --  115*  --  258*  --   --      Estimated Creatinine Clearance: 126.5 mL/min (A) (by C-G formula based on SCr of 1.42 mg/dL (H)).   Medications:  Infusions:   sodium chloride 10 mL/hr at 06/23/21 1700   clevidipine Stopped (06/23/21 1343)    Assessment: 34 yoM admitted on 10/24 with SOB and chest pain, found to have bilateral PE with right heart strain.  He was recently admitted for pneumonia (10/8-10/10).  No prior to admission anticoagulation noted. D-dimer 2.43 (10/24). Baseline coags:  INR 1.1, PTT 27.  Pharmacy consulted to dose IV heparin.  TPA given per CCM.  Today, 06/23/2021: Heparin off on 10/24 at 12:39 TPA 50mg  low dose infusion started at 14:03 PTT 30 minutes after completion of infusion = 30 seconds Hgb 12.2 (decreased), Plts WNL  Goal of Therapy:  Heparin level 0.3-0.5 for the first 24 hours after alteplase infusion Monitor platelets by anticoagulation protocol:  Yes   Plan: Resume heparin infusion 2150 units/hr Check heparin level in 6 hours Daily heparin level and CBC Monitor closely for signs/symptoms of bleeding  , PharmD, BCPS [facility] main pharmacy [phone] 06/23/2021 6:03 PM

## 2021-06-23 NOTE — ED Notes (Signed)
Attempted to call report, was told charge nurse would call this RN back

## 2021-06-23 NOTE — Consult Note (Signed)
NAME:  William Moss, MRN:  [id number], DOB:  13-Jun-1986, LOS: 0 ADMISSION DATE:  06/23/2021, CONSULTATION DATE:  10/24 REFERRING MD:  Margarite Gouge, CHIEF COMPLAINT:  submassive PE, hypoxic respiratory failure  History of Present Illness:  William Moss is a 35 year old gentleman with a history of recent pneumonia who presented with sudden onset shortness of breath and chest tightness yesterday.  He had some dizziness before presentation. Since admission he is feeling mildly improved on BiPAP.  He was hospitalized recently with community-acquired pneumonia, but was discharged from the hospital not on home oxygen.  He has a family history of PE in his father, which was fatal.  No previous personal history of clots.  He has not had any bleeding, no history of stroke or CNS surgery.  No previous brain hemorrhage.  Upon presentation to the ED he had saturations in the high 40s.  CT demonstrated bilateral lower lobe PEs.  CT demonstrated dilated RV and RA.  He was started empirically on IV heparin.  He has been hypertensive since admission.  Pertinent  Medical History  Recent CAP- hospitalized  Significant Hospital Events: Including procedures, antibiotic start and stop dates in addition to other pertinent events   10/23 admitted 10/24 getting peripheral TPA  Interim History / Subjective:    Objective   Blood pressure (!) 175/103, pulse (!) 102, temperature 99.7 F (37.6 C), temperature source Axillary, resp. rate (!) 28, height $RemoveBe'5\' 9"'vADjlHKVE$  (1.753 m), weight (!) 205 kg, SpO2 (!) 89 %.    FiO2 (%):  [100 %] 100 %   Intake/Output Summary (Last 24 hours) at 06/23/2021 1231 Last data filed at 06/23/2021 0630 Gross per 24 hour  Intake 98.34 ml  Output --  Net 98.34 ml   Filed Weights   06/23/21 0023 06/23/21 0500  Weight: (!) 172.4 kg (!) 205 kg    Examination: General: chronically ill appearing man lying in bed in NAD HENT: [place]/AT, eyes anicteric, bipap mask on Lungs: tachypneic, no accessory  muscle use, distant lung sounds Cardiovascular: S1S2, tachycardic, reg rhythm Abdomen: obese, soft, NT Extremities: +LE edema, no cyanosis or clubbing Neuro: awake, alert, moving all extremities, answering questions appropriately Derm: warm, dry, no petechiae or ecchymoses  Echo> LVEF 65%, unable to assess RV RA due to poor windows. Trop 68> 115>258 BNP 69.6  CTA reviewed> persistent opacities bilaterally, bilateral lower lobe clots  Resolved Hospital Problem list     Assessment & Plan:  Acute respiratory failure with hypoxia Submassive PE with RV dilation on CT; echo unable to assess RV function and size. His rising troponin is of concern. BNP likely artificially low due to BMI. PESI class II, but I feel that based on ongoing CP and SOB with profound hypoxia, he is likely higher risk than what his risk score would predict, especially with baseline severe obesity that could limit his cardiopulmonary reserve. -Check Lactic acid -Consult IR> no amenable to embolectomy -Recommend low-dose peripheral TPA. Discussed with him that this is associated with faster symptom resolution and not long-term improvement in outcomes. He understands that there is bleeding risk associated with this, and we will make sure his BP remains <180. He will resume heparin after per pharmacy monitoring. He verbally consented for TPA.  -will need 3-6 months AC; even though this PE seems provoked from recent hospitalization, with his family history it may be advisable to have a hypercoagulability workup done when he can safely come off [facility] given his family history of VTE -wean O2 as able  to maintain SpO2>90%; currently on BiPAP  Hypertension -IV hydralazine added to quickly drop BP to safe range -con't PO antihypertensives -when more IV access can add cleviprex if needed  Obesity -long term recommend weight loss  Hyperglycemia -per primary  Best Practice (right click and "Reselect all SmartList Selections"  daily)   Diet/type: NPO DVT prophylaxis: systemic heparin GI prophylaxis: N/A Lines: yes and it is still needed Foley:  N/A Code Status:  full code Last date of multidisciplinary goals of care discussion [discussed with patient at bedside 10/24]  Labs   CBC: Recent Labs  Lab 06/23/21 0029 06/23/21 0552  WBC 13.8* 12.7*  NEUTROABS 11.1*  --   HGB 14.1 14.7  HCT 43.8 44.5  MCV 81.9 81.2  PLT 293 381    Basic Metabolic Panel: Recent Labs  Lab 06/23/21 0029 06/23/21 0221 06/23/21 0552  NA 133*  --  136  K 4.9  --  5.4*  CL 104  --  107  CO2 21*  --  18*  GLUCOSE 360*  --  349*  BUN 20  --  19  CREATININE 1.37*  --  1.42*  CALCIUM 9.4  --  9.2  MG  --  1.6* 1.8  PHOS  --   --  3.8   GFR: Estimated Creatinine Clearance: 126.5 mL/min (A) (by C-G formula based on SCr of 1.42 mg/dL (H)). Recent Labs  Lab 06/23/21 0029 06/23/21 0552  PROCALCITON  --  <0.10  WBC 13.8* 12.7*    Liver Function Tests: Recent Labs  Lab 06/23/21 0029 06/23/21 0552  AST 17 18  ALT 27 25  ALKPHOS 95 85  BILITOT 1.0 0.7  PROT 8.1 7.6  ALBUMIN 3.9 3.7   No results for input(s): LIPASE, AMYLASE in the last 168 hours. No results for input(s): AMMONIA in the last 168 hours.  ABG    Component Value Date/Time   PHART 7.389 06/23/2021 0910   PCO2ART 30.9 (L) 06/23/2021 0910   PO2ART 54.9 (L) 06/23/2021 0910   HCO3 18.2 (L) 06/23/2021 0910   TCO2 22 07/09/2015 1423   ACIDBASEDEF 5.2 (H) 06/23/2021 0910   O2SAT 85.9 06/23/2021 0910     Coagulation Profile: Recent Labs  Lab 06/23/21 0031  INR 1.1    Cardiac Enzymes: No results for input(s): CKTOTAL, CKMB, CKMBINDEX, TROPONINI in the last 168 hours.  HbA1C: HbA1c, POC (controlled diabetic range)  Date/Time Value Ref Range Status  10/07/2020 10:25 AM 13.4 (A) 0.0 - 7.0 % Final  05/30/2020 09:56 AM 6.6 0.0 - 7.0 % Final   Hgb A1c MFr Bld  Date/Time Value Ref Range Status  04/17/2021 10:11 AM 7.4 (H) 4.8 - 5.6 % Final     Comment:             Prediabetes: 5.7 - 6.4          Diabetes: >6.4          Glycemic control for adults with diabetes: <7.0     CBG: Recent Labs  Lab 06/23/21 0323 06/23/21 0752  GLUCAP 347* 299*    Review of Systems:   Review of Systems  Constitutional:  Negative for fever and weight loss.  HENT: Negative.    Respiratory:  Positive for sputum production and shortness of breath.   Cardiovascular:  Positive for chest pain.  Gastrointestinal: Negative.  Negative for blood in stool and melena.  Genitourinary: Negative.  Negative for hematuria.  Musculoskeletal: Negative.   Skin:  Negative for rash.  Neurological: Negative.  Negative for loss of consciousness.    Past Medical History:  He,  has a past medical history of Atypical chest pain (05/27/2016), Diabetes mellitus without complication ([place]), GERD (gastroesophageal reflux disease) (10/02/2016), and Hypertension.   Surgical History:   Past Surgical History:  Procedure Laterality Date   CARDIAC CATHETERIZATION N/A 06/10/2016   Procedure: Left Heart Cath and Coronary Angiography;  Surgeon: Wellington Hampshire, MD;  Location: [facility];  Service: Cardiovascular;  Laterality: N/A;   COLONOSCOPY N/A 02/04/2016   Procedure: COLONOSCOPY;  Surgeon: Irene Shipper, MD;  Location: [facility];  Service: Endoscopy;  Laterality: N/A;   NO PAST SURGERIES       Social History:   reports that he has never smoked. He has never used smokeless tobacco. He reports that he does not drink alcohol and does not use drugs.   Family History:  His family history includes Alzheimer's disease in his paternal grandmother; Breast cancer in his cousin; Cancer in his maternal grandmother; Diabetes in his father and mother; Pulmonary embolism in his father.   Allergies Allergies  Allergen Reactions   Lisinopril-Hydrochlorothiazide Other (See Comments)    "Interfered with the pH level in the serum"     Home Medications  Prior to Admission  medications   Medication Sig Start Date End Date Taking? Authorizing Provider  amLODipine (NORVASC) 10 MG tablet Take 1 tablet (10 mg total) by mouth daily. 04/07/21 07/06/21 Yes Charlott Rakes, MD  aspirin 325 MG EC tablet Take 325 mg by mouth daily as needed for pain (or headaches).   Yes [provider]  atorvastatin (LIPITOR) 40 MG tablet Take 1 tablet (40 mg total) by mouth daily. 04/10/21  Yes Charlott Rakes, MD  carvedilol (COREG) 25 MG tablet Take 1 tablet (25 mg total) by mouth 2 (two) times daily. 04/10/21  Yes Charlott Rakes, MD  hydrochlorothiazide (MICROZIDE) 12.5 MG capsule Take 1 capsule (12.5 mg total) by mouth daily. 06/09/21 09/07/21 Yes Dahal, Marlowe Aschoff, MD  insulin detemir (LEVEMIR FLEXTOUCH) 100 UNIT/ML FlexPen Inject 15 Units into the skin at bedtime. 06/09/21  Yes Dahal, Marlowe Aschoff, MD  metFORMIN (GLUCOPHAGE) 500 MG tablet Take 2 tablets (1,000 mg total) by mouth 2 (two) times daily with a meal. 04/10/21  Yes Newlin, Enobong, MD  Blood Glucose Monitoring Suppl (ONETOUCH VERIO REFLECT) w/Device KIT Check blood sugar TID E11.69 10/07/20   Charlott Rakes, MD  exenatide (BYETTA 5 MCG PEN) 5 MCG/0.02ML SOPN injection Inject 5 mcg into the skin 2 (two) times daily with a meal. Patient not taking: No sig reported 04/11/21   Charlott Rakes, MD  Insulin Pen Needle (BD PEN NEEDLE NANO U/F) 32G X 4 MM MISC 10 Units by Does not apply route daily. 10/07/20   Charlott Rakes, MD  Ipratropium-Albuterol (COMBIVENT) 20-100 MCG/ACT AERS respimat Inhale 1 puff into the lungs 4 (four) times daily. Patient not taking: No sig reported 07/02/19   Allie Bossier, MD  Lancets [facility] DELICA PLUS [id number]) MISC CHECK BLOOD SUGAR 3 TIMES A DAY 01/13/21   Charlott Rakes, MD  lisinopril-hydrochlorothiazide (ZESTORETIC) 20-12.5 MG tablet Take 2 tablets by mouth daily. 07/22/20 08/27/20  Charlott Rakes, MD     Critical care time: 35 min.

## 2021-06-23 NOTE — Progress Notes (Addendum)
ANTICOAGULATION CONSULT NOTE - Follow Up Consult  Pharmacy Consult for Heparin Indication: pulmonary embolus  Allergies  Allergen Reactions   Lisinopril-Hydrochlorothiazide Other (See Comments)    "Interfered with the pH level in the serum"    Patient Measurements: Height: 5\' 9"  (175.3 cm) Weight: (!) 205 kg (452 lb) IBW/kg (Calculated) : 70.7 Heparin Dosing Weight: 113.6 kg  Vital Signs: Temp: 98.6 F (37 C) (10/24 0510) Temp Source: Oral (10/24 0510) BP: 200/121 (10/24 0700) Pulse Rate: 108 (10/24 0700)  Labs: Recent Labs    06/23/21 0029 06/23/21 0031 06/23/21 0221 06/23/21 0552  HGB 14.1  --   --  14.7  HCT 43.8  --   --  44.5  PLT 293  --   --  243  APTT  --  27  --   --   LABPROT  --  13.7  --   --   INR  --  1.1  --   --   CREATININE 1.37*  --   --  1.42*  TROPONINIHS 68*  --  115*  --     Estimated Creatinine Clearance: 126.5 mL/min (A) (by C-G formula based on SCr of 1.42 mg/dL (H)).   Medications:  Infusions:   heparin 1,900 Units/hr (06/23/21 0342)    Assessment: 34 yoM admitted on 10/24 with SOB and chest pain, found to have bilateral PE with right heart strain.  He was recently admitted for pneumonia (10/8-10/10).  No prior to admission anticoagulation noted. D-dimer 2.43 (10/24) Baseline coags:  INR 1.1, PTT 27  Today, 06/23/2021: Heparin level: 0.21, subtherapeutic at first level on Heparin 1900 unit/hr CBC: Hgb and Plt WNL No bleeding or complications reported.  No infusion problems or pauses.    Goal of Therapy:  Heparin level 0.3-0.7 units/ml Monitor platelets by anticoagulation protocol: Yes   Plan:  Give heparin 2000 units bolus IV x 1 Increase to heparin IV infusion at 2150 units/hr Heparin level 6 hours after rate change.   Daily heparin level and CBC.   06/25/2021 PharmD, BCPS Clinical Pharmacist [facility] [phone] 06/23/2021 7:24 AM   Plan: Alteplase infusion planned (low dose 50mg ) Heparin off on 10/24  at 12:39 Alteplase infusion started at 14:00 Check aPTT, PT/INR, and CBC 30 min after tPA completion.  Re-start heparin (no bolus) when aPTT <80. If aPTT ?80, recheck every 2 hours until <80 seconds.  Heparin level goal 0.3-0.5 for the first 24 hours.    PharmD, BCPS Clinical Pharmacist [facility] [phone] 06/23/2021 2:21 PM

## 2021-06-23 NOTE — Progress Notes (Signed)
PROGRESS NOTE  William Moss PZW:[id number] DOB: [birth year] DOA: 06/23/2021 PCP: Hoy Register, MD  HPI/Recap of past 75 hours: 35 year old male with past medical history of morbid obesity with a BMI of 66, hypertension and diabetes mellitus who had had a recent admission for pneumonia 2 weeks prior admitted on the early morning of 10/24 for severe respiratory failure with hypoxia secondary to pulmonary embolus.  Patient admitted to the hospitalist service and started on IV heparin.  Following admission, patient placed in ICU and is requiring BiPAP.  Blood pressure is markedly elevated with a systolic in the 160s to 190s.  Patient complains of some left-sided chest pain with deep inspiration.  He feels a little bit more calm, breathing easier on the BiPAP.  Assessment/Plan: Principal Problem:   Acute pulmonary embolism (HCC) causing acute respiratory failure with hypoxia: Supplemental oxygen on BiPAP.  Continue heparin.  No evidence of further infection.  Procalcitonin normal.  BNP only 70 although may be falsely low due to patient's severe obesity.  Echocardiogram done last hospitalization a few weeks ago noted indeterminate diastolic features.  Checking repeat echo to evaluate for right-sided heart failure.  Have given 1 dose of Lasix to see if any of this is from volume overload.  Case discussed with pulmonary critical care.  Interventional radiology consulted to see if patient would be a candidate for any kind of partial and embolectomy. Active Problems:   Essential hypertension: Blood pressure significantly elevated.  Some of this may be due to some volume overload.  We will try Lasix.    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  Renal function at baseline.    Morbid obesity with BMI of 60.0-69.9, adult (HCC)   Elevated troponin: No evidence of acute ACS.  Elevated in the setting of PE and possibly CHF    Hypomagnesemia: Replacing as  needed    Leukocytosis: As above, no evidence of infection.  Likely stress margination.   Code Status: Full code  Family Communication: No family listed on chart.  Disposition Plan: Discharge when converted from IV heparin to p.o. and oxygenation stable   Consultants: None  Procedures: None  Antimicrobials: None  DVT prophylaxis: Heparin  Level of care: Stepdown   Objective: Vitals:   06/23/21 0730 06/23/21 0829  BP: (!) 195/101 (!) 189/94  Pulse: (!) 109 (!) 109  Resp: (!) 30 (!) 31  Temp:    SpO2: 91% 90%    Intake/Output Summary (Last 24 hours) at 06/23/2021 0901 Last data filed at 06/23/2021 0629 Gross per 24 hour  Intake 98.34 ml  Output --  Net 98.34 ml   Filed Weights   06/23/21 0023 06/23/21 0500  Weight: (!) 172.4 kg (!) 205 kg   Body mass index is 66.75 kg/m.  Exam:  General: Alert and oriented x3, currently in no acute distress HEENT: Normocephalic, atraumatic, on BiPAP.  Narrow airway Cardiovascular: Regular rate and rhythm, S1-S2 Respiratory: Decreased breath sounds throughout secondary to body habitus Abdomen: Soft, morbidly obese, nontender, NABS Musculoskeletal: No clubbing or cyanosis, 1+ pitting edema from the knees down bilaterally Skin: No skin breaks, tears or lesions Psychiatry: Appropriate, no evidence of psychoses Neurology: No focal deficits   Data Reviewed: CBC: Recent Labs  Lab 06/23/21 0029 06/23/21 0552  WBC 13.8* 12.7*  NEUTROABS 11.1*  --   HGB 14.1 14.7  HCT 43.8 44.5  MCV 81.9 81.2  PLT 293 243   Basic Metabolic Panel:  Recent Labs  Lab 06/23/21 0029 06/23/21 0221 06/23/21 0552  NA 133*  --  136  K 4.9  --  5.4*  CL 104  --  107  CO2 21*  --  18*  GLUCOSE 360*  --  349*  BUN 20  --  19  CREATININE 1.37*  --  1.42*  CALCIUM 9.4  --  9.2  MG  --  1.6* 1.8  PHOS  --   --  3.8   GFR: Estimated Creatinine Clearance: 126.5 mL/min (A) (by C-G formula based on SCr of 1.42 mg/dL (H)). Liver Function  Tests: Recent Labs  Lab 06/23/21 0029 06/23/21 0552  AST 17 18  ALT 27 25  ALKPHOS 95 85  BILITOT 1.0 0.7  PROT 8.1 7.6  ALBUMIN 3.9 3.7   No results for input(s): LIPASE, AMYLASE in the last 168 hours. No results for input(s): AMMONIA in the last 168 hours. Coagulation Profile: Recent Labs  Lab 06/23/21 0031  INR 1.1   Cardiac Enzymes: No results for input(s): CKTOTAL, CKMB, CKMBINDEX, TROPONINI in the last 168 hours. BNP (last 3 results) No results for input(s): PROBNP in the last 8760 hours. HbA1C: No results for input(s): HGBA1C in the last 72 hours. CBG: Recent Labs  Lab 06/23/21 0323 06/23/21 0752  GLUCAP 347* 299*   Lipid Profile: No results for input(s): CHOL, HDL, LDLCALC, TRIG, CHOLHDL, LDLDIRECT in the last 72 hours. Thyroid Function Tests: No results for input(s): TSH, T4TOTAL, FREET4, T3FREE, THYROIDAB in the last 72 hours. Anemia Panel: No results for input(s): VITAMINB12, FOLATE, FERRITIN, TIBC, IRON, RETICCTPCT in the last 72 hours. Urine analysis:    Component Value Date/Time   COLORURINE STRAW (A) 01/18/2019 1840   APPEARANCEUR CLEAR 01/18/2019 1840   LABSPEC 1.021 01/18/2019 1840   PHURINE 5.0 01/18/2019 1840   GLUCOSEU >=500 (A) 01/18/2019 1840   HGBUR NEGATIVE 01/18/2019 1840   BILIRUBINUR negative 10/07/2020 1057   KETONESUR negative 10/07/2020 1057   KETONESUR NEGATIVE 01/18/2019 1840   PROTEINUR NEGATIVE 01/18/2019 1840   UROBILINOGEN 0.2 10/07/2020 1057   NITRITE Negative 10/07/2020 1057   NITRITE NEGATIVE 01/18/2019 1840   LEUKOCYTESUR Negative 10/07/2020 1057   LEUKOCYTESUR NEGATIVE 01/18/2019 1840   Sepsis Labs: @LABRCNTIP (procalcitonin:4,lacticidven:4)  ) Recent Results (from the past 240 hour(s))  Resp Panel by RT-PCR (Flu A&B, Covid) Nasopharyngeal Swab     Status: None   Collection Time: 06/23/21 12:38 AM   Specimen: Nasopharyngeal Swab; Nasopharyngeal(NP) swabs in vial transport medium  Result Value Ref Range Status    SARS Coronavirus 2 by RT PCR NEGATIVE NEGATIVE Final    Comment: (NOTE) SARS-CoV-2 target nucleic acids are NOT DETECTED.  The SARS-CoV-2 RNA is generally detectable in upper respiratory specimens during the acute phase of infection. The lowest concentration of SARS-CoV-2 viral copies this assay can detect is 138 copies/mL. A negative result does not preclude SARS-Cov-2 infection and should not be used as the sole basis for treatment or other patient management decisions. A negative result may occur with  improper specimen collection/handling, submission of specimen other than nasopharyngeal swab, presence of viral mutation(s) within the areas targeted by this assay, and inadequate number of viral copies(<138 copies/mL). A negative result must be combined with clinical observations, patient history, and epidemiological information. The expected result is Negative.  Fact Sheet for Patients:  06/25/21  Fact Sheet for Healthcare Providers:  BloggerCourse.com  This test is no t yet approved or cleared by the [place] FDA and  has been  authorized for detection and/or diagnosis of SARS-CoV-2 by FDA under an Emergency Use Authorization (EUA). This EUA will remain  in effect (meaning this test can be used) for the duration of the COVID-19 declaration under Section 564(b)(1) of the Act, 21 U.S.C.section 360bbb-3(b)(1), unless the authorization is terminated  or revoked sooner.       Influenza A by PCR NEGATIVE NEGATIVE Final   Influenza B by PCR NEGATIVE NEGATIVE Final    Comment: (NOTE) The Xpert Xpress SARS-CoV-2/FLU/RSV plus assay is intended as an aid in the diagnosis of influenza from Nasopharyngeal swab specimens and should not be used as a sole basis for treatment. Nasal washings and aspirates are unacceptable for Xpert Xpress SARS-CoV-2/FLU/RSV testing.  Fact Sheet for  Patients: BloggerCourse.com  Fact Sheet for Healthcare Providers: SeriousBroker.it  This test is not yet approved or cleared by the [place] FDA and has been authorized for detection and/or diagnosis of SARS-CoV-2 by FDA under an Emergency Use Authorization (EUA). This EUA will remain in effect (meaning this test can be used) for the duration of the COVID-19 declaration under Section 564(b)(1) of the Act, 21 U.S.C. section 360bbb-3(b)(1), unless the authorization is terminated or revoked.  Performed at [facility], [address], [place], [place] [address]   MRSA Next Gen by PCR, Nasal     Status: None   Collection Time: 06/23/21  4:53 AM   Specimen: Nasal Mucosa; Nasal Swab  Result Value Ref Range Status   MRSA by PCR Next Gen NOT DETECTED NOT DETECTED Final    Comment: (NOTE) The GeneXpert MRSA Assay (FDA approved for NASAL specimens only), is one component of a comprehensive MRSA colonization surveillance program. It is not intended to diagnose MRSA infection nor to guide or monitor treatment for MRSA infections. Test performance is not FDA approved in patients less than 47 years old. Performed at [facility], [address] [place], [place] [address]       Studies: CT Angio Chest PE W and/or Wo Contrast  Result Date: 06/23/2021 CLINICAL DATA:  Concern for pulmonary embolism. EXAM: CT ANGIOGRAPHY CHEST WITH CONTRAST TECHNIQUE: Multidetector CT imaging of the chest was performed using the standard protocol during bolus administration of intravenous contrast. Multiplanar CT image reconstructions and MIPs were obtained to evaluate the vascular anatomy. CONTRAST:  56mL OMNIPAQUE IOHEXOL 350 MG/ML SOLN COMPARISON:  Chest CT dated 06/07/2021 and radiograph dated 06/23/2021. FINDINGS: Cardiovascular: No cardiomegaly or pericardial effusion. Mild dilatation of the right heart chambers. The  right ventricular lumen measures 5.3 cm and the left ventricular lumen measures 3.2 cm for a ratio of 1.7 consistent with right heart straining. The thoracic aorta is unremarkable. Evaluation of the pulmonary arteries is very limited due to severe respiratory motion artifact. There are however bilateral lobar pulmonary artery emboli extending from the central branches into the upper and lower lobe lobar branches. Mediastinum/Nodes: No hilar or mediastinal adenopathy. The esophagus is grossly unremarkable. No mediastinal fluid collection. Lungs/Pleura: Patchy areas of consolidative change involving the left lower lobe, lingula, left upper lobe and right perihilar region may represent multilobar. Pulmonary infarcts are less likely but not excluded. No pleural effusion or pneumothorax. The central airways are patent. Upper Abdomen: Fatty liver. Musculoskeletal: No chest wall abnormality. No acute or significant osseous findings. Review of the MIP images confirms the above findings. IMPRESSION: 1. Bilateral lobar pulmonary artery emboli with CT evidence of right heart straining (RV/LV Ratio = 1.7) consistent with at least submassive (intermediate risk) PE.  The presence of right heart strain has been associated with an increased risk of morbidity and mortality. 2. Bilateral pulmonary opacities may represent multilobar pneumonia. Pulmonary infarcts are less likely but not excluded. 3. Fatty liver. These results were called by telephone at the time of interpretation on 06/23/2021 at 2:23 am to provider [facility] , who verbally acknowledged these results. Electronically Signed   By: Elgie Collard M.D.   On: 06/23/2021 02:27   DG Chest Port 1 View  Result Date: 06/23/2021 CLINICAL DATA:  Shortness of breath EXAM: PORTABLE CHEST 1 VIEW COMPARISON:  06/09/2021 FINDINGS: Patchy left lower lobe airspace disease. Right lung clear. Heart is normal size. No effusions or acute bony abnormality. IMPRESSION: Left lower lobe  airspace disease compatible with pneumonia. Electronically Signed   By: Charlett Nose M.D.   On: 06/23/2021 00:42   [place] EKG SITE RITE  Result Date: 06/23/2021 If Site Rite image not attached, placement could not be confirmed due to current cardiac rhythm.   Scheduled Meds:  amLODipine  10 mg Oral Daily   atorvastatin  40 mg Oral Daily   [START ON 06/24/2021] Chlorhexidine Gluconate Cloth  6 each Topical Q0600   insulin aspart  0-15 Units Subcutaneous TID WC   insulin aspart  0-5 Units Subcutaneous QHS   insulin detemir  10 Units Subcutaneous QHS   mouth rinse  15 mL Mouth Rinse BID    Continuous Infusions:  heparin 1,900 Units/hr (06/23/21 0342)     LOS: 0 days     Hollice Espy, MD [facility]   06/23/2021, 9:01 AM

## 2021-06-23 NOTE — Progress Notes (Signed)
ANTICOAGULATION CONSULT NOTE - Initial Consult  Pharmacy Consult for heparin Indication: Bilateral pulmonary emboli with right heart strain  Allergies  Allergen Reactions   Lisinopril-Hydrochlorothiazide Other (See Comments)    "Interfered with the pH level in the serum"    Patient Measurements: Height: 5\' 9"  (175.3 cm) Weight: (!) 172.4 kg (380 lb) IBW/kg (Calculated) : 70.7 Heparin Dosing Weight: 113.6kg  Vital Signs: Temp: 97.4 F (36.3 C) (10/24 0020) Temp Source: Axillary (10/24 0020) BP: 163/100 (10/24 0250) Pulse Rate: 107 (10/24 0250)  Labs: Recent Labs    06/23/21 0029  HGB 14.1  HCT 43.8  PLT 293  CREATININE 1.37*  TROPONINIHS 68*    Estimated Creatinine Clearance: 117.5 mL/min (A) (by C-G formula based on SCr of 1.37 mg/dL (H)).   Medical History: Past Medical History:  Diagnosis Date   Atypical chest pain 05/27/2016   a. 05/2016: NST showing small area of moderare anteroapical ischemia b. 05/2016: cath showing tortous but normal cors which confirmed a false positive NST.   Diabetes mellitus without complication (HCC)    GERD (gastroesophageal reflux disease) 10/02/2016   Hypertension     Assessment: 35 yo male who was treated for pna 2 weeks ago presents with SOB and chest pain.  Pharmacy consulted to dose heparin drip for Bilateral pulmonary emboli with right heart strain.  No prior AC noted  CBC WNL, baseline labs ordered  Goal of Therapy:  Heparin level 0.3-0.7 units/ml Monitor platelets by anticoagulation protocol: Yes   Plan:  Heparin bolus 5000 units x 1 Start heparin drip a  1900 units/hr Heparin level in 6 hours Daily CBC  31 RPh 06/23/2021, 3:11 AM

## 2021-06-23 NOTE — Progress Notes (Signed)
Peripherally Inserted Central Catheter Placement  The IV Nurse has discussed with the patient and/or persons authorized to consent for the patient, the purpose of this procedure and the potential benefits and risks involved with this procedure.  The benefits include less needle sticks, lab draws from the catheter, and the patient may be discharged home with the catheter. Risks include, but not limited to, infection, bleeding, blood clot (thrombus formation), and puncture of an artery; nerve damage and irregular heartbeat and possibility to perform a PICC exchange if needed/ordered by physician.  Alternatives to this procedure were also discussed.  Bard Power PICC patient education guide, fact sheet on infection prevention and patient information card has been provided to patient /or left at bedside.    PICC Placement Documentation  PICC Triple Lumen 06/23/21 PICC Right Basilic 47 cm 1 cm (Active)  Indication for Insertion or Continuance of Line Limited venous access - need for IV therapy >5 days (PICC only) 06/23/21 1321  Exposed Catheter (cm) 1 cm 06/23/21 1321  Site Assessment Clean;Dry;Intact 06/23/21 1321  Lumen #1 Status Flushed;Blood return noted;Saline locked 06/23/21 1321  Lumen #2 Status Flushed;Blood return noted;Saline locked 06/23/21 1321  Lumen #3 Status Flushed;Blood return noted;Saline locked 06/23/21 1321  Dressing Type Transparent 06/23/21 1321  Dressing Status Clean;Dry;Intact 06/23/21 1321  Antimicrobial disc in place? Yes 06/23/21 1321  Dressing Change Due 06/30/21 06/23/21 1321       Shayleen Eppinger, [place] Ramos 06/23/2021, 1:26 PM

## 2021-06-23 NOTE — H&P (Signed)
History and Physical    PLEASE NOTE THAT DRAGON DICTATION SOFTWARE WAS USED IN THE CONSTRUCTION OF THIS NOTE.   [facility] XNA:[id number] DOB: 1986/03/27 DOA: 06/23/2021  PCP: Charlott Rakes, MD Patient coming from: home   I have personally briefly reviewed patient's old medical records in [facility]  Chief Complaint: Shortness of breath  HPI: [facility] is a 35 y.o. male with medical history significant for type 2 diabetes mellitus, essential hypertension, who is admitted to [facility] on 06/23/2021 with acute bilateral pulmonary emboli after presenting from home to [facility] complaining of shortness of breath.   2 weeks ago, patient was hospitalized in the urinalysis time for bacterial pneumonia after presenting with shortness of breath new onset cough, with CTA chest on 06/17/2021 showing no evidence of acute pulmonary emboli, but demonstrating multifocal bilateral airspace opacities consistent with pneumonia.  Patient completed ensuing antibiotic course, has subsequently discharged to home.  Following completion of antibiotic course, patient reports that his previous shortness of breath/cough completely resolved and he was asymptomatic from a respiratory standpoint for several days until he developed new shortness of breath over the last 1 to 2 days.  He notes that this new shortness of breath associated with bilateral chest pain that was limited to periods of deep inspiration as well as new nonproductive cough.  Not associate with any hemoptysis.  Denies any associated palpitations, diaphoresis, nausea, vomiting, dizziness, presyncope, or syncope.  The patient refers that he is not currently on any blood thinners as an outpatient, including aspirin.  Denies any associated orthopnea, PND, or worsening of peripheral edema.  Denies any recent calf tenderness, new lower extremity erythema.  Denies any associated subjective fever, chills, rigors, or generalized myalgias.  No  wheezing.  Of note, he recent previous hospitalization was for bacterial pneumonia, noting that the patient tested negative for COVID-19/influenza on the day of the previous admission.  He denies any personal history of prior DVT/PE, but conveys a family history in which he reports that his father died from acute pulmonary embolism.  He notes recent period of recent decline in ambulatory status associated with his recent preceding diagnosis of pneumonia.  Denies any recent trauma or travel.  No recent surgical procedures.    He confirms no baseline supplemental oxygen requirements, and denies any chronic underlying pulmonary conditions.  Reports that he is a lifelong non-smoker.  Per chart review, most recent echocardiogram occurred on 06/08/2021 and demonstrated LVEF 70 to 75%, no evidence of focal wall motion antibodies, while showing mild LVH and indeterminate diastolic parameters, while showing no evidence of significant valvular pathology.  Not on any scheduled diuretic medications at home.  Denies any recent abdominal pain, diarrhea, melena, or hematochezia.  No recent dysuria, gross hematuria, or change in urinary urgency/frequency.     ED Course:  Vital signs in the ED were notable for the following: Tetramex 97.4; heart rate 96-1 18; respiratory rate 17-27; blood pressure 154/99 -169/98; initial oxygen saturation noted to be 80% on room air.  Due to initial concern for heart failure, the patient was started on BiPAP, with ensuing oxygen saturations noted to be 92 to 93%.   Labs were notable for the following: CMP notable for the following: Sodium 133, which corrects to 137 when taking into account concomitant hyperglycemia, bicarbonate 21, anion gap 8, creatinine 1.37 relative to most recent prior serum creatinine 1-1.55 on 06/10/2019, glucose 360, liver enzymes were found to be within normal limits.  BNP  70, high-sensitivity troponin I initially noted to be 68, with repeat value trending up  slightly to 115.  Serum magnesium level 1.6.  CBC notable for white blood cell count 13,800 relative to most recent prior will is about a 6800 on 06/09/2021, hemoglobin 14, platelet count 293.  INR 1.1.  D-dimer 2.43.  COVID-19/influenza PCR were checked in the ED today and found to be negative.  Imaging and additional notable ED work-up: EKG, in comparison to most recent prior from 06/07/2021 showed sinus tachycardia with heart rate 103, normal intervals, T wave inversion in leads III and aVF, which is unchanged from most recent prior EKG, no evidence of ST changes, including no evidence of ST elevation.  CTA chest, which was compared to CTA chest from 06/07/2021, showed interval development of bilateral pulmonary emboli with CT evidence of right heart strain.  In addition, bilateral airspace opacities are once again seen, in the absence of any evidence of pleural effusion, pneumothorax, or overt edema.   While in the ED, the following were administered: Heparin bolus administered followed by initiation of heparin drip.      Review of Systems: As per HPI otherwise 10 point review of systems negative.   Past Medical History:  Diagnosis Date   Atypical chest pain 05/27/2016   a. 05/2016: NST showing small area of moderare anteroapical ischemia b. 05/2016: cath showing tortous but normal cors which confirmed a false positive NST.   Diabetes mellitus without complication (HCC)    GERD (gastroesophageal reflux disease) 10/02/2016   Hypertension     Past Surgical History:  Procedure Laterality Date   CARDIAC CATHETERIZATION N/A 06/10/2016   Procedure: Left Heart Cath and Coronary Angiography;  Surgeon: William Hampshire, MD;  Location: [place] [facility];  Service: Cardiovascular;  Laterality: N/A;   COLONOSCOPY N/A 02/04/2016   Procedure: COLONOSCOPY;  Surgeon: William Shipper, MD;  Location: [facility];  Service: Endoscopy;  Laterality: N/A;   NO PAST SURGERIES      Social History:  reports that  he has never smoked. He has never used smokeless tobacco. He reports that he does not drink alcohol and does not use drugs.   Allergies  Allergen Reactions   Lisinopril-Hydrochlorothiazide Other (See Comments)    "Interfered with the pH level in the serum"    Family History  Problem Relation Age of Onset   Diabetes Mother    Diabetes Father    Cancer Maternal Grandmother    Breast cancer Cousin    Alzheimer's disease Paternal Grandmother     Family history reviewed and not pertinent    Prior to Admission medications   Medication Sig Start Date End Date Taking? Authorizing Provider  amLODipine (NORVASC) 10 MG tablet Take 1 tablet (10 mg total) by mouth daily. 04/07/21 07/06/21 Yes Charlott Rakes, MD  aspirin 325 MG EC tablet Take 325 mg by mouth daily as needed for pain (or headaches).   Yes [provider]  atorvastatin (LIPITOR) 40 MG tablet Take 1 tablet (40 mg total) by mouth daily. 04/10/21  Yes Charlott Rakes, MD  carvedilol (COREG) 25 MG tablet Take 1 tablet (25 mg total) by mouth 2 (two) times daily. 04/10/21  Yes Charlott Rakes, MD  hydrochlorothiazide (MICROZIDE) 12.5 MG capsule Take 1 capsule (12.5 mg total) by mouth daily. 06/09/21 09/07/21 Yes Dahal, Marlowe Aschoff, MD  insulin detemir (LEVEMIR FLEXTOUCH) 100 UNIT/ML FlexPen Inject 15 Units into the skin at bedtime. 06/09/21  Yes Dahal, Marlowe Aschoff, MD  metFORMIN (GLUCOPHAGE) 500 MG  tablet Take 2 tablets (1,000 mg total) by mouth 2 (two) times daily with a meal. 04/10/21  Yes Newlin, Enobong, MD  Blood Glucose Monitoring Suppl (ONETOUCH VERIO REFLECT) w/Device KIT Check blood sugar TID E11.69 10/07/20   Charlott Rakes, MD  exenatide (BYETTA 5 MCG PEN) 5 MCG/0.02ML SOPN injection Inject 5 mcg into the skin 2 (two) times daily with a meal. Patient not taking: No sig reported 04/11/21   Charlott Rakes, MD  Insulin Pen Needle (BD PEN NEEDLE NANO U/F) 32G X 4 MM MISC 10 Units by Does not apply route daily. 10/07/20   Charlott Rakes, MD   Ipratropium-Albuterol (COMBIVENT) 20-100 MCG/ACT AERS respimat Inhale 1 puff into the lungs 4 (four) times daily. Patient not taking: No sig reported 07/02/19   Allie Bossier, MD  Lancets [facility] DELICA PLUS [id number]) MISC CHECK BLOOD SUGAR 3 TIMES A DAY 01/13/21   Charlott Rakes, MD  terbinafine (LAMISIL AT) 1 % cream Apply 1 application topically 2 (two) times daily. Patient not taking: No sig reported 05/30/20   Charlott Rakes, MD  lisinopril-hydrochlorothiazide (ZESTORETIC) 20-12.5 MG tablet Take 2 tablets by mouth daily. 07/22/20 08/27/20  Charlott Rakes, MD     Objective    Physical Exam: Vitals:   06/23/21 0216 06/23/21 0230 06/23/21 0235 06/23/21 0250  BP: (!) 160/102 (!) 164/101 (!) 169/98 (!) 163/100  Pulse: (!) 118  (!) 107 (!) 107  Resp: (!) 25 (!) 28 (!) 31 (!) 27  Temp:      TempSrc:      SpO2: (!) 88%  91% 91%  Weight:      Height:        General: appears to be stated age; alert, oriented; mildly increased work of breathing noted Skin: warm, dry, no rash Head:  AT/[place] Mouth:  Oral mucosa membranes appear moist, normal dentition Neck: supple; trachea midline Heart:  RRR; did not appreciate any M/R/G Lungs: Slightly diminished bibasilar breath sounds, but otherwise CTAB, did not appreciate any wheezes, rales, or rhonchi Abdomen: + BS; soft, ND, NT Vascular: 2+ pedal pulses b/l; 2+ radial pulses b/l Extremities: no peripheral edema, no muscle wasting Neuro: strength and sensation intact in upper and lower extremities b/l     Labs on Admission: I have personally reviewed following labs and imaging studies  CBC: Recent Labs  Lab 06/23/21 0029  WBC 13.8*  NEUTROABS 11.1*  HGB 14.1  HCT 43.8  MCV 81.9  PLT 944   Basic Metabolic Panel: Recent Labs  Lab 06/23/21 0029  NA 133*  K 4.9  CL 104  CO2 21*  GLUCOSE 360*  BUN 20  CREATININE 1.37*  CALCIUM 9.4   GFR: Estimated Creatinine Clearance: 117.5 mL/min (A) (by C-G formula based on SCr of  1.37 mg/dL (H)). Liver Function Tests: Recent Labs  Lab 06/23/21 0029  AST 17  ALT 27  ALKPHOS 95  BILITOT 1.0  PROT 8.1  ALBUMIN 3.9   No results for input(s): LIPASE, AMYLASE in the last 168 hours. No results for input(s): AMMONIA in the last 168 hours. Coagulation Profile: No results for input(s): INR, PROTIME in the last 168 hours. Cardiac Enzymes: No results for input(s): CKTOTAL, CKMB, CKMBINDEX, TROPONINI in the last 168 hours. BNP (last 3 results) No results for input(s): PROBNP in the last 8760 hours. HbA1C: No results for input(s): HGBA1C in the last 72 hours. CBG: No results for input(s): GLUCAP in the last 168 hours. Lipid Profile: No results for input(s): CHOL, HDL, LDLCALC, TRIG,  CHOLHDL, LDLDIRECT in the last 72 hours. Thyroid Function Tests: No results for input(s): TSH, T4TOTAL, FREET4, T3FREE, THYROIDAB in the last 72 hours. Anemia Panel: No results for input(s): VITAMINB12, FOLATE, FERRITIN, TIBC, IRON, RETICCTPCT in the last 72 hours. Urine analysis:    Component Value Date/Time   COLORURINE STRAW (A) 01/18/2019 1840   APPEARANCEUR CLEAR 01/18/2019 1840   LABSPEC 1.021 01/18/2019 1840   PHURINE 5.0 01/18/2019 1840   GLUCOSEU >=500 (A) 01/18/2019 1840   HGBUR NEGATIVE 01/18/2019 1840   BILIRUBINUR negative 10/07/2020 1057   KETONESUR negative 10/07/2020 [place] 01/18/2019 1840   PROTEINUR NEGATIVE 01/18/2019 1840   UROBILINOGEN 0.2 10/07/2020 1057   NITRITE Negative 10/07/2020 1057   NITRITE NEGATIVE 01/18/2019 1840   LEUKOCYTESUR Negative 10/07/2020 1057   LEUKOCYTESUR NEGATIVE 01/18/2019 1840    Radiological Exams on Admission: CT Angio Chest PE W and/or Wo Contrast  Result Date: 06/23/2021 CLINICAL DATA:  Concern for pulmonary embolism. EXAM: CT ANGIOGRAPHY CHEST WITH CONTRAST TECHNIQUE: Multidetector CT imaging of the chest was performed using the standard protocol during bolus administration of intravenous contrast.  Multiplanar CT image reconstructions and MIPs were obtained to evaluate the vascular anatomy. CONTRAST:  46mL OMNIPAQUE IOHEXOL 350 MG/ML SOLN COMPARISON:  Chest CT dated 06/07/2021 and radiograph dated 06/23/2021. FINDINGS: Cardiovascular: No cardiomegaly or pericardial effusion. Mild dilatation of the right heart chambers. The right ventricular lumen measures 5.3 cm and the left ventricular lumen measures 3.2 cm for a ratio of 1.7 consistent with right heart straining. The thoracic aorta is unremarkable. Evaluation of the pulmonary arteries is very limited due to severe respiratory motion artifact. There are however bilateral lobar pulmonary artery emboli extending from the central branches into the upper and lower lobe lobar branches. Mediastinum/Nodes: No hilar or mediastinal adenopathy. The esophagus is grossly unremarkable. No mediastinal fluid collection. Lungs/Pleura: Patchy areas of consolidative change involving the left lower lobe, lingula, left upper lobe and right perihilar region may represent multilobar. Pulmonary infarcts are less likely but not excluded. No pleural effusion or pneumothorax. The central airways are patent. Upper Abdomen: Fatty liver. Musculoskeletal: No chest wall abnormality. No acute or significant osseous findings. Review of the MIP images confirms the above findings. IMPRESSION: 1. Bilateral lobar pulmonary artery emboli with CT evidence of right heart straining (RV/LV Ratio = 1.7) consistent with at least submassive (intermediate risk) PE. The presence of right heart strain has been associated with an increased risk of morbidity and mortality. 2. Bilateral pulmonary opacities may represent multilobar pneumonia. Pulmonary infarcts are less likely but not excluded. 3. Fatty liver. These results were called by telephone at the time of interpretation on 06/23/2021 at 2:23 am to provider [facility] , who verbally acknowledged these results. Electronically Signed   By: Anner Crete M.D.   On: 06/23/2021 02:27   DG Chest Port 1 View  Result Date: 06/23/2021 CLINICAL DATA:  Shortness of breath EXAM: PORTABLE CHEST 1 VIEW COMPARISON:  06/09/2021 FINDINGS: Patchy left lower lobe airspace disease. Right lung clear. Heart is normal size. No effusions or acute bony abnormality. IMPRESSION: Left lower lobe airspace disease compatible with pneumonia. Electronically Signed   By: Rolm Baptise M.D.   On: 06/23/2021 00:42     EKG: Independently reviewed, with result as described above.    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  Presentation is associated with acute hypoxic respiratory failure, as further detailed below.No evidence of associated hypotension to warrant consideration for TPA administration. Appears to be patient's first PE/DVT.  In terms of potential provoking factors, patient has recently undergone a period of diminished ambulatory activity in the setting of recent hospitalization for bacterial pneumonia, as further detailed above.  His hypercoagulable risk factors also include morbid obesity. Overall, will require at least 3-6 months of anticoagulation. Not on any blood-thinners at home, including no aspirin.  Started on heparin drip in the emergency department this evening following heparin bolus.  Of note, mildly elevated troponin appears consistent with finding of acute PE, and ACS is felt to be less likely at this time.  Chest pain has been exclusively pleuritic in nature and EKG demonstrates no evidence of acute ischemic changes.       Plan: Continue Heparin drip .  Ultimately, anticipate conversion to an oral anticoagulant to complete a 3 to 71-monthcourse of anticoagulation.  Monitor on telemetry. Monitor for development of hypotension. Prn supplemental O2 in order to maintain oxygen saturations greater than or equal to 92%. Prn Norco, of which the acetaminophen component may be beneficial for its anti-inflammatory effects. Incentive spirometry.  Continuous pulse ox.  Recheck CBC in the morning.     #) Acute hypoxic respiratory failure: In setting of patient's report of no known baseline supplemental oxygen requirements, initial oxygen saturation was found to be in the low 80s after presenting with 2 days of progressive shortness of breath.  Initially started on BiPAP due to preliminary concerns for underlying heart failure prior to identification of aforementioned acute bilateral pulmonary emboli.  Oxygen saturations improved on BiPAP, noted to be 92 to 93% on room air.  In the absence of any overt evidence of underlying heart failure at this time, both clinically and radiographically, and with nonelevated BNP, will attempt to transition off of BiPAP to nonrebreather versus high flow nasal cannula.  Of note, patient was recently hospitalized treatment of bacterial pneumonia after CTA chest on 06/17/2021 showed multifocal bilateral airspace opacities concerning for pneumonia, and given that he reported ensuing improvement/resolution of his shortness of breath following antibiotic treatment, I suspect that the bilateral airspace opacities seen on today's CTA chest are radiographic residual finding as opposed to representing new or worsening or suboptimally treated bacterial pneumonia.  Will check procalcitonin level and compared that from most recent previous hospitalization to further evaluate.  COVID-19/influenza PCR  checked today were found to be negative.  While the patient has a mildly elevated troponin, suspect that this is on the basis of type II supply demand mismatch as a consequence of presenting acute pulmonary embolism and associated acute hypoxic respiratory failure as opposed to representing a type I process due to acute plaque rupture.  Present chest pain is limited to pleuritic features, in the absence of any description of chest pain that would be typical for ACS, and presenting EKG shows no evidence of acute cardiopulmonary process overall, ACS is felt to be less likely but we will continue to monitor closely on telemetry and further trend serial troponin.  Of  note, present chest x-ray and CTA chest showed no evidence of pneumothorax or pleural effusion.  Presenting acute hypoxic respiratory failure in setting of acute bilateral pulmonary emboli is complicated by body habitus, and raises possibility of further complication from an underlying pickwickian picture.   Plan: Further evaluation and management of presenting acute bilateral pulmonary [place], including continuation of heparin drip.  Monitor to as well as oximetry.  Monitor on telemetry.  Add on procalcitonin.  Repeat CBC in the morning.  Magnesium supplementation, as further detailed below.  Check serum phosphorus level.  Repeat blood gas in the morning.  Respiratory therapy consulted for assistance with transitioning from BiPAP to nonrebreather versus high flow nasal cannula given the absence of heart failure.  Continue to trend serial troponin.       #) Hypomagnesemia: Serum magnesium level found to be 1.6.  Plan: Magnesium sulfate 2 g IV over 2 hours x 1 dose now.      #) Leukocytosis: Mildly elevated white blood cell count of 13,900, rule to most recent prior of 1600 on 06/10/2019.  Suspect that this elevation is inflammatory in the setting of acute bilateral pulmonary emboli, as above, as opposed to representing acute underlying  infection, with no new infiltrates identified on CTA chest relative to this seen on most recent prior CTA chest following interval antibiotic therapy.  #90/influenza PCR negative.  Denies any acute urinary symptoms.  In the absence of suspected underlying infectious process, criteria for sepsis not currently met.  Plan: Add on procalcitonin, as above.  Repeat CBC with differential in the morning.      #) type 2 diabetes mellitus: Documented history of such, on metformin as well as Levemir 15 units subcu nightly in the absence of any short acting insulin as an outpatient.  Presenting blood sugar 360 without evidence of anion gap metabolic acidosis.  We will resume approximately two thirds of his home basal insulin dose initially, with additional Accu-Chek monitoring, as defined below.  Plan: Levemir 10 units subcu nightly, with next dose now.  Accu-Cheks before every meal and at bedtime with moderate dose Eisel insulin.  Hold metformin during this hospitalization.      #) Essential hypertension: Documented history of such, with outpatient hypertensive regimen including Norvasc, Coreg, HCTZ.  Will refrain from aggressive blood pressure management in the context of presenting acute bilateral pulmonary emboli given increased risk for hypotension.  Additionally, will hold home Coreg for now in the setting of presenting shortness of breath due to potential associated pulmonary inhibition as a consequence of associated beta-2 antagonism.  Also, will hold home HCTZ for now in setting of acute bilateral pulmonary embolism with evidence of right heart strain given the potential for preload dependence associate with this pathology.  Plan: Hold home Coreg and HCTZ for now.  Will resume home Norvasc, but closely monitor ensuing blood pressure via routine vital signs.     DVT prophylaxis: Heparin drip Code Status: Full code Family Communication: none Disposition Plan: Per Rounding Team Consults called:  none;  Admission status: Inpatient;      Of note, this patient was added by me to the following Admit List/Treatment Team: [email or web address].    Of note, the Adult Admission Order Set (Multimorbid order set) was used by me in the admission process for this patient.   PLEASE NOTE THAT DRAGON DICTATION SOFTWARE WAS USED IN THE CONSTRUCTION OF THIS NOTE.   Rhetta Mura DO [facility] Pager [phone] From [place]   06/23/2021,  3:09 AM

## 2021-06-23 NOTE — Progress Notes (Signed)
eLink Physician-Brief Progress Note Patient Name: [facility] DOB: April 04, 1986 MRN: [id number]   Date of Service  06/23/2021  HPI/Events of Note  BiPAP, NPO.  Has hydralazine oral 50 mg q8 hrs.    eICU Interventions  Hydralazine IV prn, discussed.   Get stat aPTT, was under 35 since been on heparin gtt.      Intervention Category Intermediate Interventions: Hypertension - evaluation and management  Ranee Gosselin 06/23/2021, 9:47 PM

## 2021-06-23 NOTE — Progress Notes (Signed)
Pt transported to and from CT on BIPAP.  Pt tolerated fairly well.

## 2021-06-23 NOTE — ED Provider Notes (Signed)
[facility] DEPT Provider Note: Georgena Spurling, MD, FACEP  CSN: [id number] MRN: [id number] ARRIVAL: 06/23/21 at [place]: [place]  06/23/21 12:40 AM William Moss is a 35 y.o. male who was treated for pneumonia about 2 weeks ago.  He has completed his antibiotics.  He is here with shortness of breath that began yesterday morning.  It worsens throughout the day.  Yesterday evening it became moderate to severe, worse with ambulation.  It was associated with chest pain.  On arrival his chest pain was a 6 out of 10 but it has improved after being placed on BiPAP by respiratory therapy soon after arrival (his oxygen saturation on room air on arrival was noted to be 60%; EMS reports oxygen saturation in the 80s on room air prior to her arrival).  He has had a cough with this as well as a chest tightness and a sensation that he is having difficulty pulling air in.   Past Medical History:  Diagnosis Date   Atypical chest pain 05/27/2016   a. 05/2016: NST showing small area of moderare anteroapical ischemia b. 05/2016: cath showing tortous but normal cors which confirmed a false positive NST.   Diabetes mellitus without complication (HCC)    GERD (gastroesophageal reflux disease) 10/02/2016   Hypertension     Past Surgical History:  Procedure Laterality Date   CARDIAC CATHETERIZATION N/A 06/10/2016   Procedure: Left Heart Cath and Coronary Angiography;  Surgeon: Wellington Hampshire, MD;  Location: [facility];  Service: Cardiovascular;  Laterality: N/A;   COLONOSCOPY N/A 02/04/2016   Procedure: COLONOSCOPY;  Surgeon: Irene Shipper, MD;  Location: [facility];  Service: Endoscopy;  Laterality: N/A;   NO PAST SURGERIES      Family History  Problem Relation Age of Onset   Diabetes Mother    Diabetes Father    Cancer Maternal Grandmother    Breast cancer Cousin    Alzheimer's disease Paternal Grandmother      Social History   Tobacco Use   Smoking status: Never   Smokeless tobacco: Never  Substance Use Topics   Alcohol use: No   Drug use: No    Prior to Admission medications   Medication Sig Start Date End Date Taking? Authorizing Provider  amLODipine (NORVASC) 10 MG tablet Take 1 tablet (10 mg total) by mouth daily. 04/07/21 07/06/21 Yes Charlott Rakes, MD  aspirin 325 MG EC tablet Take 325 mg by mouth daily as needed for pain (or headaches).   Yes [provider]  atorvastatin (LIPITOR) 40 MG tablet Take 1 tablet (40 mg total) by mouth daily. 04/10/21  Yes Charlott Rakes, MD  carvedilol (COREG) 25 MG tablet Take 1 tablet (25 mg total) by mouth 2 (two) times daily. 04/10/21  Yes Charlott Rakes, MD  hydrochlorothiazide (MICROZIDE) 12.5 MG capsule Take 1 capsule (12.5 mg total) by mouth daily. 06/09/21 09/07/21 Yes Dahal, Marlowe Aschoff, MD  insulin detemir (LEVEMIR FLEXTOUCH) 100 UNIT/ML FlexPen Inject 15 Units into the skin at bedtime. 06/09/21  Yes Dahal, Marlowe Aschoff, MD  metFORMIN (GLUCOPHAGE) 500 MG tablet Take 2 tablets (1,000 mg total) by mouth 2 (two) times daily with a meal. 04/10/21  Yes Newlin, Enobong, MD  Blood Glucose Monitoring Suppl (ONETOUCH VERIO REFLECT) w/Device KIT Check blood sugar TID E11.69 10/07/20   Charlott Rakes, MD  exenatide (BYETTA 5 MCG PEN) 5 MCG/0.02ML SOPN injection Inject 5 mcg into the skin  2 (two) times daily with a meal. Patient not taking: No sig reported 04/11/21   Charlott Rakes, MD  Insulin Pen Needle (BD PEN NEEDLE NANO U/F) 32G X 4 MM MISC 10 Units by Does not apply route daily. 10/07/20   Charlott Rakes, MD  Ipratropium-Albuterol (COMBIVENT) 20-100 MCG/ACT AERS respimat Inhale 1 puff into the lungs 4 (four) times daily. Patient not taking: No sig reported 07/02/19   Allie Bossier, MD  Lancets [facility] DELICA PLUS [id number]) MISC CHECK BLOOD SUGAR 3 TIMES A DAY 01/13/21   Charlott Rakes, MD  terbinafine (LAMISIL AT) 1 % cream Apply 1 application topically  2 (two) times daily. Patient not taking: No sig reported 05/30/20   Charlott Rakes, MD  lisinopril-hydrochlorothiazide (ZESTORETIC) 20-12.5 MG tablet Take 2 tablets by mouth daily. 07/22/20 08/27/20  Charlott Rakes, MD    Allergies Lisinopril-hydrochlorothiazide   REVIEW OF SYSTEMS  Negative except as noted here or in the History of Present Illness.   PHYSICAL EXAMINATION  Initial Vital Signs Blood pressure (!) 160/96, pulse (!) 103, temperature (!) 97.4 F (36.3 C), temperature source Axillary, resp. rate (!) 22, height _0  (1.753 m), weight (!) 172.4 kg, SpO2 90 %.  Examination General: Well-developed, high BMI male in mild distress; appearance consistent with age of record HENT: normocephalic; atraumatic Eyes: pupils equal, round and reactive to light; extraocular muscles intact Neck: supple Heart: regular rate and rhythm Lungs: Tachypnea; rales in bases Abdomen: soft; nondistended; nontender; bowel sounds present Extremities: No deformity; full range of motion; pitting edema of lower legs, left greater than right Neurologic: Awake, alert and oriented; motor function intact in all extremities and symmetric; no facial droop Skin: Warm and dry Psychiatric: Anxious`   RESULTS  Summary of this visit's results, reviewed and interpreted by myself:   EKG Interpretation  Date/Time:  Monday June 23 2021 00:26:00 EDT Ventricular Rate:  103 PR Interval:  144 QRS Duration: 104 QT Interval:  353 QTC Calculation: 463 R Axis:   1 Text Interpretation: Sinus tachycardia Abnormal R-wave progression, late transition Borderline repol abnrm, inferolateral leads No significant change was found Confirmed by Shanon Rosser [phone]) on 06/23/2021 12:40:34 AM       Laboratory Studies: Results for orders placed or performed during the hospital encounter of 06/23/21 (from the past 24 hour(s))  CBC WITH DIFFERENTIAL     Status: Abnormal   Collection Time: 06/23/21 12:29 AM  Result Value  Ref Range   WBC 13.8 (H) 4.0 - 10.5 K/uL   RBC 5.35 4.22 - 5.81 MIL/uL   Hemoglobin 14.1 13.0 - 17.0 g/dL   HCT 43.8 39.0 - 52.0 %   MCV 81.9 80.0 - 100.0 fL   MCH 26.4 26.0 - 34.0 pg   MCHC 32.2 30.0 - 36.0 g/dL   RDW 14.7 11.5 - 15.5 %   Platelets 293 150 - 400 K/uL   nRBC 0.0 0.0 - 0.2 %   Neutrophils Relative % 81 %   Neutro Abs 11.1 (H) 1.7 - 7.7 K/uL   Lymphocytes Relative 12 %   Lymphs Abs 1.7 0.7 - 4.0 K/uL   Monocytes Relative 6 %   Monocytes Absolute 0.8 0.1 - 1.0 K/uL   Eosinophils Relative 1 %   Eosinophils Absolute 0.1 0.0 - 0.5 K/uL   Basophils Relative 0 %   Basophils Absolute 0.1 0.0 - 0.1 K/uL   Immature Granulocytes 0 %   Abs Immature Granulocytes 0.04 0.00 - 0.07 K/uL  Comprehensive metabolic panel  Status: Abnormal   Collection Time: 06/23/21 12:29 AM  Result Value Ref Range   Sodium 133 (L) 135 - 145 mmol/L   Potassium 4.9 3.5 - 5.1 mmol/L   Chloride 104 98 - 111 mmol/L   CO2 21 (L) 22 - 32 mmol/L   Glucose, Bld 360 (H) 70 - 99 mg/dL   BUN 20 6 - 20 mg/dL   Creatinine, Ser 1.37 (H) 0.61 - 1.24 mg/dL   Calcium 9.4 8.9 - 10.3 mg/dL   Total Protein 8.1 6.5 - 8.1 g/dL   Albumin 3.9 3.5 - 5.0 g/dL   AST 17 15 - 41 U/L   ALT 27 0 - 44 U/L   Alkaline Phosphatase 95 38 - 126 U/L   Total Bilirubin 1.0 0.3 - 1.2 mg/dL   GFR, Estimated >60 >60 mL/min   Anion gap 8 5 - 15  Brain natriuretic peptide     Status: None   Collection Time: 06/23/21 12:29 AM  Result Value Ref Range   B Natriuretic Peptide 69.6 0.0 - 100.0 pg/mL  Blood gas, arterial (at [facility])     Status: Abnormal   Collection Time: 06/23/21 12:29 AM  Result Value Ref Range   FIO2 100.00    O2 Content 15.0 L/min   Delivery systems NON-REBREATHER OXYGEN MASK    pH, Arterial 7.391 7.350 - 7.450   pCO2 arterial 29.6 (L) 32.0 - 48.0 mmHg   pO2, Arterial 44.7 (L) 83.0 - 108.0 mmHg   Bicarbonate 17.6 (L) 20.0 - 28.0 mmol/L   Acid-base deficit 5.7 (H) 0.0 - 2.0 mmol/L   O2 Saturation 75.3 %    Patient temperature 98.6    Collection site RIGHT RADIAL    Allens test (pass/fail) PASS PASS  Troponin I (High Sensitivity)     Status: Abnormal   Collection Time: 06/23/21 12:29 AM  Result Value Ref Range   Troponin I (High Sensitivity) 68 (H) <18 ng/L  D-dimer, quantitative     Status: Abnormal   Collection Time: 06/23/21 12:31 AM  Result Value Ref Range   D-Dimer, Quant 2.43 (H) 0.00 - 0.50 ug/mL-FEU  Resp Panel by RT-PCR (Flu A&B, Covid) Nasopharyngeal Swab     Status: None   Collection Time: 06/23/21 12:38 AM   Specimen: Nasopharyngeal Swab; Nasopharyngeal(NP) swabs in vial transport medium  Result Value Ref Range   SARS Coronavirus 2 by RT PCR NEGATIVE NEGATIVE   Influenza A by PCR NEGATIVE NEGATIVE   Influenza B by PCR NEGATIVE NEGATIVE   Imaging Studies: CT Angio Chest PE W and/or Wo Contrast  Result Date: 06/23/2021 CLINICAL DATA:  Concern for pulmonary embolism. EXAM: CT ANGIOGRAPHY CHEST WITH CONTRAST TECHNIQUE: Multidetector CT imaging of the chest was performed using the standard protocol during bolus administration of intravenous contrast. Multiplanar CT image reconstructions and MIPs were obtained to evaluate the vascular anatomy. CONTRAST:  54m OMNIPAQUE IOHEXOL 350 MG/ML SOLN COMPARISON:  Chest CT dated 06/07/2021 and radiograph dated 06/23/2021. FINDINGS: Cardiovascular: No cardiomegaly or pericardial effusion. Mild dilatation of the right heart chambers. The right ventricular lumen measures 5.3 cm and the left ventricular lumen measures 3.2 cm for a ratio of 1.7 consistent with right heart straining. The thoracic aorta is unremarkable. Evaluation of the pulmonary arteries is very limited due to severe respiratory motion artifact. There are however bilateral lobar pulmonary artery emboli extending from the central branches into the upper and lower lobe lobar branches. Mediastinum/Nodes: No hilar or mediastinal adenopathy. The esophagus is grossly unremarkable. No  mediastinal fluid collection. Lungs/Pleura: Patchy areas of consolidative change involving the left lower lobe, lingula, left upper lobe and right perihilar region may represent multilobar. Pulmonary infarcts are less likely but not excluded. No pleural effusion or pneumothorax. The central airways are patent. Upper Abdomen: Fatty liver. Musculoskeletal: No chest wall abnormality. No acute or significant osseous findings. Review of the MIP images confirms the above findings. IMPRESSION: 1. Bilateral lobar pulmonary artery emboli with CT evidence of right heart straining (RV/LV Ratio = 1.7) consistent with at least submassive (intermediate risk) PE. The presence of right heart strain has been associated with an increased risk of morbidity and mortality. 2. Bilateral pulmonary opacities may represent multilobar pneumonia. Pulmonary infarcts are less likely but not excluded. 3. Fatty liver. These results were called by telephone at the time of interpretation on 06/23/2021 at 2:23 am to provider [facility] , who verbally acknowledged these results. Electronically Signed   By: Anner Crete M.D.   On: 06/23/2021 02:27   DG Chest Port 1 View  Result Date: 06/23/2021 CLINICAL DATA:  Shortness of breath EXAM: PORTABLE CHEST 1 VIEW COMPARISON:  06/09/2021 FINDINGS: Patchy left lower lobe airspace disease. Right lung clear. Heart is normal size. No effusions or acute bony abnormality. IMPRESSION: Left lower lobe airspace disease compatible with pneumonia. Electronically Signed   By: Rolm Baptise M.D.   On: 06/23/2021 00:42    ED COURSE and MDM  Nursing notes, initial and subsequent vitals signs, including pulse oximetry, reviewed and interpreted by myself.  Vitals:   06/23/21 0120 06/23/21 0130 06/23/21 0147 06/23/21 0216  BP: (!) 165/133 (!) 160/94 (!) 162/94 (!) 160/102  Pulse: (!) 104 97 96 (!) 118  Resp: (!) 22 (!) 25 (!) 27 (!) 25  Temp:      TempSrc:      SpO2: (!) 87% (!) 89% 93% (!) 88%  Weight:       Height:       Medications  iohexol (OMNIPAQUE) 350 MG/ML injection 75 mL (75 mLs Intravenous Contrast Given 06/23/21 0155)    12:47 AM Patient more comfortable on BiPAP.  2:39 AM Heparin started for bilateral pulmonary emboli with right heart strain.  BNP not consistent with congestive heart failure.  Elevated troponin likely due to right heart strain.  2:55 AM Dr. Velia Meyer to admit to hospitalist service.  3:20 AM Patient taken off BiPAP.  Oxygen saturation 88% on nonrebreather.  PROCEDURES  Procedures CRITICAL CARE Performed by: Karen Chafe Camani Sesay Total critical care time: 30 minutes Critical care time was exclusive of separately billable procedures and treating other patients. Critical care was necessary to treat or prevent imminent or life-threatening deterioration. Critical care was time spent personally by me on the following activities: development of treatment plan with patient and/or surrogate as well as nursing, discussions with consultants, evaluation of patient's response to treatment, examination of patient, obtaining history from patient or surrogate, ordering and performing treatments and interventions, ordering and review of laboratory studies, ordering and review of radiographic studies, pulse oximetry and re-evaluation of patient's condition.   ED DIAGNOSES     ICD-10-CM   1. Other acute pulmonary embolism with acute cor pulmonale (HCC)  I26.09     2. Shortness of breath  R06.02 DG Chest [facility] 1 View    DG Chest [place] 1 View    3. Hypoxia  R09.02          Shanon Rosser, MD 06/23/21 [phone]

## 2021-06-23 NOTE — ED Triage Notes (Signed)
Pt to ED via [facility] from home with c/o SOB.  Pt recently completed treatment for pneumonia.  No other known hx aside from DM.

## 2021-06-24 ENCOUNTER — Inpatient Hospital Stay (HOSPITAL_COMMUNITY): Payer: Self-pay

## 2021-06-24 DIAGNOSIS — I2699 Other pulmonary embolism without acute cor pulmonale: Secondary | ICD-10-CM

## 2021-06-24 LAB — BASIC METABOLIC PANEL
Anion gap: 11 (ref 5–15)
BUN: 22 mg/dL — ABNORMAL HIGH (ref 6–20)
CO2: 21 mmol/L — ABNORMAL LOW (ref 22–32)
Calcium: 8.6 mg/dL — ABNORMAL LOW (ref 8.9–10.3)
Chloride: 106 mmol/L (ref 98–111)
Creatinine, Ser: 1.53 mg/dL — ABNORMAL HIGH (ref 0.61–1.24)
GFR, Estimated: >60 mL/min (ref >60)
Glucose, Bld: 317 mg/dL — ABNORMAL HIGH (ref 70–99)
Potassium: 3.8 mmol/L (ref 3.5–5.1)
Sodium: 138 mmol/L (ref 135–145)

## 2021-06-24 LAB — GLUCOSE, CAPILLARY
Glucose-Capillary: 256 mg/dL — ABNORMAL HIGH (ref 70–99)
Glucose-Capillary: 297 mg/dL — ABNORMAL HIGH (ref 70–99)
Glucose-Capillary: 241 mg/dL — ABNORMAL HIGH (ref 70–99)
Glucose-Capillary: 208 mg/dL — ABNORMAL HIGH (ref 70–99)

## 2021-06-24 LAB — HEPARIN LEVEL (UNFRACTIONATED)
Heparin Unfractionated: 0.19 IU/mL — ABNORMAL LOW (ref 0.30–0.70)
Heparin Unfractionated: 0.37 IU/mL (ref 0.30–0.70)
Heparin Unfractionated: 0.42 IU/mL (ref 0.30–0.70)

## 2021-06-24 LAB — HEMOGLOBIN A1C
Hgb A1c MFr Bld: 10.8 % — ABNORMAL HIGH (ref 4.8–5.6)
Mean Plasma Glucose: 263.26 mg/dL

## 2021-06-24 LAB — POCT I-STAT 7, (LYTES, BLD GAS, ICA,H+H)
Acid-base deficit: 4 mmol/L — ABNORMAL HIGH (ref 0.0–2.0)
Bicarbonate: 21.7 mmol/L (ref 20.0–28.0)
Calcium, Ion: 1.27 mmol/L (ref 1.15–1.40)
HCT: 39 % (ref 39.0–52.0)
Hemoglobin: 13.3 g/dL (ref 13.0–17.0)
O2 Saturation: 95 %
Patient temperature: 99.2
Potassium: 4 mmol/L (ref 3.5–5.1)
Sodium: 141 mmol/L (ref 135–145)
TCO2: 23 mmol/L (ref 22–32)
pCO2 arterial: 41.2 mmHg (ref 32.0–48.0)
pH, Arterial: 7.331 — ABNORMAL LOW (ref 7.350–7.450)
pO2, Arterial: 82 mmHg — ABNORMAL LOW (ref 83.0–108.0)

## 2021-06-24 LAB — CBC
HCT: 40.9 % (ref 39.0–52.0)
Hemoglobin: 13.1 g/dL (ref 13.0–17.0)
MCH: 26.5 pg (ref 26.0–34.0)
MCHC: 32 g/dL (ref 30.0–36.0)
MCV: 82.8 fL (ref 80.0–100.0)
Platelets: 194 10*3/uL (ref 150–400)
RBC: 4.94 MIL/uL (ref 4.22–5.81)
RDW: 14.8 % (ref 11.5–15.5)
WBC: 14.9 10*3/uL — ABNORMAL HIGH (ref 4.0–10.5)
nRBC: 0 % (ref 0.0–0.2)

## 2021-06-24 LAB — APTT: aPTT: 47 seconds — ABNORMAL HIGH (ref 24–36)

## 2021-06-24 MED ORDER — LABETALOL HCL 5 MG/ML IV SOLN
10.0000 mg | INTRAVENOUS | Status: DC | PRN
  Administered 2021-06-24 – 2021-06-25 (×2): 10 mg via INTRAVENOUS
  Filled 2021-06-24 (×2): qty 4

## 2021-06-24 MED ORDER — WARFARIN SODIUM 5 MG PO TABS
10.0000 mg | ORAL_TABLET | Freq: Once | ORAL | Status: AC
  Administered 2021-06-24: 10 mg via ORAL
  Filled 2021-06-24: qty 2

## 2021-06-24 MED ORDER — FUROSEMIDE 10 MG/ML IJ SOLN
80.0000 mg | Freq: Once | INTRAMUSCULAR | Status: AC
  Administered 2021-06-24: 80 mg via INTRAVENOUS
  Filled 2021-06-24: qty 8

## 2021-06-24 MED ORDER — WARFARIN - PHARMACIST DOSING INPATIENT: Freq: Every day | Status: DC

## 2021-06-24 MED ORDER — INSULIN DETEMIR 100 UNIT/ML ~~LOC~~ SOLN
15.0000 [IU] | Freq: Every day | SUBCUTANEOUS | Status: DC
  Administered 2021-06-24 – 2021-06-25 (×2): 15 [IU] via SUBCUTANEOUS
  Filled 2021-06-24 (×3): qty 0.15

## 2021-06-24 MED ORDER — HYDRALAZINE HCL 20 MG/ML IJ SOLN
10.0000 mg | INTRAMUSCULAR | Status: DC | PRN
  Administered 2021-06-25: 10 mg via INTRAVENOUS
  Filled 2021-06-24 (×2): qty 1

## 2021-06-24 NOTE — Progress Notes (Signed)
Pharmacy Consult Note - IV Heparin  See previous heparin note today for full H&P  Labs: heparin level 0.42  A/P: Heparin level therapeutic (goal was 0.3-0.5 for 1st 24hr s/p alteplase administration thru 4pm today, but now is 0.3-0.7) on current IV heparin rate of 2500 units/hr. No noted bleeding or issues per RN. Continue current IV heparin rate. Will recheck heparin level in AM with CBC  Hessie Knows, PharmD, BCPS Secure Chat if ?s 06/24/2021 5:44 PM

## 2021-06-24 NOTE — Assessment & Plan Note (Signed)
-   Complicates overall prognosis and care - Body mass index is 66.75 kg/m.

## 2021-06-24 NOTE — Progress Notes (Signed)
Bilateral lower extremity venous duplex has been completed. Preliminary results can be found in CV Proc through chart review.  Results were given to the patient's nurse, Enid Derry.  06/24/21 10:46 AM Olen Cordial RVT

## 2021-06-24 NOTE — Assessment & Plan Note (Signed)
-   Continue amlodipine, Coreg, hydralazine -Use labetalol or hydralazine as needed

## 2021-06-24 NOTE — Progress Notes (Signed)
ANTICOAGULATION CONSULT NOTE - Follow Up Consult  Pharmacy Consult for Heparin, Warfarin Indication: pulmonary embolus and DVT  Allergies  Allergen Reactions   Lisinopril-Hydrochlorothiazide Other (See Comments)    "Interfered with the pH level in the serum"    Patient Measurements: Height: 5\' 9"  (175.3 cm) Weight: (!) 205 kg (452 lb) IBW/kg (Calculated) : 70.7 Heparin Dosing Weight: 113.6 kg  Vital Signs: Temp: 99.6 F (37.6 C) (10/25 1100) Temp Source: Axillary (10/25 1100) BP: 165/113 (10/25 1200) Pulse Rate: 93 (10/25 1200)  Labs: Recent Labs    06/23/21 0029 06/23/21 0029 06/23/21 0031 06/23/21 0031 06/23/21 0221 06/23/21 0552 06/23/21 0953 06/23/21 1338 06/23/21 1635 06/23/21 2012 06/23/21 2357 06/24/21 0341 06/24/21 0909  HGB 14.1  --   --   --   --  14.7  --   --  12.2*  --   --  13.3 13.1  HCT 43.8  --   --   --   --  44.5  --   --  37.2*  --   --  39.0 40.9  PLT 293  --   --   --   --  243  --   --  206  --   --   --  194  APTT  --    < > 27  --   --   --   --  34 30  --  47*  --   --   LABPROT  --   --  13.7  --   --   --   --  14.8 15.8*  --   --   --   --   INR  --   --  1.1  --   --   --   --  1.2 1.3*  --   --   --   --   HEPARINUNFRC  --   --   --    < >  --   --  0.21*  --  <0.10*  --  0.19*  --  0.37  CREATININE 1.37*  --   --   --   --  1.42*  --   --   --  1.51*  --   --  1.53*  TROPONINIHS 68*  --   --   --  115*  --  258*  --   --   --   --   --   --    < > = values in this interval not displayed.     Estimated Creatinine Clearance: 117.4 mL/min (A) (by C-G formula based on SCr of 1.53 mg/dL (H)).   Medications:  Infusions:   sodium chloride 10 mL/hr at 06/24/21 1200   heparin 2,500 Units/hr (06/24/21 1200)    Assessment: 34 yoM admitted on 10/24 with SOB and chest pain, found to have bilateral PE with right heart strain.  He was recently admitted for pneumonia (10/8-10/10).  No prior to admission anticoagulation noted.  Pharmacy  consulted to dose IV heparin and warfarin for long-term anticoagulation. D-dimer 2.43 (10/24). Baseline coags:  INR 1.1, PTT 27.    Today, 06/24/2021: S/p TPA for PE - infused 10/24 1400-1603 Heparin level 0.37- improved to therapeutic on IV Heparin infusion at 2500 units/hr  Hgb increased to 13.1, Plts 194K No bleeding or infusion related issues reported by RN  Goal of Therapy:  Heparin level 0.3-0.5 for the first 24 hours after alteplase infusion, then goal of 0.3-0.7 thereafter  INR 2-3 Monitor platelets by anticoagulation protocol: Yes   Plan: Continue heparin infusion to 2500 units/hr Check heparin level in 6 hours Daily heparin level and CBC while on heparin Monitor closely for signs/symptoms of bleeding  Asencion Gowda PharmD/MBA Candidate [employer], Class of 2023    Addendum:  Warfarin per pharmacy Plan: Warfarin 10mg  PO today at 1600 Patient will need a minimum of 5 days warfarin / parenteral overlap and until INR > 2 for 24 hours.   Daily PT/INR. Educate prior to discharge.   PharmD, BCPS Clinical Pharmacist [facility] main pharmacy [phone] 06/24/2021 2:07 PM

## 2021-06-24 NOTE — Assessment & Plan Note (Signed)
-   A1c 10.8% on 06/24/2021.  Has worsened since August 2022 (previously 7.4%) - Continue Semglee; will adjust as needed - Continue SSI and CBG monitoring

## 2021-06-24 NOTE — Assessment & Plan Note (Addendum)
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  Does have a known family history of PE in his dad - outpatient hypercoag workup recommened per pulm - s/p TPA on 10/24 - s/p heparin; Coumadin started 10/25 with heparin bridge - LE duplex: Positive for DVT in Left femoral and popliteal vein - INR remains therapeutic; continue Coumadin per pharmacy and will need dosing rec's and followup at discharge

## 2021-06-24 NOTE — Progress Notes (Signed)
RT obtained ABG on pt with the following results. No changes at this time. RT will continue to monitor.   Results for William Moss, William Moss (MRN [id number]) as of 06/24/2021 03:45  Ref. Range 06/24/2021 03:41  Sample type Unknown ARTERIAL  pH, Arterial Latest Ref Range: 7.350 - 7.450  7.331 (L)  pCO2 arterial Latest Ref Range: 32.0 - 48.0 mmHg 41.2  pO2, Arterial Latest Ref Range: 83.0 - 108.0 mmHg 82 (L)  TCO2 Latest Ref Range: 22 - 32 mmol/L 23  Acid-base deficit Latest Ref Range: 0.0 - 2.0 mmol/L 4.0 (H)  Bicarbonate Latest Ref Range: 20.0 - 28.0 mmol/L 21.7  O2 Saturation Latest Units: % 95.0  Patient temperature Unknown 99.2 F  Collection site Unknown Radial

## 2021-06-24 NOTE — Progress Notes (Signed)
ANTICOAGULATION CONSULT NOTE - Follow Up Consult  Pharmacy Consult for Heparin Indication: pulmonary embolus  Allergies  Allergen Reactions   Lisinopril-Hydrochlorothiazide Other (See Comments)    "Interfered with the pH level in the serum"    Patient Measurements: Height: 5\' 9"  (175.3 cm) Weight: (!) 205 kg (452 lb) IBW/kg (Calculated) : 70.7 Heparin Dosing Weight: 113.6 kg  Vital Signs: BP: 152/113 (10/25 0600) Pulse Rate: 91 (10/25 0600)  Labs: Recent Labs    06/23/21 0029 06/23/21 0029 06/23/21 0031 06/23/21 0221 06/23/21 0552 06/23/21 0953 06/23/21 1338 06/23/21 1635 06/23/21 2012 06/23/21 2357 06/24/21 0341  HGB 14.1  --   --   --  14.7  --   --  12.2*  --   --  13.3  HCT 43.8  --   --   --  44.5  --   --  37.2*  --   --  39.0  PLT 293  --   --   --  243  --   --  206  --   --   --   APTT  --    < > 27  --   --   --  34 30  --  47*  --   LABPROT  --   --  13.7  --   --   --  14.8 15.8*  --   --   --   INR  --   --  1.1  --   --   --  1.2 1.3*  --   --   --   HEPARINUNFRC  --   --   --   --   --  0.21*  --  <0.10*  --  0.19*  --   CREATININE 1.37*  --   --   --  1.42*  --   --   --  1.51*  --   --   TROPONINIHS 68*  --   --  115*  --  258*  --   --   --   --   --    < > = values in this interval not displayed.     Estimated Creatinine Clearance: 119 mL/min (A) (by C-G formula based on SCr of 1.51 mg/dL (H)).   Medications:  Infusions:   sodium chloride 10 mL/hr at 06/24/21 0617   heparin 2,500 Units/hr (06/24/21 0617)    Assessment: 34 yoM admitted on 10/24 with SOB and chest pain, found to have bilateral PE with right heart strain.  He was recently admitted for pneumonia (10/8-10/10).  No prior to admission anticoagulation noted. D-dimer 2.43 (10/24). Baseline coags:  INR 1.1, PTT 27.  Pharmacy consulted to dose IV heparin.  TPA given per CCM. Low dose heparin resumed once post-tpa aptt <80.    Today, 06/24/2021: S/p TPA for PE on 10/24- infused  from 1400-1603 Heparin level 0.37- improved to therapeutic on IV Heparin infusion at 2500 units/hr  Hgb increased to 13.1, Plts 194K No bleeding or infusion related issues reported by RN  Goal of Therapy:  Heparin level 0.3-0.5 for the first 24 hours after alteplase infusion, then goal of 0.3-0.7 thereafter  Monitor platelets by anticoagulation protocol: Yes   Plan: Continue heparin infusion to 2500 units/hr Check heparin level in 6 hours Daily heparin level and CBC while on heparin Monitor closely for signs/symptoms of bleeding  11/24 PharmD/MBA Candidate Asencion Gowda, Class of 2023

## 2021-06-24 NOTE — Progress Notes (Signed)
RT NOTE:  Pt taken off BiPAP and placed on 15L [place] high flow and 15L NRB. Pt tolerating this well at this time with saturations of 93%. Will try to titrate patient off of the NRB as the day progresses. Pt states he breathing feels good at this time. RT will continue to monitor.

## 2021-06-24 NOTE — Assessment & Plan Note (Addendum)
-   Considered due to bilateral PE -Wean oxygen as able.  Last hospitalization was weaned off of oxygen, however may have greater difficulty given recurrent respiratory problems  -Has been weaning well the last couple days, currently down to 3 L nasal cannula.  Goal is to attempt to wean to room air prior to discharge if able to be accomplished in the next 1 to 2 days, otherwise may need home O2

## 2021-06-24 NOTE — Progress Notes (Signed)
Progress Note    William Moss   FXT:[id number]  DOB: 05-17-1986  DOA: 06/23/2021     1 Date of Service: 06/24/2021   Clinical Course William Moss is a 35 year old male with PMH obesity, HTN, DMII who presented to the hospital with worsening shortness of breath at home. William Moss was recently hospitalized from 06/07/2021 until 06/09/2021 for community-acquired pneumonia.  William Moss required oxygen during that hospitalization but was able to be weaned off prior to discharge.  After discharge William Moss had been less active and less mobile. On admission, William Moss underwent CT angio chest which showed bilateral pulmonary emboli with evidence of right heart straining on CTA.  Echo was difficult for interpretation and could not elaborate on if presence of RV strain. William Moss was evaluated by IR and not considered a candidate for thrombectomy and therefore was started on tPA per pulmonology.  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  Does have a known family history of PE in his dad - outpatient hypercoag workup recommened per pulm - s/p TPA on 10/24 - currently on heparin; Coumadin to start 10/25 with heparin bridge; would suspect could also bridge with Lovenox if approaching discharge time however still on considerable amount of O2 at this time - check LE duplex: Positive for DVT in Left femoral and popliteal vein  Acute respiratory failure with hypoxia (HCC) - Considered due to bilateral PE -Wean oxygen as able.  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  Has worsened since August 2022 (previously 7.4%) - Continue Semglee; will adjust as needed - Continue SSI  and CBG monitoring  Elevated troponin - Suspected due to demand from PE  Hypomagnesemia - repleted as needed  Morbid obesity with BMI of 60.0-69.9, adult (HCC) - Complicates overall prognosis and care - Body mass index is 66.75 kg/m.  Essential hypertension - Continue amlodipine, Coreg, hydralazine -Use labetalol or hydralazine as needed     Subjective:  Patient on BiPAP this am.  Comfortable and no distress in bed.  Able to carry on conversation with me.  Shortness of breath appears to have improved compared to admission.  Objective Vitals:   06/24/21 0900 06/24/21 1000 06/24/21 1100 06/24/21 1200  BP: (!) 149/93 (!) 169/101 (!) 171/100 (!) 165/113  Pulse: 93 94 89 93  Resp: (!) 30 (!) 29 (!) 27 (!) 22  Temp:   99.6 F (37.6 C)   TempSrc:   Axillary   SpO2: 91% 90% 91% 94%  Weight:      Height:       (!) 205 kg  Vital signs were reviewed and unremarkable.   Exam Physical Exam Constitutional:      General: William Moss is not in acute distress.    Appearance: Normal appearance. William Moss is obese.  HENT:     Head: Normocephalic and atraumatic.     Mouth/Throat:     Mouth: Mucous membranes are moist.  Eyes:     Extraocular Movements: Extraocular movements intact.  Cardiovascular:     Rate and Rhythm: Normal rate and regular rhythm.  Pulmonary:     Effort: Pulmonary effort is normal.     Breath sounds: Normal breath sounds.  Abdominal:  General: Bowel sounds are normal. There is no distension.     Palpations: Abdomen is soft.     Tenderness: There is no abdominal tenderness.  Musculoskeletal:        General: No swelling. Normal range of motion.     Cervical back: Normal range of motion and neck supple.  Skin:    General: Skin is warm and dry.  Neurological:     General: No focal deficit present.     Mental Status: William Moss is alert.  Psychiatric:        Mood and Affect: Mood normal.        Behavior: Behavior normal.     Labs / Other Information My review of labs,  imaging, notes and other tests shows no new significant findings.    Disposition Plan: Status is: Inpatient  Remains inpatient appropriate because: Heparin drip, high flow oxygen   Time spent: Greater than 50% of the 35 minute visit was spent in counseling/coordination of care for the patient as laid out in the A&P.  [name], MD [facility] 06/24/2021, 1:57 PM

## 2021-06-24 NOTE — Progress Notes (Signed)
ANTICOAGULATION CONSULT NOTE - Follow Up Consult  Pharmacy Consult for Heparin Indication: pulmonary embolus  Allergies  Allergen Reactions   Lisinopril-Hydrochlorothiazide Other (See Comments)    "Interfered with the pH level in the serum"    Patient Measurements: Height: 5\' 9"  (175.3 cm) Weight: (!) 205 kg (452 lb) IBW/kg (Calculated) : 70.7 Heparin Dosing Weight: 113.6 kg  Vital Signs: Temp: 99.2 F (37.3 C) (10/24 1928) Temp Source: Axillary (10/24 1928) BP: 153/74 (10/24 2348) Pulse Rate: 85 (10/25 0000)  Labs: Recent Labs    06/23/21 0029 06/23/21 0029 06/23/21 0031 06/23/21 0221 06/23/21 0552 06/23/21 0953 06/23/21 1338 06/23/21 1635 06/23/21 2012 06/23/21 2357  HGB 14.1  --   --   --  14.7  --   --  12.2*  --   --   HCT 43.8  --   --   --  44.5  --   --  37.2*  --   --   PLT 293  --   --   --  243  --   --  206  --   --   APTT  --    < > 27  --   --   --  34 30  --  47*  LABPROT  --   --  13.7  --   --   --  14.8 15.8*  --   --   INR  --   --  1.1  --   --   --  1.2 1.3*  --   --   HEPARINUNFRC  --   --   --   --   --  0.21*  --  <0.10*  --  0.19*  CREATININE 1.37*  --   --   --  1.42*  --   --   --  1.51*  --   TROPONINIHS 68*  --   --  115*  --  258*  --   --   --   --    < > = values in this interval not displayed.     Estimated Creatinine Clearance: 119 mL/min (A) (by C-G formula based on SCr of 1.51 mg/dL (H)).   Medications:  Infusions:   sodium chloride 10 mL/hr at 06/24/21 0000   heparin 2,150 Units/hr (06/24/21 0000)    Assessment: 34 yoM admitted on 10/24 with SOB and chest pain, found to have bilateral PE with right heart strain.  He was recently admitted for pneumonia (10/8-10/10).  No prior to admission anticoagulation noted. D-dimer 2.43 (10/24). Baseline coags:  INR 1.1, PTT 27.  Pharmacy consulted to dose IV heparin.  TPA given per CCM. Low dose heparin resumed once post-tpa aptt <80.  Today, 06/24/2021: S/p TPA for PE on 10/24-  infused from 1400-1603 Heparin level 0.19- subtherapeutic on IV Heparin infusion at 2150 units/hr  Hgb 12.2 (decreased), Plts WNL No bleeding or infusion related issues reported by RN  Goal of Therapy:  Heparin level 0.3-0.5 for the first 24 hours after alteplase infusion Monitor platelets by anticoagulation protocol: Yes   Plan: Increase heparin infusion to 2500 units/hr Check heparin level in 6 hours Daily heparin level and CBC while on heparin Monitor closely for signs/symptoms of bleeding  11/24, PharmD, BCPS [facility] main pharmacy [phone] 06/24/2021 2:45 AM

## 2021-06-24 NOTE — Hospital Course (Signed)
William Moss is a 35 year old male with PMH obesity, HTN, DMII who presented to the hospital with worsening shortness of breath at home. He was recently hospitalized from 06/07/2021 until 06/09/2021 for community-acquired pneumonia.  He required oxygen during that hospitalization but was able to be weaned off prior to discharge.  After discharge he had been less active and less mobile. On admission, he underwent CT angio chest which showed bilateral pulmonary emboli with evidence of right heart straining on CTA.  Echo was difficult for interpretation and could not elaborate on if presence of RV strain. He was evaluated by IR and not considered a candidate for thrombectomy and therefore was started on tPA per pulmonology.

## 2021-06-24 NOTE — Progress Notes (Signed)
Patient did not tolerate pausing BIPAP machine for mouth care. O2 sats dropped to 74% and patient became even more tachypneic. He also stated that he feels like he needs the BIPAP. BIPAP was placed back onto patient. Will continue to assess. Anda Kraft, RN 06/24/2021

## 2021-06-24 NOTE — TOC Initial Note (Signed)
Transition of Care [facility]) - Initial/Assessment Note    Patient Details  Name: William Moss MRN: [id number] Date of Birth: 06/05/1986  Transition of [facility]) CM/SW Contact:    Golda Acre, RN Phone Number: 06/24/2021, 9:53 AM  Clinical Narrative:                  35 year old gentleman with a history of recent pneumonia who presented with sudden onset shortness of breath and chest tightness yesterday.  He had some dizziness before presentation. Since admission he is feeling mildly improved on BiPAP.  He was hospitalized recently with community-acquired pneumonia, but was discharged from the hospital not on home oxygen.  He has a family history of PE in his father, which was fatal.  No previous personal history of clots.  He has not had any bleeding, no history of stroke or CNS surgery.  No previous brain hemorrhage.   Upon presentation to the ED he had saturations in the high 40s.  CT demonstrated bilateral lower lobe PEs.  CT demonstrated dilated RV and RA.  He was started empirically on IV heparin.  He has been hypertensive since admission.   Pertinent  Medical History  Recent CAP- hospitalized   Significant Hospital Events: Including procedures, antibiotic start and stop dates in addition to other pertinent events   10/23 admitted 10/24 50mg  peripheral TPA  Expected Discharge Plan: Home/Self Care Barriers to Discharge: Continued Medical Work up   Patient Goals and CMS Choice Patient states their goals for this hospitalization and ongoing recovery are:: to go home CMS Medicare.gov Compare Post Acute Care list provided to:: Patient    Expected Discharge Plan and Services Expected Discharge Plan: Home/Self Care   Discharge Planning Services: CM Consult   Living arrangements for the past 2 months: Single Family Home                                      Prior Living Arrangements/Services Living arrangements for the past 2 months: Single Family Home Lives  with:: Self Patient language and need for interpreter reviewed:: Yes Do you feel safe going back to the place where you live?: Yes            Criminal Activity/Legal Involvement Pertinent to Current Situation/Hospitalization: No - Comment as needed  Activities of Daily Living Home Assistive Devices/Equipment: CBG Meter ADL Screening (condition at time of admission) Patient's cognitive ability adequate to safely complete daily activities?: Yes Is the patient deaf or have difficulty hearing?: No Does the patient have difficulty seeing, even when wearing glasses/contacts?: No Does the patient have difficulty concentrating, remembering, or making decisions?: No Patient able to express need for assistance with ADLs?: Yes Does the patient have difficulty dressing or bathing?: No Independently performs ADLs?: Yes (appropriate for developmental age) Does the patient have difficulty walking or climbing stairs?: No Weakness of Legs: None Weakness of Arms/Hands: None  Permission Sought/Granted                  Emotional Assessment Appearance:: Appears stated age     Orientation: : Oriented to Self, Oriented to  Time, Oriented to Place, Oriented to Situation Alcohol / Substance Use: Not Applicable Psych Involvement: No (comment)  Admission diagnosis:  Shortness of breath [R06.02] Hypoxia [R09.02] Acute pulmonary embolism (HCC) [I26.99] Other acute pulmonary embolism with acute cor pulmonale (HCC) [I26.09] Patient Active Problem List   Diagnosis Date  Noted   Acute pulmonary embolism (HCC) 06/23/2021   Elevated troponin 06/23/2021   Hypomagnesemia 06/23/2021   Leukocytosis 06/23/2021   Pneumonia 06/07/2021   CKD (chronic kidney disease), stage II 06/07/2021   AKI (acute kidney injury) (HCC) 08/27/2020   Insulin dependent type 2 diabetes mellitus (HCC) 08/27/2020   Metabolic syndrome 08/27/2020   Diabetes mellitus due to underlying condition, controlled, with complication,  without long-term current use of insulin (HCC)    Pure hypercholesterolemia    Morbid obesity with BMI of 60.0-69.9, adult (HCC)    Diabetes mellitus type 2, uncontrolled, with complications 06/28/2019   Acute respiratory failure with hypoxia (HCC) 06/28/2019   Sinus tachycardia 06/27/2019   COVID-19 virus infection 06/27/2019   Sleep apnea 03/27/2019   Normal coronary arteries 03/27/2019   GERD (gastroesophageal reflux disease) 10/02/2016   Coronary artery disease involving native heart    Abnormal nuclear stress test    Atypical chest pain 05/27/2016   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

## 2021-06-24 NOTE — Progress Notes (Signed)
Inpatient Diabetes Program Recommendations  AACE/ADA: New Consensus Statement on Inpatient Glycemic Control (2015)  Target Ranges:  Prepandial:   less than 140 mg/dL      Peak postprandial:   less than 180 mg/dL (1-2 hours)      Critically ill patients:  140 - 180 mg/dL   Lab Results  Component Value Date   GLUCAP 256 (H) 06/24/2021   HGBA1C 7.4 (H) 04/17/2021    Review of Glycemic Control Results for ANUJ, SUMMONS (MRN [id number]) as of 06/24/2021 08:15  Ref. Range 06/23/2021 07:52 06/23/2021 13:26 06/23/2021 16:33 06/23/2021 20:22 06/23/2021 22:12 06/24/2021 07:56  Glucose-Capillary Latest Ref Range: 70 - 99 mg/dL 959 (H) 747 (H) 185 (H) 259 (H) 232 (H) 256 (H)   Diabetes history: DM 2 Outpatient Diabetes medications: Levemir 15 units, Metformin 1000 mg bid Current orders for Inpatient glycemic control:  Levemir 10 units Novolog 0-15 units tid + hs  A1c 7.4% on 8/18  Inpatient Diabetes Program Recommendations:    - Increase Levemir to 18 units   Thanks,  Christena Deem RN, MSN, BC-ADM Inpatient Diabetes Coordinator Team Pager [phone] (8a-5p)

## 2021-06-24 NOTE — Progress Notes (Addendum)
eLink Physician-Brief Progress Note Patient Name: William Moss DOB: 12-01-1985 MRN: [id number]   Date of Service  06/24/2021  HPI/Events of Note  Video: RR > 30. Sats and MAP fine. Ve > 30 lit on BiPAP.  Discussed with RN. Not in pain No confusion Denies any discomfort. Just more tachypnea.   Asking for morphine?Marland Kitchen For above.   eICU Interventions  PE can cause resp alkalosis, tachypnea.  Get CxR and ABG. No morphine at this time.       Intervention Category Intermediate Interventions: Respiratory distress - evaluation and management  Ranee Gosselin 06/24/2021, 3:08 AM  6 AM CxR worsening left lower effusion/atelectasis. Peri hilar congestion. ABG stable type 2 failure.  Continue care

## 2021-06-24 NOTE — Assessment & Plan Note (Signed)
-   Suspected due to demand from PE

## 2021-06-24 NOTE — Progress Notes (Signed)
Patient appears to be tachypneic with respirations in the 40's. O2 saturations are 93% while on 100% FiO2. Notified MD Marlane Mingle. Will continue to monitor. Anda Kraft, RN 06/24/2021

## 2021-06-24 NOTE — Assessment & Plan Note (Addendum)
-   repleted as needed

## 2021-06-24 NOTE — Progress Notes (Addendum)
NAME:  CEEJAY KEGLEY, MRN:  [id number], DOB:  12-18-1985, LOS: 1 ADMISSION DATE:  06/23/2021, CONSULTATION DATE:  10/24 REFERRING MD:  Meryle Ready, CHIEF COMPLAINT:  submassive PE, hypoxic respiratory failure  History of Present Illness:  Mr. Raymundo is a 35 year old gentleman with a history of recent pneumonia who presented with sudden onset shortness of breath and chest tightness yesterday.  He had some dizziness before presentation. Since admission he is feeling mildly improved on BiPAP.  He was hospitalized recently with community-acquired pneumonia, but was discharged from the hospital not on home oxygen.  He has a family history of PE in his father, which was fatal.  No previous personal history of clots.  He has not had any bleeding, no history of stroke or CNS surgery.  No previous brain hemorrhage.  Upon presentation to the ED he had saturations in the high 40s.  CT demonstrated bilateral lower lobe PEs.  CT demonstrated dilated RV and RA.  He was started empirically on IV heparin.  He has been hypertensive since admission.  Pertinent  Medical History  Recent CAP- hospitalized  Significant Hospital Events: Including procedures, antibiotic start and stop dates in addition to other pertinent events   10/23 admitted 10/24 50mg  peripheral TPA  Interim History / Subjective:  TPA yesterday- had worse sats during administration of TPA but recovered after infusion. Low-grade fever with TPA. Still dyspneic overnight, but feeling improved this morning.  Objective   Blood pressure (!) 152/113, pulse 91, temperature 99.2 F (37.3 C), temperature source Axillary, resp. rate (!) 25, height 5\' 9"  (1.753 m), weight (!) 205 kg, SpO2 97 %.    FiO2 (%):  [100 %] 100 %   Intake/Output Summary (Last 24 hours) at 06/24/2021 0707 Last data filed at 06/24/2021 0617 Gross per 24 hour  Intake 514.8 ml  Output 2400 ml  Net -1885.2 ml    Filed Weights   06/23/21 0023 06/23/21 0500  Weight: (!)  172.4 kg (!) 205 kg    Examination: General: chronically ill appearing man lying in bed in NAD HENT: [place]/AT, eyes anicteric, BiPAP mask in place. Lungs: tachypnea improved, RR in 20s. Distant lung sounds. No accessory muscle use. Cardiovascular: S1S2, RRR  Abdomen: obese, soft, NT Extremities: edema improved, no cyanosis or clubbing Neuro: awake and alert, moving all extremities quickly on command, answering questions appropriately Derm: warm, dry  ABG    Component Value Date/Time   PHART 7.331 (L) 06/24/2021 0341   PCO2ART 41.2 06/24/2021 0341   PO2ART 82 (L) 06/24/2021 0341   HCO3 21.7 06/24/2021 0341   TCO2 23 06/24/2021 0341   ACIDBASEDEF 4.0 (H) 06/24/2021 0341   O2SAT 95.0 06/24/2021 0341   CBC, BMP pending.  Resolved Hospital Problem list     Assessment & Plan:  Acute respiratory failure with hypoxia Submassive PE with RV dilation on CT; echo unable to assess RV function and size. Had rising troponin. BNP likely artificially low due to BMI. PESI class II, but I feel that based on ongoing CP and SOB with profound hypoxia, he is likely higher risk than what his risk score would predict, especially with baseline severe obesity that could limit his cardiopulmonary reserve. Better after TPA-- CP resolved, SOB better, saturations better. -Con't heparin for AC. Eventually can switch to coumadin. After talking to pharmacy, there is likely insufficient data to support safe use of DOAC at his BMI. Start coumadin tonight, will need minimum 5 days of bridging with coumadin. -con't tele monitoring -wean supplemental  O2-- down to 60% FiO2. Trial on HHFNC today with BiPAP PRN -increase in bed mobility, can be OOB later today if he can tolerate -He will need 3-6 months AC; even though this PE seems provoked from recent hospitalization, with his family history it may be advisable to have a hypercoagulability workup done when he can safely come off AC. Consideration for lifelong AC is very  reasonable.  Hypertension -con't PTA antihypertensives. -hydralazine PRN IV or cleviprex PRN  Obesity -long term modest weight loss will be important for his overall health  Hyperglycemia, uncontrolled despite minimal PO intake-- suspect D5w in heparin and stress response explain this. -per primary  Best Practice (right click and "Reselect all SmartList Selections" daily)   Diet/type: NPO DVT prophylaxis: systemic heparin GI prophylaxis: N/A Lines: yes and it is still needed Foley:  N/A Code Status:  full code Last date of multidisciplinary goals of care discussion [discussed with patient at bedside 10/24]  Labs   CBC: Recent Labs  Lab 06/23/21 0029 06/23/21 0552 06/23/21 1635 06/24/21 0341  WBC 13.8* 12.7* 13.3*  --   NEUTROABS 11.1*  --   --   --   HGB 14.1 14.7 12.2* 13.3  HCT 43.8 44.5 37.2* 39.0  MCV 81.9 81.2 82.3  --   PLT 293 243 206  --      Basic Metabolic Panel: Recent Labs  Lab 06/23/21 0029 06/23/21 0221 06/23/21 0552 06/23/21 2012 06/24/21 0341  NA 133*  --  136 137 141  K 4.9  --  5.4* 4.0 4.0  CL 104  --  107 107  --   CO2 21*  --  18* 21*  --   GLUCOSE 360*  --  349* 297*  --   BUN 20  --  19 18  --   CREATININE 1.37*  --  1.42* 1.51*  --   CALCIUM 9.4  --  9.2 8.7*  --   MG  --  1.6* 1.8  --   --   PHOS  --   --  3.8  --   --     GFR: Estimated Creatinine Clearance: 119 mL/min (A) (by C-G formula based on SCr of 1.51 mg/dL (H)).   Steffanie Dunn, DO 06/24/21 8:47 AM [facility]

## 2021-06-25 ENCOUNTER — Telehealth: Payer: Self-pay | Admitting: Critical Care Medicine

## 2021-06-25 DIAGNOSIS — Z7901 Long term (current) use of anticoagulants: Secondary | ICD-10-CM

## 2021-06-25 LAB — BASIC METABOLIC PANEL
Anion gap: 11 (ref 5–15)
BUN: 24 mg/dL — ABNORMAL HIGH (ref 6–20)
CO2: 22 mmol/L (ref 22–32)
Calcium: 8.8 mg/dL — ABNORMAL LOW (ref 8.9–10.3)
Chloride: 103 mmol/L (ref 98–111)
Creatinine, Ser: 1.56 mg/dL — ABNORMAL HIGH (ref 0.61–1.24)
GFR, Estimated: 58 mL/min — ABNORMAL LOW (ref >60)
Glucose, Bld: 187 mg/dL — ABNORMAL HIGH (ref 70–99)
Potassium: 3.7 mmol/L (ref 3.5–5.1)
Sodium: 136 mmol/L (ref 135–145)

## 2021-06-25 LAB — CBC WITH DIFFERENTIAL/PLATELET
Abs Immature Granulocytes: 0.05 10*3/uL (ref 0.00–0.07)
Basophils Absolute: 0 10*3/uL (ref 0.0–0.1)
Basophils Relative: 0 %
Eosinophils Absolute: 0.4 10*3/uL (ref 0.0–0.5)
Eosinophils Relative: 4 %
HCT: 39.8 % (ref 39.0–52.0)
Hemoglobin: 12.8 g/dL — ABNORMAL LOW (ref 13.0–17.0)
Immature Granulocytes: 0 %
Lymphocytes Relative: 19 %
Lymphs Abs: 2.3 10*3/uL (ref 0.7–4.0)
MCH: 26.9 pg (ref 26.0–34.0)
MCHC: 32.2 g/dL (ref 30.0–36.0)
MCV: 83.6 fL (ref 80.0–100.0)
Monocytes Absolute: 1.3 10*3/uL — ABNORMAL HIGH (ref 0.1–1.0)
Monocytes Relative: 11 %
Neutro Abs: 7.7 10*3/uL (ref 1.7–7.7)
Neutrophils Relative %: 66 %
Platelets: 212 10*3/uL (ref 150–400)
RBC: 4.76 MIL/uL (ref 4.22–5.81)
RDW: 14.7 % (ref 11.5–15.5)
WBC: 11.7 10*3/uL — ABNORMAL HIGH (ref 4.0–10.5)
nRBC: 0 % (ref 0.0–0.2)

## 2021-06-25 LAB — GLUCOSE, CAPILLARY
Glucose-Capillary: 221 mg/dL — ABNORMAL HIGH (ref 70–99)
Glucose-Capillary: 295 mg/dL — ABNORMAL HIGH (ref 70–99)
Glucose-Capillary: 256 mg/dL — ABNORMAL HIGH (ref 70–99)
Glucose-Capillary: 268 mg/dL — ABNORMAL HIGH (ref 70–99)

## 2021-06-25 LAB — PROTIME-INR
INR: 1.1 (ref 0.8–1.2)
Prothrombin Time: 14.3 seconds (ref 11.4–15.2)

## 2021-06-25 LAB — MAGNESIUM: Magnesium: 2 mg/dL (ref 1.7–2.4)

## 2021-06-25 LAB — HEPARIN LEVEL (UNFRACTIONATED): Heparin Unfractionated: 0.53 IU/mL (ref 0.30–0.70)

## 2021-06-25 MED ORDER — LIVING WELL WITH DIABETES BOOK
Freq: Once | Status: AC
  Filled 2021-06-25: qty 1

## 2021-06-25 MED ORDER — WARFARIN SODIUM 5 MG PO TABS
10.0000 mg | ORAL_TABLET | Freq: Once | ORAL | Status: AC
  Administered 2021-06-25: 10 mg via ORAL
  Filled 2021-06-25: qty 2

## 2021-06-25 NOTE — Progress Notes (Signed)
NAME:  William Moss, MRN:  [id number], DOB:  [birth year], LOS: 2 ADMISSION DATE:  06/23/2021, CONSULTATION DATE:  10/24 REFERRING MD:  Meryle Ready, CHIEF COMPLAINT:  submassive PE, hypoxic respiratory failure  History of Present Illness:  William Moss is a 35 year old gentleman with a history of recent pneumonia who presented with sudden onset shortness of breath and chest tightness yesterday.  He had some dizziness before presentation. Since admission he is feeling mildly improved on BiPAP.  He was hospitalized recently with community-acquired pneumonia, but was discharged from the hospital not on home oxygen.  He has a family history of PE in his father, which was fatal.  No previous personal history of clots.  He has not had any bleeding, no history of stroke or CNS surgery.  No previous brain hemorrhage.  Upon presentation to the ED he had saturations in the high 40s.  CT demonstrated bilateral lower lobe PEs.  CT demonstrated dilated RV and RA.  He was started empirically on IV heparin.  He has been hypertensive since admission.  Pertinent  Medical History  Recent CAP- hospitalized  Significant Hospital Events: Including procedures, antibiotic start and stop dates in addition to other pertinent events   10/23 admitted 10/24 50mg  peripheral TPA  Interim History / Subjective:  Back on BiPAP overnight. Needed HFNC and NRB yesterday when off bipap.  Feeling well this morning.  Has not been out of bed yet.  Coughing some, but no hemoptysis.  Objective   Blood pressure (!) 149/92, pulse 85, temperature 98.8 F (37.1 C), temperature source Axillary, resp. rate (!) 35, height 5\' 9"  (1.753 m), weight (!) 205 kg, SpO2 92 %.    FiO2 (%):  [60 %-100 %] 100 %   Intake/Output Summary (Last 24 hours) at 06/25/2021 0803 Last data filed at 06/25/2021 0600 Gross per 24 hour  Intake 804.75 ml  Output 1850 ml  Net -1045.25 ml    Filed Weights   06/23/21 0023 06/23/21 0500  Weight: (!) 172.4 kg  (!) 205 kg    Examination: General: Chronically ill-appearing man lying in bed no acute distress HENT: [place]/AT, eyes anicteric Lungs: t tachypnea improving, no accessory muscle use, CTA B, distant lung sounds.  On high flow nasal cannula. Cardiovascular: S1-S2, regular rate and rhythm Abdomen: Obese, soft, nontender, nondistended Extremities: No clubbing or cyanosis, mild lower extremity edema Neuro: Awake and alert, answering questions appropriately, moving all extremities Derm: Warm, dry, no rashes  WBC 11.7 H/H 12.8/ 39.8 BUN 24 Cr 1.56 INR 1.1  Resolved Hospital Problem list     Assessment & Plan:  Acute respiratory failure with hypoxia Submassive PE with RV dilation on CT; echo unable to assess RV function and size. Had rising troponin. BNP likely artificially low due to BMI. PESI class II, but I feel that based on ongoing CP and SOB with profound hypoxia, he is likely higher risk than what his risk score would predict, especially with baseline severe obesity that could limit his cardiopulmonary reserve. Better after TPA-- CP resolved, SOB better, saturations better. -Started warfarin last night, con't heparin. Needs 5 days of therapeutic overlap. DOACs are not a great option given BMI and limited data in this population.  We discussed anticoagulation precautions-anytime he develops significant bleeding he needs to be evaluated by a physician.  If he ever has head trauma while on anticoagulation he should report to the emergency room to be evaluated.  He understands the risk of spontaneous or traumatic bleeding is higher on anticoagulation. -  He will need to be set up with the Coumadin clinic at discharge. -Continue tele monitoring -Con't weaning FiO2. Agree with BiPAP overnight, can use PRN during the day. -Progressive mobility, OOB mobility as able. -He will need 3-6 months AC; even though this PE seems provoked from recent hospitalization, with his family history it may be  advisable to have a hypercoagulability workup done when he can safely come off AC. Consideration for lifelong AC is very reasonable.  Hypertension -Per primary  Obesity -long term modest weight loss will be important for his overall health  Hyperglycemia, uncontrolled despite minimal PO intake-- suspect D5w in heparin and stress response explain this. -per primary  Best Practice (right click and "Reselect all SmartList Selections" daily)   Diet/type: NPO DVT prophylaxis: systemic heparin GI prophylaxis: N/A Lines: yes and it is still needed Foley:  N/A Code Status:  full code Last date of multidisciplinary goals of care discussion [discussed with patient at bedside 10/24]  Labs   CBC: Recent Labs  Lab 06/23/21 0029 06/23/21 0552 06/23/21 1635 06/24/21 0341 06/24/21 0909 06/25/21 0430  WBC 13.8* 12.7* 13.3*  --  14.9* 11.7*  NEUTROABS 11.1*  --   --   --   --  7.7  HGB 14.1 14.7 12.2* 13.3 13.1 12.8*  HCT 43.8 44.5 37.2* 39.0 40.9 39.8  MCV 81.9 81.2 82.3  --  82.8 83.6  PLT 293 243 206  --  194 212     Basic Metabolic Panel: Recent Labs  Lab 06/23/21 0029 06/23/21 0221 06/23/21 0552 06/23/21 2012 06/24/21 0341 06/24/21 0909 06/25/21 0430  NA 133*  --  136 137 141 138 136  K 4.9  --  5.4* 4.0 4.0 3.8 3.7  CL 104  --  107 107  --  106 103  CO2 21*  --  18* 21*  --  21* 22  GLUCOSE 360*  --  349* 297*  --  317* 187*  BUN 20  --  19 18  --  22* 24*  CREATININE 1.37*  --  1.42* 1.51*  --  1.53* 1.56*  CALCIUM 9.4  --  9.2 8.7*  --  8.6* 8.8*  MG  --  1.6* 1.8  --   --   --  2.0  PHOS  --   --  3.8  --   --   --   --     GFR: Estimated Creatinine Clearance: 114.1 mL/min (A) (by C-G formula based on SCr of 1.56 mg/dL (H)).   Steffanie Dunn, DO 06/25/21 10:26 AM [facility]

## 2021-06-25 NOTE — Telephone Encounter (Signed)
Please schedule William Moss for follow up in 4-6 weeks with Dr. Judeth Horn.  Steffanie Dunn, DO 06/25/21 10:36 AM [facility]

## 2021-06-25 NOTE — Telephone Encounter (Signed)
I have made a follow up for the patient with Dr. Judeth Horn as a new patient and this will print with the hospital discharge summary.

## 2021-06-25 NOTE — Progress Notes (Signed)
Patient's O2 Sats were 89 while on 80% FiO2 BIPAP. Patient's FiO2 was increased to 100% and O2 Sats are now 99. Anda Kraft, RN 06/25/2021

## 2021-06-25 NOTE — Progress Notes (Signed)
Flow Progress Note    William Moss   VFI:[id number]  DOB: 11/15/1985  DOA: 06/23/2021     2 Date of Service: 06/25/2021   Clinical Course William Moss is a 35 year old male with PMH obesity, HTN, DMII who presented to the hospital with worsening shortness of breath at home. He was recently hospitalized from 06/07/2021 until 06/09/2021 for community-acquired pneumonia.  He required oxygen during that hospitalization but was able to be weaned off prior to discharge.  After discharge he had been less active and less mobile. On admission, he underwent CT angio chest which showed bilateral pulmonary emboli with evidence of right heart straining on CTA.  Echo was difficult for interpretation and could not elaborate on if presence of RV strain. He was evaluated by IR and not considered a candidate for thrombectomy and therefore was started on tPA per pulmonology.   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  Does have a known family history of PE in his dad - outpatient hypercoag workup recommened per pulm - s/p TPA on 10/24 - currently on heparin; Coumadin to start 10/25 with heparin bridge; would suspect could also bridge with Lovenox if approaching discharge time however still on considerable amount of O2 at this time - LE duplex: Positive for DVT in Left femoral and popliteal vein  Acute respiratory failure with hypoxia (HCC) - Considered due to bilateral PE -Wean oxygen as able.  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  Has worsened since August 2022 (previously 7.4%) - Continue Semglee; will adjust as needed -  Continue SSI and CBG monitoring  Elevated troponin - Suspected due to demand from PE  Hypomagnesemia - repleted as needed  Morbid obesity with BMI of 60.0-69.9, adult (HCC) - Complicates overall prognosis and care - Body mass index is 66.75 kg/m.  Essential hypertension - Continue amlodipine, Coreg, hydralazine -Use labetalol or hydralazine as needed     Subjective:  No events overnight.  Weaned to salter HF this morning and diet started. Denies CP and is having improvement in SOB.   Objective Vitals:   06/25/21 1200 06/25/21 1225 06/25/21 1230 06/25/21 1300  BP: (!) 150/105 (!) 150/105 (!) 163/87 (!) 142/82  Pulse: 89 92 90 90  Resp: (!) 24 (!) 22 18 (!) 23  Temp: 98.8 F (37.1 C)     TempSrc: Axillary     SpO2: 91% 92% 95% 91%  Weight:      Height:       (!) 205 kg  Vital signs were reviewed and unremarkable.   Exam Physical Exam Constitutional:      General: He is not in acute distress.    Appearance: He is well-developed. He is obese.  HENT:     Head: Normocephalic and atraumatic.  Eyes:     Extraocular Movements: Extraocular movements intact.  Cardiovascular:     Rate and Rhythm: Normal rate and regular rhythm.  Pulmonary:     Effort: Pulmonary effort is normal.     Breath sounds: Normal breath sounds.  Chest:     Chest wall: No tenderness.  Abdominal:     General: Bowel sounds are normal.  Palpations: Abdomen is soft.  Musculoskeletal:        General: Normal range of motion.     Cervical back: Normal range of motion and neck supple.  Skin:    General: Skin is warm and dry.  Neurological:     General: No focal deficit present.     Mental Status: He is alert.  Psychiatric:        Mood and Affect: Mood normal.        Behavior: Behavior normal.     Labs / Other Information My review of labs, imaging, notes and other tests shows no new significant findings.    Disposition Plan: Status is: Inpatient  Remains inpatient appropriate  because: ongoing high O2 demand   Time spent: Greater than 50% of the 35 minute visit was spent in counseling/coordination of care for the patient as laid out in the A&P.  William Chamber, MD [facility] 06/25/2021, 3:04 PM

## 2021-06-25 NOTE — Evaluation (Signed)
Physical Therapy Evaluation Patient Details Name: William Moss MRN: [id number] DOB: [birth year] Today's Date: 06/25/2021  History of Present Illness  William Moss is a 35 year old male with PMH obesity, HTN, DMII who presented to the [facility] with worsening shortness of breath at home 10 24/22. He was recently hospitalized from 06/07/2021 until 06/09/2021 for community-acquired pneumonia.  He required oxygen during that hospitalization but was able to be weaned off prior to discharge.   On admission, he underwent CT angio chest which showed bilateral pulmonary emboli with evidence of right heart straining on CTA.  Echo was difficult for interpretation and could not elaborate on if presence of RV strain. He was evaluated by IR and not considered a candidate for thrombectomy and therefore was started on tPA per pulmonology.    Clinical Impression  The patient tolerated  OOB to recliner. Patient  on 15 L HFNC. SPO2 resting 89%, dropped to 845 with mobility. . Patient able to stand with mod assist and take  a few steps to pivot with mod assistance, patient reports leg weakness. .  After transfer, BP145/106 , RN in room throughout.  Patient dyspnea 3-4/4.  Patient will be alone at home, roommate works.  Pt admitted with above diagnosis.  Pt currently with functional limitations due to the deficits listed below (see PT Problem List). Pt will benefit from skilled PT to increase their independence and safety with mobility to allow discharge to the venue listed below.        Recommendations for follow up therapy are one component of a multi-disciplinary discharge planning process, led by the attending physician.  Recommendations may be updated based on patient status, additional functional criteria and insurance authorization.  Follow Up Recommendations Home health PT    Assistance Recommended at Discharge Intermittent Supervision/Assistance  Functional Status Assessment Patient has had a recent decline  in their functional status and demonstrates the ability to make significant improvements in function in a reasonable and predictable amount of time.  Equipment Recommendations  Rolling walker (2 wheels)    Recommendations for Other Services OT consult     Precautions / Restrictions Precautions Precautions: Fall Precaution Comments: on 15 L HFNC , desats      Mobility  Bed Mobility Overal bed mobility: Needs Assistance Bed Mobility: Supine to Sit     Supine to sit: William Moss elevated;Min assist     General bed mobility comments: extra time, assist with trunk.    Transfers Overall transfer level: Needs assistance Equipment used: 2 person hand held assist Transfers: Sit to/from William Moss Sit to Stand: Mod assist Stand pivot transfers: Mod assist         General transfer comment: Extra effort to stand from bed, to power up. Pivot steps to recliner wioth UE support.  SPO2 84% on 15 L HFNC./    Ambulation/Gait                Stairs            Wheelchair Mobility    Modified Rankin (Stroke Patients Only)       Balance Overall balance assessment: Needs assistance Sitting-balance support: Bilateral upper extremity supported;Feet supported Sitting balance-William Moss: Fair     Standing balance support: During functional activity;Bilateral upper extremity supported Standing balance-William Moss: Poor Standing balance comment: reliant on support of UE's  Pertinent Vitals/Pain Pain Assessment: No/denies pain    Home Living Family/patient expects to be discharged to:: Private residence Living Arrangements: Non-relatives/Friends Available Help at Discharge: Friend(s) Type of Home: House Home Access: Stairs to enter       Home Layout: One level Home Equipment: None      Prior Function Prior Level of Function : Independent/Modified Independent                     Hand Dominance    Dominant Hand: Right    Extremity/Trunk Assessment   Upper Extremity Assessment Upper Extremity Assessment: Overall WFL for tasks assessed    Lower Extremity Assessment Lower Extremity Assessment: Generalized weakness    Cervical / Trunk Assessment Cervical / Trunk Assessment: Normal  Communication   Communication: No difficulties  Cognition Arousal/Alertness: Awake/alert Behavior During Therapy: WFL for tasks assessed/performed Overall Cognitive Status: Within Functional Limits for tasks assessed                                          General Comments      Exercises     Assessment/Plan    PT Assessment Patient needs continued PT services  PT Problem List Decreased strength;Decreased mobility;Decreased safety awareness;Cardiopulmonary status limiting activity;Decreased activity tolerance;Decreased knowledge of precautions       PT Treatment Interventions DME instruction;Therapeutic activities;Gait training;Therapeutic exercise;Patient/family education;Functional mobility training;Stair training    PT Goals (Current goals can be found in the Care Plan section)  Acute Rehab PT Goals Patient Stated Goal: to  get up. walk PT Goal Formulation: With patient Time For Goal Achievement: 07/09/21 Potential to Achieve Goals: Good    Frequency Min 3X/week   Barriers to discharge Decreased caregiver support      Co-evaluation               AM-PAC PT "6 Clicks" Mobility  Outcome Measure Help needed turning from your back to your side while in a flat bed without using bedrails?: A Little Help needed moving from lying on your back to sitting on the side of a flat bed without using bedrails?: A Lot Help needed moving to and from a bed to a chair (including a wheelchair)?: A Lot Help needed standing up from a chair using your arms (e.g., wheelchair or bedside chair)?: A Lot Help needed to walk in Moss room?: Total Help needed climbing 3-5 steps  with a railing? : Total 6 Click Score: 11    End of Session Equipment Utilized During Treatment: Oxygen Activity Tolerance: Treatment limited secondary to medical complications (Comment) (SOB, desats) Patient left: in chair;with call bell/phone within reach;with nursing/sitter in room Nurse Communication: Mobility status PT Visit Diagnosis: Unsteadiness on feet (R26.81);Difficulty in walking, not elsewhere classified (R26.2)    Time: 3086-5784 PT Time Calculation (min) (ACUTE ONLY): 20 min   Charges:   PT Evaluation $PT Eval Moderate Complexity: 1 Mod          Blanchard Kelch PT Acute Rehabilitation Services Pager [phone] Office [phone]   Rada Hay 06/25/2021, 1:26 PM

## 2021-06-25 NOTE — Progress Notes (Addendum)
Inpatient Diabetes Program Recommendations  AACE/ADA: New Consensus Statement on Inpatient Glycemic Control (2015)  Target Ranges:  Prepandial:   less than 140 mg/dL      Peak postprandial:   less than 180 mg/dL (1-2 hours)      Critically ill patients:  140 - 180 mg/dL   Lab Results  Component Value Date   GLUCAP 295 (H) 06/25/2021   HGBA1C 10.8 (H) 06/24/2021    Diabetes history: DM 2 Outpatient Diabetes medications: Levemir 15 units, Metformin 1000 mg bid Current orders for Inpatient glycemic control:  Levemir 10 units Novolog 0-15 units tid + hs   A1c 7.4% on 8/18 Now 10.8 on 06/24/21   Inpatient Diabetes Program Recommendations:     - Increase Levemir to 18 units   Spoke with pt about A1C results 10.8 and explained what an A1C is, basic pathophysiology of DM Type 2, basic home care, basic diabetes diet nutrition principles, importance of checking CBGs and maintaining good CBG control to prevent long-term and short-term complications. Reviewed signs and symptoms of hyperglycemia and hypoglycemia and how to treat hypoglycemia at home. Also reviewed blood sugar goals at home.  Patient states he does not know of anything that has changed to influence rise in A1c other than decrease in exercise during illness. States understanding and willingness to check CBGs and take to MD appointments for review.    Thank you, Billy Fischer. Vonetta Foulk, RN, MSN, CDE  Diabetes Coordinator Inpatient Glycemic Control Team Team Pager [phone] (8am-5pm) 06/25/2021 1:15 PM

## 2021-06-25 NOTE — Progress Notes (Addendum)
ANTICOAGULATION CONSULT NOTE - Follow Up Consult  Pharmacy Consult for Heparin, Warfarin Indication: pulmonary embolus and DVT  Allergies  Allergen Reactions   Lisinopril-Hydrochlorothiazide Other (See Comments)    "Interfered with the pH level in the serum"    Patient Measurements: Height: 5\' 9"  (175.3 cm) Weight: (!) 205 kg (452 lb) IBW/kg (Calculated) : 70.7 Heparin Dosing Weight: 113.6 kg  Vital Signs: Temp: 98.8 F (37.1 C) (10/26 0400) Temp Source: Axillary (10/26 0400) BP: 150/117 (10/26 0400) Pulse Rate: 90 (10/26 0400)  Labs: Recent Labs    06/23/21 0029 06/23/21 0031 06/23/21 0221 06/23/21 0552 06/23/21 0953 06/23/21 1338 06/23/21 1635 06/23/21 2012 06/23/21 2357 06/24/21 0341 06/24/21 0909 06/24/21 1530 06/25/21 0430  HGB 14.1  --   --    < >  --   --  12.2*  --   --  13.3 13.1  --  12.8*  HCT 43.8  --   --    < >  --   --  37.2*  --   --  39.0 40.9  --  39.8  PLT 293  --   --    < >  --   --  206  --   --   --  194  --  212  APTT  --    < >  --   --   --  34 30  --  47*  --   --   --   --   LABPROT  --    < >  --   --   --  14.8 15.8*  --   --   --   --   --  14.3  INR  --    < >  --   --   --  1.2 1.3*  --   --   --   --   --  1.1  HEPARINUNFRC  --   --   --    < > 0.21*  --  <0.10*  --  0.19*  --  0.37 0.42 0.53  CREATININE 1.37*  --   --    < >  --   --   --  1.51*  --   --  1.53*  --  1.56*  TROPONINIHS 68*  --  115*  --  258*  --   --   --   --   --   --   --   --    < > = values in this interval not displayed.     Estimated Creatinine Clearance: 114.1 mL/min (A) (by C-G formula based on SCr of 1.56 mg/dL (H)).   Medications:  Infusions:   sodium chloride 10 mL/hr at 06/25/21 0452   heparin 2,500 Units/hr (06/25/21 0452)    Assessment: 34 yoM admitted on 10/24 with SOB and chest pain, found to have bilateral PE with right heart strain.  He was recently admitted for pneumonia (10/8-10/10).  No prior to admission anticoagulation noted.   Pharmacy consulted to dose IV heparin and warfarin for long-term anticoagulation. D-dimer 2.43 (10/24). Baseline coags:  INR 1.1, PTT 27.  S/p TPA 50mg  for PE - infused 10/24 1400-1603  Today, 06/25/2021: Warfarin bridge with heparin started 10/25 (day 2)  HL 0.53 - therapeutic on IV Heparin infusion at 2500 units/hr  INR remains subtherapeutic at 1.1 Hgb decreased to 12.8, Plts 212K No bleeding or infusion related issues reported by RN  Goal of Therapy:  Heparin  level 0.3-0.7  INR 2-3 Monitor platelets by anticoagulation protocol: Yes   Plan: - Continue heparin infusion to 2500 units/hr - Warfarin 10 mg at 1600 - Daily heparin level, PT/INR, and CBC while on anticoagulation - Patient will need minimum of 5 days warfarin/parental overlap and until INR >2 for 24 hours) - Monitor closely for signs/symptoms of bleeding  Asencion Gowda PharmD/MBA Candidate [employer], Class of 2023   I have reviewed and agree with the assessment and plan as stated.  Lynann Beaver PharmD, BCPS Clinical Pharmacist [facility] main pharmacy [phone] 06/25/2021 9:56 AM

## 2021-06-26 DIAGNOSIS — J9601 Acute respiratory failure with hypoxia: Secondary | ICD-10-CM

## 2021-06-26 DIAGNOSIS — I2609 Other pulmonary embolism with acute cor pulmonale: Secondary | ICD-10-CM

## 2021-06-26 LAB — CBC WITH DIFFERENTIAL/PLATELET
Abs Immature Granulocytes: 0.02 10*3/uL (ref 0.00–0.07)
Basophils Absolute: 0 10*3/uL (ref 0.0–0.1)
Basophils Relative: 1 %
Eosinophils Absolute: 0.4 10*3/uL (ref 0.0–0.5)
Eosinophils Relative: 5 %
HCT: 36.4 % — ABNORMAL LOW (ref 39.0–52.0)
Hemoglobin: 11.7 g/dL — ABNORMAL LOW (ref 13.0–17.0)
Immature Granulocytes: 0 %
Lymphocytes Relative: 28 %
Lymphs Abs: 2.3 10*3/uL (ref 0.7–4.0)
MCH: 26.7 pg (ref 26.0–34.0)
MCHC: 32.1 g/dL (ref 30.0–36.0)
MCV: 82.9 fL (ref 80.0–100.0)
Monocytes Absolute: 1 10*3/uL (ref 0.1–1.0)
Monocytes Relative: 13 %
Neutro Abs: 4.4 10*3/uL (ref 1.7–7.7)
Neutrophils Relative %: 53 %
Platelets: 198 10*3/uL (ref 150–400)
RBC: 4.39 MIL/uL (ref 4.22–5.81)
RDW: 14.6 % (ref 11.5–15.5)
WBC: 8.2 10*3/uL (ref 4.0–10.5)
nRBC: 0 % (ref 0.0–0.2)

## 2021-06-26 LAB — BASIC METABOLIC PANEL
Anion gap: 10 (ref 5–15)
BUN: 24 mg/dL — ABNORMAL HIGH (ref 6–20)
CO2: 22 mmol/L (ref 22–32)
Calcium: 8.8 mg/dL — ABNORMAL LOW (ref 8.9–10.3)
Chloride: 104 mmol/L (ref 98–111)
Creatinine, Ser: 1.35 mg/dL — ABNORMAL HIGH (ref 0.61–1.24)
GFR, Estimated: >60 mL/min (ref >60)
Glucose, Bld: 208 mg/dL — ABNORMAL HIGH (ref 70–99)
Potassium: 3.5 mmol/L (ref 3.5–5.1)
Sodium: 136 mmol/L (ref 135–145)

## 2021-06-26 LAB — GLUCOSE, CAPILLARY
Glucose-Capillary: 199 mg/dL — ABNORMAL HIGH (ref 70–99)
Glucose-Capillary: 259 mg/dL — ABNORMAL HIGH (ref 70–99)
Glucose-Capillary: 230 mg/dL — ABNORMAL HIGH (ref 70–99)
Glucose-Capillary: 272 mg/dL — ABNORMAL HIGH (ref 70–99)

## 2021-06-26 LAB — PROTIME-INR
INR: 1.1 (ref 0.8–1.2)
Prothrombin Time: 14.6 seconds (ref 11.4–15.2)

## 2021-06-26 LAB — MAGNESIUM: Magnesium: 1.9 mg/dL (ref 1.7–2.4)

## 2021-06-26 LAB — HEPARIN LEVEL (UNFRACTIONATED): Heparin Unfractionated: 0.45 IU/mL (ref 0.30–0.70)

## 2021-06-26 MED ORDER — WARFARIN SODIUM 5 MG PO TABS
15.0000 mg | ORAL_TABLET | Freq: Once | ORAL | Status: AC
  Administered 2021-06-26: 15 mg via ORAL
  Filled 2021-06-26: qty 3

## 2021-06-26 MED ORDER — INSULIN DETEMIR 100 UNIT/ML ~~LOC~~ SOLN
18.0000 [IU] | Freq: Every day | SUBCUTANEOUS | Status: DC
  Administered 2021-06-26 – 2021-06-27 (×2): 18 [IU] via SUBCUTANEOUS
  Filled 2021-06-26 (×3): qty 0.18

## 2021-06-26 NOTE — Progress Notes (Signed)
Progress Note    William Moss   KVQ:[id number]  DOB: [birth year]  DOA: 06/23/2021     3 Date of Service: 06/26/2021   Clinical Course William Moss is a 35 year old male with PMH obesity, HTN, DMII who presented to the hospital with worsening shortness of breath at home. He was recently hospitalized from 06/07/2021 until 06/09/2021 for community-acquired pneumonia.  He required oxygen during that hospitalization but was able to be weaned off prior to discharge.  After discharge he had been less active and less mobile. On admission, he underwent CT angio chest which showed bilateral pulmonary emboli with evidence of right heart straining on CTA.  Echo was difficult for interpretation and could not elaborate on if presence of RV strain. He was evaluated by IR and not considered a candidate for thrombectomy and therefore was started on tPA per pulmonology.  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  Does have a known family history of PE in his dad - outpatient hypercoag workup recommened per pulm - s/p TPA on 10/24 - currently on heparin; Coumadin to start 10/25 with heparin bridge; would suspect could also bridge with Lovenox if approaching discharge time however still on considerable amount of O2 at this time - LE duplex: Positive for DVT in Left femoral and popliteal vein  Acute respiratory failure with hypoxia (HCC) - Considered due to bilateral PE -Wean oxygen as able.  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  Has worsened since August 2022 (previously 7.4%) - Continue Semglee; will adjust as needed - Continue  SSI and CBG monitoring  Elevated troponin - Suspected due to demand from PE  Hypomagnesemia - repleted as needed  Morbid obesity with BMI of 60.0-69.9, adult (HCC) - Complicates overall prognosis and care - Body mass index is 66.75 kg/m.  Essential hypertension - Continue amlodipine, Coreg, hydralazine -Use labetalol or hydralazine as needed   Subjective:  No events overnight.  Breathing is a little more comfortable this morning.  No acute concerns this morning.  Understands plan is for ongoing oxygen weaning.  Objective Vitals:   06/26/21 0400 06/26/21 0751 06/26/21 0800 06/26/21 0928  BP: 132/82 135/67 (!) 143/73   Pulse: 80 86 85 96  Resp: (!) 34 16 (!) 25 (!) 27  Temp: 99.1 F (37.3 C) 98.7 F (37.1 C)    TempSrc: Axillary Oral    SpO2: 94% 92% (!) 89% (!) 88%  Weight:      Height:       (!) 205 kg  Vital signs were reviewed and unremarkable.   Exam Physical Exam Constitutional:      General: He is not in acute distress.    Appearance: He is well-developed. He is obese. He is not ill-appearing.  HENT:     Head: Normocephalic and atraumatic.     Mouth/Throat:     Mouth: Mucous membranes are moist.  Eyes:     Extraocular Movements: Extraocular movements intact.  Cardiovascular:     Rate and Rhythm: Normal rate and regular rhythm.  Pulmonary:     Effort: Pulmonary effort is normal. No respiratory distress.     Breath sounds: Normal breath sounds.  Abdominal:     General: Bowel sounds are normal.     Palpations: Abdomen is soft.     Tenderness: There is no abdominal tenderness.  Musculoskeletal:        General: Normal range of motion.     Cervical back: Normal range of motion and neck supple.  Skin:    General: Skin is warm and dry.  Neurological:     General: No focal deficit present.     Mental Status: He is alert.  Psychiatric:        Mood and Affect: Mood normal.        Behavior: Behavior normal.     Labs / Other Information My review of  labs, imaging, notes and other tests shows no new significant findings.    Disposition Plan: Status is: Inpatient  Remains inpatient appropriate because: treatment of above     Time spent: Greater than 50% of the 35 minute visit was spent in counseling/coordination of care for the patient as laid out in the A&P.  [name], MD [facility] 06/26/2021, 12:43 PM

## 2021-06-26 NOTE — Evaluation (Signed)
Occupational Therapy Evaluation Patient Details Name: William Moss MRN: [id number] DOB: [birth year] Today's Date: 06/26/2021   History of Present Illness William Moss is a 35 year old male with PMH obesity, HTN, DMII who presented to the hospital with worsening shortness of breath at home.  He was recently hospitalized from 06/07/2021 until 06/09/2021 for community-acquired pneumonia.  He required oxygen during that hospitalization but was able to be weaned off prior to discharge.  After discharge he had been less active and less mobile.  On admission, he underwent CT angio chest which showed bilateral pulmonary emboli with evidence of right heart straining on CTA.  Echo was difficult for interpretation and could not elaborate on if presence of RV strain.  He was evaluated by IR and not considered a candidate for thrombectomy and therefore was started on tPA per pulmonology.   Clinical Impression   PTA, pt was living at home with his roommate, working at [employer], and independent in ADLs/IADLs. Upon evaluation, pt supervision level for all ADLs and functional mobility, only needed assistance for line management while taking 3 steps to sit in recliner with Boykin Nearing RW. Pt with decreased cardiopulmonary function, SpO2 88% on arrival on 8L, RN increased to 10 L in preparation for mobility. SpO2 increasing to 92% on 10L. Pt performed transfer to recliner and marched in place for 30 seconds, SpO2 dropping to 86%. Pt educated on pursed lip breathing and encouraged to breath, SpO2 increasing to 93%. Due to current level of function, recommending no OT follow up and intermittent supervision post-d/c. Deferring balance deficits to PT. No acute OT needs.     Recommendations for follow up therapy are one component of a multi-disciplinary discharge planning process, led by the attending physician.  Recommendations may be updated based on patient status, additional functional criteria and insurance authorization.   Follow Up  Recommendations  No OT follow up    Assistance Recommended at Discharge Intermittent Supervision/Assistance  Functional Status Assessment  Patient has had a recent decline in their functional status and demonstrates the ability to make significant improvements in function in a reasonable and predictable amount of time.  Equipment Recommendations  None recommended by OT    Recommendations for Other Services       Precautions / Restrictions Precautions Precautions: Fall Precaution Comments: On 8L O2 at rest, 10 for mobility Restrictions Weight Bearing Restrictions: No      Mobility Bed Mobility Overal bed mobility: Needs Assistance Bed Mobility: Supine to Sit     Supine to sit: Modified independent (Device/Increase time)     General bed mobility comments: HOB elevated    Transfers Overall transfer level: Needs assistance Equipment used: Rolling walker (2 wheels) Transfers: Stand Pivot Transfers;Sit to/from Stand Sit to Stand: Supervision Stand pivot transfers: Supervision         General transfer comment: For safety, monitored SpO2      Balance Overall balance assessment: Needs assistance Sitting-balance support: Bilateral upper extremity supported;Feet supported Sitting balance-Leahy Scale: Good     Standing balance support: During functional activity;Bilateral upper extremity supported Standing balance-Leahy Scale: Fair Standing balance comment: reliant on support of UE's                           ADL either performed or assessed with clinical judgement   ADL Overall ADL's : Needs assistance/impaired  General ADL Comments: Pt supervision for all ADLs and functional mobility at this time, only needing assist for line management     Vision Patient Visual Report: No change from baseline       Perception     Praxis      Pertinent Vitals/Pain Pain Assessment: No/denies pain      Hand Dominance Right   Extremity/Trunk Assessment Upper Extremity Assessment Upper Extremity Assessment: Overall WFL for tasks assessed       Cervical / Trunk Assessment Cervical / Trunk Assessment: Normal   Communication Communication Communication: No difficulties   Cognition Arousal/Alertness: Awake/alert Behavior During Therapy: WFL for tasks assessed/performed Overall Cognitive Status: Within Functional Limits for tasks assessed                                       General Comments     Exercises     Shoulder Instructions      Home Living Family/patient expects to be discharged to:: Private residence Living Arrangements: Non-relatives/Friends Available Help at Discharge: Friend(s) Type of Home: House Home Access: Stairs to enter     Home Layout: One level     Bathroom Shower/Tub: Dietitian: None          Prior Functioning/Environment Prior Level of Function : Independent/Modified Independent             Mobility Comments: ambulated without an assistive device ADLs Comments: Independent in ADLs/IADLs        OT Problem List: Cardiopulmonary status limiting activity      OT Treatment/Interventions:      OT Goals(Current goals can be found in the care plan section)    OT Frequency:     Barriers to D/C:            Co-evaluation              AM-PAC OT "6 Clicks" Daily Activity     Outcome Measure Help from another person eating meals?: None Help from another person taking care of personal grooming?: A Little Help from another person toileting, which includes using toliet, bedpan, or urinal?: A Little Help from another person bathing (including washing, rinsing, drying)?: A Little Help from another person to put on and taking off regular upper body clothing?: A Little Help from another person to put on and taking off regular lower body clothing?: A Little 6 Click Score: 19    End of Session Equipment Utilized During Treatment: Rolling walker (2 wheels);Oxygen Nurse Communication: Mobility status;Other (comment) (SpO2)  Activity Tolerance: Patient tolerated treatment well Patient left: with call bell/phone within reach;with chair alarm set;in chair  OT Visit Diagnosis: Unsteadiness on feet (R26.81)                Time: 1751-0258 OT Time Calculation (min): 22 min Charges:  OT General Charges $OT Visit: 1 Visit OT Evaluation $OT Eval Low Complexity: 1 Low  Prudence Davidson, OTS Acute Rehab Office: [phone]   Keajah Killough 06/26/2021, 3:08 PM

## 2021-06-26 NOTE — Progress Notes (Addendum)
NAME:  PAOLO OKANE, MRN:  [id number], DOB:  Jan 02, 1986, LOS: 3 ADMISSION DATE:  06/23/2021, CONSULTATION DATE:  10/24 REFERRING MD:  Meryle Ready, CHIEF COMPLAINT:  submassive PE, hypoxic respiratory failure  History of Present Illness:  Mr. Wien is a 35 year old gentleman with a history of recent pneumonia who presented with sudden onset shortness of breath and chest tightness 10/23.  He had some dizziness before presentation. Since admission he is feeling mildly improved on BiPAP.  He was hospitalized recently with community-acquired pneumonia, but was discharged from the hospital not on home oxygen.  He has a family history of PE in his father, which was fatal.  No previous personal history of clots.  He has not had any bleeding, no history of stroke or CNS surgery.  No previous brain hemorrhage.   Upon presentation to the ED he had saturations in the high 40s.  CT demonstrated bilateral lower lobe PEs.  CT demonstrated dilated RV and RA.  He was started empirically on IV heparin.  He has been hypertensive since admission.  Pertinent  Medical History  Recent CAP- hospitalized  Significant Hospital Events: Including procedures, antibiotic start and stop dates in addition to other pertinent events   10/23 admitted 10/24 50mg  peripheral TPA 10/26 BiPAP overnight, HFNC during day.  No hemoptysis  Interim History / Subjective:  Tmax 99.1  BiPAP overnight, on 10L salter  Denies chest pain, SOB INR 1.1, heparin 0.45   Objective   Blood pressure 135/67, pulse 86, temperature 99.1 F (37.3 C), temperature source Axillary, resp. rate 16, height 5\' 9"  (1.753 m), weight (!) 205 kg, SpO2 92 %.    FiO2 (%):  [60 %] 60 %   Intake/Output Summary (Last 24 hours) at 06/26/2021 0857 Last data filed at 06/25/2021 1600 Gross per 24 hour  Intake 659.8 ml  Output 300 ml  Net 359.8 ml   Filed Weights   06/23/21 0023 06/23/21 0500  Weight: (!) 172.4 kg (!) 205 kg    Examination: General:  adult male lying in bed in NAD HEENT: MM pink/moist, good dentition, anicteric  Neuro: AAOx4, MAE  CV: s1s2 RRR, no appreciable m/r/g PULM:  non-labored at rest, 10L salter, lungs bilaterally distant GI: soft, bsx4 active  Extremities: warm/dry, no edema  Skin: no rashes or lesions  Resolved Hospital Problem list     Assessment & Plan:   Acute Respiratory Failure with Hypoxia Submassive PE  Likely provoked PE with recent hospitalization.  However, does have family history in 1st degree relative. RV dilation on CT; echo unable to assess RV function and size. Had rising troponin. BNP likely artificially low due to BMI. PESI class II, but I feel that based on ongoing CP and SOB with profound hypoxia, he is likely higher risk than what his risk score would predict, especially with baseline severe obesity that could limit his cardiopulmonary reserve.  Improved symptoms after tPA.   -wean O2 for sats >90% -BiPAP QHS, PRN day time sleeping -tele monitoring  -warfarin initiated 10/25, will need 5 days therapeutic overlap.  Unfortunately, DOAC's not ideal option given BMI and limited data in this population   -will need warfarin clinic follow up at discharge  -will need 3-6 months anticoagulation at minimum.  Given family history, would assess hypercoagulability panel once off anticoagulation.  Consideration for lifelong anticoagulation not unreasonable given family hx.  -follow up ECHO not likely to be of use given limitations due to body habitus -bleeding risks / caution reinforced -mobilize as  able -trend Hgb while on anticoagulation   Hypertension -per primary, on norvasc, coreg, hydralazine  AKI on CKD -per primary   Obesity -long term weight loss important for overall health and life expectancy   DM II with Hyperglycemia Hgb A1c 10.8.  Uncontrolled despite minimal PO intake. Suspect D5w in heparin and stress response explain this. -glucose control per primary   Best Practice  (right click and "Reselect all SmartList Selections" daily)  Diet/type: Regular consistency (see orders) DVT prophylaxis: systemic heparin + warfarin GI prophylaxis: N/A Lines: yes and it is still needed Foley:  N/A Code Status:  full code Last date of multidisciplinary goals of care discussion: per primary        Canary Brim, MSN, APRN, NP-C, AGACNP-BC  Pulmonary & Critical Care 06/26/2021, 8:57 AM   Please see [email or web address] for pager details.   From 7A-7P if no response, please call [phone] After hours, please call ELink [phone]

## 2021-06-26 NOTE — Progress Notes (Addendum)
ANTICOAGULATION CONSULT NOTE - Follow Up Consult  Pharmacy Consult for Heparin, Warfarin Indication: pulmonary embolus and DVT  Allergies  Allergen Reactions   Lisinopril-Hydrochlorothiazide Other (See Comments)    "Interfered with the pH level in the serum"    Patient Measurements: Height: 5\' 9"  (175.3 cm) Weight: (!) 205 kg (452 lb) IBW/kg (Calculated) : 70.7 Heparin Dosing Weight: 113.6 kg  Vital Signs: Temp: 99.1 F (37.3 C) (10/27 0400) Temp Source: Axillary (10/27 0400) BP: 135/67 (10/27 0751) Pulse Rate: 86 (10/27 0751)  Labs: Recent Labs    06/23/21 0953 06/23/21 1338 06/23/21 1338 06/23/21 1635 06/23/21 2012 06/23/21 2357 06/24/21 0341 06/24/21 0909 06/24/21 1530 06/25/21 0430 06/26/21 0704 06/26/21 0705  HGB  --   --   --  12.2*  --   --    < > 13.1  --  12.8* 11.7*  --   HCT  --   --   --  37.2*  --   --    < > 40.9  --  39.8 36.4*  --   PLT  --   --    < > 206  --   --   --  194  --  212 198  --   APTT  --  34  --  30  --  47*  --   --   --   --   --   --   LABPROT  --  14.8   < > 15.8*  --   --   --   --   --  14.3 14.6  --   INR  --  1.2   < > 1.3*  --   --   --   --   --  1.1 1.1  --   HEPARINUNFRC 0.21*  --   --  <0.10*  --  0.19*  --  0.37 0.42 0.53  --  0.45  CREATININE  --   --   --   --    < >  --   --  1.53*  --  1.56* 1.35*  --   TROPONINIHS 258*  --   --   --   --   --   --   --   --   --   --   --    < > = values in this interval not displayed.     Estimated Creatinine Clearance: 131.8 mL/min (A) (by C-G formula based on SCr of 1.35 mg/dL (H)).   Medications:  Infusions:   sodium chloride 10 mL/hr at 06/25/21 1200   heparin 2,500 Units/hr (06/26/21 0152)    Assessment: 34 yoM admitted on 10/24 with SOB and chest pain, found to have bilateral PE with right heart strain.  He was recently admitted for pneumonia (10/8-10/10).  No prior to admission anticoagulation noted.  Pharmacy consulted to dose IV heparin and warfarin for long-term  anticoagulation. D-dimer 2.43 (10/24). Baseline coags:  INR 1.1, PTT 27.  S/p TPA 50mg  for PE - infused 10/24 1400-1603  Today, 06/26/2021: Warfarin bridge with heparin started 10/25 (day 3)  HL 0.45 - therapeutic on IV Heparin infusion at 2500 units/hr  INR remains subtherapeutic at 1.1 Hgb decreased to 11.7, Plts 198K No bleeding or infusion related issues reported by RN  Goal of Therapy:  Heparin level 0.3-0.7  INR 2-3 Monitor platelets by anticoagulation protocol: Yes   Plan: - Continue heparin infusion to 2500 units/hr - Warfarin 15 mg at 1600 -  Daily heparin level, PT/INR, and CBC while on anticoagulation - Patient will need minimum of 5 days warfarin/parental overlap and until INR >2 for 24 hours) - Monitor closely for signs/symptoms of bleeding  Asencion Gowda PharmD/MBA [address] Class of 2023

## 2021-06-26 NOTE — Progress Notes (Signed)
Inpatient Diabetes Program Recommendations  AACE/ADA: New Consensus Statement on Inpatient Glycemic Control (2015)  Target Ranges:  Prepandial:   less than 140 mg/dL      Peak postprandial:   less than 180 mg/dL (1-2 hours)      Critically ill patients:  140 - 180 mg/dL   Lab Results  Component Value Date   GLUCAP 199 (H) 06/26/2021   HGBA1C 10.8 (H) 06/24/2021   Results for William Moss, William Moss (MRN [id number]) as of 06/26/2021 09:48  Ref. Range 06/25/2021 07:38 06/25/2021 11:24 06/25/2021 17:01 06/25/2021 21:29 06/26/2021 08:23  Glucose-Capillary Latest Ref Range: 70 - 99 mg/dL 785 (H) 885 (H) 027 (H) 268 (H) 199 (H)   Diabetes history: DM 2 Outpatient Diabetes medications: Levemir 15 units, Metformin 1000 mg bid Current orders for Inpatient glycemic control:  Levemir 10 units Novolog 0-15 units tid + hs   A1c 7.4% on 8/18 Now 10.8 on 06/24/21   Inpatient Diabetes Program Recommendations:     - Add Novolog 4 units tid meal coverage if eating >50% of meals   Thanks,  Christena Deem RN, MSN, BC-ADM Inpatient Diabetes Coordinator Team Pager [phone] (8a-5p)

## 2021-06-27 LAB — CBC WITH DIFFERENTIAL/PLATELET
Abs Immature Granulocytes: 0.05 10*3/uL (ref 0.00–0.07)
Basophils Absolute: 0 10*3/uL (ref 0.0–0.1)
Basophils Relative: 0 %
Eosinophils Absolute: 0.4 10*3/uL (ref 0.0–0.5)
Eosinophils Relative: 6 %
HCT: 36.2 % — ABNORMAL LOW (ref 39.0–52.0)
Hemoglobin: 11.7 g/dL — ABNORMAL LOW (ref 13.0–17.0)
Immature Granulocytes: 1 %
Lymphocytes Relative: 34 %
Lymphs Abs: 2.5 10*3/uL (ref 0.7–4.0)
MCH: 26.8 pg (ref 26.0–34.0)
MCHC: 32.3 g/dL (ref 30.0–36.0)
MCV: 82.8 fL (ref 80.0–100.0)
Monocytes Absolute: 0.9 10*3/uL (ref 0.1–1.0)
Monocytes Relative: 12 %
Neutro Abs: 3.5 10*3/uL (ref 1.7–7.7)
Neutrophils Relative %: 47 %
Platelets: 201 10*3/uL (ref 150–400)
RBC: 4.37 MIL/uL (ref 4.22–5.81)
RDW: 14.3 % (ref 11.5–15.5)
WBC: 7.3 10*3/uL (ref 4.0–10.5)
nRBC: 0 % (ref 0.0–0.2)

## 2021-06-27 LAB — BASIC METABOLIC PANEL
Anion gap: 11 (ref 5–15)
BUN: 25 mg/dL — ABNORMAL HIGH (ref 6–20)
CO2: 21 mmol/L — ABNORMAL LOW (ref 22–32)
Calcium: 8.7 mg/dL — ABNORMAL LOW (ref 8.9–10.3)
Chloride: 103 mmol/L (ref 98–111)
Creatinine, Ser: 1.43 mg/dL — ABNORMAL HIGH (ref 0.61–1.24)
GFR, Estimated: >60 mL/min (ref >60)
Glucose, Bld: 209 mg/dL — ABNORMAL HIGH (ref 70–99)
Potassium: 3.4 mmol/L — ABNORMAL LOW (ref 3.5–5.1)
Sodium: 135 mmol/L (ref 135–145)

## 2021-06-27 LAB — GLUCOSE, CAPILLARY
Glucose-Capillary: 200 mg/dL — ABNORMAL HIGH (ref 70–99)
Glucose-Capillary: 345 mg/dL — ABNORMAL HIGH (ref 70–99)
Glucose-Capillary: 377 mg/dL — ABNORMAL HIGH (ref 70–99)
Glucose-Capillary: 291 mg/dL — ABNORMAL HIGH (ref 70–99)

## 2021-06-27 LAB — PROTIME-INR
INR: 1.3 — ABNORMAL HIGH (ref 0.8–1.2)
Prothrombin Time: 16.2 seconds — ABNORMAL HIGH (ref 11.4–15.2)

## 2021-06-27 LAB — MAGNESIUM: Magnesium: 1.9 mg/dL (ref 1.7–2.4)

## 2021-06-27 LAB — HEPARIN LEVEL (UNFRACTIONATED): Heparin Unfractionated: 0.62 IU/mL (ref 0.30–0.70)

## 2021-06-27 MED ORDER — POTASSIUM CHLORIDE CRYS ER 20 MEQ PO TBCR
40.0000 meq | EXTENDED_RELEASE_TABLET | Freq: Once | ORAL | Status: AC
  Administered 2021-06-27: 40 meq via ORAL
  Filled 2021-06-27: qty 2

## 2021-06-27 MED ORDER — WARFARIN SODIUM 5 MG PO TABS
15.0000 mg | ORAL_TABLET | Freq: Once | ORAL | Status: AC
  Administered 2021-06-27: 15 mg via ORAL
  Filled 2021-06-27: qty 3

## 2021-06-27 MED ORDER — HEPARIN (PORCINE) 25000 UT/250ML-% IV SOLN
2450.0000 [IU]/h | INTRAVENOUS | Status: DC
  Administered 2021-06-27 – 2021-06-29 (×5): 2450 [IU]/h via INTRAVENOUS
  Filled 2021-06-27 (×5): qty 250

## 2021-06-27 MED ORDER — COUMADIN BOOK
Freq: Once | Status: AC
  Filled 2021-06-27: qty 1

## 2021-06-27 NOTE — Progress Notes (Signed)
Inpatient Diabetes Program Recommendations  AACE/ADA: New Consensus Statement on Inpatient Glycemic Control (2015)  Target Ranges:  Prepandial:   less than 140 mg/dL      Peak postprandial:   less than 180 mg/dL (1-2 hours)      Critically ill patients:  140 - 180 mg/dL  Results for William Moss, William Moss (MRN [id number]) as of 06/27/2021 08:04  Ref. Range 06/26/2021 08:23 06/26/2021 13:22 06/26/2021 17:21 06/26/2021 19:48  Glucose-Capillary Latest Ref Range: 70 - 99 mg/dL 491 (H)  3 units Novolog  18 units Levemir  259 (H)  8 units Novolog  230 (H)  5 units Novolog  272 (H)  2 units Novolog   Results for [name], [name] (MRN [id number]) as of 06/27/2021 08:04  Ref. Range 06/27/2021 07:49  Glucose-Capillary Latest Ref Range: 70 - 99 mg/dL 948 (H)     Home DM Meds: Levemir 15 units     Metformin 1000 mg bid  Current Orders: Levemir 18 units daily     Novolog Moderate Correction Scale/ SSI (0-15 units) TID AC + HS    MD- Note AM CBG elevated to 200 this AM (was 199 yest AM).  Also note afternoon CBGs >200 yesterday.  Please consider:  1. Increase Levemir to 20 units Daily   2. Start Novolog Meal Coverage: Novolog 4 units TID with meals Hold if pt eats <50% of meal, Hold if pt NPO   --Will follow patient during hospitalization--  Ambrose Finland RN, MSN, CDE Diabetes Coordinator Inpatient Glycemic Control Team Team Pager: [phone] (8a-5p)

## 2021-06-27 NOTE — Discharge Instructions (Signed)
Information on my medicine - Coumadin   (Warfarin)  This medication education was reviewed with me or my healthcare representative as part of my discharge preparation.  The pharmacist that spoke with me during my hospital stay was:  Rollene Fare, [facility]  Why was Coumadin prescribed for you? Coumadin was prescribed for you because you have a blood clot or a medical condition that can cause an increased risk of forming blood clots. Blood clots can cause serious health problems by blocking the flow of blood to the heart, lung, or brain. Coumadin can prevent harmful blood clots from forming. As a reminder your indication for Coumadin is:  Pulmonary Embolism treatment and DVT treatment  What test will check on my response to Coumadin? While on Coumadin (warfarin) you will need to have an INR test regularly to ensure that your dose is keeping you in the desired range. The INR (international normalized ratio) number is calculated from the result of the laboratory test called prothrombin time (PT).  If an INR APPOINTMENT HAS NOT ALREADY BEEN MADE FOR YOU please schedule an appointment to have this lab work done by your health care provider within 7 days. Your INR goal is usually a number between:  2 to 3 or your provider may give you a more narrow range like 2-2.5.  Ask your health care provider during an office visit what your goal INR is.  What  do you need to  know  About  COUMADIN? Take Coumadin (warfarin) exactly as prescribed by your healthcare provider about the same time each day.  DO NOT stop taking without talking to the doctor who prescribed the medication.  Stopping without other blood clot prevention medication to take the place of Coumadin may increase your risk of developing a new clot or stroke.  Get refills before you run out.  What do you do if you miss a dose? If you miss a dose, take it as soon as you remember on the same day then continue your regularly scheduled regimen the next  day.  Do not take two doses of Coumadin at the same time.  Important Safety Information A possible side effect of Coumadin (Warfarin) is an increased risk of bleeding. You should call your healthcare provider right away if you experience any of the following: Bleeding from an injury or your nose that does not stop. Unusual colored urine (red or dark brown) or unusual colored stools (red or black). Unusual bruising for unknown reasons. A serious fall or if you hit your head (even if there is no bleeding).  Some foods or medicines interact with Coumadin (warfarin) and might alter your response to warfarin. To help avoid this: Eat a balanced diet, maintaining a consistent amount of Vitamin K. Notify your provider about major diet changes you plan to make. Avoid alcohol or limit your intake to 1 drink for women and 2 drinks for men per day. (1 drink is 5 oz. wine, 12 oz. beer, or 1.5 oz. liquor.)  Make sure that ANY health care provider who prescribes medication for you knows that you are taking Coumadin (warfarin).  Also make sure the healthcare provider who is monitoring your Coumadin knows when you have started a new medication including herbals and non-prescription products.  Coumadin (Warfarin)  Major Drug Interactions  Increased Warfarin Effect Decreased Warfarin Effect  Alcohol (large quantities) Antibiotics (esp. Septra/Bactrim, Flagyl, Cipro) Amiodarone (Cordarone) Aspirin (ASA) Cimetidine (Tagamet) Megestrol (Megace) NSAIDs (ibuprofen, naproxen, etc.) Piroxicam (Feldene) Propafenone (Rythmol SR) Propranolol (  Inderal) Isoniazid (INH) Posaconazole (Noxafil) Barbiturates (Phenobarbital) Carbamazepine (Tegretol) Chlordiazepoxide (Librium) Cholestyramine (Questran) Griseofulvin Oral Contraceptives Rifampin Sucralfate (Carafate) Vitamin K   Coumadin (Warfarin) Major Herbal Interactions  Increased Warfarin Effect Decreased Warfarin Effect  Garlic Ginseng Ginkgo biloba  Coenzyme Q10 Green tea St. John's wort    Coumadin (Warfarin) FOOD Interactions  Eat a consistent number of servings per week of foods HIGH in Vitamin K (1 serving =  cup)  Collards (cooked, or boiled & drained) Kale (cooked, or boiled & drained) Mustard greens (cooked, or boiled & drained) Parsley *serving size only =  cup Spinach (cooked, or boiled & drained) Swiss chard (cooked, or boiled & drained) Turnip greens (cooked, or boiled & drained)  Eat a consistent number of servings per week of foods MEDIUM-HIGH in Vitamin K (1 serving = 1 cup)  Asparagus (cooked, or boiled & drained) Broccoli (cooked, boiled & drained, or raw & chopped) Brussel sprouts (cooked, or boiled & drained) *serving size only =  cup Lettuce, raw (green leaf, endive, romaine) Spinach, raw Turnip greens, raw & chopped   These websites have more information on Coumadin (warfarin):  http://www.king-russell.com/; https://www.hines.net/;

## 2021-06-27 NOTE — Progress Notes (Addendum)
ANTICOAGULATION CONSULT NOTE - Follow Up Consult  Pharmacy Consult for Heparin, Warfarin Indication: pulmonary embolus and DVT  Allergies  Allergen Reactions   Lisinopril-Hydrochlorothiazide Other (See Comments)    "Interfered with the pH level in the serum"    Patient Measurements: Height: 5\' 9"  (175.3 cm) Weight: (!) 197.8 kg (436 lb) IBW/kg (Calculated) : 70.7 Heparin Dosing Weight: 113.6 kg  Vital Signs: Temp: 97.8 F (36.6 C) (10/28 0400) Temp Source: Axillary (10/28 0400) BP: 145/80 (10/28 0700) Pulse Rate: 72 (10/28 0700)  Labs: Recent Labs    06/25/21 0430 06/26/21 0704 06/26/21 0705 06/27/21 0456  HGB 12.8* 11.7*  --  11.7*  HCT 39.8 36.4*  --  36.2*  PLT 212 198  --  201  LABPROT 14.3 14.6  --  16.2*  INR 1.1 1.1  --  1.3*  HEPARINUNFRC 0.53  --  0.45 0.62  CREATININE 1.56* 1.35*  --  1.43*     Estimated Creatinine Clearance: 121.5 mL/min (A) (by C-G formula based on SCr of 1.43 mg/dL (H)).   Medications:  Infusions:   sodium chloride Stopped (06/25/21 1918)   heparin 2,500 Units/hr (06/27/21 0600)    Assessment: 34 yoM admitted on 10/24 with SOB and chest pain, found to have bilateral PE with right heart strain.  He was recently admitted for pneumonia (10/8-10/10).  No prior to admission anticoagulation noted.  Pharmacy consulted to dose IV heparin and warfarin for long-term anticoagulation. D-dimer 2.43 (10/24). Baseline coags:  INR 1.1, PTT 27.  S/p TPA 50mg  for PE - infused 10/24 1400-1603  Today, 06/27/2021: Warfarin bridge with heparin started 10/25 (day 4)  HL 0.62 - therapeutic on IV Heparin infusion at 2500 units/hr  INR remains subtherapeutic at 1.3 Hgb stable at 11.7, Plts improved to 201K No bleeding or infusion related issues reported by RN  Goal of Therapy:  Heparin level 0.3-0.7  INR 2-3 Monitor platelets by anticoagulation protocol: Yes   Plan: - Decrease heparin infusion to 2450 units/hr  - HL therapeutic but trending  towards the upper end of range - Warfarin 15 mg at 1600 - Daily heparin level, PT/INR, and CBC while on anticoagulation - Patient will need minimum of 5 days warfarin/parental overlap and until INR >2 for 24 hours) - Monitor closely for signs/symptoms of bleeding  06/29/2021 PharmD/MBA Candidate [id number], Class of 2023

## 2021-06-27 NOTE — Progress Notes (Signed)
Progress Note    BRADLEY BOSTELMAN   TIR:[id number]  DOB: June 26, 1986  DOA: 06/23/2021     4 Date of Service: 06/27/2021   Clinical Course Mr. Joos is a 35 year old male with PMH obesity, HTN, DMII who presented to the hospital with worsening shortness of breath at home. He was recently hospitalized from 06/07/2021 until 06/09/2021 for community-acquired pneumonia.  He required oxygen during that hospitalization but was able to be weaned off prior to discharge.  After discharge he had been less active and less mobile. On admission, he underwent CT angio chest which showed bilateral pulmonary emboli with evidence of right heart straining on CTA.  Echo was difficult for interpretation and could not elaborate on if presence of RV strain. He was evaluated by IR and not considered a candidate for thrombectomy and therefore was started on tPA per pulmonology.  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  Does have a known family history of PE in his dad - outpatient hypercoag workup recommened per pulm - s/p TPA on 10/24 - currently on heparin; Coumadin to start 10/25 with heparin bridge; would suspect could also bridge with Lovenox if approaching discharge but given weight would be large dosing; continue heparin while inpatient  - LE duplex: Positive for DVT in Left femoral and popliteal vein  Acute respiratory failure with hypoxia (HCC) - Considered due to bilateral PE -Wean oxygen as able.  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  Has worsened since August 2022 (previously 7.4%) - Continue Semglee; will adjust as  needed - Continue SSI and CBG monitoring  Elevated troponin - Suspected due to demand from PE  Hypomagnesemia - repleted as needed  Morbid obesity with BMI of 60.0-69.9, adult (HCC) - Complicates overall prognosis and care - Body mass index is 66.75 kg/m.  Essential hypertension - Continue amlodipine, Coreg, hydralazine -Use labetalol or hydralazine as needed   Subjective:  No events overnight.  Still breathing comfortably.  Okay for transferring out of stepdown today.  Objective Vitals:   06/27/21 0900 06/27/21 1000 06/27/21 1229 06/27/21 1244  BP:   111/61   Pulse: 78 85 82   Resp: 18 (!) 23 (!) 28   Temp:    98.7 F (37.1 C)  TempSrc:    Axillary  SpO2: 97% 99% 90%   Weight:      Height:       (!) 197.8 kg  Vital signs were reviewed and unremarkable.   Exam Physical Exam Constitutional:      General: He is not in acute distress.    Appearance: He is well-developed.  HENT:     Head: Normocephalic and atraumatic.     Mouth/Throat:     Mouth: Mucous membranes are moist.  Eyes:     Extraocular Movements: Extraocular movements intact.  Cardiovascular:     Rate and Rhythm: Normal rate and regular rhythm.  Pulmonary:     Effort: Pulmonary effort is normal.     Breath sounds: Normal breath sounds.  Abdominal:     General: Bowel sounds are normal.     Palpations: Abdomen is soft.     Tenderness: There is no  abdominal tenderness.  Musculoskeletal:        General: Normal range of motion.     Cervical back: Normal range of motion and neck supple.  Skin:    General: Skin is warm and dry.  Neurological:     General: No focal deficit present.     Mental Status: He is alert.  Psychiatric:        Mood and Affect: Mood normal.        Behavior: Behavior normal.     Labs / Other Information My review of labs, imaging, notes and other tests shows no new significant findings.    Disposition Plan: Status is: Inpatient  Remains inpatient appropriate  because: Ongoing treatment of above     Time spent: Greater than 50% of the 35 minute visit was spent in counseling/coordination of care for the patient as laid out in the A&P.  [name], MD [facility] 06/27/2021, 12:46 PM

## 2021-06-28 LAB — CBC WITH DIFFERENTIAL/PLATELET
Abs Immature Granulocytes: 0.05 10*3/uL (ref 0.00–0.07)
Basophils Absolute: 0 10*3/uL (ref 0.0–0.1)
Basophils Relative: 0 %
Eosinophils Absolute: 0.4 10*3/uL (ref 0.0–0.5)
Eosinophils Relative: 5 %
HCT: 34.9 % — ABNORMAL LOW (ref 39.0–52.0)
Hemoglobin: 11 g/dL — ABNORMAL LOW (ref 13.0–17.0)
Immature Granulocytes: 1 %
Lymphocytes Relative: 34 %
Lymphs Abs: 2.3 10*3/uL (ref 0.7–4.0)
MCH: 26.6 pg (ref 26.0–34.0)
MCHC: 31.5 g/dL (ref 30.0–36.0)
MCV: 84.3 fL (ref 80.0–100.0)
Monocytes Absolute: 0.8 10*3/uL (ref 0.1–1.0)
Monocytes Relative: 12 %
Neutro Abs: 3.3 10*3/uL (ref 1.7–7.7)
Neutrophils Relative %: 48 %
Platelets: 193 10*3/uL (ref 150–400)
RBC: 4.14 MIL/uL — ABNORMAL LOW (ref 4.22–5.81)
RDW: 14.3 % (ref 11.5–15.5)
WBC: 6.8 10*3/uL (ref 4.0–10.5)
nRBC: 0 % (ref 0.0–0.2)

## 2021-06-28 LAB — BASIC METABOLIC PANEL
Anion gap: 8 (ref 5–15)
BUN: 27 mg/dL — ABNORMAL HIGH (ref 6–20)
CO2: 21 mmol/L — ABNORMAL LOW (ref 22–32)
Calcium: 8.7 mg/dL — ABNORMAL LOW (ref 8.9–10.3)
Chloride: 105 mmol/L (ref 98–111)
Creatinine, Ser: 1.31 mg/dL — ABNORMAL HIGH (ref 0.61–1.24)
GFR, Estimated: >60 mL/min (ref >60)
Glucose, Bld: 220 mg/dL — ABNORMAL HIGH (ref 70–99)
Potassium: 4 mmol/L (ref 3.5–5.1)
Sodium: 134 mmol/L — ABNORMAL LOW (ref 135–145)

## 2021-06-28 LAB — GLUCOSE, CAPILLARY
Glucose-Capillary: 210 mg/dL — ABNORMAL HIGH (ref 70–99)
Glucose-Capillary: 335 mg/dL — ABNORMAL HIGH (ref 70–99)
Glucose-Capillary: 342 mg/dL — ABNORMAL HIGH (ref 70–99)
Glucose-Capillary: 273 mg/dL — ABNORMAL HIGH (ref 70–99)

## 2021-06-28 LAB — MAGNESIUM: Magnesium: 2.1 mg/dL (ref 1.7–2.4)

## 2021-06-28 LAB — PROTIME-INR
INR: 1.9 — ABNORMAL HIGH (ref 0.8–1.2)
Prothrombin Time: 22 seconds — ABNORMAL HIGH (ref 11.4–15.2)

## 2021-06-28 LAB — HEPARIN LEVEL (UNFRACTIONATED): Heparin Unfractionated: 0.66 IU/mL (ref 0.30–0.70)

## 2021-06-28 MED ORDER — WARFARIN SODIUM 5 MG PO TABS
7.5000 mg | ORAL_TABLET | Freq: Once | ORAL | Status: AC
  Administered 2021-06-28: 7.5 mg via ORAL
  Filled 2021-06-28: qty 1

## 2021-06-28 MED ORDER — INSULIN DETEMIR 100 UNIT/ML ~~LOC~~ SOLN
20.0000 [IU] | Freq: Every day | SUBCUTANEOUS | Status: DC
  Administered 2021-06-28: 20 [IU] via SUBCUTANEOUS
  Filled 2021-06-28 (×2): qty 0.2

## 2021-06-28 NOTE — Progress Notes (Signed)
Physical Therapy Treatment Patient Details Name: William Moss MRN: [id number] DOB: [birth year] Today's Date: 06/28/2021   History of Present Illness Mr. Preis is a 35 year old male with PMH obesity, HTN, DMII who presented to the hospital with worsening shortness of breath at home.  He was recently hospitalized from 06/07/2021 until 06/09/2021 for community-acquired pneumonia.  He required oxygen during that hospitalization but was able to be weaned off prior to discharge.  After discharge he had been less active and less mobile.  On admission, he underwent CT angio chest which showed bilateral pulmonary emboli with evidence of right heart straining on CTA.  Echo was difficult for interpretation and could not elaborate on if presence of RV strain.  He was evaluated by IR and not considered a candidate for thrombectomy and therefore was started on tPA per pulmonology.    PT Comments    Pt seen in ICU currently on 4 lts nasal at 97% at rest with HR 80 and BP 144/83 (104).  General Comments: AxO x 3 very pleasant just recently admitted for PNA.  Works for [employer] [place] remotely at home. Assisted OOB to amb.  Pt self able to rise OOB and only required Supervision to amb the whole unit 500 feet with light lean on Bariatric walker.  General Gait Details: functional alternarting gait with normal gait speed for age.  Remained on 4 lts nasal with sats avg 91% and HR 89.  No true dyspnea or c/o's.  "feeling better".  Left pt in recliner with call light in reach.     Recommendations for follow up therapy are one component of a multi-disciplinary discharge planning process, led by the attending physician.  Recommendations may be updated based on patient status, additional functional criteria and insurance authorization.  Follow Up Recommendations  Home health PT     Assistance Recommended at Discharge Intermittent Supervision/Assistance  Equipment Recommendations  Rolling walker (2 wheels) (Bariatric)     Recommendations for Other Services       Precautions / Restrictions Precautions Precautions: Fall Precaution Comments: monitor sats     Mobility  Bed Mobility Overal bed mobility: Modified Independent       Supine to sit: Modified independent (Device/Increase time)     General bed mobility comments: HOB elevated, use of rails and increased time    Transfers Overall transfer level: Needs assistance Equipment used: Rolling walker (2 wheels) Transfers: Stand Pivot Transfers;Sit to/from Stand Sit to Stand: Supervision Stand pivot transfers: Supervision         General transfer comment: pt self able with supervision for equipement/lines    Ambulation/Gait Ambulation/Gait assistance: Supervision Gait Distance (Feet): 500 Feet Assistive device: Rolling walker (2 wheels) (Bari walker) Gait Pattern/deviations: Step-through pattern Gait velocity: WNL   General Gait Details: functional alternarting gait with normal gait speed for age.  Remained on 4 lts nasal with sats avg 91% and HR 89.  No true dyspnea or c/o's.  "feeling better"   Stairs             Wheelchair Mobility    Modified Rankin (Stroke Patients Only)       Balance                                            Cognition Arousal/Alertness: Awake/alert Behavior During Therapy: WFL for tasks assessed/performed Overall Cognitive Status: Within Functional Limits for tasks  assessed                                 General Comments: AxO x 3 very pleasant just recently admitted for PNA.  Works for [employer] [place] remotely at home.        Exercises      General Comments        Pertinent Vitals/Pain Pain Assessment: No/denies pain    Home Living                          Prior Function            PT Goals (current goals can now be found in the care plan section) Progress towards PT goals: Progressing toward goals    Frequency    Min  3X/week      PT Plan Current plan remains appropriate    Co-evaluation              AM-PAC PT "6 Clicks" Mobility   Outcome Measure  Help needed turning from your back to your side while in a flat bed without using bedrails?: None Help needed moving from lying on your back to sitting on the side of a flat bed without using bedrails?: None Help needed moving to and from a bed to a chair (including a wheelchair)?: None Help needed standing up from a chair using your arms (e.g., wheelchair or bedside chair)?: None Help needed to walk in hospital room?: None Help needed climbing 3-5 steps with a railing? : A Little 6 Click Score: 23    End of Session Equipment Utilized During Treatment: Gait belt;Oxygen Activity Tolerance: Patient tolerated treatment well Patient left: in chair;with call bell/phone within reach;with nursing/sitter in room Nurse Communication: Mobility status PT Visit Diagnosis: Unsteadiness on feet (R26.81);Difficulty in walking, not elsewhere classified (R26.2)     Time: 5697-9480 PT Time Calculation (min) (ACUTE ONLY): 24 min  Charges:  $Gait Training: 8-22 mins $Therapeutic Activity: 8-22 mins                     Felecia Shelling  PTA Acute  Rehabilitation Services Pager      [phone] Office      [phone]

## 2021-06-28 NOTE — Progress Notes (Signed)
ANTICOAGULATION CONSULT NOTE - Follow Up Consult  Pharmacy Consult for Heparin, Warfarin Indication: pulmonary embolus and DVT  Allergies  Allergen Reactions   Lisinopril-Hydrochlorothiazide Other (See Comments)    "Interfered with the pH level in the serum"    Patient Measurements: Height: 5\' 9"  (175.3 cm) Weight: (!) 196.4 kg (433 lb) IBW/kg (Calculated) : 70.7 Heparin Dosing Weight: 113.6 kg  Vital Signs: Temp: 98.3 F (36.8 C) (10/29 0327) Temp Source: Axillary (10/29 0327) BP: 135/83 (10/29 0000) Pulse Rate: 70 (10/29 0000)  Labs: Recent Labs    06/26/21 0704 06/26/21 0705 06/27/21 0456 06/28/21 0441  HGB 11.7*  --  11.7* 11.0*  HCT 36.4*  --  36.2* 34.9*  PLT 198  --  201 193  LABPROT 14.6  --  16.2* 22.0*  INR 1.1  --  1.3* 1.9*  HEPARINUNFRC  --  0.45 0.62 0.66  CREATININE 1.35*  --  1.43* 1.31*     Estimated Creatinine Clearance: 132.1 mL/min (A) (by C-G formula based on SCr of 1.31 mg/dL (H)).   Medications:  Infusions:   sodium chloride Stopped (06/25/21 1918)   heparin 2,450 Units/hr (06/28/21 06/30/21)    Assessment: 34 yoM admitted on 10/24 with SOB and chest pain, found to have bilateral PE with right heart strain.  He was recently admitted for pneumonia (10/8-10/10).  No prior to admission anticoagulation noted.  Pharmacy consulted to dose IV heparin and warfarin for long-term anticoagulation. D-dimer 2.43 (10/24). Baseline coags:  INR 1.1, PTT 27.  S/p TPA 50mg  for PE - infused 10/24 1400-1603  Today, 06/28/2021: Warfarin bridge with heparin started 10/25 (day 5)  HL 0.66 - therapeutic on IV Heparin infusion at 2450 units/hr  INR remains subtherapeutic at 1.9 but trending up Hgb stable at 11.0, Plts WNL and stable No bleeding or infusion related issues reported   Goal of Therapy:  Heparin level 0.3-0.7  INR 2-3 Monitor platelets by anticoagulation protocol: Yes   Plan: - Continue heparin infusion to 2450 units/hr - Warfarin 7.5 mg at  1600 - Daily heparin level, PT/INR, and CBC while on anticoagulation - Patient will need minimum of 5 days warfarin/parental overlap and until INR >2 for 24 hours) - Monitor closely for signs/symptoms of bleeding  06/30/2021, PharmD, BCPS Pharmacy: [phone] [facility], Class of 2023

## 2021-06-28 NOTE — Progress Notes (Signed)
Progress Note    William Moss   XHB:[id number]  DOB: [birth year]  DOA: 06/23/2021     5 Date of Service: 06/28/2021   Clinical Course William Moss is a 35 year old male with PMH obesity, HTN, DMII who presented to the hospital with worsening shortness of breath at home. He was recently hospitalized from 06/07/2021 until 06/09/2021 for community-acquired pneumonia.  He required oxygen during that hospitalization but was able to be weaned off prior to discharge.  After discharge he had been less active and less mobile. On admission, he underwent CT angio chest which showed bilateral pulmonary emboli with evidence of right heart straining on CTA.  Echo was difficult for interpretation and could not elaborate on if presence of RV strain. He was evaluated by IR and not considered a candidate for thrombectomy and therefore was started on tPA per pulmonology.   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  Does have a known family history of PE in William Moss - outpatient hypercoag workup recommened per pulm - s/p TPA on 10/24 - currently on heparin; Coumadin to start 10/25 with heparin bridge; would suspect could also bridge with Lovenox if approaching discharge but given weight would be large dosing; continue heparin while inpatient  - LE duplex: Positive for DVT in Left femoral and popliteal vein  Acute respiratory failure with hypoxia (HCC) - Considered due to bilateral PE -Wean oxygen as able.  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  Has worsened since August 2022 (previously 7.4%) - Continue Semglee; will adjust as  needed - Continue SSI and CBG monitoring  Elevated troponin - Suspected due to demand from PE  Hypomagnesemia - repleted as needed  Morbid obesity with BMI of 60.0-69.9, adult (HCC) - Complicates overall prognosis and care - Body mass index is 66.75 kg/m.  Essential hypertension - Continue amlodipine, Coreg, hydralazine -Use labetalol or hydralazine as needed     Subjective:  No events overnight.  Pleasantly and comfortably resting in bed this morning.  Objective Vitals:   06/28/21 0805 06/28/21 0900 06/28/21 0933 06/28/21 1130  BP:  119/72 126/68   Pulse:  72    Resp:  15    Temp:    97.6 F (36.4 C)  TempSrc:    Axillary  SpO2: 98% 99%    Weight:      Height:       (!) 196.4 kg  Vital signs were reviewed and unremarkable.   Exam Physical Exam Constitutional:      General: He is not in acute distress.    Appearance: He is well-developed. He is obese.  HENT:     Head: Normocephalic and atraumatic.     Mouth/Throat:     Mouth: Mucous membranes are moist.  Eyes:     Extraocular Movements: Extraocular movements intact.  Cardiovascular:     Rate and Rhythm: Normal rate and regular rhythm.     Heart sounds: Normal heart sounds.  Pulmonary:     Effort: Pulmonary effort is normal. No respiratory distress.     Breath sounds: Normal breath sounds.  Abdominal:     General: Bowel sounds are normal. There is no  distension.     Palpations: Abdomen is soft.     Tenderness: There is no abdominal tenderness.  Musculoskeletal:        General: Normal range of motion.     Cervical back: Normal range of motion.  Skin:    General: Skin is warm and dry.  Neurological:     General: No focal deficit present.     Mental Status: He is alert.  Psychiatric:        Mood and Affect: Mood normal.        Behavior: Behavior normal.     Labs / Other Information My review of labs, imaging, notes and other tests shows no new significant findings.    Disposition  Plan: Status is: Inpatient  Remains inpatient appropriate because: Treatment of above     Time spent: Greater than 50% of the 35 minute visit was spent in counseling/coordination of care for the patient as laid out in the A&P.  [name], MD [facility] 06/28/2021, 12:56 PM

## 2021-06-29 LAB — BASIC METABOLIC PANEL
Anion gap: 7 (ref 5–15)
BUN: 22 mg/dL — ABNORMAL HIGH (ref 6–20)
CO2: 21 mmol/L — ABNORMAL LOW (ref 22–32)
Calcium: 8.9 mg/dL (ref 8.9–10.3)
Chloride: 106 mmol/L (ref 98–111)
Creatinine, Ser: 0.95 mg/dL (ref 0.61–1.24)
GFR, Estimated: >60 mL/min (ref >60)
Glucose, Bld: 260 mg/dL — ABNORMAL HIGH (ref 70–99)
Potassium: 4.3 mmol/L (ref 3.5–5.1)
Sodium: 134 mmol/L — ABNORMAL LOW (ref 135–145)

## 2021-06-29 LAB — GLUCOSE, CAPILLARY
Glucose-Capillary: 247 mg/dL — ABNORMAL HIGH (ref 70–99)
Glucose-Capillary: 290 mg/dL — ABNORMAL HIGH (ref 70–99)
Glucose-Capillary: 226 mg/dL — ABNORMAL HIGH (ref 70–99)
Glucose-Capillary: 195 mg/dL — ABNORMAL HIGH (ref 70–99)

## 2021-06-29 LAB — CBC WITH DIFFERENTIAL/PLATELET
Abs Immature Granulocytes: 0.07 10*3/uL (ref 0.00–0.07)
Basophils Absolute: 0 10*3/uL (ref 0.0–0.1)
Basophils Relative: 0 %
Eosinophils Absolute: 0.3 10*3/uL (ref 0.0–0.5)
Eosinophils Relative: 5 %
HCT: 34.3 % — ABNORMAL LOW (ref 39.0–52.0)
Hemoglobin: 10.9 g/dL — ABNORMAL LOW (ref 13.0–17.0)
Immature Granulocytes: 1 %
Lymphocytes Relative: 36 %
Lymphs Abs: 2.5 10*3/uL (ref 0.7–4.0)
MCH: 26.7 pg (ref 26.0–34.0)
MCHC: 31.8 g/dL (ref 30.0–36.0)
MCV: 84.1 fL (ref 80.0–100.0)
Monocytes Absolute: 0.8 10*3/uL (ref 0.1–1.0)
Monocytes Relative: 11 %
Neutro Abs: 3.3 10*3/uL (ref 1.7–7.7)
Neutrophils Relative %: 47 %
Platelets: 201 10*3/uL (ref 150–400)
RBC: 4.08 MIL/uL — ABNORMAL LOW (ref 4.22–5.81)
RDW: 14.1 % (ref 11.5–15.5)
WBC: 6.9 10*3/uL (ref 4.0–10.5)
nRBC: 0 % (ref 0.0–0.2)

## 2021-06-29 LAB — HEPARIN LEVEL (UNFRACTIONATED)
Heparin Unfractionated: 0.76 IU/mL — ABNORMAL HIGH (ref 0.30–0.70)
Heparin Unfractionated: 0.55 IU/mL (ref 0.30–0.70)

## 2021-06-29 LAB — PROTIME-INR
INR: 2 — ABNORMAL HIGH (ref 0.8–1.2)
Prothrombin Time: 22.9 seconds — ABNORMAL HIGH (ref 11.4–15.2)

## 2021-06-29 LAB — MAGNESIUM: Magnesium: 2 mg/dL (ref 1.7–2.4)

## 2021-06-29 MED ORDER — WARFARIN SODIUM 5 MG PO TABS
10.0000 mg | ORAL_TABLET | Freq: Once | ORAL | Status: AC
  Administered 2021-06-29: 10 mg via ORAL
  Filled 2021-06-29: qty 2

## 2021-06-29 MED ORDER — INSULIN ASPART 100 UNIT/ML IJ SOLN
0.0000 [IU] | Freq: Three times a day (TID) | INTRAMUSCULAR | Status: DC
  Administered 2021-06-29: 7 [IU] via SUBCUTANEOUS
  Administered 2021-06-29 – 2021-06-30 (×2): 11 [IU] via SUBCUTANEOUS
  Administered 2021-06-30 (×2): 7 [IU] via SUBCUTANEOUS
  Administered 2021-07-01 (×2): 11 [IU] via SUBCUTANEOUS
  Administered 2021-07-01: 4 [IU] via SUBCUTANEOUS
  Administered 2021-07-02: 7 [IU] via SUBCUTANEOUS
  Administered 2021-07-02: 11 [IU] via SUBCUTANEOUS
  Administered 2021-07-02 – 2021-07-03 (×2): 4 [IU] via SUBCUTANEOUS
  Administered 2021-07-03: 11 [IU] via SUBCUTANEOUS
  Administered 2021-07-03: 7 [IU] via SUBCUTANEOUS

## 2021-06-29 MED ORDER — INSULIN ASPART 100 UNIT/ML IJ SOLN
10.0000 [IU] | Freq: Three times a day (TID) | INTRAMUSCULAR | Status: DC
  Administered 2021-06-29 – 2021-07-03 (×14): 10 [IU] via SUBCUTANEOUS

## 2021-06-29 MED ORDER — HEPARIN (PORCINE) 25000 UT/250ML-% IV SOLN
2300.0000 [IU]/h | INTRAVENOUS | Status: DC
  Administered 2021-06-29 – 2021-06-30 (×2): 2300 [IU]/h via INTRAVENOUS
  Filled 2021-06-29 (×2): qty 250

## 2021-06-29 MED ORDER — INSULIN DETEMIR 100 UNIT/ML ~~LOC~~ SOLN
25.0000 [IU] | Freq: Every day | SUBCUTANEOUS | Status: DC
  Administered 2021-06-29 – 2021-07-03 (×5): 25 [IU] via SUBCUTANEOUS
  Filled 2021-06-29 (×5): qty 0.25

## 2021-06-29 NOTE — Progress Notes (Signed)
Progress Note    William Moss   [id number]  DOB: 1986/08/08  DOA: 06/23/2021     6 Date of Service: 06/29/2021   Clinical Course William Moss is a 35 year old male with PMH obesity, HTN, DMII who presented to the hospital with worsening shortness of breath at home. He was recently hospitalized from 06/07/2021 until 06/09/2021 for community-acquired pneumonia.  He required oxygen during that hospitalization but was able to be weaned off prior to discharge.  After discharge he had been less active and less mobile. On admission, he underwent CT angio chest which showed bilateral pulmonary emboli with evidence of right heart straining on CTA.  Echo was difficult for interpretation and could not elaborate on if presence of RV strain. He was evaluated by IR and not considered a candidate for thrombectomy and therefore was started on tPA per pulmonology.  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  Does have a known family history of PE in his dad - outpatient hypercoag workup recommened per pulm - s/p TPA on 10/24 - currently on heparin; Coumadin started 10/25 with heparin bridge; would suspect could also bridge with Lovenox if approaching discharge but given weight would be large dosing; continue heparin while inpatient  - LE duplex: Positive for DVT in Left femoral and popliteal vein  Acute respiratory failure with hypoxia (HCC) - Considered due to bilateral PE -Wean oxygen as able.  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  Has worsened since August 2022 (previously 7.4%) - Continue Semglee; will adjust as  needed - Continue SSI and CBG monitoring  Elevated troponin - Suspected due to demand from PE  Hypomagnesemia - repleted as needed  Morbid obesity with BMI of 60.0-69.9, adult (HCC) - Complicates overall prognosis and care - Body mass index is 66.75 kg/m.  Essential hypertension - Continue amlodipine, Coreg, hydralazine -Use labetalol or hydralazine as needed     Subjective:  No events overnight.  Resting in bed comfortably and still no concerns or questions this morning.  Oxygen continues to be weaned slowly.  Objective Vitals:   06/29/21 1018 06/29/21 1100 06/29/21 1200 06/29/21 1211  BP: 123/77 122/71 128/72   Pulse:  68 73   Resp:  19 13   Temp:   97.6 F (36.4 C) 97.6 F (36.4 C)  TempSrc:   Oral Oral  SpO2:  100% 100%   Weight:      Height:       (!) 194 kg  Vital signs were reviewed and unremarkable.   Exam Physical Exam Constitutional:      General: He is not in acute distress.    Appearance: He is well-developed. He is obese.  HENT:     Head: Normocephalic and atraumatic.     Mouth/Throat:     Mouth: Mucous membranes are moist.  Eyes:     Extraocular Movements: Extraocular movements intact.  Cardiovascular:     Rate and Rhythm: Normal rate and regular rhythm.     Heart sounds: Normal heart sounds.  Pulmonary:     Effort: Pulmonary effort is normal. No respiratory distress.     Breath sounds: Normal breath sounds.  Abdominal:  General: Bowel sounds are normal. There is no distension.     Palpations: Abdomen is soft.     Tenderness: There is no abdominal tenderness.  Musculoskeletal:        General: Normal range of motion.     Cervical back: Normal range of motion.  Skin:    General: Skin is warm and dry.  Neurological:     General: No focal deficit present.     Mental Status: He is alert.  Psychiatric:        Mood and Affect: Mood normal.        Behavior: Behavior normal.     Labs / Other Information My review of labs, imaging,  notes and other tests shows no new significant findings.    Disposition Plan: Status is: Inpatient  Remains inpatient appropriate because: Ongoing treatment of above     Time spent: Greater than 50% of the 35 minute visit was spent in counseling/coordination of care for the patient as laid out in the A&P.  [name], MD [facility] 06/29/2021, 1:09 PM

## 2021-06-29 NOTE — Progress Notes (Signed)
ANTICOAGULATION CONSULT NOTE - Follow Up Consult  Pharmacy Consult for Heparin, Warfarin Indication: pulmonary embolus and DVT  Allergies  Allergen Reactions   Lisinopril-Hydrochlorothiazide Other (See Comments)    "Interfered with the pH level in the serum"    Patient Measurements: Height: 5\' 9"  (175.3 cm) Weight: (!) 194 kg (427 lb 11.1 oz) IBW/kg (Calculated) : 70.7 Heparin Dosing Weight: 113.6 kg  Vital Signs: Temp: 97.7 F (36.5 C) (10/30 0300) Temp Source: Axillary (10/30 0300) BP: 135/87 (10/30 0600) Pulse Rate: 64 (10/30 0600)  Labs: Recent Labs    06/27/21 0456 06/28/21 0441 06/29/21 0426 06/29/21 0427  HGB 11.7* 11.0* 10.9*  --   HCT 36.2* 34.9* 34.3*  --   PLT 201 193 201  --   LABPROT 16.2* 22.0* 22.9*  --   INR 1.3* 1.9* 2.0*  --   HEPARINUNFRC 0.62 0.66  --  0.76*  CREATININE 1.43* 1.31* 0.95  --      Estimated Creatinine Clearance: 180.7 mL/min (by C-G formula based on SCr of 0.95 mg/dL).   Medications:  Infusions:   sodium chloride Stopped (06/25/21 1918)   heparin 2,450 Units/hr (06/29/21 0654)    Assessment: 34 yoM admitted on 10/24 with SOB and chest pain, found to have bilateral PE with right heart strain.  He was recently admitted for pneumonia (10/8-10/10).  No prior to admission anticoagulation noted.  Pharmacy consulted to dose IV heparin and warfarin for long-term anticoagulation. D-dimer 2.43 (10/24). Baseline coags:  INR 1.1, PTT 27.  S/p TPA 50mg  for PE - infused 10/24 1400-1603  Today, 06/29/2021: Warfarin bridge with heparin started 10/25 (day 6)  HL 0.76 - therapeutic on IV Heparin infusion at 2450 units/hr  INR at low end of therapeutic 2.0 for the first time Hgb stable at 10.9, Plts WNL and stable No bleeding or infusion related issues reported   Goal of Therapy:  Heparin level 0.3-0.7  INR 2-3 Monitor platelets by anticoagulation protocol: Yes   Plan: - Decrease heparin infusion to 2300 units/hr - Warfarin 10 mg at  1600 - Daily heparin level, PT/INR, and CBC while on anticoagulation - Patient will need minimum of 5 days warfarin/parental overlap and until INR >2 for 24 hours) - Monitor closely for signs/symptoms of bleeding  07/01/2021, PharmD, BCPS Pharmacy: [phone] [facility], [employer]

## 2021-06-29 NOTE — Progress Notes (Signed)
Pharmacy: Re- heparin  Patient is a 35 y.o M currently on heparin/warfarin for acute PE and DVT  -Heparin level collected at 2p is therapeutic at 0.55 after rate adjusted to 2300 units/hr.  Goal of Therapy:  Heparin level 0.3-0.7  INR 2-3 Monitor platelets by anticoagulation protocol: Yes  Plan: - continue heparin drip at 2300 units/hr - monitor for s/sx bleeding - If INR remains therapeutic on 10/31, then consider d/cing heparin drip  Dorna Leitz, PharmD, BCPS 06/29/2021 3:30 PM

## 2021-06-30 LAB — CBC WITH DIFFERENTIAL/PLATELET
Abs Immature Granulocytes: 0.09 10*3/uL — ABNORMAL HIGH (ref 0.00–0.07)
Basophils Absolute: 0.1 10*3/uL (ref 0.0–0.1)
Basophils Relative: 1 %
Eosinophils Absolute: 0.3 10*3/uL (ref 0.0–0.5)
Eosinophils Relative: 4 %
HCT: 36.3 % — ABNORMAL LOW (ref 39.0–52.0)
Hemoglobin: 11.3 g/dL — ABNORMAL LOW (ref 13.0–17.0)
Immature Granulocytes: 1 %
Lymphocytes Relative: 32 %
Lymphs Abs: 2.6 10*3/uL (ref 0.7–4.0)
MCH: 26.3 pg (ref 26.0–34.0)
MCHC: 31.1 g/dL (ref 30.0–36.0)
MCV: 84.6 fL (ref 80.0–100.0)
Monocytes Absolute: 0.9 10*3/uL (ref 0.1–1.0)
Monocytes Relative: 11 %
Neutro Abs: 4 10*3/uL (ref 1.7–7.7)
Neutrophils Relative %: 51 %
Platelets: 220 10*3/uL (ref 150–400)
RBC: 4.29 MIL/uL (ref 4.22–5.81)
RDW: 14.4 % (ref 11.5–15.5)
WBC: 7.9 10*3/uL (ref 4.0–10.5)
nRBC: 0 % (ref 0.0–0.2)

## 2021-06-30 LAB — GLUCOSE, CAPILLARY
Glucose-Capillary: 228 mg/dL — ABNORMAL HIGH (ref 70–99)
Glucose-Capillary: 266 mg/dL — ABNORMAL HIGH (ref 70–99)
Glucose-Capillary: 202 mg/dL — ABNORMAL HIGH (ref 70–99)
Glucose-Capillary: 141 mg/dL — ABNORMAL HIGH (ref 70–99)

## 2021-06-30 LAB — BASIC METABOLIC PANEL WITH GFR
Anion gap: 7 (ref 5–15)
BUN: 24 mg/dL — ABNORMAL HIGH (ref 6–20)
CO2: 23 mmol/L (ref 22–32)
Calcium: 9.2 mg/dL (ref 8.9–10.3)
Chloride: 104 mmol/L (ref 98–111)
Creatinine, Ser: 1.31 mg/dL — ABNORMAL HIGH (ref 0.61–1.24)
GFR, Estimated: >60 mL/min
Glucose, Bld: 203 mg/dL — ABNORMAL HIGH (ref 70–99)
Potassium: 4.6 mmol/L (ref 3.5–5.1)
Sodium: 134 mmol/L — ABNORMAL LOW (ref 135–145)

## 2021-06-30 LAB — PROTIME-INR
INR: 2.2 — ABNORMAL HIGH (ref 0.8–1.2)
Prothrombin Time: 24.6 s — ABNORMAL HIGH (ref 11.4–15.2)

## 2021-06-30 LAB — HEPARIN LEVEL (UNFRACTIONATED): Heparin Unfractionated: 0.62 [IU]/mL (ref 0.30–0.70)

## 2021-06-30 LAB — MAGNESIUM: Magnesium: 2 mg/dL (ref 1.7–2.4)

## 2021-06-30 MED ORDER — WARFARIN SODIUM 5 MG PO TABS
10.0000 mg | ORAL_TABLET | Freq: Once | ORAL | Status: AC
  Administered 2021-06-30: 10 mg via ORAL
  Filled 2021-06-30: qty 2

## 2021-06-30 NOTE — Progress Notes (Signed)
Progress Note    KAHLEB MCCLANE   [id number]:[id number]  DOB: 03/07/1986  DOA: 06/23/2021     7 Date of Service: 06/30/2021   Clinical Course Mr. Kitt is a 35 year old male with PMH obesity, HTN, DMII who presented to the hospital with worsening shortness of breath at home. He was recently hospitalized from 06/07/2021 until 06/09/2021 for community-acquired pneumonia.  He required oxygen during that hospitalization but was able to be weaned off prior to discharge.  After discharge he had been less active and less mobile. On admission, he underwent CT angio chest which showed bilateral pulmonary emboli with evidence of right heart straining on CTA.  Echo was difficult for interpretation and could not elaborate on if presence of RV strain. He was evaluated by IR and not considered a candidate for thrombectomy and therefore was started on tPA per pulmonology.  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  Does have a known family history of PE in his dad - outpatient hypercoag workup recommened per pulm - s/p TPA on 10/24 - currently on heparin; Coumadin started 10/25 with heparin bridge; would suspect could also bridge with Lovenox if approaching discharge but given weight would be large dosing; continue heparin while inpatient  - LE duplex: Positive for DVT in Left femoral and popliteal vein  Acute respiratory failure with hypoxia (HCC) - Considered due to bilateral PE -Wean oxygen as able.  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  Has worsened since August 2022 (previously 7.4%) - Continue Semglee; will adjust as  needed - Continue SSI and CBG monitoring  Elevated troponin - Suspected due to demand from PE  Hypomagnesemia - repleted as needed  Morbid obesity with BMI of 60.0-69.9, adult (HCC) - Complicates overall prognosis and care - Body mass index is 66.75 kg/m.  Essential hypertension - Continue amlodipine, Coreg, hydralazine -Use labetalol or hydralazine as needed    Subjective:  No events overnight.  Oxygen continues to be weaned.  Denies any bleeding.  Objective Vitals:   06/30/21 0412 06/30/21 0500 06/30/21 1104 06/30/21 1246  BP: 138/84  125/75 111/72  Pulse: 66   72  Resp: 18   18  Temp: 98.5 F (36.9 C)   99.1 F (37.3 C)  TempSrc: Oral   Oral  SpO2: 100%   98%  Weight:  (!) 198 kg    Height:  5\' 9"  (1.753 m)     (!) 198 kg  Vital signs were reviewed and unremarkable.   Exam Physical Exam Constitutional:      General: He is not in acute distress.    Appearance: He is well-developed. He is obese.  HENT:     Head: Normocephalic and atraumatic.     Mouth/Throat:     Mouth: Mucous membranes are moist.  Eyes:     Extraocular Movements: Extraocular movements intact.  Cardiovascular:     Rate and Rhythm: Normal rate and regular rhythm.     Heart sounds: Normal heart sounds.  Pulmonary:     Effort: Pulmonary effort is normal. No respiratory distress.     Breath sounds: Normal breath sounds.  Abdominal:     General: Bowel  sounds are normal. There is no distension.     Palpations: Abdomen is soft.     Tenderness: There is no abdominal tenderness.  Musculoskeletal:        General: Normal range of motion.     Cervical back: Normal range of motion.  Skin:    General: Skin is warm and dry.  Neurological:     General: No focal deficit present.     Mental Status: He is alert.  Psychiatric:        Mood and Affect: Mood normal.        Behavior: Behavior normal.     Labs / Other Information My review of labs, imaging, notes and other tests shows no new  significant findings.    Disposition Plan: Status is: Inpatient  Remains inpatient appropriate because: Ongoing treatment of above    Time spent: Greater than 50% of the 35 minute visit was spent in counseling/coordination of care for the patient as laid out in the A&P.  Dwyane Dee, MD [facility] 06/30/2021, 12:57 PM

## 2021-06-30 NOTE — Progress Notes (Signed)
Pharmacy: Re- heparin  Patient is a 35 y.o M currently on heparin/warfarin for acute PE and DVT  -Heparin level collected at 04:14 is therapeutic at 0.62 with heparin infusion running at 2300 units/hr.  Goal of Therapy:  Heparin level 0.3-0.7  INR 2-3 Monitor platelets by anticoagulation protocol: Yes  Plan: - continue heparin drip at 2300 units/hr - monitor for s/sx bleeding - If INR remains therapeutic, then consider d/cing heparin drip  Valentina Gu  06/30/2021 4:56 AM

## 2021-06-30 NOTE — Progress Notes (Incomplete)
Inpatient Diabetes Program Recommendations  AACE/ADA: New Consensus Statement on Inpatient Glycemic Control (2015)  Target Ranges:  Prepandial:   less than 140 mg/dL      Peak postprandial:   less than 180 mg/dL (1-2 hours)      Critically ill patients:  140 - 180 mg/dL   Lab Results  Component Value Date   GLUCAP 228 (H) 06/30/2021   HGBA1C 10.8 (H) 06/24/2021    Review of Glycemic Control  Diabetes history: DM2  Current orders for Inpatient glycemic control: Levemir 25 units QD, Novolog 10 units TID with meals  Inpatient Diabetes Program Recommendations:

## 2021-06-30 NOTE — Plan of Care (Signed)

## 2021-06-30 NOTE — Progress Notes (Signed)
ANTICOAGULATION CONSULT NOTE - Follow Up Consult  Pharmacy Consult for Heparin, Warfarin Indication: pulmonary embolus and DVT  Allergies  Allergen Reactions   Lisinopril-Hydrochlorothiazide Other (See Comments)    "Interfered with the pH level in the serum"    Patient Measurements: Height: 5\' 9"  (175.3 cm) Weight: (!) 198 kg (436 lb 8.2 oz) IBW/kg (Calculated) : 70.7 Heparin Dosing Weight: 113.6 kg  Vital Signs: Temp: 98.5 F (36.9 C) (10/31 0412) Temp Source: Oral (10/31 0412) BP: 138/84 (10/31 0412) Pulse Rate: 66 (10/31 0412)  Labs: Recent Labs    06/28/21 0441 06/29/21 0426 06/29/21 0427 06/29/21 1407 06/30/21 0414  HGB 11.0* 10.9*  --   --  11.3*  HCT 34.9* 34.3*  --   --  36.3*  PLT 193 201  --   --  220  LABPROT 22.0* 22.9*  --   --  24.6*  INR 1.9* 2.0*  --   --  2.2*  HEPARINUNFRC 0.66  --  0.76* 0.55 0.62  CREATININE 1.31* 0.95  --   --  1.31*     Estimated Creatinine Clearance: 132.8 mL/min (A) (by C-G formula based on SCr of 1.31 mg/dL (H)).   Medications:  Infusions:   sodium chloride Stopped (06/25/21 1918)    Assessment: 34 yoM admitted on 10/24 with SOB and chest pain, found to have bilateral PE with right heart strain.  He was recently admitted for pneumonia (10/8-10/10).  No prior to admission anticoagulation noted.  Pharmacy consulted to dose IV heparin and warfarin for long-term anticoagulation. D-dimer 2.43 (10/24). Baseline coags:  INR 1.1, PTT 27.  S/p TPA 50mg  for PE - infused 10/24 1400-1603  Today, 06/30/2021: Warfarin bridge with heparin started 10/25, heparin stopped today  INR  therapeutic at 2.2, second therapeutic level Hgb stable at 11.3  Plts WNL and stable No bleeding or infusion related issues reported   Goal of Therapy:  Heparin level 0.3-0.7  INR 2-3 Monitor platelets by anticoagulation protocol: Yes   Plan: - heparin infusion  has been stopped as INR is therepautic  - Warfarin 10 mg at 1600 - Daily  PT/INR,  and CBC while on anticoagulation - Monitor closely for signs/symptoms of bleeding  07/02/2021, PharmD, BCPS 06/30/2021 9:47 AM

## 2021-07-01 LAB — GLUCOSE, CAPILLARY
Glucose-Capillary: 195 mg/dL — ABNORMAL HIGH (ref 70–99)
Glucose-Capillary: 297 mg/dL — ABNORMAL HIGH (ref 70–99)
Glucose-Capillary: 281 mg/dL — ABNORMAL HIGH (ref 70–99)
Glucose-Capillary: 222 mg/dL — ABNORMAL HIGH (ref 70–99)

## 2021-07-01 LAB — PROTIME-INR
INR: 2.4 — ABNORMAL HIGH (ref 0.8–1.2)
Prothrombin Time: 26.4 seconds — ABNORMAL HIGH (ref 11.4–15.2)

## 2021-07-01 LAB — CBC WITH DIFFERENTIAL/PLATELET
Abs Immature Granulocytes: 0.06 10*3/uL (ref 0.00–0.07)
Basophils Absolute: 0 10*3/uL (ref 0.0–0.1)
Basophils Relative: 0 %
Eosinophils Absolute: 0.2 10*3/uL (ref 0.0–0.5)
Eosinophils Relative: 3 %
HCT: 36.6 % — ABNORMAL LOW (ref 39.0–52.0)
Hemoglobin: 11.6 g/dL — ABNORMAL LOW (ref 13.0–17.0)
Immature Granulocytes: 1 %
Lymphocytes Relative: 31 %
Lymphs Abs: 2.2 10*3/uL (ref 0.7–4.0)
MCH: 26.8 pg (ref 26.0–34.0)
MCHC: 31.7 g/dL (ref 30.0–36.0)
MCV: 84.5 fL (ref 80.0–100.0)
Monocytes Absolute: 0.8 10*3/uL (ref 0.1–1.0)
Monocytes Relative: 11 %
Neutro Abs: 3.8 10*3/uL (ref 1.7–7.7)
Neutrophils Relative %: 54 %
Platelets: 216 10*3/uL (ref 150–400)
RBC: 4.33 MIL/uL (ref 4.22–5.81)
RDW: 14.6 % (ref 11.5–15.5)
WBC: 7 10*3/uL (ref 4.0–10.5)
nRBC: 0 % (ref 0.0–0.2)

## 2021-07-01 LAB — BASIC METABOLIC PANEL
Anion gap: 6 (ref 5–15)
BUN: 22 mg/dL — ABNORMAL HIGH (ref 6–20)
CO2: 24 mmol/L (ref 22–32)
Calcium: 9.1 mg/dL (ref 8.9–10.3)
Chloride: 105 mmol/L (ref 98–111)
Creatinine, Ser: 1.33 mg/dL — ABNORMAL HIGH (ref 0.61–1.24)
GFR, Estimated: >60 mL/min (ref >60)
Glucose, Bld: 197 mg/dL — ABNORMAL HIGH (ref 70–99)
Potassium: 4.6 mmol/L (ref 3.5–5.1)
Sodium: 135 mmol/L (ref 135–145)

## 2021-07-01 LAB — MAGNESIUM: Magnesium: 1.9 mg/dL (ref 1.7–2.4)

## 2021-07-01 MED ORDER — WARFARIN SODIUM 5 MG PO TABS
10.0000 mg | ORAL_TABLET | Freq: Once | ORAL | Status: AC
  Administered 2021-07-01: 10 mg via ORAL
  Filled 2021-07-01: qty 2

## 2021-07-01 NOTE — Plan of Care (Signed)
  Problem: Education: Goal: Knowledge of General Education information will improve Description: Including pain rating scale, medication(s)/side effects and non-pharmacologic comfort measures 07/01/2021 1400 by Charmian Muff, RN Outcome: Progressing 07/01/2021 1352 by Charmian Muff, RN Outcome: Progressing   Problem: Activity: Goal: Risk for activity intolerance will decrease 07/01/2021 1400 by Charmian Muff, RN Outcome: Progressing 07/01/2021 1352 by Charmian Muff, RN Outcome: Progressing   Problem: Pain Managment: Goal: General experience of comfort will improve Outcome: Progressing   Problem: Safety: Goal: Ability to remain free from injury will improve Outcome: Progressing

## 2021-07-01 NOTE — Progress Notes (Signed)
Progress Note    William Moss   [id number]  DOB: 1985-12-15  DOA: 06/23/2021     8 Date of Service: 07/01/2021   Clinical Course William Moss is a 35 year old male with PMH obesity, HTN, DMII who presented to the hospital with worsening shortness of breath at home. He was recently hospitalized from 06/07/2021 until 06/09/2021 for community-acquired pneumonia.  He required oxygen during that hospitalization but was able to be weaned off prior to discharge.  After discharge he had been less active and less mobile. On admission, he underwent CT angio chest which showed bilateral pulmonary emboli with evidence of right heart straining on CTA.  Echo was difficult for interpretation and could not elaborate on if presence of RV strain. He was evaluated by IR and not considered a candidate for thrombectomy and therefore was started on tPA per pulmonology.  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  Does have a known family history of PE in his dad - outpatient hypercoag workup recommened per pulm - s/p TPA on 10/24 - s/p heparin; Coumadin started 10/25 with heparin bridge - LE duplex: Positive for DVT in Left femoral and popliteal vein - INR remains therapeutic; continue Coumadin per pharmacy and will need dosing rec's and followup at discharge  Acute respiratory failure with hypoxia ([place]) - Considered due to bilateral PE -Wean oxygen as able.  Last hospitalization was weaned off of oxygen, however may have greater difficulty given recurrent respiratory problems  -Has been weaning well the last couple days, currently down to 3 L nasal cannula.  Goal is to attempt to wean to room air prior to discharge if able to be accomplished in the next 1 to 2 days, otherwise may need home O2  Type II  diabetes mellitus with stage 2 chronic kidney disease ([place]) - A1c 10.8% on 06/24/2021.  Has worsened since August 2022 (previously 7.4%) - Continue Semglee; will adjust as needed - Continue SSI and CBG monitoring  Elevated troponin - Suspected due to demand from PE  Hypomagnesemia - repleted as needed  Morbid obesity with BMI of 60.0-69.9, adult ([place]) - Complicates overall prognosis and care - Body mass index is 66.75 kg/m.  Essential hypertension - Continue amlodipine, Coreg, hydralazine -Use labetalol or hydralazine as needed    Subjective:  No events overnight.  Oxygen continues to be weaned.  Denies any bleeding. We discussed possibly discharge home in the next 2 to 3 days.   Objective Vitals:   07/01/21 0600 07/01/21 0930 07/01/21 0945 07/01/21 1152  BP:  104/63    Pulse:  79    Resp:  16    Temp:  98.7 F (37.1 C)    TempSrc:  Oral    SpO2:  100% 96% 95%  Weight: (!) 196 kg     Height:       (!) 196 kg  Vital signs were reviewed and unremarkable.   Exam Physical Exam Constitutional:      General: He is not in acute distress.    Appearance: He is well-developed. He is obese.  HENT:     Head: Normocephalic and atraumatic.     Mouth/Throat:     Mouth: Mucous membranes are moist.  Eyes:     Extraocular Movements: Extraocular movements intact.  Cardiovascular:     Rate and  Rhythm: Normal rate and regular rhythm.     Heart sounds: Normal heart sounds.  Pulmonary:     Effort: Pulmonary effort is normal. No respiratory distress.     Breath sounds: Normal breath sounds.  Abdominal:     General: Bowel sounds are normal. There is no distension.     Palpations: Abdomen is soft.     Tenderness: There is no abdominal tenderness.  Musculoskeletal:        General: Normal range of motion.     Cervical back: Normal range of motion.  Skin:    General: Skin is warm and dry.  Neurological:     General: No focal deficit present.     Mental Status: He is alert.   Psychiatric:        Mood and Affect: Mood normal.        Behavior: Behavior normal.     Labs / Other Information My review of labs, imaging, notes and other tests shows no new significant findings.    Disposition Plan: Status is: Inpatient  Remains inpatient appropriate because: Ongoing treatment of above    Time spent: Greater than 50% of the 35 minute visit was spent in counseling/coordination of care for the patient as laid out in the A&P.  William Chamber, MD [facility] 07/01/2021, 1:10 PM

## 2021-07-01 NOTE — Plan of Care (Signed)
  Problem: Education: Goal: Knowledge of General Education information will improve Description: Including pain rating scale, medication(s)/side effects and non-pharmacologic comfort measures Outcome: Progressing   Problem: Activity: Goal: Risk for activity intolerance will decrease Outcome: Progressing   Problem: Pain Managment: Goal: General experience of comfort will improve Outcome: Progressing   Problem: Safety: Goal: Ability to remain free from injury will improve Outcome: Progressing   Problem: Education: Goal: Knowledge of General Education information will improve Description: Including pain rating scale, medication(s)/side effects and non-pharmacologic comfort measures Outcome: Progressing   Problem: Activity: Goal: Risk for activity intolerance will decrease Outcome: Progressing   Problem: Safety: Goal: Ability to remain free from injury will improve Outcome: Progressing   Problem: Pain Managment: Goal: General experience of comfort will improve Outcome: Progressing

## 2021-07-01 NOTE — Progress Notes (Signed)
PHYSICAL THERAPY  Pt just got back to room from amb with RN.  Will attempt to see later today or tomorrow as schedule permits.  Felecia Shelling  PTA Acute  Rehabilitation Services Pager      [phone] Office      [phone]

## 2021-07-01 NOTE — Progress Notes (Signed)
SATURATION QUALIFICATIONS: (This note is used to comply with regulatory documentation for home oxygen)  Patient Saturations on Room Air at Rest 91%  Patient Saturations on Room Air while Ambulating 85%  Patient Saturations on 3 Liters of oxygen while Ambulating 91%  Please briefly explain why patient needs home oxygen:  Pt desats when completing basic ADL's and when O2 is off/removed for an extended period of time. Pt is symptomatic when O2 is off with dyspnea. SRP, RN

## 2021-07-01 NOTE — Progress Notes (Signed)
ANTICOAGULATION CONSULT NOTE - Follow Up Consult  Pharmacy Consult for Heparin, Warfarin Indication: pulmonary embolus and DVT  Allergies  Allergen Reactions   Lisinopril-Hydrochlorothiazide Other (See Comments)    "Interfered with the pH level in the serum"    Patient Measurements: Height: 5\' 9"  (175.3 cm) Weight: (!) 196 kg (432 lb 1.6 oz) IBW/kg (Calculated) : 70.7 Heparin Dosing Weight: 113.6 kg  Vital Signs: Temp: 98.7 F (37.1 C) (11/01 0930) Temp Source: Oral (11/01 0930) BP: 104/63 (11/01 0930) Pulse Rate: 79 (11/01 0930)  Labs: Recent Labs    06/29/21 0426 06/29/21 0427 06/29/21 1407 06/30/21 0414 07/01/21 0405  HGB 10.9*  --   --  11.3* 11.6*  HCT 34.3*  --   --  36.3* 36.6*  PLT 201  --   --  220 216  LABPROT 22.9*  --   --  24.6* 26.4*  INR 2.0*  --   --  2.2* 2.4*  HEPARINUNFRC  --  0.76* 0.55 0.62  --   CREATININE 0.95  --   --  1.31* 1.33*     Estimated Creatinine Clearance: 129.9 mL/min (A) (by C-G formula based on SCr of 1.33 mg/dL (H)).   Medications:  Infusions:   sodium chloride Stopped (06/25/21 1918)    Assessment: 34 yoM admitted on 10/24 with SOB and chest pain, found to have bilateral PE with right heart strain.  He was recently admitted for pneumonia (10/8-10/10).  No prior to admission anticoagulation noted.  Pharmacy consulted to dose IV heparin and warfarin for long-term anticoagulation. D-dimer 2.43 (10/24). Baseline coags:  INR 1.1, PTT 27.  S/p TPA 50mg  for PE - infused 10/24 1400-1603  Today, 07/01/2021: Warfarin bridge with heparin completed on 10/31 INR  therapeutic at 2.4,   therapeutic   Hgb stable at 11.6  Plts WNL and stable No bleeding or infusion related issues reported   Goal of Therapy:  Heparin level 0.3-0.7  INR 2-3 Monitor platelets by anticoagulation protocol: Yes   Plan: Warfarin 10 mg at 1600 Daily  PT/INR, and CBC while on anticoagulation Monitor closely for signs/symptoms of bleeding  13/08/2020, PharmD, BCPS 07/01/2021 10:08 AM

## 2021-07-02 LAB — GLUCOSE, CAPILLARY
Glucose-Capillary: 175 mg/dL — ABNORMAL HIGH (ref 70–99)
Glucose-Capillary: 274 mg/dL — ABNORMAL HIGH (ref 70–99)
Glucose-Capillary: 231 mg/dL — ABNORMAL HIGH (ref 70–99)
Glucose-Capillary: 195 mg/dL — ABNORMAL HIGH (ref 70–99)

## 2021-07-02 LAB — CBC WITH DIFFERENTIAL/PLATELET
Abs Immature Granulocytes: 0.07 10*3/uL (ref 0.00–0.07)
Basophils Absolute: 0 10*3/uL (ref 0.0–0.1)
Basophils Relative: 0 %
Eosinophils Absolute: 0.2 10*3/uL (ref 0.0–0.5)
Eosinophils Relative: 3 %
HCT: 37 % — ABNORMAL LOW (ref 39.0–52.0)
Hemoglobin: 11.8 g/dL — ABNORMAL LOW (ref 13.0–17.0)
Immature Granulocytes: 1 %
Lymphocytes Relative: 32 %
Lymphs Abs: 2.4 10*3/uL (ref 0.7–4.0)
MCH: 26.9 pg (ref 26.0–34.0)
MCHC: 31.9 g/dL (ref 30.0–36.0)
MCV: 84.5 fL (ref 80.0–100.0)
Monocytes Absolute: 1 10*3/uL (ref 0.1–1.0)
Monocytes Relative: 13 %
Neutro Abs: 3.8 10*3/uL (ref 1.7–7.7)
Neutrophils Relative %: 51 %
Platelets: 228 10*3/uL (ref 150–400)
RBC: 4.38 MIL/uL (ref 4.22–5.81)
RDW: 14.6 % (ref 11.5–15.5)
WBC: 7.5 10*3/uL (ref 4.0–10.5)
nRBC: 0 % (ref 0.0–0.2)

## 2021-07-02 LAB — BASIC METABOLIC PANEL
Anion gap: 6 (ref 5–15)
BUN: 23 mg/dL — ABNORMAL HIGH (ref 6–20)
CO2: 24 mmol/L (ref 22–32)
Calcium: 9.3 mg/dL (ref 8.9–10.3)
Chloride: 106 mmol/L (ref 98–111)
Creatinine, Ser: 1.44 mg/dL — ABNORMAL HIGH (ref 0.61–1.24)
GFR, Estimated: >60 mL/min (ref >60)
Glucose, Bld: 158 mg/dL — ABNORMAL HIGH (ref 70–99)
Potassium: 4.4 mmol/L (ref 3.5–5.1)
Sodium: 136 mmol/L (ref 135–145)

## 2021-07-02 LAB — PROTIME-INR
INR: 2.7 — ABNORMAL HIGH (ref 0.8–1.2)
Prothrombin Time: 28.6 seconds — ABNORMAL HIGH (ref 11.4–15.2)

## 2021-07-02 LAB — MAGNESIUM: Magnesium: 2 mg/dL (ref 1.7–2.4)

## 2021-07-02 MED ORDER — WARFARIN SODIUM 5 MG PO TABS
5.0000 mg | ORAL_TABLET | Freq: Once | ORAL | Status: AC
  Administered 2021-07-02: 5 mg via ORAL
  Filled 2021-07-02: qty 1

## 2021-07-02 NOTE — Progress Notes (Signed)
ANTICOAGULATION CONSULT NOTE - Follow Up Consult  Pharmacy Consult for Warfarin Indication: pulmonary embolus and DVT  Allergies  Allergen Reactions   Lisinopril-Hydrochlorothiazide Other (See Comments)    "Interfered with the pH level in the serum"   Patient Measurements: Height: 5\' 9"  (175.3 cm) Weight: (!) 198 kg (436 lb 8.2 oz) IBW/kg (Calculated) : 70.7 Heparin Dosing Weight: 113.6 kg  Assessment: 34 yoM admitted on 10/24 with SOB and chest pain, found to have bilateral PE with right heart strain.  He was recently admitted for pneumonia (10/8-10/10).  No prior to admission anticoagulation noted.  Pharmacy consulted to dose IV heparin and warfarin for long-term anticoagulation. D-dimer 2.43 (10/24). Baseline coags:  INR 1.1, PTT 27. S/p TPA 50mg  for PE - infused 10/24 1400-1603  Today, 07/02/2021: Warfarin bridge with heparin completed on 10/31 INR remains therapeutic at 2.7 Hgb stable at 11.8, Plts WNL No bleeding or infusion related issues reported   Goal of Therapy:  INR 2-3 Monitor platelets by anticoagulation protocol: Yes   Plan: Warfarin 5 mg at 1600 Daily  PT/INR, and CBC while on anticoagulation Monitor closely for signs/symptoms of bleeding  Current discharge dose recommendation as of 11/2: Warfarin 10mg  daily exc for 5mg  on Mon and Fri  , PharmD, BCPS, BCIDP Clinical Pharmacist 07/02/2021 8:20 AM

## 2021-07-02 NOTE — Progress Notes (Signed)
Physical Therapy Treatment Patient Details Name: William Moss MRN: [id number] DOB: 1986-02-13 Today's Date: 07/02/2021   History of Present Illness William Moss is a 35 year old male with PMH obesity, HTN, DMII who presented to the hospital with worsening shortness of breath at home.  He was recently hospitalized from 06/07/2021 until 06/09/2021 for community-acquired pneumonia.  He required oxygen during that hospitalization but was able to be weaned off prior to discharge.  After discharge he had been less active and less mobile.  On admission, he underwent CT angio chest which showed bilateral pulmonary emboli with evidence of right heart straining on CTA.  Echo was difficult for interpretation and could not elaborate on if presence of RV strain.  He was evaluated by IR and not considered a candidate for thrombectomy and therefore was started on tPA per pulmonology.    PT Comments    General Comments: AxO x 3 very pleasant just recently admitted for PNA now PE's works full time for [employer] [place] at [employer].  General Gait Details: pt no longer needed support from a walker.  Amb full unit at Supervision level on RA.   RA at rest was 92% and decreased to as low as 73% but with quick return to 88% ceasing activity then resuming.  Progressing slowly. Pt plans to return home and "take a few weeks off work".  SATURATION QUALIFICATIONS: (This note is used to comply with regulatory documentation for home oxygen)  Patient Saturations on Room Air at Rest = 92%  Patient Saturations on Room Air while Ambulating 115 feet  = 73% briefly with rebound to 88% when cease activity/Physical demend  Patient Saturations on 3 Liters of oxygen while Ambulating = 90%  Please briefly explain why patient needs home oxygen: pt requires supplemental oxygen to achieve therapeutic levels    Recommendations for follow up therapy are one component of a multi-disciplinary discharge planning process, led by  the attending physician.  Recommendations may be updated based on patient status, additional functional criteria and insurance authorization.  Follow Up Recommendations  Home health PT     Assistance Recommended at Discharge Intermittent Supervision/Assistance  Equipment Recommendations  None recommended by PT    Recommendations for Other Services       Precautions / Restrictions Precautions Precautions: Fall Precaution Comments: monitor sats     Mobility  Bed Mobility               General bed mobility comments: OOB in recliner on 3 lts nasal at 96% at rest with HR 82    Transfers Overall transfer level: Needs assistance Equipment used: None Transfers: Sit to/from [employer] Sit to Stand: Supervision Stand pivot transfers: Supervision         General transfer comment: pt self able to rise an no longer needs walker for support.    Ambulation/Gait Ambulation/Gait assistance: Supervision Gait Distance (Feet): 500 Feet Assistive device: None Gait Pattern/deviations: Step-through pattern Gait velocity: WNL   General Gait Details: pt no longer needed support from a walker.  Amb full unit at Supervision level on RA.   RA at rest was 92% and decreased to as low as 73% but with quick return to 88% ceasing activity then resuming.  Progressing slowly.   Stairs             Wheelchair Mobility    Modified Rankin (Stroke Patients Only)       Balance  Cognition Arousal/Alertness: Awake/alert Behavior During Therapy: WFL for tasks assessed/performed Overall Cognitive Status: Within Functional Limits for tasks assessed                                 General Comments: AxO x 3 very pleasant just recently admitted for PNA now PE's works full time for [employer] [place] at [employer] Comments        Pertinent  Vitals/Pain Pain Assessment: No/denies pain    Home Living                          Prior Function            PT Goals (current goals can now be found in the care plan section)      Frequency    Min 3X/week      PT Plan Current plan remains appropriate    Co-evaluation              AM-PAC PT "6 Clicks" Mobility   Outcome Measure  Help needed turning from your back to your side while in a flat bed without using bedrails?: None Help needed moving from lying on your back to sitting on the side of a flat bed without using bedrails?: None Help needed moving to and from a bed to a chair (including a wheelchair)?: None Help needed standing up from a chair using your arms (e.g., wheelchair or bedside chair)?: None Help needed to walk in hospital room?: A Little Help needed climbing 3-5 steps with a railing? : A Little 6 Click Score: 22    End of Session Equipment Utilized During Treatment: Gait belt;Oxygen Activity Tolerance: Patient tolerated treatment well Patient left: in chair;with call bell/phone within reach;with nursing/sitter in room Nurse Communication: Mobility status PT Visit Diagnosis: Unsteadiness on feet (R26.81);Difficulty in walking, not elsewhere classified (R26.2)     Time: 1205-1222 PT Time Calculation (min) (ACUTE ONLY): 17 min  Charges:  $Gait Training: 8-22 mins                     Felecia Shelling  PTA Acute  Rehabilitation Services Pager      [phone] Office      [phone]

## 2021-07-02 NOTE — TOC Progression Note (Signed)
Transition of Care [facility]) - Progression Note    Patient Details  Name: LARRIE FRAIZER MRN: [id number] Date of Birth: [birth year]  Transition of Care [facility]) CM/SW Contact  Golda Acre, RN Phone Number: 07/02/2021, 3:14 PM  Clinical Narrative:    Home o2 ordered through [employer], parameters are in the p.t. note for today.   Expected Discharge Plan: Home/Self Care Barriers to Discharge: Continued Medical Work up  Expected Discharge Plan and Services Expected Discharge Plan: Home/Self Care   Discharge Planning Services: CM Consult   Living arrangements for the past 2 months: Single Family Home                 DME Arranged: Oxygen DME Agency: [employer] Date DME Agency Contacted: 07/02/21 Time DME Agency Contacted: [phone] Representative spoke with at DME Agency: genevieve             Social Determinants of Health (SDOH) Interventions    Readmission Risk Interventions Readmission Risk Prevention Plan 06/24/2021  Transportation Screening Complete  PCP or Specialist Appt within 5-7 Days Complete  Home Care Screening Complete  Medication Review (RN CM) Complete  Some recent data might be hidden

## 2021-07-02 NOTE — Plan of Care (Signed)

## 2021-07-02 NOTE — Progress Notes (Addendum)
PROGRESS NOTE    William Moss  [id number] DOB: 10-11-1985 DOA: 06/23/2021 PCP: Charlott Rakes, MD    Chief Complaint  Patient presents with   Shortness of Breath    Brief Narrative:   35 year old male with prior history of morbid obesity, hypertension, type 2 diabetes mellitus presents for shortness of breath.  Patient's reports He was recently hospitalized last month for community-acquired pneumonia. CT angiogram of the chest shows bilateral pulmonary embolism with evidence of right heart strain.  Echocardiogram ordered but difficult for interpretation, .  IR consulted and not considered a candidate for thrombectomy.  PCCM consulted and he was started on tPA per pulmonology. Patient currently on Coumadin for anticoagulation and INR is therapeutic.  He continues to require up to 3 L of nasal cannula oxygen.  Plan to wean him off oxygen in the next 1 to 2 days prior to discharge.  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  Most likely secondary to decreased mobility at home after discharge. Family history of PE present Patient underwent tPA infusion on 06/23/2021 followed by IV heparin infusion. .  Stroke was started on Coumadin with heparin bridge and currently INR is therapeutic. In addition to bilateral.  Patient was also found to have a DVT in the left femoral and popliteal vein.    Acute respiratory failure with hypoxia probably secondary to bilateral PE  Improving and currently requiring up to 3 L of nasal cannula oxygen.  Plan is to remain him off oxygen prior to discharge.   Morbid obesity Complicates overall prognosis. BMI is  around 66.7     Type 2 diabetes mellitus with stage III CKD A1c is 10.8% Continue with Soliqua and sliding scale insulin.    Hypomagnesemia Replaced.    Essential hypertension Blood pressure parameters appear to be optimal, continue with Coreg and hydralazine.    Normocytic anemia Hemoglobin stable around 11  continue to monitor.   DVT prophylaxis: (Coumadin) Code Status: (Full code Family Communication: None at bedside Disposition:   Status is: Inpatient  Remains inpatient appropriate because: Plan to wean him off oxygen in the next 24 to 48 hours       Consultants:    Procedures: None  Antimicrobials: None  Subjective: patient reports that breathing is the same as yesterday currently on CPAP  Objective: Vitals:   07/01/21 1414 07/01/21 2046 07/02/21 0525 07/02/21 0528  BP: 113/76 (!) 115/94 124/76 124/76  Pulse:  85  74  Resp:  16  18  Temp:  99 F (37.2 C)  98.1 F (36.7 C)  TempSrc:  Oral  Oral  SpO2:  97%  98%  Weight:    (!) 198 kg  Height:        Intake/Output Summary (Last 24 hours) at 07/02/2021 1115 Last data filed at 07/02/2021 0955 Gross per 24 hour  Intake --  Output 1175 ml  Net -1175 ml   Filed Weights   06/30/21 0500 07/01/21 0600 07/02/21 0528  Weight: (!) 198 kg (!) 196 kg (!) 198 kg    Examination:  General exam: Appears calm and comfortable  Respiratory system: Diminished air entry at bases on  3 L of nasal cannula oxygen Cardiovascular system: S1 & S2 heard, RRR. No JVD,  No pedal edema. Gastrointestinal system: Abdomen is nondistended, soft and nontender. Normal bowel sounds heard. Central nervous system: Alert and oriented. No focal neurological deficits. Extremities: Symmetric 5 x 5 power. Skin: No rashes, lesions or ulcers Psychiatry: . Mood & affect appropriate.     Data Reviewed: I have personally reviewed following labs and imaging studies  CBC: Recent Labs  Lab 06/28/21 0441 06/29/21 0426  06/30/21 0414 07/01/21 0405 07/02/21 0534  WBC 6.8 6.9 7.9 7.0 7.5  NEUTROABS 3.3 3.3 4.0 3.8 3.8  HGB 11.0* 10.9* 11.3* 11.6* 11.8*  HCT 34.9* 34.3* 36.3* 36.6* 37.0*  MCV 84.3 84.1 84.6 84.5 84.5  PLT 193 201 220 216 [id number]    Basic Metabolic Panel: Recent Labs  Lab 06/28/21 0441 06/29/21 0426 06/30/21 0414 07/01/21 0405 07/02/21 0534  NA 134* 134* 134* 135 136  K 4.0 4.3 4.6 4.6 4.4  CL 105 106 104 105 106  CO2 21* 21* 23 24 24   GLUCOSE 220* 260* 203* 197* 158*  BUN 27* 22* 24* 22* 23*  CREATININE 1.31* 0.95 1.31* 1.33* 1.44*  CALCIUM 8.7* 8.9 9.2 9.1 9.3  MG 2.1 2.0 2.0 1.9 2.0    GFR: Estimated Creatinine Clearance: 120.8 mL/min (A) (by C-G formula based on SCr of 1.44 mg/dL (H)).  Liver Function Tests: No results for input(s): AST, ALT, ALKPHOS, BILITOT, PROT, ALBUMIN in the last 168 hours.  CBG: Recent Labs  Lab 07/01/21 0735 07/01/21 1156 07/01/21 1610 07/01/21 2043 07/02/21 0755  GLUCAP 195* 297* 281* 222* 175*     Recent Results (from the past 240 hour(s))  Resp Panel by RT-PCR (Flu A&B, Covid) Nasopharyngeal Swab     Status: None   Collection Time: 06/23/21 12:38 AM   Specimen: Nasopharyngeal Swab; Nasopharyngeal(NP) swabs in vial transport medium  Result Value Ref Range Status   SARS Coronavirus 2 by RT PCR NEGATIVE NEGATIVE Final    Comment: (NOTE) SARS-CoV-2 target nucleic acids are NOT DETECTED.  The SARS-CoV-2 RNA is generally detectable in upper respiratory specimens during the acute phase of infection. The lowest concentration of SARS-CoV-2 viral copies this assay can detect is 138 copies/mL. A negative result does not preclude SARS-Cov-2 infection and should not be used as the sole basis for treatment or other patient management decisions. A negative result may occur with  improper specimen collection/handling, submission of specimen other than nasopharyngeal swab, presence of viral mutation(s) within the areas targeted by this assay,  and inadequate number of viral copies(<138 copies/mL). A negative result must be combined with clinical observations, patient history, and epidemiological information. The expected result is Negative.  Fact Sheet for Patients:  EntrepreneurPulse.com.au  Fact Sheet for Healthcare Providers:  IncredibleEmployment.be  This test is no t yet approved or cleared by the [place] FDA and  has been authorized for detection and/or diagnosis of SARS-CoV-2 by FDA under an Emergency Use Authorization (EUA). This EUA will remain  in effect (meaning this test can be used) for the duration of the COVID-19 declaration under Section 564(b)(1) of the Act, 21 U.S.C.section 360bbb-3(b)(1), unless the authorization is terminated  or revoked sooner.       Influenza A by PCR NEGATIVE NEGATIVE Final   Influenza B by PCR NEGATIVE NEGATIVE Final    Comment: (NOTE) The Xpert Xpress SARS-CoV-2/FLU/RSV plus assay is intended as an aid in the diagnosis of influenza from Nasopharyngeal swab specimens and should not be  used as a sole basis for treatment. Nasal washings and aspirates are unacceptable for Xpert Xpress SARS-CoV-2/FLU/RSV testing.  Fact Sheet for Patients: BloggerCourse.com  Fact Sheet for Healthcare Providers: SeriousBroker.it  This test is not yet approved or cleared by the [place] FDA and has been authorized for detection and/or diagnosis of SARS-CoV-2 by FDA under an Emergency Use Authorization (EUA). This EUA will remain in effect (meaning this test can be used) for the duration of the COVID-19 declaration under Section 564(b)(1) of the Act, 21 U.S.C. section 360bbb-3(b)(1), unless the authorization is terminated or revoked.  Performed at [facility], [address] [place], [place] [address]   MRSA Next Gen by PCR, Nasal     Status: None   Collection Time: 06/23/21   4:53 AM   Specimen: Nasal Mucosa; Nasal Swab  Result Value Ref Range Status   MRSA by PCR Next Gen NOT DETECTED NOT DETECTED Final    Comment: (NOTE) The GeneXpert MRSA Assay (FDA approved for NASAL specimens only), is one component of a comprehensive MRSA colonization surveillance program. It is not intended to diagnose MRSA infection nor to guide or monitor treatment for MRSA infections. Test performance is not FDA approved in patients less than 27 years old. Performed at [facility], [address], [place], [place] [address]          Radiology Studies: No results found.      Scheduled Meds:  amLODipine  10 mg Oral Daily   atorvastatin  40 mg Oral Daily   carvedilol  25 mg Oral BID WC   chlorhexidine  15 mL Mouth Rinse BID   Chlorhexidine Gluconate Cloth  6 each Topical Daily   hydrALAZINE  50 mg Oral Q8H   insulin aspart  0-20 Units Subcutaneous TID WC   insulin aspart  0-5 Units Subcutaneous QHS   insulin aspart  10 Units Subcutaneous TID PC   insulin detemir  25 Units Subcutaneous Daily   mouth rinse  15 mL Mouth Rinse BID   mouth rinse  15 mL Mouth Rinse q12n4p   sodium chloride flush  10-40 mL Intracatheter Q12H   warfarin  5 mg Oral ONCE-1600   Warfarin - Pharmacist Dosing Inpatient   Does not apply q1600   Continuous Infusions:  sodium chloride Stopped (06/25/21 1918)     LOS: 9 days        Kathlen Mody, MD [facility]   To contact the attending provider between 7A-7P or the covering provider during after hours 7P-7A, please log into the web site [email or web address] and access using universal [place] password for that web site. If you do not have the password, please call the hospital operator.  07/02/2021, 11:15 AM

## 2021-07-03 LAB — CBC WITH DIFFERENTIAL/PLATELET
Abs Immature Granulocytes: 0.06 10*3/uL (ref 0.00–0.07)
Basophils Absolute: 0 10*3/uL (ref 0.0–0.1)
Basophils Relative: 1 %
Eosinophils Absolute: 0.3 10*3/uL (ref 0.0–0.5)
Eosinophils Relative: 4 %
HCT: 37.1 % — ABNORMAL LOW (ref 39.0–52.0)
Hemoglobin: 11.7 g/dL — ABNORMAL LOW (ref 13.0–17.0)
Immature Granulocytes: 1 %
Lymphocytes Relative: 32 %
Lymphs Abs: 2.6 10*3/uL (ref 0.7–4.0)
MCH: 26.5 pg (ref 26.0–34.0)
MCHC: 31.5 g/dL (ref 30.0–36.0)
MCV: 84.1 fL (ref 80.0–100.0)
Monocytes Absolute: 1 10*3/uL (ref 0.1–1.0)
Monocytes Relative: 13 %
Neutro Abs: 4 10*3/uL (ref 1.7–7.7)
Neutrophils Relative %: 49 %
Platelets: 244 10*3/uL (ref 150–400)
RBC: 4.41 MIL/uL (ref 4.22–5.81)
RDW: 14.5 % (ref 11.5–15.5)
WBC: 8 10*3/uL (ref 4.0–10.5)
nRBC: 0 % (ref 0.0–0.2)

## 2021-07-03 LAB — BASIC METABOLIC PANEL
Anion gap: 7 (ref 5–15)
BUN: 28 mg/dL — ABNORMAL HIGH (ref 6–20)
CO2: 21 mmol/L — ABNORMAL LOW (ref 22–32)
Calcium: 8.9 mg/dL (ref 8.9–10.3)
Chloride: 104 mmol/L (ref 98–111)
Creatinine, Ser: 1.39 mg/dL — ABNORMAL HIGH (ref 0.61–1.24)
GFR, Estimated: >60 mL/min (ref >60)
Glucose, Bld: 194 mg/dL — ABNORMAL HIGH (ref 70–99)
Potassium: 4.4 mmol/L (ref 3.5–5.1)
Sodium: 132 mmol/L — ABNORMAL LOW (ref 135–145)

## 2021-07-03 LAB — GLUCOSE, CAPILLARY
Glucose-Capillary: 178 mg/dL — ABNORMAL HIGH (ref 70–99)
Glucose-Capillary: 265 mg/dL — ABNORMAL HIGH (ref 70–99)
Glucose-Capillary: 204 mg/dL — ABNORMAL HIGH (ref 70–99)

## 2021-07-03 LAB — PROTIME-INR
INR: 2.6 — ABNORMAL HIGH (ref 0.8–1.2)
Prothrombin Time: 27.8 seconds — ABNORMAL HIGH (ref 11.4–15.2)

## 2021-07-03 LAB — MAGNESIUM: Magnesium: 2 mg/dL (ref 1.7–2.4)

## 2021-07-03 MED ORDER — HYDRALAZINE HCL 50 MG PO TABS: 50.0000 mg | ORAL_TABLET | Freq: Three times a day (TID) | ORAL | 1 refills | Status: DC

## 2021-07-03 MED ORDER — WARFARIN SODIUM 10 MG PO TABS: ORAL_TABLET | ORAL | 2 refills | Status: DC

## 2021-07-03 MED ORDER — INSULIN DETEMIR 100 UNIT/ML ~~LOC~~ SOLN: 25.0000 [IU] | Freq: Every day | SUBCUTANEOUS | 11 refills | Status: DC

## 2021-07-03 MED ORDER — WARFARIN SODIUM 5 MG PO TABS
10.0000 mg | ORAL_TABLET | Freq: Once | ORAL | Status: AC
  Administered 2021-07-03: 10 mg via ORAL
  Filled 2021-07-03: qty 2

## 2021-07-03 MED ORDER — INSULIN ASPART 100 UNIT/ML IJ SOLN
10.0000 [IU] | Freq: Three times a day (TID) | INTRAMUSCULAR | 11 refills | Status: DC
Start: 2021-07-03 — End: 2021-11-24

## 2021-07-03 NOTE — Discharge Summary (Signed)
Physician Discharge Summary  William Moss MRN:[id number] DOB: 05/04/1986 DOA: 06/23/2021  PCP: Newlin, Enobong, MD  Admit date: 06/23/2021 Discharge date: 07/03/2021  Admitted From: Home.  Disposition:  Home with home oxygen.   Recommendations for Outpatient Follow-up:  Follow up with PCP in 1-2 weeks Please obtain BMP/CBC in one week Please follow up with pulmonologist in one week.   Discharge Condition:stable.  CODE STATUS:full code Diet recommendation: Heart Healthy   Brief/Interim Summary: 35-year-old male prior history of morbid obesity, hypertension, type 2 diabetes mellitus presents for shortness of breath.  Patient's reports He was recently hospitalized last month for community-acquired pneumonia. CT angiogram of the chest shows bilateral pulmonary embolism with evidence of right heart strain.  Echocardiogram ordered but difficult for interpretation, .  IR consulted and not considered a candidate for thrombectomy.  PCCM consulted and he was started on tPA per pulmonology. Patient currently on Coumadin for anticoagulation and INR is therapeutic.  He continues to require up to 3 L of nasal cannula oxygen.  Plan to wean him off oxygen in the next 1 to 2 days prior to discharge.  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  Most likely secondary to decreased mobility at home after discharge. Family history of PE present Patient underwent tPA infusion on 06/23/2021 followed by IV heparin infusion. .  Stroke was started on Coumadin with heparin bridge and currently INR is therapeutic. In addition to bilateral.  Patient was also found to have a DVT in  the left femoral and popliteal vein.       Acute respiratory failure with hypoxia probably secondary to bilateral PE   Improving and currently requiring up to 3 L of nasal cannula oxygen.      Morbid obesity Complicates overall prognosis. BMI is around 66.7         Type 2 diabetes mellitus with stage III CKD A1c is 10.8% Continue with Soliqua and sliding scale insulin.       Hypomagnesemia Replaced.       Essential hypertension Blood pressure parameters appear to be optimal, continue with Coreg and hydralazine.       Anemia of chronic disease.  Hemoglobin stable around 11  continue to monitor.  Discharge Instructions  Discharge Instructions     Diet - low sodium heart healthy   Complete by: As directed    Discharge instructions   Complete by: As directed    Please follow up with PCP in one week.   Increase activity slowly   Complete by: As directed    No wound care   Complete by: As directed       Allergies as of 07/03/2021       Reactions   Lisinopril-hydrochlorothiazide Other (See Comments)   "Interfered with the pH level in the serum"        Medication List     STOP taking these medications    aspirin 325 MG EC tablet   Byetta 5 MCG Pen 5 MCG/0.02ML Sopn injection Generic drug: exenatide   hydrochlorothiazide 12.5 MG capsule Commonly known as: Microzide   Levemir FlexTouch 100 UNIT/ML FlexPen Generic drug: insulin detemir Replaced by: insulin detemir 100 UNIT/ML injection         TAKE these medications    amLODipine 10 MG tablet Commonly known as: NORVASC Take 1 tablet (10 mg total) by mouth daily.   atorvastatin 40 MG tablet Commonly known as: LIPITOR Take 1 tablet (40 mg total) by mouth daily.   BD Pen Needle Nano U/F 32G X 4 MM Misc Generic drug: Insulin Pen Needle 10 Units by Does not apply route daily.   carvedilol 25 MG tablet Commonly known as: COREG Take 1 tablet (25 mg total) by mouth 2 (two) times daily.    hydrALAZINE 50 MG tablet Commonly known as: APRESOLINE Take 1 tablet (50 mg total) by mouth every 8 (eight) hours.   insulin aspart 100 UNIT/ML injection Commonly known as: novoLOG Inject 10 Units into the skin 3 (three) times daily after meals.   insulin detemir 100 UNIT/ML injection Commonly known as: LEVEMIR Inject 0.25 mLs (25 Units total) into the skin daily. Start taking on: July 04, 2021 Replaces: Levemir FlexTouch 100 UNIT/ML FlexPen   Ipratropium-Albuterol 20-100 MCG/ACT Aers respimat Commonly known as: COMBIVENT Inhale 1 puff into the lungs 4 (four) times daily.   metFORMIN 500 MG tablet Commonly known as: GLUCOPHAGE Take 2 tablets (1,000 mg total) by mouth 2 (two) times daily with a meal.   OneTouch Delica Plus Lancet33G Misc CHECK BLOOD SUGAR 3 TIMES A DAY   OneTouch Verio Reflect w/Device Kit Check blood sugar TID E11.69   warfarin 10 MG tablet Commonly known as: COUMADIN Take warfarin 10 mg daily except on Monday and Friday, take only 5 mg .               Durable Medical Equipment  (From admission, onward)           Start     Ordered   07/02/21 1114  For home use only DME oxygen  Once       Question Answer Comment  Length of Need 12 Months   Mode or (Route) Nasal cannula   Liters per Minute 3   Frequency Continuous (stationary and portable oxygen unit needed)   Oxygen conserving device Yes   Oxygen delivery system Gas      07/02/21 1114            Allergies  Allergen Reactions   Lisinopril-Hydrochlorothiazide Other (See Comments)    "Interfered with the pH level in the serum"    Consultations: PCCM.  IR.    Procedures/Studies: DG Chest 2 View  Result Date: 06/09/2021 CLINICAL DATA:  Dyspnea. EXAM: CHEST - 2 VIEW COMPARISON:  June 07, 2001. FINDINGS: The heart size and mediastinal contours are within normal limits. Both lungs are clear. The visualized skeletal structures are unremarkable. IMPRESSION: No active  cardiopulmonary disease. Electronically Signed   By: James  Green Jr M.D.   On: 06/09/2021 11:46   DG Chest 2 View  Result Date: 06/07/2021 CLINICAL DATA:  Short of breath, cough and congestion for 5 days EXAM: CHEST - 2 VIEW COMPARISON:  06/27/2019 FINDINGS: Frontal and lateral views of the chest demonstrate an unremarkable cardiac silhouette. No acute airspace disease, effusion, or pneumothorax. No acute bony abnormalities. IMPRESSION: 1. No acute intrathoracic process. Electronically Signed   By: Michael  Brown M.D.   On: 06/07/2021 18:36   CT Angio Chest PE W and/or Wo Contrast  Result Date: 06/23/2021 CLINICAL DATA:  Concern for pulmonary embolism. EXAM: CT ANGIOGRAPHY CHEST WITH CONTRAST TECHNIQUE: Multidetector CT imaging of the chest was performed using the standard protocol during bolus administration of intravenous contrast. Multiplanar   CT image reconstructions and MIPs were obtained to evaluate the vascular anatomy. CONTRAST:  75mL OMNIPAQUE IOHEXOL 350 MG/ML SOLN COMPARISON:  Chest CT dated 06/07/2021 and radiograph dated 06/23/2021. FINDINGS: Cardiovascular: No cardiomegaly or pericardial effusion. Mild dilatation of the right heart chambers. The right ventricular lumen measures 5.3 cm and the left ventricular lumen measures 3.2 cm for a ratio of 1.7 consistent with right heart straining. The thoracic aorta is unremarkable. Evaluation of the pulmonary arteries is very limited due to severe respiratory motion artifact. There are however bilateral lobar pulmonary artery emboli extending from the central branches into the upper and lower lobe lobar branches. Mediastinum/Nodes: No hilar or mediastinal adenopathy. The esophagus is grossly unremarkable. No mediastinal fluid collection. Lungs/Pleura: Patchy areas of consolidative change involving the left lower lobe, lingula, left upper lobe and right perihilar region may represent multilobar. Pulmonary infarcts are less likely but not excluded. No  pleural effusion or pneumothorax. The central airways are patent. Upper Abdomen: Fatty liver. Musculoskeletal: No chest wall abnormality. No acute or significant osseous findings. Review of the MIP images confirms the above findings. IMPRESSION: 1. Bilateral lobar pulmonary artery emboli with CT evidence of right heart straining (RV/LV Ratio = 1.7) consistent with at least submassive (intermediate risk) PE. The presence of right heart strain has been associated with an increased risk of morbidity and mortality. 2. Bilateral pulmonary opacities may represent multilobar pneumonia. Pulmonary infarcts are less likely but not excluded. 3. Fatty liver. These results were called by telephone at the time of interpretation on 06/23/2021 at 2:23 am to provider JOHN MOLPUS , who verbally acknowledged these results. Electronically Signed   By: Arash  Radparvar M.D.   On: 06/23/2021 02:27   CT Angio Chest Pulmonary Embolism (PE) W or WO Contrast  Result Date: 06/07/2021 CLINICAL DATA:  Cough, short of breath for 5 days, positive D dimer EXAM: CT ANGIOGRAPHY CHEST WITH CONTRAST TECHNIQUE: Multidetector CT imaging of the chest was performed using the standard protocol during bolus administration of intravenous contrast. Multiplanar CT image reconstructions and MIPs were obtained to evaluate the vascular anatomy. CONTRAST:  80mL OMNIPAQUE IOHEXOL 350 MG/ML SOLN COMPARISON:  06/07/2021 FINDINGS: Cardiovascular: There is technically adequate opacification of the pulmonary vasculature. Evaluation is limited due to body habitus. There are no central or segmental pulmonary emboli. Subsegmental branches are difficult to evaluate due to body habitus. Mild cardiomegaly without pericardial effusion. No evidence of thoracic aortic aneurysm or dissection. Mediastinum/Nodes: No enlarged mediastinal, hilar, or axillary lymph nodes. Thyroid gland, trachea, and esophagus demonstrate no significant findings. Lungs/Pleura: Bilateral airspace  disease is identified, most pronounced in the upper lobes and left lower lobe, most consistent with multifocal pneumonia. No effusion or pneumothorax. Central airways are patent. Upper Abdomen: Marked hepatic steatosis. No acute upper abdominal finding. Musculoskeletal: No acute or destructive bony lesions. Reconstructed images demonstrate no additional findings. Review of the MIP images confirms the above findings. IMPRESSION: 1. No evidence of pulmonary embolus. 2. Multifocal bilateral airspace disease consistent with pneumonia. 3. Cardiomegaly. Electronically Signed   By: Michael  Brown M.D.   On: 06/07/2021 20:02   DG CHEST PORT 1 VIEW  Result Date: 06/24/2021 CLINICAL DATA:  Tachypnea. EXAM: PORTABLE CHEST 1 VIEW COMPARISON:  June 23, 2021 FINDINGS: Study is limited secondary to positioning of the patient's head and neck. A right-sided PICC line is seen with its distal tip noted at the junction of the superior vena cava and right atrium. This represents a new finding when compared to the prior study.   There is mild, stable prominence of the perihilar pulmonary vasculature. There is no evidence of acute infiltrate, pleural effusion or pneumothorax. The heart size and mediastinal contours are within normal limits. The visualized skeletal structures are unremarkable. IMPRESSION: 1. Interval right-sided PICC line placement and positioning, as described above. 2. Mild pulmonary vascular congestion. Electronically Signed   By: Virgina Norfolk M.D.   On: 06/24/2021 03:37   DG Chest Port 1 View  Result Date: 06/23/2021 CLINICAL DATA:  Shortness of breath EXAM: PORTABLE CHEST 1 VIEW COMPARISON:  06/09/2021 FINDINGS: Patchy left lower lobe airspace disease. Right lung clear. Heart is normal size. No effusions or acute bony abnormality. IMPRESSION: Left lower lobe airspace disease compatible with pneumonia. Electronically Signed   By: Rolm Baptise M.D.   On: 06/23/2021 00:42   ECHOCARDIOGRAM  COMPLETE  Result Date: 06/23/2021    ECHOCARDIOGRAM REPORT   Patient Name:   William Moss Date of Exam: 06/23/2021 Medical Rec #:  [id number]     Height:       69.0 in Accession #:    [id number]    Weight:       452.0 lb Date of Birth:  02-Apr-1986    BSA:          2.922 m Patient Age:    35 years      BP:           158/106 mmHg Patient Gender: M             HR:           102 bpm. Exam Location:  Inpatient Procedure: 2D Echo, Color Doppler and Cardiac Doppler STAT ECHO Indications:    Pulmonary Embolus I26.09  History:        Patient has prior history of Echocardiogram examinations, most                 recent 06/08/2021. Signs/Symptoms:Shortness of Breath; Risk                 Factors:Hypertension and Diabetes.  Sonographer:    Darlina Sicilian RDCS Referring Phys: [id number] SENDIL K [facility]  Sonographer Comments: Suboptimal parasternal window, suboptimal apical window and suboptimal subcostal window. Image acquisition challenging due to respiratory motion, patient is currently using BiPap. IMPRESSIONS  1. Left ventricular ejection fraction, by estimation, is 60 to 65%. The left ventricle has normal function. The left ventricle has no regional wall motion abnormalities. Left ventricular diastolic parameters were normal.  2. Right ventricular systolic function was not well visualized. The right ventricular size is not well visualized.  3. The mitral valve was not well visualized. No evidence of mitral valve regurgitation.  4. The aortic valve was not well visualized. Aortic valve regurgitation is not visualized. No aortic stenosis is present. FINDINGS  Left Ventricle: Left ventricular ejection fraction, by estimation, is 60 to 65%. The left ventricle has normal function. The left ventricle has no regional wall motion abnormalities. The left ventricular internal cavity size was normal in size. Left ventricular diastolic parameters were normal. Right Ventricle: The right ventricular size is not well visualized. Right  vetricular wall thickness was not assessed. Right ventricular systolic function was not well visualized. Left Atrium: Left atrial size was normal in size. Right Atrium: Right atrial size was not well visualized. Pericardium: The pericardium was not well visualized. Mitral Valve: The mitral valve was not well visualized. No evidence of mitral valve regurgitation. Tricuspid Valve: The tricuspid valve is not well visualized. Tricuspid valve regurgitation  is not demonstrated. Aortic Valve: The aortic valve was not well visualized. Aortic valve regurgitation is not visualized. No aortic stenosis is present. Pulmonic Valve: The pulmonic valve was not well visualized. Pulmonic valve regurgitation is not visualized. Aorta: Aortic root could not be assessed. IAS/Shunts: The interatrial septum was not well visualized.   Diastology LV e' medial:    10.70 cm/s LV E/e' medial:  4.9 LV e' lateral:   9.90 cm/s LV E/e' lateral: 5.3  LEFT ATRIUM             Index LA Vol (A2C):   25.4 ml 8.69 ml/m LA Vol (A4C):   27.2 ml 9.31 ml/m LA Biplane Vol: 27.2 ml 9.31 ml/m  AORTIC VALVE LVOT Vmax:   83.70 cm/s LVOT Vmean:  56.600 cm/s LVOT VTI:    0.102 m MITRAL VALVE MV Area (PHT): 2.75 cm    SHUNTS MV Decel Time: 276 msec    Systemic VTI: 0.10 m MV E velocity: 52.00 cm/s MV A velocity: 74.10 cm/s MV E/A ratio:  0.70 Philip Nahser MD Electronically signed by Philip Nahser MD Signature Date/Time: 06/23/2021/11:29:51 AM    Final    ECHOCARDIOGRAM COMPLETE  Result Date: 06/08/2021    ECHOCARDIOGRAM REPORT   Patient Name:   William Moss Date of Exam: 06/08/2021 Medical Rec #:  [id number]   Height:       69.0 in Accession #:    [id number]  Weight:       359.3 lb Date of Birth:  08/21/1986  BSA:          2.650 m Patient Age:    34 years    BP:           151/101 mmHg Patient Gender: M           HR:           85 bpm. Exam Location:  Inpatient Procedure: 2D Echo, Cardiac Doppler, Color Doppler and Intracardiac            Opacification Agent  Indications:    CHF  History:        Patient has no prior history of Echocardiogram examinations.                 Risk Factors:Morbid Obesity.  Sonographer:    Rachel Lane RDCS Referring Phys: [id number] BINAYA DAHAL IMPRESSIONS  1. Left ventricular ejection fraction, by estimation, is 70 to 75%. The left ventricle has hyperdynamic function. The left ventricle has no regional wall motion abnormalities. There is mild left ventricular hypertrophy. Left ventricular diastolic parameters are indeterminate.  2. Right ventricular systolic function was not well visualized. The right ventricular size is not well visualized.  3. The mitral valve is grossly normal. No evidence of mitral valve regurgitation. No evidence of mitral stenosis.  4. The aortic valve was not well visualized. Aortic valve regurgitation is not visualized. No aortic stenosis is present. FINDINGS  Left Ventricle: Left ventricular ejection fraction, by estimation, is 70 to 75%. The left ventricle has hyperdynamic function. The left ventricle has no regional wall motion abnormalities. Definity contrast agent was given IV to delineate the left ventricular endocardial borders. The left ventricular internal cavity size was normal in size. There is mild left ventricular hypertrophy. Left ventricular diastolic parameters are indeterminate. Right Ventricle: The right ventricular size is not well visualized. Right vetricular wall thickness was not well visualized. Right ventricular systolic function was not well visualized. Left Atrium: Left atrial size was not well visualized. Right   Atrium: Right atrial size was not well visualized. Pericardium: There is no evidence of pericardial effusion. Mitral Valve: The mitral valve is grossly normal. No evidence of mitral valve regurgitation. No evidence of mitral valve stenosis. Tricuspid Valve: The tricuspid valve is grossly normal. Tricuspid valve regurgitation is not demonstrated. Aortic Valve: The aortic valve was not  well visualized. Aortic valve regurgitation is not visualized. No aortic stenosis is present. Aortic valve mean gradient measures 5.0 mmHg. Aortic valve peak gradient measures 8.2 mmHg. Aortic valve area, by VTI measures 5.07 cm. Pulmonic Valve: The pulmonic valve was not well visualized. Pulmonic valve regurgitation is not visualized. Aorta: The aortic root is normal in size and structure. IAS/Shunts: The interatrial septum was not well visualized.  LEFT VENTRICLE PLAX 2D LVIDd:         4.70 cm   Diastology LVIDs:         3.50 cm   LV e' medial:    7.29 cm/s LV PW:         1.20 cm   LV E/e' medial:  9.8 LV IVS:        1.10 cm   LV e' lateral:   8.59 cm/s LVOT diam:     2.60 cm   LV E/e' lateral: 8.3 LV SV:         133 LV SV Index:   50 LVOT Area:     5.31 cm  LEFT ATRIUM           Index        RIGHT ATRIUM           Index LA diam:      3.90 cm 1.47 cm/m   RA Area:     17.80 cm LA Vol (A2C): 54.0 ml 20.38 ml/m  RA Volume:   53.20 ml  20.07 ml/m  AORTIC VALVE AV Area (Vmax):    5.24 cm AV Area (Vmean):   5.10 cm AV Area (VTI):     5.07 cm AV Vmax:           143.00 cm/s AV Vmean:          98.400 cm/s AV VTI:            0.262 m AV Peak Grad:      8.2 mmHg AV Mean Grad:      5.0 mmHg LVOT Vmax:         141.00 cm/s LVOT Vmean:        94.600 cm/s LVOT VTI:          0.250 m LVOT/AV VTI ratio: 0.95  AORTA Ao Root diam: 3.60 cm MITRAL VALVE MV Area (PHT): 3.99 cm    SHUNTS MV Decel Time: 190 msec    Systemic VTI:  0.25 m MV E velocity: 71.10 cm/s  Systemic Diam: 2.60 cm MV A velocity: 84.20 cm/s MV E/A ratio:  0.84 Oswaldo Milian MD Electronically signed by Oswaldo Milian MD Signature Date/Time: 06/08/2021/1:59:27 PM    Final    VAS [place] LOWER EXTREMITY VENOUS (DVT)  Result Date: 06/24/2021  Lower Venous DVT Study Patient Name:  William Moss  Date of Exam:   06/24/2021 Medical Rec #: [id number]      Accession #:    [id number] Date of Birth: Nov 10, 1985     Patient Gender: M Patient Age:   57 years Exam  Location:  [facility] Procedure:      VAS [place] LOWER EXTREMITY VENOUS (DVT) Referring Phys: Dwyane Dee --------------------------------------------------------------------------------  Indications: Pulmonary embolism.  Risk Factors: Confirmed PE. Anticoagulation: Heparin. Limitations: Body habitus and poor ultrasound/tissue interface. Comparison Study: No prior studies. Performing Technologist: Oliver Hum RVT  Examination Guidelines: A complete evaluation includes B-mode imaging, spectral Doppler, color Doppler, and power Doppler as needed of all accessible portions of each vessel. Bilateral testing is considered an integral part of a complete examination. Limited examinations for reoccurring indications may be performed as noted. The reflux portion of the exam is performed with the patient in reverse Trendelenburg.  +---------+---------------+---------+-----------+----------+--------------+ RIGHT    CompressibilityPhasicitySpontaneityPropertiesThrombus Aging +---------+---------------+---------+-----------+----------+--------------+ CFV      Full           Yes      Yes                                 +---------+---------------+---------+-----------+----------+--------------+ SFJ      Full                                                        +---------+---------------+---------+-----------+----------+--------------+ FV Prox  Full                                                        +---------+---------------+---------+-----------+----------+--------------+ FV Mid   Full                                                        +---------+---------------+---------+-----------+----------+--------------+ FV DistalFull                                                        +---------+---------------+---------+-----------+----------+--------------+ PFV      Full                                                         +---------+---------------+---------+-----------+----------+--------------+ POP      Full           Yes      Yes                                 +---------+---------------+---------+-----------+----------+--------------+ PTV      Full                                                        +---------+---------------+---------+-----------+----------+--------------+ PERO     Full                                                        +---------+---------------+---------+-----------+----------+--------------+   +---------+---------------+---------+-----------+----------+--------------+  LEFT     CompressibilityPhasicitySpontaneityPropertiesThrombus Aging +---------+---------------+---------+-----------+----------+--------------+ CFV      Full           Yes      Yes                                 +---------+---------------+---------+-----------+----------+--------------+ SFJ      Full                                                        +---------+---------------+---------+-----------+----------+--------------+ FV Prox  Partial                                      Acute          +---------+---------------+---------+-----------+----------+--------------+ FV Mid                  Yes      Yes                                 +---------+---------------+---------+-----------+----------+--------------+ FV DistalPartial        Yes      Yes                  Acute          +---------+---------------+---------+-----------+----------+--------------+ PFV      Full                                                        +---------+---------------+---------+-----------+----------+--------------+ POP      Partial        Yes      Yes                  Acute          +---------+---------------+---------+-----------+----------+--------------+ PTV      Full                                                         +---------+---------------+---------+-----------+----------+--------------+ PERO     Full                                                        +---------+---------------+---------+-----------+----------+--------------+     Summary: RIGHT: - There is no evidence of deep vein thrombosis in the lower extremity. However, portions of this examination were limited- see technologist comments above.  - No cystic structure found in the popliteal fossa.  LEFT: - Findings consistent with acute deep vein thrombosis involving the left femoral vein, and left popliteal vein. - No cystic structure found in the popliteal fossa.  *See table(s) above for measurements and observations. Electronically signed by   Harold Barban MD on 06/24/2021 at 9:16:19 PM.    Final    [place] EKG SITE RITE  Result Date: 06/23/2021 If Site Rite image not attached, placement could not be confirmed due to current cardiac rhythm.  (Echo, Carotid, EGD, Colonoscopy, ERCP)    Subjective:   Discharge Exam: Vitals:   07/03/21 1005 07/03/21 1335  BP: (!) 144/87 121/73  Pulse:  82  Resp:  16  Temp:  98.4 F (36.9 C)  SpO2:  (!) 88%   Vitals:   07/03/21 0500 07/03/21 0537 07/03/21 1005 07/03/21 1335  BP:  131/69 (!) 144/87 121/73  Pulse:  73  82  Resp:  18  16  Temp:  97.9 F (36.6 C)  98.4 F (36.9 C)  TempSrc:  Oral  Oral  SpO2:  100%  (!) 88%  Weight: (!) 163.2 kg     Height:        General: Pt is alert, awake, not in acute distress Cardiovascular: RRR, S1/S2 +, no rubs, no gallops Respiratory: CTA bilaterally, no wheezing, no rhonchi Abdominal: Soft, NT, ND, bowel sounds + Extremities: no edema, no cyanosis    The results of significant diagnostics from this hospitalization (including imaging, microbiology, ancillary and laboratory) are listed below for reference.     Microbiology: No results found for this or any previous visit (from the past 240 hour(s)).   Labs: BNP (last 3 results) Recent Labs     06/07/21 1933 06/23/21 0029  BNP 26.6 32.9   Basic Metabolic Panel: Recent Labs  Lab 06/29/21 0426 06/30/21 0414 07/01/21 0405 07/02/21 0534 07/03/21 0406  NA 134* 134* 135 136 132*  K 4.3 4.6 4.6 4.4 4.4  CL 106 104 105 106 104  CO2 21* _0 21*  GLUCOSE 260* 203* 197* 158* 194*  BUN 22* 24* 22* 23* 28*  CREATININE 0.95 1.31* 1.33* 1.44* 1.39*  CALCIUM 8.9 9.2 9.1 9.3 8.9  MG 2.0 2.0 1.9 2.0 2.0   Liver Function Tests: No results for input(s): AST, ALT, ALKPHOS, BILITOT, PROT, ALBUMIN in the last 168 hours. No results for input(s): LIPASE, AMYLASE in the last 168 hours. No results for input(s): AMMONIA in the last 168 hours. CBC: Recent Labs  Lab 06/29/21 0426 06/30/21 0414 07/01/21 0405 07/02/21 0534 07/03/21 0406  WBC 6.9 7.9 7.0 7.5 8.0  NEUTROABS 3.3 4.0 3.8 3.8 4.0  HGB 10.9* 11.3* 11.6* 11.8* 11.7*  HCT 34.3* 36.3* 36.6* 37.0* 37.1*  MCV 84.1 84.6 84.5 84.5 84.1  PLT 201 220 216 228 244   Cardiac Enzymes: No results for input(s): CKTOTAL, CKMB, CKMBINDEX, TROPONINI in the last 168 hours. BNP: Invalid input(s): POCBNP CBG: Recent Labs  Lab 07/02/21 1146 07/02/21 1624 07/02/21 2111 07/03/21 0711 07/03/21 1214  GLUCAP 274* 231* 195* 178* 265*   D-Dimer No results for input(s): DDIMER in the last 72 hours. Hgb A1c No results for input(s): HGBA1C in the last 72 hours. Lipid Profile No results for input(s): CHOL, HDL, LDLCALC, TRIG, CHOLHDL, LDLDIRECT in the last 72 hours. Thyroid function studies No results for input(s): TSH, T4TOTAL, T3FREE, THYROIDAB in the last 72 hours.  Invalid input(s): FREET3 Anemia work up No results for input(s): VITAMINB12, FOLATE, FERRITIN, TIBC, IRON, RETICCTPCT in the last 72 hours. Urinalysis    Component Value Date/Time   COLORURINE STRAW (A) 01/18/2019 1840   APPEARANCEUR CLEAR 01/18/2019 1840   LABSPEC 1.021 01/18/2019 1840   PHURINE 5.0 01/18/2019 1840   GLUCOSEU >=500 (A) 01/18/2019 1840  HGBUR  NEGATIVE 01/18/2019 1840   BILIRUBINUR negative 10/07/2020 1057   KETONESUR negative 10/07/2020 1057   KETONESUR NEGATIVE 01/18/2019 1840   PROTEINUR NEGATIVE 01/18/2019 1840   UROBILINOGEN 0.2 10/07/2020 1057   NITRITE Negative 10/07/2020 1057   NITRITE NEGATIVE 01/18/2019 1840   LEUKOCYTESUR Negative 10/07/2020 1057   LEUKOCYTESUR NEGATIVE 01/18/2019 1840   Sepsis Labs Invalid input(s): PROCALCITONIN,  WBC,  LACTICIDVEN Microbiology No results found for this or any previous visit (from the past 240 hour(s)).   Time coordinating discharge:34 MINUTES.   SIGNED:   Vijaya Akula, MD  [facility]   

## 2021-07-03 NOTE — Progress Notes (Signed)
AVS reviewed with pt. PICC removed by IV nurse, tele d/c. Home oxygen delivered and sent home with patient. All questions answered for time being. Scharlene Corn, RN

## 2021-07-03 NOTE — Progress Notes (Signed)
SATURATION QUALIFICATIONS: (This note is used to comply with regulatory documentation for home oxygen)  Patient Saturations on Room Air at Rest = 93%  Patient Saturations on Room Air while Ambulating = 81%   Patient Saturations on 2 Liters of oxygen while Ambulating = 88%  Pt ambulated 360 feet  Please briefly explain why patient needs home oxygen: While ambulating, pt desaturates. On 2 L patient recovers to 94% saturation after 5 minutes.

## 2021-07-03 NOTE — Progress Notes (Addendum)
ANTICOAGULATION CONSULT NOTE - Follow Up Consult  Pharmacy Consult for Warfarin Indication: pulmonary embolus and DVT  Allergies  Allergen Reactions   Lisinopril-Hydrochlorothiazide Other (See Comments)    "Interfered with the pH level in the serum"   Patient Measurements: Height: 5\' 9"  (175.3 cm) Weight: (!) 163.2 kg (359 lb 12.7 oz) IBW/kg (Calculated) : 70.7 Heparin Dosing Weight: 113.6 kg  Assessment: 34 yoM admitted on 10/24 with SOB and chest pain, found to have bilateral PE with right heart strain.  He was recently admitted for pneumonia (10/8-10/10).  No prior to admission anticoagulation noted.  Pharmacy consulted to dose IV heparin and warfarin for long-term anticoagulation. D-dimer 2.43 (10/24). Baseline coags:  INR 1.1, PTT 27. S/p TPA 50mg  for PE - infused 10/24 1400-1603  Today, 07/03/2021: Warfarin bridge with heparin completed on 10/31 INR remains therapeutic at 2.6 Hgb stable at 11.7, Plts WNL No bleeding  issues reported   Goal of Therapy:  INR 2-3 Monitor platelets by anticoagulation protocol: Yes   Plan: Warfarin 10 mg at 1600 Daily  PT/INR, and CBC while on anticoagulation Monitor closely for signs/symptoms of bleeding  Current discharge dose recommendation as of 11/2: Warfarin 10mg  daily exc for 5mg  on Mon and Fri   , PharmD, BCPS 07/03/2021 9:44 AM

## 2021-07-03 NOTE — Progress Notes (Signed)
Pt expressed concern with medication cost during AVS review. Pt shown Rx phone app. When pt saw price of Coumadin, he stated he was no longer worried about the cost, but the Levemir was too expensive. RN educated pt on following-up with his primary provider at [facility] and [facility] for possible change in insulin. Pt stated his "insurance benefits will begin in a month" at his job. Pt completed teach-back of discharge overview of Coumadin dosage and plan for insulin. Pt acknowledged understanding of discharge instructions and stated he had no additional questions. Scharlene Corn, RN

## 2021-07-07 ENCOUNTER — Telehealth: Payer: Self-pay

## 2021-07-07 NOTE — Telephone Encounter (Signed)
Transition Care Management Unsuccessful Follow-up Telephone Call  Date of discharge and from where:  07/03/2021, [facility]   Attempts:  1st Attempt  Reason for unsuccessful TCM follow-up call:  Left voice message on # [phone], call back requested to this CM.  Patient has appointment with Dr Alvis Lemmings @ [facility] - 07/08/2021.

## 2021-07-08 ENCOUNTER — Other Ambulatory Visit: Payer: Self-pay

## 2021-07-08 ENCOUNTER — Ambulatory Visit: Payer: Self-pay | Attending: Family Medicine | Admitting: Family Medicine

## 2021-07-08 ENCOUNTER — Encounter: Payer: Self-pay | Admitting: Family Medicine

## 2021-07-08 ENCOUNTER — Telehealth: Payer: Self-pay

## 2021-07-08 VITALS — BP 157/85 | HR 80 | Ht 69.0 in | Wt 368.0 lb

## 2021-07-08 DIAGNOSIS — Z79899 Other long term (current) drug therapy: Secondary | ICD-10-CM

## 2021-07-08 DIAGNOSIS — J9601 Acute respiratory failure with hypoxia: Secondary | ICD-10-CM

## 2021-07-08 DIAGNOSIS — R5381 Other malaise: Secondary | ICD-10-CM

## 2021-07-08 DIAGNOSIS — M79601 Pain in right arm: Secondary | ICD-10-CM

## 2021-07-08 DIAGNOSIS — R040 Epistaxis: Secondary | ICD-10-CM

## 2021-07-08 DIAGNOSIS — I82412 Acute embolism and thrombosis of left femoral vein: Secondary | ICD-10-CM

## 2021-07-08 DIAGNOSIS — Z029 Encounter for administrative examinations, unspecified: Secondary | ICD-10-CM

## 2021-07-08 DIAGNOSIS — I82409 Acute embolism and thrombosis of unspecified deep veins of unspecified lower extremity: Secondary | ICD-10-CM | POA: Insufficient documentation

## 2021-07-08 DIAGNOSIS — I2609 Other pulmonary embolism with acute cor pulmonale: Secondary | ICD-10-CM

## 2021-07-08 LAB — POCT INR: INR: 4.2 — AB (ref 2.0–3.0)

## 2021-07-08 NOTE — Progress Notes (Signed)
Subjective:  Patient ID: William Moss, male    DOB: [birth year]  Age: 35 y.o. MRN: [id number]  CC: Hospitalization Follow-up   HPI William Moss is a 35 y.o. year old male with a history of hypertension, type 2 diabetes mellitus (A1c 13.4) , morbid obesity, GERD hospitalized for COVID-19 pneumonia in 06/2019 with recent hospitalization from 06/23/2021 through 07/03/2021 for acute pulmonary embolism here for transition of care visit. He had been previously hospitalized from 06/07/2021 through 06/09/2021 for acute respiratory failure secondary to community-acquired pneumonia and had subsequently represented again 2 weeks later with dyspnea.  CT chest revealed: IMPRESSION: 1. Bilateral lobar pulmonary artery emboli with CT evidence of right heart straining (RV/LV Ratio = 1.7) consistent with at least submassive (intermediate risk) PE. The presence of right heart strain has been associated with an increased risk of morbidity and mortality. 2. Bilateral pulmonary opacities may represent multilobar pneumonia. Pulmonary infarcts are less likely but not excluded. 3. Fatty liver.  Doppler of lower extremities revealed: Summary:  RIGHT:  - There is no evidence of deep vein thrombosis in the lower extremity.  However, portions of this examination were limited- see technologist  comments above.     - No cystic structure found in the popliteal fossa.     LEFT:  - Findings consistent with acute deep vein thrombosis involving the left  femoral vein, and left popliteal vein.  - No cystic structure found in the popliteal fossa.      Per notes he was not a candidate for thrombectomy.  He received tPA and was subsequently placed on Coumadin. Echo revealed EF of 60 to 65%, no regional wall motion abnormalities. INR at discharge was 2.6 on 07/03/2021.  Interval History:  He is on 3L Oxygen at home and he uses it on exertion.  He does continue to feel dyspneic but has no chest pain or pain in his  legs or leg swelling. He has been compliant with Coumadin 10 mg daily and 5 mg on Monday and Friday and INR is 4.2 today.  He does endorse presence of epistaxis when he blows his nose but this is absent at rest.  He endorses a positive FHx of thromboembolic disease in his Dad who developed PE after toe surgery and Dad later passed from the PE.  He does not know of history of thromboembolic disease and other family members. His blood pressure is elevated and he endorses taking his antihypertensive today. He noticed the medial aspect of his right arm is slightly tender and swollen.  He did have a PICC line superior to that.  He needs completion of paper work for short term disability and would like to be out until 07/24/2019 Past Medical History:  Diagnosis Date   Atypical chest pain 05/27/2016   a. 05/2016: NST showing small area of moderare anteroapical ischemia b. 05/2016: cath showing tortous but normal cors which confirmed a false positive NST.   Diabetes mellitus without complication (HCC)    GERD (gastroesophageal reflux disease) 10/02/2016   Hypertension     Past Surgical History:  Procedure Laterality Date   CARDIAC CATHETERIZATION N/A 06/10/2016   Procedure: Left Heart Cath and Coronary Angiography;  Surgeon: Wellington Hampshire, MD;  Location: [facility];  Service: Cardiovascular;  Laterality: N/A;   COLONOSCOPY N/A 02/04/2016   Procedure: COLONOSCOPY;  Surgeon: Irene Shipper, MD;  Location: [facility];  Service: Endoscopy;  Laterality: N/A;   NO PAST SURGERIES  Family History  Problem Relation Age of Onset   Diabetes Mother    Diabetes Father    Pulmonary embolism Father    Cancer Maternal Grandmother    Alzheimer's disease Paternal Grandmother    Breast cancer Cousin     Allergies  Allergen Reactions   Lisinopril-Hydrochlorothiazide Other (See Comments)    "Interfered with the pH level in the serum"    Outpatient Medications Prior to Visit  Medication Sig  Dispense Refill   atorvastatin (LIPITOR) 40 MG tablet Take 1 tablet (40 mg total) by mouth daily. 90 tablet 1   Blood Glucose Monitoring Suppl (ONETOUCH VERIO REFLECT) w/Device KIT Check blood sugar TID E11.69 1 kit 0   carvedilol (COREG) 25 MG tablet Take 1 tablet (25 mg total) by mouth 2 (two) times daily. 180 tablet 1   hydrALAZINE (APRESOLINE) 50 MG tablet Take 1 tablet (50 mg total) by mouth every 8 (eight) hours. 90 tablet 1   insulin aspart (NOVOLOG) 100 UNIT/ML injection Inject 10 Units into the skin 3 (three) times daily after meals. 10 mL 11   insulin detemir (LEVEMIR) 100 UNIT/ML injection Inject 0.25 mLs (25 Units total) into the skin daily. 10 mL 11   Insulin Pen Needle (BD PEN NEEDLE NANO U/F) 32G X 4 MM MISC 10 Units by Does not apply route daily. 100 each 3   Ipratropium-Albuterol (COMBIVENT) 20-100 MCG/ACT AERS respimat Inhale 1 puff into the lungs 4 (four) times daily. 4 g 0   Lancets (ONETOUCH DELICA PLUS [id number]) MISC CHECK BLOOD SUGAR 3 TIMES A DAY 100 each 2   metFORMIN (GLUCOPHAGE) 500 MG tablet Take 2 tablets (1,000 mg total) by mouth 2 (two) times daily with a meal. 360 tablet 1   warfarin (COUMADIN) 10 MG tablet Take warfarin 10 mg daily except on Monday and Friday, take only 5 mg . 30 tablet 2   amLODipine (NORVASC) 10 MG tablet Take 1 tablet (10 mg total) by mouth daily. 90 tablet 1   No facility-administered medications prior to visit.     ROS Review of Systems  Constitutional:  Negative for activity change and appetite change.  HENT:  Negative for sinus pressure and sore throat.   Eyes:  Negative for visual disturbance.  Respiratory:  Negative for cough, chest tightness and shortness of breath.   Cardiovascular:  Negative for chest pain and leg swelling.  Gastrointestinal:  Negative for abdominal distention, abdominal pain, constipation and diarrhea.  Endocrine: Negative.   Genitourinary:  Negative for dysuria.  Musculoskeletal:  Negative for joint swelling  and myalgias.  Skin:  Negative for rash.  Allergic/Immunologic: Negative.   Neurological:  Negative for weakness, light-headedness and numbness.  Psychiatric/Behavioral:  Negative for dysphoric mood and suicidal ideas.    Objective:  BP (!) 157/85   Pulse 80   Ht _0  (1.753 m)   Wt (!) 368 lb (166.9 kg)   SpO2 95%   BMI 54.34 kg/m   BP/Weight 07/08/2021 07/03/2021 27/10/5007  Systolic BP 381 829 937  Diastolic BP 85 73 92  Wt. (Lbs) 368 359.79 -  BMI 54.34 53.13 -      Physical Exam Constitutional:      Appearance: He is well-developed. He is obese.  Cardiovascular:     Rate and Rhythm: Normal rate.     Heart sounds: Normal heart sounds. No murmur heard. Pulmonary:     Effort: Pulmonary effort is normal.     Breath sounds: Normal breath sounds. No wheezing or rales.  Chest:     Chest wall: No tenderness.  Abdominal:     General: Bowel sounds are normal. There is no distension.     Palpations: Abdomen is soft. There is no mass.     Tenderness: There is no abdominal tenderness.  Musculoskeletal:        General: Normal range of motion.     Right lower leg: No edema.     Left lower leg: No edema.     Comments: Slight TTP of medial aspect of R cubital region, no erythema, no warmth  Neurological:     Mental Status: He is alert and oriented to person, place, and time.  Psychiatric:        Mood and Affect: Mood normal.    CMP Latest Ref Rng & Units 07/03/2021 07/02/2021 07/01/2021  Glucose 70 - 99 mg/dL 194(H) 158(H) 197(H)  BUN 6 - 20 mg/dL 28(H) 23(H) 22(H)  Creatinine 0.61 - 1.24 mg/dL 1.39(H) 1.44(H) 1.33(H)  Sodium 135 - 145 mmol/L 132(L) 136 135  Potassium 3.5 - 5.1 mmol/L 4.4 4.4 4.6  Chloride 98 - 111 mmol/L 104 106 105  CO2 22 - 32 mmol/L 21(L) 24 24  Calcium 8.9 - 10.3 mg/dL 8.9 9.3 9.1  Total Protein 6.5 - 8.1 g/dL - - -  Total Bilirubin 0.3 - 1.2 mg/dL - - -  Alkaline Phos 38 - 126 U/L - - -  AST 15 - 41 U/L - - -  ALT 0 - 44 U/L - - -    Lipid  Panel     Component Value Date/Time   CHOL 133 04/17/2021 1011   TRIG 118 04/17/2021 1011   HDL 43 04/17/2021 1011   CHOLHDL 3.1 04/17/2021 1011   CHOLHDL 3.5 06/29/2019 0005   VLDL 17 06/29/2019 0005   LDLCALC 69 04/17/2021 1011    CBC    Component Value Date/Time   WBC 8.0 07/03/2021 0406   RBC 4.41 07/03/2021 0406   HGB 11.7 (L) 07/03/2021 0406   HCT 37.1 (L) 07/03/2021 0406   PLT 244 07/03/2021 0406   MCV 84.1 07/03/2021 0406   MCH 26.5 07/03/2021 0406   MCHC 31.5 07/03/2021 0406   RDW 14.5 07/03/2021 0406   LYMPHSABS 2.6 07/03/2021 0406   MONOABS 1.0 07/03/2021 0406   EOSABS 0.3 07/03/2021 0406   BASOSABS 0.0 07/03/2021 0406    Lab Results  Component Value Date   HGBA1C 10.8 (H) 06/24/2021    Assessment & Plan:   Problem List Items Addressed This Visit       Cardiovascular and Mediastinum   Acute pulmonary embolism ([place])    Diagnosed in 05/2021 Status post tPA With superimposed acute respiratory failure currently on oxygen on exertion Currently on warfarin with supratherapeutic INR of 4.2 Holding Coumadin x3 days and he will have INR checked in 3 days See additional notes under DVT      Relevant Orders   INR (Completed)   Ambulatory referral to Hematology / Oncology   DVT (deep venous thrombosis) ([place])    Currently on anticoagulation with warfarin This was provoked by a previous hospital stay Questionable family history of thromboembolic disorder in his dad who incidentally had a PE which was provoked after surgery as well. Will refer to hematology to determine if prolonged anticoagulation is indicated after he has completed his 23-month course       Relevant Orders   Ambulatory referral to Hematology / Oncology     Respiratory   Acute respiratory  failure with hypoxia ([place]) - Primary    In the setting of PE Currently on 3 L of oxygen on exertion        Other   High risk medication use    Currently on warfarin Supratherapeutic INR of  4.2 Advised to hold warfarin x3 days and he will follow-up with the clinical pharmacist in 3 days for an INR check      Other Visit Diagnoses     Physical deconditioning     Advised that condition will improve gradually over time Activity as tolerated.    Administrative encounter     Completed FMLA paperwork    Pain of right upper extremity     No evidence of infection Advised to apply ice No antibiotic indicated If symptoms worsen advised to contact the clinic    Epistaxis     Likely from supratherapeutic INR This has improved Apply nasal pressure if this recurs notify the clinic if worsening.       We will address other chronic medical conditions at next visit. No orders of the defined types were placed in this encounter.   Follow-up: Return in about 3 days (around 07/11/2021) for INR with Lurena Joiner; 3 weeks PCP - Diabetes.       Charlott Rakes, MD, FAAFP. [facility] [place], [place]   07/08/2021, 6:29 PM

## 2021-07-08 NOTE — Assessment & Plan Note (Signed)
Currently on anticoagulation with warfarin from 06/27/2021 This was provoked by a previous hospital stay Questionable family history of thromboembolic disorder in his dad who incidentally had a PE which was provoked after surgery as well. Will refer to hematology to determine if prolonged anticoagulation is indicated after he has completed his 62-month course

## 2021-07-08 NOTE — Assessment & Plan Note (Signed)
Currently on warfarin Supratherapeutic INR of 4.2 Advised to hold warfarin x3 days and he will follow-up with the clinical pharmacist in 3 days for an INR check

## 2021-07-08 NOTE — Telephone Encounter (Signed)
Transition Care Management Follow-up Telephone Call Date of discharge and from where: 07/03/2021, [facility]  Patient has appointment with Dr Alvis Lemmings @ [facility] this morning

## 2021-07-08 NOTE — Assessment & Plan Note (Signed)
Diagnosed in 05/2021 Status post tPA With superimposed acute respiratory failure currently on oxygen on exertion Currently on warfarin with supratherapeutic INR of 4.2 Holding Coumadin x3 days and he will have INR checked in 3 days See additional notes under DVT

## 2021-07-08 NOTE — Progress Notes (Signed)
Right elbow pain.  Has paperwork for work to be filled out.

## 2021-07-08 NOTE — Assessment & Plan Note (Signed)
In the setting of PE Currently on 3 L of oxygen on exertion

## 2021-07-08 NOTE — Patient Instructions (Signed)
Hold your Coumadin today, Wednesday and Thursday. You will receive new instructions on Friday at your visit with [facility].

## 2021-07-10 ENCOUNTER — Telehealth: Payer: Self-pay | Admitting: Physician Assistant

## 2021-07-10 ENCOUNTER — Encounter: Payer: Self-pay | Admitting: Family Medicine

## 2021-07-10 NOTE — Telephone Encounter (Signed)
Scheduled appt per 11/9 referral. Pt is aware of appt date and time.  

## 2021-07-11 ENCOUNTER — Ambulatory Visit: Payer: Self-pay | Attending: Family Medicine | Admitting: Pharmacist

## 2021-07-11 ENCOUNTER — Other Ambulatory Visit: Payer: Self-pay

## 2021-07-11 LAB — POCT INR: INR: 1.6 — AB (ref 2.0–3.0)

## 2021-07-16 ENCOUNTER — Other Ambulatory Visit: Payer: Self-pay

## 2021-07-16 ENCOUNTER — Ambulatory Visit: Payer: Self-pay | Attending: Family Medicine | Admitting: Pharmacist

## 2021-07-16 DIAGNOSIS — I2609 Other pulmonary embolism with acute cor pulmonale: Secondary | ICD-10-CM

## 2021-07-16 LAB — POCT INR: INR: 1.8 — AB (ref 2.0–3.0)

## 2021-07-21 ENCOUNTER — Ambulatory Visit: Payer: Self-pay | Attending: Family Medicine | Admitting: Pharmacist

## 2021-07-21 ENCOUNTER — Other Ambulatory Visit: Payer: Self-pay

## 2021-07-21 DIAGNOSIS — I2609 Other pulmonary embolism with acute cor pulmonale: Secondary | ICD-10-CM

## 2021-07-21 LAB — POCT INR: INR: 2.6 (ref 2.0–3.0)

## 2021-07-25 ENCOUNTER — Other Ambulatory Visit: Payer: Self-pay

## 2021-07-25 ENCOUNTER — Encounter: Payer: Self-pay | Admitting: Physician Assistant

## 2021-07-25 ENCOUNTER — Telehealth: Payer: Self-pay | Admitting: Physician Assistant

## 2021-07-25 ENCOUNTER — Inpatient Hospital Stay: Payer: Self-pay | Attending: Physician Assistant | Admitting: Physician Assistant

## 2021-07-25 ENCOUNTER — Inpatient Hospital Stay: Payer: Self-pay

## 2021-07-25 VITALS — BP 138/83 | HR 85 | Temp 97.2°F | Resp 18 | Wt 374.8 lb

## 2021-07-25 DIAGNOSIS — I2609 Other pulmonary embolism with acute cor pulmonale: Secondary | ICD-10-CM

## 2021-07-25 DIAGNOSIS — Z803 Family history of malignant neoplasm of breast: Secondary | ICD-10-CM | POA: Insufficient documentation

## 2021-07-25 DIAGNOSIS — I2699 Other pulmonary embolism without acute cor pulmonale: Secondary | ICD-10-CM | POA: Insufficient documentation

## 2021-07-25 DIAGNOSIS — M7989 Other specified soft tissue disorders: Secondary | ICD-10-CM

## 2021-07-25 DIAGNOSIS — I82412 Acute embolism and thrombosis of left femoral vein: Secondary | ICD-10-CM

## 2021-07-25 DIAGNOSIS — I82432 Acute embolism and thrombosis of left popliteal vein: Secondary | ICD-10-CM | POA: Insufficient documentation

## 2021-07-25 LAB — CBC WITH DIFFERENTIAL (CANCER CENTER ONLY)
Abs Immature Granulocytes: 0.02 10*3/uL (ref 0.00–0.07)
Basophils Absolute: 0 10*3/uL (ref 0.0–0.1)
Basophils Relative: 0 %
Eosinophils Absolute: 0.2 10*3/uL (ref 0.0–0.5)
Eosinophils Relative: 4 %
HCT: 39.7 % (ref 39.0–52.0)
Hemoglobin: 12.7 g/dL — ABNORMAL LOW (ref 13.0–17.0)
Immature Granulocytes: 0 %
Lymphocytes Relative: 38 %
Lymphs Abs: 2.6 10*3/uL (ref 0.7–4.0)
MCH: 26.3 pg (ref 26.0–34.0)
MCHC: 32 g/dL (ref 30.0–36.0)
MCV: 82.2 fL (ref 80.0–100.0)
Monocytes Absolute: 0.7 10*3/uL (ref 0.1–1.0)
Monocytes Relative: 10 %
Neutro Abs: 3.3 10*3/uL (ref 1.7–7.7)
Neutrophils Relative %: 48 %
Platelet Count: 160 10*3/uL (ref 150–400)
RBC: 4.83 MIL/uL (ref 4.22–5.81)
RDW: 15.1 % (ref 11.5–15.5)
WBC Count: 6.8 10*3/uL (ref 4.0–10.5)
nRBC: 0 % (ref 0.0–0.2)

## 2021-07-25 LAB — CMP (CANCER CENTER ONLY)
ALT: 30 U/L (ref 0–44)
AST: 15 U/L (ref 15–41)
Albumin: 3.8 g/dL (ref 3.5–5.0)
Alkaline Phosphatase: 93 U/L (ref 38–126)
Anion gap: 10 (ref 5–15)
BUN: 20 mg/dL (ref 6–20)
CO2: 16 mmol/L — ABNORMAL LOW (ref 22–32)
Calcium: 8.8 mg/dL — ABNORMAL LOW (ref 8.9–10.3)
Chloride: 113 mmol/L — ABNORMAL HIGH (ref 98–111)
Creatinine: 1.34 mg/dL — ABNORMAL HIGH (ref 0.61–1.24)
GFR, Estimated: >60 mL/min (ref >60)
Glucose, Bld: 124 mg/dL — ABNORMAL HIGH (ref 70–99)
Potassium: 4.3 mmol/L (ref 3.5–5.1)
Sodium: 139 mmol/L (ref 135–145)
Total Bilirubin: 0.3 mg/dL (ref 0.3–1.2)
Total Protein: 7.2 g/dL (ref 6.5–8.1)

## 2021-07-25 LAB — ANTITHROMBIN III: AntiThromb III Func: 120 % (ref 75–120)

## 2021-07-25 NOTE — Progress Notes (Signed)
[place] [phone]   [phone]  INITIAL CONSULT NOTE  Patient Care Team: Charlott Rakes, MD as PCP - General (Family Medicine) Skeet Latch, MD as PCP - Cardiology (Cardiology)   CHIEF COMPLAINTS/PURPOSE OF CONSULTATION:  Acute bilateral pulmonary emboli and left lower extremity DVT  HISTORY OF PRESENTING ILLNESS:  William Moss 35 y.o. male with medical history significant for diabetes, GERD, hypertension and obesity.  Patient is unaccompanied for this visit.  On review of the previous records, Mr. Akhavan presented to the emergency room for shortness of breath.  CT angiogram of the chest on 06/23/2021 revealed bilateral lobar pulmonary artery emboli with evidence of right heart strain.  Doppler ultrasound of the lower extremities on 06/24/2021 revealed acute DVT involving the left femoral vein and left popliteal vein.  He was started on tPA per pulmonology followed by IV heparin infusion.  He was started on Coumadin with heparin bridge and was discharged on 30 L of nasal cannula oxygen as needed.  On exam today, Mr. Manninen reports that he is almost back to baseline.  He still has some fatigue but continues to improve each day since hospital discharge he continues to use supplemental oxygen, approximately 3 L sparingly.  He is more active but continues to have shortness of breath with exertion.  His appetite is unchanged without any noticeable weight loss.  Patient denies any nausea, vomiting or abdominal pain.  His bowel habits are unchanged without any diarrhea or constipation.  He denies easy bruising or signs of bleeding while on anticoagulation.  Since hospital discharge, patient has felt a cordlike nodularity in the right upper extremity.  It is tender to palpation and movement.  He adds that is the same extremity in which she had a PICC line placed.  He denies fevers, chills, night sweats, chest pain cough, lower extremity edema, neuropathy or  other skin changes.  He has no other complaints.  Rest of the 10 point ROS is below.  MEDICAL HISTORY:  Past Medical History:  Diagnosis Date   Atypical chest pain 05/27/2016   a. 05/2016: NST showing small area of moderare anteroapical ischemia b. 05/2016: cath showing tortous but normal cors which confirmed a false positive NST.   Diabetes mellitus without complication ([place])    GERD (gastroesophageal reflux disease) 10/02/2016   Hypertension    Pulmonary embolism, bilateral ([place])     SURGICAL HISTORY: Past Surgical History:  Procedure Laterality Date   CARDIAC CATHETERIZATION N/A 06/10/2016   Procedure: Left Heart Cath and Coronary Angiography;  Surgeon: Wellington Hampshire, MD;  Location: [facility] [facility];  Service: Cardiovascular;  Laterality: N/A;   COLONOSCOPY N/A 02/04/2016   Procedure: COLONOSCOPY;  Surgeon: Syncere Kaminski Shipper, MD;  Location: [facility];  Service: Endoscopy;  Laterality: N/A;   NO PAST SURGERIES      SOCIAL HISTORY: Social History   Socioeconomic History   Marital status: Single    Spouse name: Not on file   Number of children: 0   Years of education: Not on file   Highest education level: Not on file  Occupational History   Occupation: call center rep  Tobacco Use   Smoking status: Never   Smokeless tobacco: Never  Substance and Sexual Activity   Alcohol use: No   Drug use: No   Sexual activity: Not Currently  Other Topics Concern   Not on file  Social History Narrative   Not on file   Social Determinants of Health  Financial Resource Strain: Not on file  Food Insecurity: Not on file  Transportation Needs: Not on file  Physical Activity: Not on file  Stress: Not on file  Social Connections: Not on file  Intimate Partner Violence: Not on file    FAMILY HISTORY: Family History  Problem Relation Age of Onset   Diabetes Mother    Diabetes Father    Pulmonary embolism Father    Cancer Maternal Grandmother    Alzheimer's disease Paternal  Grandmother    Breast cancer Cousin     ALLERGIES:  is allergic to lisinopril-hydrochlorothiazide.  MEDICATIONS:  Current Outpatient Medications  Medication Sig Dispense Refill   atorvastatin (LIPITOR) 40 MG tablet Take 1 tablet (40 mg total) by mouth daily. 90 tablet 1   Blood Glucose Monitoring Suppl (ONETOUCH VERIO REFLECT) w/Device KIT Check blood sugar TID E11.69 1 kit 0   carvedilol (COREG) 25 MG tablet Take 1 tablet (25 mg total) by mouth 2 (two) times daily. 180 tablet 1   hydrALAZINE (APRESOLINE) 50 MG tablet Take 1 tablet (50 mg total) by mouth every 8 (eight) hours. 90 tablet 1   insulin aspart (NOVOLOG) 100 UNIT/ML injection Inject 10 Units into the skin 3 (three) times daily after meals. 10 mL 11   insulin detemir (LEVEMIR) 100 UNIT/ML injection Inject 0.25 mLs (25 Units total) into the skin daily. 10 mL 11   Insulin Pen Needle (BD PEN NEEDLE NANO U/F) 32G X 4 MM MISC 10 Units by Does not apply route daily. 100 each 3   Lancets (ONETOUCH DELICA PLUS [id number]) MISC CHECK BLOOD SUGAR 3 TIMES A DAY 100 each 2   metFORMIN (GLUCOPHAGE) 500 MG tablet Take 2 tablets (1,000 mg total) by mouth 2 (two) times daily with a meal. 360 tablet 1   warfarin (COUMADIN) 10 MG tablet Take warfarin 10 mg daily except on Monday and Friday, take only 5 mg . 30 tablet 2   amLODipine (NORVASC) 10 MG tablet Take 1 tablet (10 mg total) by mouth daily. 90 tablet 1   No current facility-administered medications for this visit.    REVIEW OF SYSTEMS:   Constitutional: ( - ) fevers, ( - )  chills , ( - ) night sweats Eyes: ( - ) blurriness of vision, ( - ) double vision, ( - ) watery eyes Ears, nose, mouth, throat, and face: ( - ) mucositis, ( - ) sore throat Respiratory: ( - ) cough, ( - ) dyspnea, ( - ) wheezes Cardiovascular: ( - ) palpitation, ( - ) chest discomfort, ( - ) lower extremity swelling Gastrointestinal:  ( - ) nausea, ( - ) heartburn, ( - ) change in bowel habits Skin: ( - ) abnormal  skin rashes Lymphatics: ( - ) new lymphadenopathy, ( - ) easy bruising Neurological: ( - ) numbness, ( - ) tingling, ( - ) new weaknesses Behavioral/Psych: ( - ) mood change, ( - ) new changes  All other systems were reviewed with the patient and are negative.  PHYSICAL EXAMINATION: ECOG PERFORMANCE STATUS: 1 - Symptomatic but completely ambulatory  Vitals:   07/25/21 1118  BP: 138/83  Pulse: 85  Resp: 18  Temp: (!) 97.2 F (36.2 C)  SpO2: 99%   Filed Weights   07/25/21 1118  Weight: (!) 374 lb 12.8 oz (170 kg)    GENERAL: well appearing male in NAD  SKIN: skin color, texture, turgor are normal, no rashes or significant lesions EYES: conjunctiva are pink and non-injected, sclera  clear OROPHARYNX: no exudate, no erythema; lips, buccal mucosa, and tongue normal  NECK: supple, non-tender LYMPH:  no palpable lymphadenopathy in the cervical or supraclavicular lymph nodes.  LUNGS: clear to auscultation and percussion with normal breathing effort HEART: regular rate & rhythm and no murmurs and no lower extremity edema ABDOMEN: soft, non-tender, non-distended, normal bowel sounds Musculoskeletal: no cyanosis of digits and no clubbing  PSYCH: alert & oriented x 3, fluent speech NEURO: no focal motor/sensory deficits  LABORATORY DATA:  I have reviewed the data as listed CBC Latest Ref Rng & Units 07/25/2021 07/03/2021 07/02/2021  WBC 4.0 - 10.5 K/uL 6.8 8.0 7.5  Hemoglobin 13.0 - 17.0 g/dL 12.7(L) 11.7(L) 11.8(L)  Hematocrit 39.0 - 52.0 % 39.7 37.1(L) 37.0(L)  Platelets 150 - 400 K/uL 160 244 228    CMP Latest Ref Rng & Units 07/25/2021 07/03/2021 07/02/2021  Glucose 70 - 99 mg/dL 124(H) 194(H) 158(H)  BUN 6 - 20 mg/dL 20 28(H) 23(H)  Creatinine 0.61 - 1.24 mg/dL 1.34(H) 1.39(H) 1.44(H)  Sodium 135 - 145 mmol/L 139 132(L) 136  Potassium 3.5 - 5.1 mmol/L 4.3 4.4 4.4  Chloride 98 - 111 mmol/L 113(H) 104 106  CO2 22 - 32 mmol/L 16(L) 21(L) 24  Calcium 8.9 - 10.3 mg/dL 8.8(L) 8.9  9.3  Total Protein 6.5 - 8.1 g/dL 7.2 - -  Total Bilirubin 0.3 - 1.2 mg/dL 0.3 - -  Alkaline Phos 38 - 126 U/L 93 - -  AST 15 - 41 U/L 15 - -  ALT 0 - 44 U/L 30 - -    ASSESSMENT & PLAN JUMAANE WEATHERFORD is a 35 y.o. male who presents to the clinic for evaluation for acute pulmonary emboli and left lower extremity DVT.  Patient is currently on Coumadin and tolerating it well.  I reviewed underlying provoking factors that can cause venous thromboembolisms including recent surgery, prolonged immobility, infection, recent travel, smoking, and inflammatory processes.  Likely etiology for patient's PE and DVT is multifactorial with recent hospital acquired pneumonia along with prolonged immobility due to sedentary lifestyle.  The recommendation is at least 6 months of anticoagulation.  However, if patient is unable to become more active, we would recommend indefinite anticoagulation.   Patient will proceed with serologic evaluation today and check CBC, CMP and complete hypercoagulable workup.  #Acute bilateral PE and left leg DVT: --Likely provoking factors include recent episode of pneumonia and prolonged immobility due to sedentary lifestyle.  --Currently on coumadin, INR in therapeutic range.  --Recommend at least 6 months of anticoagulation but consider indefinite anticoagulation if patient is unable to become more active.  --Labs today to check CBC, CMP, Protein C and S activity, Factor 5 Leiden, prothrombin gene, Antithrombin III, lupus anticoagulant, anticardiolipin antibodies and beta-2 glycoprotein antibodies. --RTC in 5 months with labs   #Cord-like nodularity involving R upper extremity: --PICC line was placed on R upper extremity during recent hospitalization --Suspect superficial thrombophlebitis versus DVT. --We will order Doppler ultrasound of the right upper extremity for further evaluation.   Orders Placed This Encounter  Procedures   Antithrombin III   Protein C activity    Protein C, total   Protein S activity   Protein S, total   Lupus anticoagulant panel   Beta-2-glycoprotein i abs, IgG/M/A   Cardiolipin antibodies, IgG, IgM, IgA   CBC with Differential (Cancer Center Only)    Standing Status:   Future    Number of Occurrences:   1    Standing Expiration  Date:   07/25/2022   CMP ([place] only)    Standing Status:   Future    Number of Occurrences:   1    Standing Expiration Date:   07/25/2022   Prothrombin gene mutation*    Standing Status:   Future    Standing Expiration Date:   07/25/2022   Factor 5 Leiden*    Standing Status:   Future    Standing Expiration Date:   07/25/2022   dRVVT Mix   dRVVT Confirm    All questions were answered. The patient knows to call the clinic with any problems, questions or concerns.  I have spent a total of 60 minutes minutes of face-to-face and non-face-to-face time, preparing to see the patient, obtaining and/or reviewing separately obtained history, performing a medically appropriate examination, counseling and educating the patient, ordering medications/tests, documenting clinical information in the electronic health record,  and care coordination.   Dede Query, PA-C Department of Hematology/Oncology [place] at [facility] Phone: [phone]  Patient was seen with Dr. Lorenso Courier.   I have read the above note and personally examined the patient. I agree with the assessment and plan as noted above.  Briefly Mr. Artavious Trebilcock is a 35 year old male who presents for evaluation of a pulmonary embolism.  Provoking factors were thought to be sedentary lifestyle and recent pneumonia.  Given the fact that his sedentary lifestyle is due to his obesity and this would be difficult to modify risk factor I would recommend proceeding with at least 6 months of anticoagulation with reevaluation at that time.  If his lifestyle remains the same would recommend indefinite anticoagulation.   Additionally given his young age would recommend a focused hypercoagulable work-up, in particular focusing on protein C and protein S given the fact he is on Coumadin.  Plan to see the patient back in 6 months time to reevaluate.   Ledell Peoples, MD Department of Hematology/Oncology [place] at [facility] Phone: [phone] Pager: [phone] Email: [email or web address]

## 2021-07-25 NOTE — Telephone Encounter (Signed)
Scheduled per 11/25 los, message was left with pt

## 2021-07-26 LAB — PROTEIN C, TOTAL: Protein C, Total: 71 % (ref 60–150)

## 2021-07-27 LAB — LUPUS ANTICOAGULANT PANEL
DRVVT: 75.9 s — ABNORMAL HIGH (ref 0.0–47.0)
PTT Lupus Anticoagulant: 41.6 s (ref 0.0–51.9)

## 2021-07-27 LAB — BETA-2-GLYCOPROTEIN I ABS, IGG/M/A
Beta-2 Glyco I IgG: <9 GPI IgG units (ref 0–20)
Beta-2-Glycoprotein I IgA: <9 GPI IgA units (ref 0–25)
Beta-2-Glycoprotein I IgM: <9 GPI IgM units (ref 0–32)

## 2021-07-27 LAB — DRVVT CONFIRM: dRVVT Confirm: 1.3 ratio — ABNORMAL HIGH (ref 0.8–1.2)

## 2021-07-27 LAB — PROTEIN S ACTIVITY: Protein S Activity: 16 % — ABNORMAL LOW (ref 63–140)

## 2021-07-27 LAB — PROTEIN C ACTIVITY: Protein C Activity: 68 % — ABNORMAL LOW (ref 73–180)

## 2021-07-27 LAB — PROTEIN S, TOTAL: Protein S Ag, Total: 61 % (ref 60–150)

## 2021-07-27 LAB — DRVVT MIX: dRVVT Mix: 42.2 s — ABNORMAL HIGH (ref 0.0–40.4)

## 2021-07-28 ENCOUNTER — Ambulatory Visit (HOSPITAL_COMMUNITY): Payer: Self-pay | Attending: Physician Assistant

## 2021-07-28 ENCOUNTER — Encounter: Payer: Self-pay | Admitting: Family Medicine

## 2021-07-29 ENCOUNTER — Ambulatory Visit: Payer: Self-pay | Admitting: Family Medicine

## 2021-07-29 LAB — CARDIOLIPIN ANTIBODIES, IGG, IGM, IGA
Anticardiolipin IgA: <9 APL U/mL (ref 0–11)
Anticardiolipin IgG: <9 GPL U/mL (ref 0–14)
Anticardiolipin IgM: 10 MPL U/mL (ref 0–12)

## 2021-07-31 ENCOUNTER — Encounter: Payer: Self-pay | Admitting: Physician Assistant

## 2021-07-31 ENCOUNTER — Other Ambulatory Visit: Payer: Self-pay | Admitting: *Deleted

## 2021-08-01 ENCOUNTER — Other Ambulatory Visit: Payer: Self-pay

## 2021-08-04 ENCOUNTER — Ambulatory Visit: Payer: BC Managed Care – PPO | Attending: Family Medicine | Admitting: Pharmacist

## 2021-08-04 ENCOUNTER — Encounter: Payer: Self-pay | Admitting: Family Medicine

## 2021-08-04 ENCOUNTER — Telehealth: Payer: Self-pay | Admitting: Family Medicine

## 2021-08-04 ENCOUNTER — Inpatient Hospital Stay: Payer: BC Managed Care – PPO | Attending: Physician Assistant

## 2021-08-04 ENCOUNTER — Other Ambulatory Visit: Payer: Self-pay

## 2021-08-04 ENCOUNTER — Encounter: Payer: Self-pay | Admitting: Physician Assistant

## 2021-08-04 DIAGNOSIS — I2699 Other pulmonary embolism without acute cor pulmonale: Secondary | ICD-10-CM | POA: Diagnosis not present

## 2021-08-04 DIAGNOSIS — I2609 Other pulmonary embolism with acute cor pulmonale: Secondary | ICD-10-CM | POA: Diagnosis not present

## 2021-08-04 DIAGNOSIS — I82402 Acute embolism and thrombosis of unspecified deep veins of left lower extremity: Secondary | ICD-10-CM | POA: Diagnosis not present

## 2021-08-04 LAB — POCT INR: INR: 2.7 (ref 2.0–3.0)

## 2021-08-04 NOTE — Telephone Encounter (Signed)
Pt brought in paperwork, Capabilities and limitations form, to be filled out by the Provider, pt stated the paperwork is so his job allows him to take off work for his appts. Pts last appt was on 07/08/21  Paperwork was placed in Dr Earley Abide box on 08/04/21

## 2021-08-04 NOTE — Telephone Encounter (Signed)
Routing to PCP for review.

## 2021-08-06 ENCOUNTER — Institutional Professional Consult (permissible substitution): Payer: Self-pay | Admitting: Pulmonary Disease

## 2021-08-11 LAB — FACTOR 5 LEIDEN

## 2021-08-11 LAB — PROTHROMBIN GENE MUTATION

## 2021-08-13 ENCOUNTER — Other Ambulatory Visit: Payer: Self-pay

## 2021-08-13 ENCOUNTER — Ambulatory Visit (INDEPENDENT_AMBULATORY_CARE_PROVIDER_SITE_OTHER): Payer: BC Managed Care – PPO | Admitting: Pulmonary Disease

## 2021-08-13 ENCOUNTER — Encounter: Payer: Self-pay | Admitting: Pulmonary Disease

## 2021-08-13 ENCOUNTER — Ambulatory Visit (HOSPITAL_COMMUNITY)
Admission: RE | Admit: 2021-08-13 | Discharge: 2021-08-13 | Disposition: A | Discharge status: Home / Self Care | Payer: BC Managed Care – PPO | Source: Ambulatory Visit | Attending: Physician Assistant | Admitting: Physician Assistant

## 2021-08-13 VITALS — BP 132/78 | HR 78 | Temp 98.5°F | Ht 69.0 in | Wt 380.0 lb

## 2021-08-13 DIAGNOSIS — M79601 Pain in right arm: Secondary | ICD-10-CM

## 2021-08-13 DIAGNOSIS — M7989 Other specified soft tissue disorders: Secondary | ICD-10-CM | POA: Diagnosis not present

## 2021-08-13 DIAGNOSIS — I2609 Other pulmonary embolism with acute cor pulmonale: Secondary | ICD-10-CM

## 2021-08-13 NOTE — Patient Instructions (Signed)
Nice to meet you  I recommend to continue warfarin anticoagulation for the blood clot for at least 6 months, but consider continuing this indefinitely but we can have discussions about this in the future.  We will try to get a heart ultrasound echocardiogram of the right side your heart late January 2023.  We have had trouble seeing the right side of the heart in the past but will give it another try.  Return to clinic in 2 months or sooner as needed with Dr. Judeth Horn

## 2021-08-13 NOTE — Progress Notes (Signed)
_0  ID: William Moss, male    DOB: 1986-03-28, 35 y.o.   MRN: [id number]  Chief Complaint  Patient presents with   Consult    Consult for SOB and blood clots and was admitted on 10/24. Is currently on warfrin     Referring provider: Charlott Rakes, MD  HPI:   35 y.o. man with submassive PE s/p TPA presents to clinic for hospital follow up of PE.  Patient presented with long hospital October 2022.  Chest pain, shortness of breath.  CTA PE protocol revealed significant clot burden as well as bilateral left greater than right infiltrates on my review interpretation.  Evidence of heart strain on CT scan.  History of anticoagulation.  Despite this, worsening symptoms and hypoxemia that was refractory and severe.  Echocardiogram was performed but RV could not be visualized on my review.  Given worsening clinical status, systemic tPA was administered.  He had rapid improvement in symptoms.  She returns to clinic today for follow-up.  He is on warfarin which was chosen given his morbid obesity and concern for unreliable anticoagulation with factor Xa antagonist.  He is doing well.  Dyspnea gradually improved.  Still has some residual dyspnea on exertion.  Mainly with longer walks or inclines or stairs.   Questionaires / Pulmonary Flowsheets:   ACT:  No flowsheet data found.  MMRC: mMRC Dyspnea Scale mMRC Score  08/13/2021 3    Epworth:  No flowsheet data found.  Tests:   FENO:  No results found for: NITRICOXIDE  PFT: No flowsheet data found.  WALK:  No flowsheet data found.  Imaging: Personally reviewed and as per EMR VAS [place] UPPER EXTREMITY VENOUS DUPLEX  Result Date: 08/13/2021 UPPER VENOUS STUDY  Patient Name:  William Moss  Date of Exam:   08/13/2021 Medical Rec #: [id number]      Accession #:    [id number] Date of Birth: Feb 10, 1986     Patient Gender: M Patient Age:   35 years Exam Location:  [facility] Procedure:      VAS [place] UPPER EXTREMITY VENOUS  DUPLEX Referring Phys: Dede Query --------------------------------------------------------------------------------  Indications: Cord nodularity Risk Factors: Trauma Recent PICC line removal. Limitations: Body habitus and poor ultrasound/tissue interface. Comparison Study: No prior studies. Performing Technologist: Oliver Hum RVT  Examination Guidelines: A complete evaluation includes B-mode imaging, spectral Doppler, color Doppler, and power Doppler as needed of all accessible portions of each vessel. Bilateral testing is considered an integral part of a complete examination. Limited examinations for reoccurring indications may be performed as noted.  Right Findings: +----------+------------+---------+-----------+----------+-----------------+  RIGHT      Compressible Phasicity Spontaneous Properties      Summary       +----------+------------+---------+-----------+----------+-----------------+  IJV            Full        Yes        Yes                                   +----------+------------+---------+-----------+----------+-----------------+  Subclavian     Full        Yes        Yes                                   +----------+------------+---------+-----------+----------+-----------------+  Axillary  Full        Yes        Yes                                   +----------+------------+---------+-----------+----------+-----------------+  Brachial       Full        Yes        Yes                                   +----------+------------+---------+-----------+----------+-----------------+  Radial         Full                                                         +----------+------------+---------+-----------+----------+-----------------+  Ulnar          Full                                                         +----------+------------+---------+-----------+----------+-----------------+  Cephalic       Full                                                          +----------+------------+---------+-----------+----------+-----------------+  Basilic        None                                      Age Indeterminate  +----------+------------+---------+-----------+----------+-----------------+ Thrombus located in the basilic vein is noted in the proximal and mid forearm.  Left Findings: +----------+------------+---------+-----------+----------+-------+  LEFT       Compressible Phasicity Spontaneous Properties Summary  +----------+------------+---------+-----------+----------+-------+  Subclavian     Full        Yes        Yes                         +----------+------------+---------+-----------+----------+-------+  Summary:  Right: No evidence of deep vein thrombosis in the upper extremity.  Left: No evidence of thrombosis in the subclavian. Findings consistent with age indeterminate superficial vein thrombosis involving the left basilic vein.  *See table(s) above for measurements and observations.    Preliminary     Lab Results: Personally reviewed CBC    Component Value Date/Time   WBC 6.8 07/25/2021 1202   WBC 8.0 07/03/2021 0406   RBC 4.83 07/25/2021 1202   HGB 12.7 (L) 07/25/2021 1202   HCT 39.7 07/25/2021 1202   PLT 160 07/25/2021 1202   MCV 82.2 07/25/2021 1202   MCH 26.3 07/25/2021 1202   MCHC 32.0 07/25/2021 1202   RDW 15.1 07/25/2021 1202   LYMPHSABS 2.6 07/25/2021 1202   MONOABS 0.7 07/25/2021 1202   EOSABS 0.2 07/25/2021 1202   BASOSABS 0.0 07/25/2021 1202    BMET  Component Value Date/Time   NA 139 07/25/2021 1202   NA 141 04/17/2021 1011   K 4.3 07/25/2021 1202   CL 113 (H) 07/25/2021 1202   CO2 16 (L) 07/25/2021 1202   GLUCOSE 124 (H) 07/25/2021 1202   BUN 20 07/25/2021 1202   BUN 21 (H) 04/17/2021 1011   CREATININE 1.34 (H) 07/25/2021 1202   CREATININE 0.92 08/28/2016 1000   CALCIUM 8.8 (L) 07/25/2021 1202   GFRNONAA >60 07/25/2021 1202   GFRNONAA >89 08/28/2016 1000   GFRAA 62 10/07/2020 1140   GFRAA >89 08/28/2016 1000     BNP    Component Value Date/Time   BNP 69.6 06/23/2021 0029    ProBNP No results found for: PROBNP  Specialty Problems       Pulmonary Problems   Sleep apnea   Acute respiratory failure with hypoxia (HCC)   Pneumonia    Allergies  Allergen Reactions   Lisinopril-Hydrochlorothiazide Other (See Comments)    "Interfered with the pH level in the serum"    Immunization History  Administered Date(s) Administered   Influenza,inj,Quad PF,6+ Mos 07/29/2015, 08/28/2016, 09/17/2017, 09/13/2018, 05/15/2019, 05/30/2020   Influenza-Unspecified 07/15/2021   PFIZER(Purple Top)SARS-COV-2 Vaccination 11/26/2019, 12/26/2019, 07/01/2020   Pneumococcal Polysaccharide-23 08/28/2015   Tdap 05/15/2019    Past Medical History:  Diagnosis Date   Atypical chest pain 05/27/2016   a. 05/2016: NST showing small area of moderare anteroapical ischemia b. 05/2016: cath showing tortous but normal cors which confirmed a false positive NST.   Diabetes mellitus without complication ([place])    GERD (gastroesophageal reflux disease) 10/02/2016   Hypertension    Pulmonary embolism, bilateral (HCC)     Tobacco History: Social History   Tobacco Use  Smoking Status Never  Smokeless Tobacco Never   Counseling given: Not Answered   Continue to not smoke  Outpatient Encounter Medications as of 08/13/2021  Medication Sig   atorvastatin (LIPITOR) 40 MG tablet Take 1 tablet (40 mg total) by mouth daily.   Blood Glucose Monitoring Suppl (ONETOUCH VERIO REFLECT) w/Device KIT Check blood sugar TID E11.69   carvedilol (COREG) 25 MG tablet Take 1 tablet (25 mg total) by mouth 2 (two) times daily.   hydrALAZINE (APRESOLINE) 50 MG tablet Take 1 tablet (50 mg total) by mouth every 8 (eight) hours.   insulin aspart (NOVOLOG) 100 UNIT/ML injection Inject 10 Units into the skin 3 (three) times daily after meals.   insulin detemir (LEVEMIR) 100 UNIT/ML injection Inject 0.25 mLs (25 Units total) into the skin  daily.   Insulin Pen Needle (BD PEN NEEDLE NANO U/F) 32G X 4 MM MISC 10 Units by Does not apply route daily.   Lancets (ONETOUCH DELICA PLUS [id number]) MISC CHECK BLOOD SUGAR 3 TIMES A DAY   metFORMIN (GLUCOPHAGE) 500 MG tablet Take 2 tablets (1,000 mg total) by mouth 2 (two) times daily with a meal.   warfarin (COUMADIN) 10 MG tablet Take warfarin 10 mg daily except on Monday and Friday, take only 5 mg .   amLODipine (NORVASC) 10 MG tablet Take 1 tablet (10 mg total) by mouth daily.   [DISCONTINUED] lisinopril-hydrochlorothiazide (ZESTORETIC) 20-12.5 MG tablet Take 2 tablets by mouth daily.   No facility-administered encounter medications on file as of 08/13/2021.     Review of Systems  Review of Systems  No chest pain.  No orthopnea or PND.  No black or tarry stools.  No frank blood in stools.  No hemoptysis, no hematemesis.  Comprehensive review of systems otherwise negative.  Physical Exam  BP 132/78 (BP Location: Left Arm, Patient Position: Sitting, Cuff Size: Normal)    Pulse 78    Temp 98.5 F (36.9 C) (Oral)    Ht _0  (1.753 m)    Wt (!) 380 lb (172.4 kg)    SpO2 98%    BMI 56.12 kg/m   Wt Readings from Last 5 Encounters:  08/13/21 (!) 380 lb (172.4 kg)  07/25/21 (!) 374 lb 12.8 oz (170 kg)  07/08/21 (!) 368 lb (166.9 kg)  07/03/21 (!) 359 lb 12.7 oz (163.2 kg)  06/08/21 (!) 380 lb 6.4 oz (172.5 kg)    BMI Readings from Last 5 Encounters:  08/13/21 56.12 kg/m  07/25/21 55.35 kg/m  07/08/21 54.34 kg/m  07/03/21 53.13 kg/m  06/08/21 56.18 kg/m     Physical Exam General: Obese, sitting in exam chair Eyes: EOMI, icterus Neck: Supple, no JVP Pulmonary: Clear, distant Cardiovascular: Regular rate and rhythm, no murmur appreciated Abdomen: Nondistended, bowel sounds present MSK: No synovitis, no joint effusion Neuro: Normal gait, no weakness patient Psych: Normal mood, full affect   Assessment & Plan:   Submassive PE: With severe refractory hypoxemia status  post systemic tPA.  Symptoms improved over time.  Still residual dyspnea.  We will attempt to obtain review of RV with repeat TTE.  Unsuccessful in determining RV function and size etc. in the past.  He is  to continue warfarin.  Recommend lifelong anticoagulation plan to at this time.  Placed for dyspnea on exertion likely multifactorial deconditioning with recent hospitalization.  Obesity likely contributing as well. No suspicion for residual chronic improvement of symptoms over time.   Return in about 2 months (around 10/14/2021).   Lanier Clam, MD 08/13/2021   This appointment required 65 minutes of patient care (this includes precharting, chart review, review of results, face-to-face care, etc.).

## 2021-08-13 NOTE — Progress Notes (Signed)
Right upper extremity venous duplex has been completed. Preliminary results can be found in CV Proc through chart review.  Results were given to [facility] office.  08/13/21 9:22 AM Olen Cordial RVT

## 2021-08-14 ENCOUNTER — Encounter: Payer: Self-pay | Admitting: Physician Assistant

## 2021-08-21 DIAGNOSIS — Z03818 Encounter for observation for suspected exposure to other biological agents ruled out: Secondary | ICD-10-CM | POA: Diagnosis not present

## 2021-08-21 DIAGNOSIS — Z20822 Contact with and (suspected) exposure to covid-19: Secondary | ICD-10-CM | POA: Diagnosis not present

## 2021-09-04 ENCOUNTER — Other Ambulatory Visit: Payer: Self-pay

## 2021-09-04 ENCOUNTER — Ambulatory Visit: Payer: BC Managed Care – PPO | Attending: Family Medicine | Admitting: Pharmacist

## 2021-09-04 DIAGNOSIS — I2609 Other pulmonary embolism with acute cor pulmonale: Secondary | ICD-10-CM | POA: Diagnosis not present

## 2021-09-04 LAB — POCT INR: INR: 1.3 — AB (ref 2.0–3.0)

## 2021-09-12 ENCOUNTER — Telehealth: Payer: Self-pay

## 2021-09-12 NOTE — Telephone Encounter (Signed)
-----   Message from Kyra Searles, RN sent at 09/12/2021  2:55 PM EST -----  ----- Message ----- From: Raymondo Band Sent: 08/11/2021   2:09 PM EST To: Kyra Searles, RN  Please call patient and let him know that there is no evidence of factor 5 leiden and prothrombin gene mutation. Recommend to continue on his current anticoagulation.

## 2021-09-15 DIAGNOSIS — I10 Essential (primary) hypertension: Secondary | ICD-10-CM | POA: Diagnosis not present

## 2021-09-15 DIAGNOSIS — Z20822 Contact with and (suspected) exposure to covid-19: Secondary | ICD-10-CM | POA: Diagnosis not present

## 2021-09-15 DIAGNOSIS — R519 Headache, unspecified: Secondary | ICD-10-CM | POA: Diagnosis not present

## 2021-09-15 DIAGNOSIS — Z03818 Encounter for observation for suspected exposure to other biological agents ruled out: Secondary | ICD-10-CM | POA: Diagnosis not present

## 2021-09-18 ENCOUNTER — Ambulatory Visit: Payer: BC Managed Care – PPO | Attending: Family Medicine | Admitting: Pharmacist

## 2021-09-18 ENCOUNTER — Other Ambulatory Visit: Payer: Self-pay

## 2021-09-18 DIAGNOSIS — I2609 Other pulmonary embolism with acute cor pulmonale: Secondary | ICD-10-CM

## 2021-09-18 LAB — POCT INR: INR: 1.6 — AB (ref 2.0–3.0)

## 2021-09-21 ENCOUNTER — Emergency Department (HOSPITAL_COMMUNITY)
Admission: EM | Admit: 2021-09-21 | Discharge: 2021-09-22 | Disposition: A | Discharge status: Home / Self Care | Payer: BC Managed Care – PPO | Attending: Emergency Medicine | Admitting: Emergency Medicine

## 2021-09-21 ENCOUNTER — Other Ambulatory Visit: Payer: Self-pay

## 2021-09-21 ENCOUNTER — Emergency Department (HOSPITAL_COMMUNITY): Payer: BC Managed Care – PPO

## 2021-09-21 ENCOUNTER — Encounter (HOSPITAL_COMMUNITY): Payer: Self-pay | Admitting: Emergency Medicine

## 2021-09-21 DIAGNOSIS — R791 Abnormal coagulation profile: Secondary | ICD-10-CM | POA: Diagnosis not present

## 2021-09-21 DIAGNOSIS — E1159 Type 2 diabetes mellitus with other circulatory complications: Secondary | ICD-10-CM

## 2021-09-21 DIAGNOSIS — Z7984 Long term (current) use of oral hypoglycemic drugs: Secondary | ICD-10-CM | POA: Diagnosis not present

## 2021-09-21 DIAGNOSIS — N132 Hydronephrosis with renal and ureteral calculous obstruction: Secondary | ICD-10-CM | POA: Diagnosis not present

## 2021-09-21 DIAGNOSIS — I10 Essential (primary) hypertension: Secondary | ICD-10-CM | POA: Diagnosis not present

## 2021-09-21 DIAGNOSIS — K760 Fatty (change of) liver, not elsewhere classified: Secondary | ICD-10-CM | POA: Diagnosis not present

## 2021-09-21 DIAGNOSIS — M545 Low back pain, unspecified: Secondary | ICD-10-CM | POA: Diagnosis not present

## 2021-09-21 DIAGNOSIS — E119 Type 2 diabetes mellitus without complications: Secondary | ICD-10-CM | POA: Diagnosis not present

## 2021-09-21 DIAGNOSIS — N200 Calculus of kidney: Secondary | ICD-10-CM | POA: Insufficient documentation

## 2021-09-21 DIAGNOSIS — Z7902 Long term (current) use of antithrombotics/antiplatelets: Secondary | ICD-10-CM | POA: Insufficient documentation

## 2021-09-21 DIAGNOSIS — Z79899 Other long term (current) drug therapy: Secondary | ICD-10-CM | POA: Diagnosis not present

## 2021-09-21 DIAGNOSIS — Z794 Long term (current) use of insulin: Secondary | ICD-10-CM | POA: Insufficient documentation

## 2021-09-21 LAB — BASIC METABOLIC PANEL
Anion gap: 8 (ref 5–15)
BUN: 32 mg/dL — ABNORMAL HIGH (ref 6–20)
CO2: 22 mmol/L (ref 22–32)
Calcium: 9.2 mg/dL (ref 8.9–10.3)
Chloride: 109 mmol/L (ref 98–111)
Creatinine, Ser: 2.2 mg/dL — ABNORMAL HIGH (ref 0.61–1.24)
GFR, Estimated: 39 mL/min — ABNORMAL LOW (ref >60)
Glucose, Bld: 118 mg/dL — ABNORMAL HIGH (ref 70–99)
Potassium: 4.1 mmol/L (ref 3.5–5.1)
Sodium: 139 mmol/L (ref 135–145)

## 2021-09-21 LAB — CBC WITH DIFFERENTIAL/PLATELET
Abs Immature Granulocytes: 0.03 10*3/uL (ref 0.00–0.07)
Basophils Absolute: 0 10*3/uL (ref 0.0–0.1)
Basophils Relative: 0 %
Eosinophils Absolute: 0.1 10*3/uL (ref 0.0–0.5)
Eosinophils Relative: 1 %
HCT: 45.4 % (ref 39.0–52.0)
Hemoglobin: 14 g/dL (ref 13.0–17.0)
Immature Granulocytes: 0 %
Lymphocytes Relative: 17 %
Lymphs Abs: 2 10*3/uL (ref 0.7–4.0)
MCH: 25.6 pg — ABNORMAL LOW (ref 26.0–34.0)
MCHC: 30.8 g/dL (ref 30.0–36.0)
MCV: 83.2 fL (ref 80.0–100.0)
Monocytes Absolute: 0.8 10*3/uL (ref 0.1–1.0)
Monocytes Relative: 7 %
Neutro Abs: 9 10*3/uL — ABNORMAL HIGH (ref 1.7–7.7)
Neutrophils Relative %: 75 %
Platelets: 283 10*3/uL (ref 150–400)
RBC: 5.46 MIL/uL (ref 4.22–5.81)
RDW: 14.8 % (ref 11.5–15.5)
WBC: 12 10*3/uL — ABNORMAL HIGH (ref 4.0–10.5)
nRBC: 0 % (ref 0.0–0.2)

## 2021-09-21 LAB — URINALYSIS, ROUTINE W REFLEX MICROSCOPIC
Bacteria, UA: NONE SEEN
Bilirubin Urine: NEGATIVE
Glucose, UA: NEGATIVE mg/dL
Ketones, ur: NEGATIVE mg/dL
Leukocytes,Ua: NEGATIVE
Nitrite: NEGATIVE
Protein, ur: NEGATIVE mg/dL
Specific Gravity, Urine: 1.017 (ref 1.005–1.030)
pH: 6 (ref 5.0–8.0)

## 2021-09-21 LAB — PROTIME-INR
INR: 1.7 — ABNORMAL HIGH (ref 0.8–1.2)
Prothrombin Time: 20.1 seconds — ABNORMAL HIGH (ref 11.4–15.2)

## 2021-09-21 MED ORDER — ONDANSETRON HCL 4 MG/2ML IJ SOLN
4.0000 mg | Freq: Once | INTRAMUSCULAR | Status: AC
  Administered 2021-09-21: 4 mg via INTRAVENOUS
  Filled 2021-09-21: qty 2

## 2021-09-21 MED ORDER — CARVEDILOL 25 MG PO TABS: 25.0000 mg | ORAL_TABLET | Freq: Two times a day (BID) | ORAL | 1 refills | Status: DC

## 2021-09-21 MED ORDER — AMLODIPINE BESYLATE 10 MG PO TABS: 10.0000 mg | ORAL_TABLET | Freq: Every day | ORAL | 1 refills | Status: DC

## 2021-09-21 MED ORDER — HYDROCODONE-ACETAMINOPHEN 5-325 MG PO TABS: 1.0000 | ORAL_TABLET | Freq: Four times a day (QID) | ORAL | 0 refills | Status: DC | PRN

## 2021-09-21 MED ORDER — AMLODIPINE BESYLATE 5 MG PO TABS
10.0000 mg | ORAL_TABLET | Freq: Once | ORAL | Status: AC
  Administered 2021-09-21: 10 mg via ORAL
  Filled 2021-09-21: qty 2

## 2021-09-21 MED ORDER — ONDANSETRON HCL 4 MG PO TABS: 4.0000 mg | ORAL_TABLET | Freq: Three times a day (TID) | ORAL | 0 refills | Status: AC | PRN

## 2021-09-21 MED ORDER — HYDRALAZINE HCL 25 MG PO TABS
50.0000 mg | ORAL_TABLET | Freq: Once | ORAL | Status: AC
  Administered 2021-09-21: 50 mg via ORAL
  Filled 2021-09-21: qty 2

## 2021-09-21 MED ORDER — SODIUM CHLORIDE 0.9 % IV BOLUS
500.0000 mL | Freq: Once | INTRAVENOUS | Status: AC
  Administered 2021-09-21: 500 mL via INTRAVENOUS

## 2021-09-21 MED ORDER — LABETALOL HCL 5 MG/ML IV SOLN
10.0000 mg | Freq: Once | INTRAVENOUS | Status: AC
  Administered 2021-09-22: 10 mg via INTRAVENOUS
  Filled 2021-09-21: qty 4

## 2021-09-21 MED ORDER — KETOROLAC TROMETHAMINE 30 MG/ML IJ SOLN
30.0000 mg | Freq: Once | INTRAMUSCULAR | Status: AC
  Administered 2021-09-21: 30 mg via INTRAVENOUS
  Filled 2021-09-21: qty 1

## 2021-09-21 MED ORDER — HYDRALAZINE HCL 50 MG PO TABS: 50.0000 mg | ORAL_TABLET | Freq: Three times a day (TID) | ORAL | 1 refills | Status: DC

## 2021-09-21 NOTE — ED Triage Notes (Signed)
Pt reports left-sided lower back pain which radiates to the abdomen since yesterday. Denies urinary symptoms. Denies episodes of vomiting or diarrhea. Denies flank pain. Hx DM2, HTN

## 2021-09-21 NOTE — ED Provider Notes (Signed)
[facility] DEPT Provider Note   CSN: [id number] Arrival date & time: 09/21/21  1709     History  Chief Complaint  Patient presents with   Back Pain    William Moss is a 36 y.o. male.  HPI   36 year old male with pmhx of HTN, HLD, DM, obesity previous PE anticoagulated on Coumadin presents the emergency department with left lower back pain that radiates to the left abdomen.  No history of kidney stones.  Patient states that he had this pain for an episode yesterday, it then resolved and the pain returned today and has been persistent and worsening.  He describes it as left flank, radiating around to his left groin.  He has had some urinary hesitancy but denies any dysuria, hematuria.  He has had mild nausea without vomiting.  No diarrhea.  Denies any fever.  No midline spine tenderness, no recent lower back injury.  Home Medications Prior to Admission medications   Medication Sig Start Date End Date Taking? Authorizing Provider  amLODipine (NORVASC) 10 MG tablet Take 1 tablet (10 mg total) by mouth daily. 04/07/21 07/06/21  Charlott Rakes, MD  atorvastatin (LIPITOR) 40 MG tablet Take 1 tablet (40 mg total) by mouth daily. 04/10/21   Charlott Rakes, MD  Blood Glucose Monitoring Suppl (ONETOUCH VERIO REFLECT) w/Device KIT Check blood sugar TID E11.69 10/07/20   Charlott Rakes, MD  carvedilol (COREG) 25 MG tablet Take 1 tablet (25 mg total) by mouth 2 (two) times daily. 04/10/21   Charlott Rakes, MD  hydrALAZINE (APRESOLINE) 50 MG tablet Take 1 tablet (50 mg total) by mouth every 8 (eight) hours. 07/03/21 09/01/21  Hosie Poisson, MD  insulin aspart (NOVOLOG) 100 UNIT/ML injection Inject 10 Units into the skin 3 (three) times daily after meals. 07/03/21   Hosie Poisson, MD  insulin detemir (LEVEMIR) 100 UNIT/ML injection Inject 0.25 mLs (25 Units total) into the skin daily. 07/04/21   Hosie Poisson, MD  Insulin Pen Needle (BD PEN NEEDLE NANO U/F) 32G X 4 MM MISC 10  Units by Does not apply route daily. 10/07/20   Charlott Rakes, MD  Lancets (ONETOUCH DELICA PLUS [id number]) MISC CHECK BLOOD SUGAR 3 TIMES A DAY 01/13/21   Charlott Rakes, MD  metFORMIN (GLUCOPHAGE) 500 MG tablet Take 2 tablets (1,000 mg total) by mouth 2 (two) times daily with a meal. 04/10/21   Charlott Rakes, MD  warfarin (COUMADIN) 10 MG tablet Take warfarin 10 mg daily except on Monday and Friday, take only 5 mg . 07/03/21   Hosie Poisson, MD  lisinopril-hydrochlorothiazide (ZESTORETIC) 20-12.5 MG tablet Take 2 tablets by mouth daily. 07/22/20 08/27/20  Charlott Rakes, MD      Allergies    Lisinopril-hydrochlorothiazide    Review of Systems   Review of Systems  Constitutional:  Negative for fever.  Respiratory:  Negative for shortness of breath.   Cardiovascular:  Negative for chest pain.  Gastrointestinal:  Positive for abdominal pain and nausea. Negative for constipation, diarrhea and vomiting.  Genitourinary:  Positive for flank pain. Negative for difficulty urinating, dysuria, hematuria, penile discharge, scrotal swelling and testicular pain.  Musculoskeletal:  Positive for back pain. Negative for neck pain.  Skin:  Negative for rash.  Neurological:  Negative for headaches.   Physical Exam Updated Vital Signs BP (!) 197/124    Pulse (!) 104    Temp 98.5 F (36.9 C) (Oral)    Resp 18    SpO2 97%  Physical Exam Vitals  and nursing note reviewed.  Constitutional:      Appearance: Normal appearance.  HENT:     Head: Normocephalic.     Mouth/Throat:     Mouth: Mucous membranes are moist.  Cardiovascular:     Rate and Rhythm: Normal rate.  Pulmonary:     Effort: Pulmonary effort is normal. No respiratory distress.  Abdominal:     Palpations: Abdomen is soft.     Tenderness: There is no abdominal tenderness. There is left CVA tenderness. There is no guarding.  Musculoskeletal:     Comments: No midline spinal TTP, no rash  Skin:    General: Skin is warm.  Neurological:      Mental Status: He is alert and oriented to person, place, and time. Mental status is at baseline.  Psychiatric:        Mood and Affect: Mood normal.    ED Results / Procedures / Treatments   Labs (all labs ordered are listed, but only abnormal results are displayed) Labs Reviewed  CBC WITH DIFFERENTIAL/PLATELET  BASIC METABOLIC PANEL  URINALYSIS, ROUTINE W REFLEX MICROSCOPIC    EKG None  Radiology No results found.  Procedures Procedures    Medications Ordered in ED Medications  sodium chloride 0.9 % bolus 500 mL (has no administration in time range)  ketorolac (TORADOL) 30 MG/ML injection 30 mg (has no administration in time range)  ondansetron (ZOFRAN) injection 4 mg (has no administration in time range)    ED Course/ Medical Decision Making/ A&P                           Medical Decision Making Amount and/or Complexity of Data Reviewed Labs: ordered. Radiology: ordered.  Risk Prescription drug management.   36 year old male presents emergency department with left-sided lower back pain that radiates around to his left groin.  No history of kidney stones.  Tachycardic on arrival and uncomfortable appearing.  Hypertensive but has been without his HTN medications for a couple weeks.  States that he has his Coumadin has been compliant.  Blood work shows worse than baseline AKI.  Urinalysis has blood in it.  CT renal study identifies a left-sided 4 mm renal stone with mild hydronephrosis.  No UTI.  Due to the mild AKI Dr. Tresa Moore from urology was consulted.  Since the patient is otherwise young, healthy, not on an ACE inhibitor with a normal right kidney he believes he can go home with the appropriate treatment with anticipation of passing the stone without difficulty.  Strict return to ED precautions in regards to stone obstruction/infection/pyelonephritis and sepsis have been discussed with the patient.  After Toradol he is completely pain-free, tolerating p.o.  Tachycardia  resolved, VS otherwise normal, on RA. Plan for outpatient treatment.  Patient at this time appears safe and stable for discharge and close outpatient follow up. Discharge plan and strict return to ED precautions discussed, patient verbalizes understanding and agreement.        Final Clinical Impression(s) / ED Diagnoses Final diagnoses:  None    Rx / DC Orders ED Discharge Orders     None         Lorelle Gibbs, DO 09/22/21 1820

## 2021-09-22 NOTE — ED Notes (Signed)
Pt discharged. Instructions and prescriptions given. AAOX4. Pt in no apparent distress or pain. The opportunity to ask questions was provided.  

## 2021-09-23 ENCOUNTER — Encounter: Payer: Self-pay | Admitting: Pulmonary Disease

## 2021-09-23 ENCOUNTER — Ambulatory Visit (HOSPITAL_COMMUNITY): Payer: BC Managed Care – PPO | Attending: Cardiology

## 2021-09-23 ENCOUNTER — Encounter (HOSPITAL_BASED_OUTPATIENT_CLINIC_OR_DEPARTMENT_OTHER): Payer: Self-pay | Admitting: Cardiovascular Disease

## 2021-09-23 ENCOUNTER — Other Ambulatory Visit: Payer: Self-pay

## 2021-09-23 DIAGNOSIS — I2609 Other pulmonary embolism with acute cor pulmonale: Secondary | ICD-10-CM

## 2021-09-23 LAB — ECHOCARDIOGRAM LIMITED
Area-P 1/2: 4.6 cm2
S' Lateral: 3.2 cm

## 2021-09-23 NOTE — Telephone Encounter (Signed)
Dr. Judeth Horn, patient sent you a message asking about echo results and what they mean.   Message from patient- Hello. I just wanted to know what these tests results mean?    Message routed to Dr. Judeth Horn to advise

## 2021-10-02 ENCOUNTER — Ambulatory Visit: Payer: BC Managed Care – PPO | Attending: Family Medicine | Admitting: Pharmacist

## 2021-10-02 DIAGNOSIS — I82491 Acute embolism and thrombosis of other specified deep vein of right lower extremity: Secondary | ICD-10-CM

## 2021-10-02 LAB — POCT INR: INR: 1.8 — AB (ref 2.0–3.0)

## 2021-10-05 ENCOUNTER — Other Ambulatory Visit: Payer: Self-pay | Admitting: Family Medicine

## 2021-10-05 DIAGNOSIS — E1169 Type 2 diabetes mellitus with other specified complication: Secondary | ICD-10-CM

## 2021-10-05 DIAGNOSIS — Z794 Long term (current) use of insulin: Secondary | ICD-10-CM

## 2021-10-06 NOTE — Telephone Encounter (Signed)
Requested medication (s) are due for refill today: yes  Requested medication (s) are on the active medication list: yes  Last refill:  04/10/21 #360 with 1 RF  Future visit scheduled: no, was asked to return in 3 weeks to see PCP (3 days for INR)  Notes to clinic: Last OV 04/10/21 pt was asked to return in 3 weeks for DM, and has not, no upcoming appt, please assess.     Requested Prescriptions  Pending Prescriptions Disp Refills   metFORMIN (GLUCOPHAGE) 500 MG tablet [Pharmacy Med Name: METFORMIN HCL 500 MG TABLET] 360 tablet 1    Sig: Take 2 tablets (1,000 mg total) by mouth 2 (two) times daily with a meal.     Endocrinology:  Diabetes - Biguanides Failed - 10/05/2021 12:06 AM      Failed - Cr in normal range and within 360 days    Creatinine  Date Value Ref Range Status  07/25/2021 1.34 (H) 0.61 - 1.24 mg/dL Final   Creat  Date Value Ref Range Status  08/28/2016 0.92 0.60 - 1.35 mg/dL Final   Creatinine, Ser  Date Value Ref Range Status  09/21/2021 2.20 (H) 0.61 - 1.24 mg/dL Final   Creatinine, Urine  Date Value Ref Range Status  01/09/2016 361 20 - 370 mg/dL Final    Comment:    Result confirmed by automatic dilution. Result repeated and verified.           Failed - HBA1C is between 0 and 7.9 and within 180 days    HbA1c, POC (controlled diabetic range)  Date Value Ref Range Status  10/07/2020 13.4 (A) 0.0 - 7.0 % Final   Hgb A1c MFr Bld  Date Value Ref Range Status  06/24/2021 10.8 (H) 4.8 - 5.6 % Final    Comment:    (NOTE) Pre diabetes:          5.7%-6.4%  Diabetes:              >6.4%  Glycemic control for   <7.0% adults with diabetes           Failed - eGFR in normal range and within 360 days    GFR, Est African American  Date Value Ref Range Status  08/28/2016 >89 >=60 mL/min Final   GFR calc Af Amer  Date Value Ref Range Status  10/07/2020 62 >59 mL/min/1.73 Final    Comment:    **In accordance with recommendations from the NKF-ASN Task  force,**   [facility] is in the process of updating its eGFR calculation to the   2021 CKD-EPI creatinine equation that estimates kidney function   without a race variable.    GFR, Est Non African American  Date Value Ref Range Status  08/28/2016 >89 >=60 mL/min Final   GFR, Estimated  Date Value Ref Range Status  09/21/2021 39 (L) >60 mL/min Final    Comment:    (NOTE) Calculated using the CKD-EPI Creatinine Equation (2021)   07/25/2021 >60 >60 mL/min Final    Comment:    (NOTE) Calculated using the CKD-EPI Creatinine Equation (2021)    eGFR  Date Value Ref Range Status  04/17/2021 69 >59 mL/min/1.73 Final          Failed - B12 Level in normal range and within 720 days    No results found for: VITAMINB12        Failed - CBC within normal limits and completed in the last 12 months    WBC  Date Value  Ref Range Status  09/21/2021 12.0 (H) 4.0 - 10.5 K/uL Final   RBC  Date Value Ref Range Status  09/21/2021 5.46 4.22 - 5.81 MIL/uL Final   Hemoglobin  Date Value Ref Range Status  09/21/2021 14.0 13.0 - 17.0 g/dL Final  07/25/2021 12.7 (L) 13.0 - 17.0 g/dL Final   HCT  Date Value Ref Range Status  09/21/2021 45.4 39.0 - 52.0 % Final   MCHC  Date Value Ref Range Status  09/21/2021 30.8 30.0 - 36.0 g/dL Final   MCH  Date Value Ref Range Status  09/21/2021 25.6 (L) 26.0 - 34.0 pg Final   MCV  Date Value Ref Range Status  09/21/2021 83.2 80.0 - 100.0 fL Final   No results found for: PLTCOUNTKUC, LABPLAT, POCPLA RDW  Date Value Ref Range Status  09/21/2021 14.8 11.5 - 15.5 % Final         Passed - Valid encounter within last 6 months    Recent Outpatient Visits           3 months ago Acute respiratory failure with hypoxia ([place])   [place], Charlane Ferretti, MD   5 months ago Morbid obesity [facility]   [place], Charlane Ferretti, MD   12 months ago Type 2 diabetes mellitus with other specified  complication, with long-term current use of insulin ([place])   [place], Enobong, MD   1 year ago Hypertension associated with diabetes [facility])   [place], Jarome Matin, RPH-CPP   1 year ago Hypertension associated with diabetes [facility])   [place], Enobong, MD       Future Appointments             In 1 week [place], Bonna Gains, MD [facility]

## 2021-10-07 ENCOUNTER — Other Ambulatory Visit: Payer: Self-pay | Admitting: Family Medicine

## 2021-10-07 ENCOUNTER — Encounter: Payer: Self-pay | Admitting: Family Medicine

## 2021-10-07 DIAGNOSIS — E1169 Type 2 diabetes mellitus with other specified complication: Secondary | ICD-10-CM

## 2021-10-07 DIAGNOSIS — Z794 Long term (current) use of insulin: Secondary | ICD-10-CM

## 2021-10-07 MED ORDER — METFORMIN HCL 500 MG PO TABS: 1000.0000 mg | ORAL_TABLET | Freq: Two times a day (BID) | ORAL | 0 refills | Status: DC

## 2021-10-07 MED ORDER — WARFARIN SODIUM 10 MG PO TABS: ORAL_TABLET | ORAL | 1 refills | Status: DC

## 2021-10-15 ENCOUNTER — Ambulatory Visit (INDEPENDENT_AMBULATORY_CARE_PROVIDER_SITE_OTHER): Payer: BC Managed Care – PPO | Admitting: Pulmonary Disease

## 2021-10-15 ENCOUNTER — Other Ambulatory Visit: Payer: Self-pay

## 2021-10-15 ENCOUNTER — Ambulatory Visit: Payer: BC Managed Care – PPO

## 2021-10-15 ENCOUNTER — Encounter: Payer: Self-pay | Admitting: Pulmonary Disease

## 2021-10-15 VITALS — BP 130/72 | HR 92 | Temp 98.5°F | Ht 69.0 in | Wt 382.4 lb

## 2021-10-15 DIAGNOSIS — I371 Nonrheumatic pulmonary valve insufficiency: Secondary | ICD-10-CM | POA: Diagnosis not present

## 2021-10-15 DIAGNOSIS — I519 Heart disease, unspecified: Secondary | ICD-10-CM

## 2021-10-15 DIAGNOSIS — J9601 Acute respiratory failure with hypoxia: Secondary | ICD-10-CM

## 2021-10-15 DIAGNOSIS — I2609 Other pulmonary embolism with acute cor pulmonale: Secondary | ICD-10-CM

## 2021-10-15 NOTE — Progress Notes (Signed)
_0  ID: William Moss, male    DOB: 02-Aug-1986, 36 y.o.   MRN: [id number]  Chief Complaint  Patient presents with   Follow-up    Follow up. Pt states a few weeks ago he had a test for his heart. Pt states since last visit he has been doing better. Pt is still on the Warfarin     Referring provider: Charlott Rakes, MD  HPI:   36 y.o. man with submassive PE s/p TPA presents to clinic for follow up of PE.  Overall, he is doing.  Still being cautious with exertion.  Nervous about the clot better or not.  Overall doing fine.  Discussed at length results of recent follow-up TTE.  This was reviewed.  Demonstrates left atrial dilation, RV enlargement, RV function mildly reduced.  Discussed at length this likely reflects untreated sleep apnea as he has been off his CPAP for some time, concern for predisposition to volume overload given his left atrial dilation.  Given recent blood clot, discussed role and rationale for additional imaging to assess for resolution of clot.  He expressed understanding.  HPI at initial visit: Patient presented with long hospital October 2022.  Chest pain, shortness of breath.  CTA PE protocol revealed significant clot burden as well as bilateral left greater than right infiltrates on my review interpretation.  Evidence of heart strain on CT scan.  History of anticoagulation.  Despite this, worsening symptoms and hypoxemia that was refractory and severe.  Echocardiogram was performed but RV could not be visualized on my review.  Given worsening clinical status, systemic tPA was administered.  He had rapid improvement in symptoms.  She returns to clinic today for follow-up.  He is on warfarin which was chosen given his morbid obesity and concern for unreliable anticoagulation with factor Xa antagonist.  He is doing well.  Dyspnea gradually improved.  Still has some residual dyspnea on exertion.  Mainly with longer walks or inclines or stairs.   Questionaires /  Pulmonary Flowsheets:   ACT:  No flowsheet data found.  MMRC: mMRC Dyspnea Scale mMRC Score  08/13/2021 3    Epworth:  No flowsheet data found.  Tests:   FENO:  No results found for: NITRICOXIDE  PFT: No flowsheet data found.  WALK:  No flowsheet data found.  Imaging: Personally reviewed and as per EMR CT Renal Stone Study  Result Date: 09/21/2021 CLINICAL DATA:  Left-sided flank pain EXAM: CT ABDOMEN AND PELVIS WITHOUT CONTRAST TECHNIQUE: Multidetector CT imaging of the abdomen and pelvis was performed following the standard protocol without IV contrast. RADIATION DOSE REDUCTION: This exam was performed according to the departmental dose-optimization program which includes automated exposure control, adjustment of the mA and/or kV according to patient size and/or use of iterative reconstruction technique. COMPARISON:  None. FINDINGS: Lower chest: No acute abnormality. Hepatobiliary: Hepatic steatosis. Gallbladder appears contracted. No calcified stone or biliary dilatation Pancreas: Unremarkable. No pancreatic ductal dilatation or surrounding inflammatory changes. Spleen: Normal in size without focal abnormality. Adrenals/Urinary Tract: Adrenal glands are normal. Punctate intrarenal stones bilaterally. Mild left hydronephrosis and hydroureter, secondary to a 4 mm stone within the proximal left ureter about 3 cm distal to the left UPJ. The bladder is unremarkable. Stomach/Bowel: Stomach is within normal limits. Appendix appears normal. No evidence of bowel wall thickening, distention, or inflammatory changes. 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 Vascular/Lymphatic: No significant vascular findings are present. No enlarged abdominal or pelvic lymph nodes. Reproductive: Prostate is unremarkable. Other:  Negative for pelvic effusion or free air. Musculoskeletal: No acute or significant osseous findings. IMPRESSION: 1. Mild left hydronephrosis and hydroureter,  secondary to a 4 mm stone in the proximal left ureter about 3 cm distal to the left UVJ 2. Punctate intrarenal stones bilaterally 3. Hepatic steatosis 4. Diverticular disease of the colon without acute inflammatory change Electronically Signed   By: Donavan Foil M.D.   On: 09/21/2021 23:03   ECHOCARDIOGRAM LIMITED  Result Date: 09/23/2021    ECHOCARDIOGRAM LIMITED REPORT   Patient Name:   William Moss Parmer Date of Exam: 09/23/2021 Medical Rec #:  [id number]     Height:       69.0 in Accession #:    [id number]    Weight:       380.0 lb Date of Birth:  1986-02-03    BSA:          2.714 m Patient Age:    39 years      BP:           132/78 mmHg Patient Gender: M             HR:           77 bpm. Exam Location:  [place] Procedure: 2D Echo, Cardiac Doppler and Color Doppler Indications:    I26.09 Pulmonary embolus  History:        Patient has prior history of Echocardiogram examinations, most                 recent 06/23/2021. Risk Factors:Hypertension, Diabetes and                 Morbid Obesity. Pulmonary embolism. Chest pain.  Sonographer:    Cresenciano Lick RDCS Referring Phys: [place]  1. Left ventricular ejection fraction, by estimation, is 60 to 65%. The left ventricle has normal function. The left ventricle has no regional wall motion abnormalities. There is severe left ventricular hypertrophy. Left ventricular diastolic parameters  are consistent with Grade I diastolic dysfunction (impaired relaxation).  2. Right ventricular systolic function is mildly reduced. The right ventricular size is moderately enlarged.  3. Left atrial size was mildly dilated.  4. The mitral valve is normal in structure. Mild mitral valve regurgitation. No evidence of mitral stenosis.  5. The aortic valve is normal in structure. Aortic valve regurgitation is not visualized.  6. The inferior vena cava is normal in size with greater than 50% respiratory variability, suggesting right atrial  pressure of 3 mmHg. FINDINGS  Left Ventricle: Left ventricular ejection fraction, by estimation, is 60 to 65%. The left ventricle has normal function. The left ventricle has no regional wall motion abnormalities. The left ventricular internal cavity size was normal in size. There is  severe left ventricular hypertrophy. Left ventricular diastolic parameters are consistent with Grade I diastolic dysfunction (impaired relaxation). Right Ventricle: The right ventricular size is moderately enlarged. Right ventricular systolic function is mildly reduced. Left Atrium: Left atrial size was mildly dilated. Right Atrium: Right atrial size was normal in size. Pericardium: There is no evidence of pericardial effusion. Mitral Valve: The mitral valve is normal in structure. Mild mitral valve regurgitation. No evidence of mitral valve stenosis. Tricuspid Valve: The tricuspid valve is not well visualized. Aortic Valve: The aortic valve is normal in structure. Aortic valve regurgitation is not visualized. Pulmonic Valve: The pulmonic valve was not well visualized. Venous: The inferior vena cava is normal in size with greater than 50% respiratory variability,  suggesting right atrial pressure of 3 mmHg. IAS/Shunts: No atrial level shunt detected by color flow Doppler. LEFT VENTRICLE PLAX 2D LVIDd:         4.80 cm   Diastology LVIDs:         3.20 cm   LV e' medial:    5.55 cm/s LV PW:         1.40 cm   LV E/e' medial:  19.3 LV IVS:        2.00 cm   LV e' lateral:   9.68 cm/s LVOT diam:     2.30 cm   LV E/e' lateral: 11.1 LV SV:         76 LV SV Index:   28 LVOT Area:     4.15 cm  RIGHT VENTRICLE RV Basal diam:  4.50 cm RV S prime:     10.80 cm/s TAPSE (M-mode): 1.3 cm LEFT ATRIUM             Index        RIGHT ATRIUM           Index LA diam:        4.40 cm 1.62 cm/m   RA Area:     14.90 cm LA Vol (A2C):   61.9 ml 22.81 ml/m  RA Volume:   39.60 ml  14.59 ml/m LA Vol (A4C):   59.7 ml 22.00 ml/m LA Biplane Vol: 62.3 ml 22.96 ml/m   AORTIC VALVE LVOT Vmax:   92.17 cm/s LVOT Vmean:  63.400 cm/s LVOT VTI:    0.183 m  AORTA Ao Root diam: 3.60 cm Ao Asc diam:  3.30 cm MITRAL VALVE MV Area (PHT): 4.60 cm     SHUNTS MV Decel Time: 165 msec     Systemic VTI:  0.18 m MV E velocity: 107.00 cm/s  Systemic Diam: 2.30 cm MV A velocity: 101.00 cm/s MV E/A ratio:  1.06 Candee Furbish MD Electronically signed by Candee Furbish MD Signature Date/Time: 09/23/2021/1:07:02 PM    Final     Lab Results: Personally reviewed CBC    Component Value Date/Time   WBC 12.0 (H) 09/21/2021 1958   RBC 5.46 09/21/2021 1958   HGB 14.0 09/21/2021 1958   HGB 12.7 (L) 07/25/2021 1202   HCT 45.4 09/21/2021 1958   PLT 283 09/21/2021 1958   PLT 160 07/25/2021 1202   MCV 83.2 09/21/2021 1958   MCH 25.6 (L) 09/21/2021 1958   MCHC 30.8 09/21/2021 1958   RDW 14.8 09/21/2021 1958   LYMPHSABS 2.0 09/21/2021 1958   MONOABS 0.8 09/21/2021 1958   EOSABS 0.1 09/21/2021 1958   BASOSABS 0.0 09/21/2021 1958    BMET    Component Value Date/Time   NA 139 09/21/2021 2215   NA 141 04/17/2021 1011   K 4.1 09/21/2021 2215   CL 109 09/21/2021 2215   CO2 22 09/21/2021 2215   GLUCOSE 118 (H) 09/21/2021 2215   BUN 32 (H) 09/21/2021 2215   BUN 21 (H) 04/17/2021 1011   CREATININE 2.20 (H) 09/21/2021 2215   CREATININE 1.34 (H) 07/25/2021 1202   CREATININE 0.92 08/28/2016 1000   CALCIUM 9.2 09/21/2021 2215   GFRNONAA 39 (L) 09/21/2021 2215   GFRNONAA >60 07/25/2021 1202   GFRNONAA >89 08/28/2016 1000   GFRAA 62 10/07/2020 1140   GFRAA >89 08/28/2016 1000    BNP    Component Value Date/Time   BNP 69.6 06/23/2021 0029    ProBNP No results found for: PROBNP  Specialty  Problems       Pulmonary Problems   Sleep apnea   Acute respiratory failure with hypoxia (HCC)   Pneumonia    Allergies  Allergen Reactions   Lisinopril-Hydrochlorothiazide Other (See Comments)    "Interfered with the pH level in the serum"    Immunization History  Administered  Date(s) Administered   Influenza,inj,Quad PF,6+ Mos 07/29/2015, 08/28/2016, 09/17/2017, 09/13/2018, 05/15/2019, 05/30/2020   Influenza-Unspecified 07/15/2021   PFIZER(Purple Top)SARS-COV-2 Vaccination 11/26/2019, 12/26/2019, 07/01/2020   Pfizer Covid-19 Vaccine Bivalent Booster 56yr & up 08/21/2021   Pneumococcal Polysaccharide-23 08/28/2015   Tdap 05/15/2019    Past Medical History:  Diagnosis Date   Atypical chest pain 05/27/2016   a. 05/2016: NST showing small area of moderare anteroapical ischemia b. 05/2016: cath showing tortous but normal cors which confirmed a false positive NST.   Diabetes mellitus without complication ([facility]    GERD (gastroesophageal reflux disease) 10/02/2016   Hypertension    Pulmonary embolism, bilateral (HCC)     Tobacco History: Social History   Tobacco Use  Smoking Status Never  Smokeless Tobacco Never   Counseling given: Not Answered   Continue to not smoke  Outpatient Encounter Medications as of 10/15/2021  Medication Sig   amLODipine (NORVASC) 10 MG tablet Take 1 tablet (10 mg total) by mouth daily.   atorvastatin (LIPITOR) 40 MG tablet Take 1 tablet (40 mg total) by mouth daily.   Blood Glucose Monitoring Suppl (ONETOUCH VERIO REFLECT) w/Device KIT Check blood sugar TID E11.69   carvedilol (COREG) 25 MG tablet Take 1 tablet (25 mg total) by mouth 2 (two) times daily.   hydrALAZINE (APRESOLINE) 50 MG tablet Take 1 tablet (50 mg total) by mouth every 8 (eight) hours.   HYDROcodone-acetaminophen (NORCO) 5-325 MG tablet Take 1 tablet by mouth every 6 (six) hours as needed for moderate pain.   insulin aspart (NOVOLOG) 100 UNIT/ML injection Inject 10 Units into the skin 3 (three) times daily after meals.   insulin detemir (LEVEMIR) 100 UNIT/ML injection Inject 0.25 mLs (25 Units total) into the skin daily.   Insulin Pen Needle (BD PEN NEEDLE NANO U/F) 32G X 4 MM MISC 10 Units by Does not apply route daily.   Lancets (ONETOUCH DELICA PLUS  [id number] MISC CHECK BLOOD SUGAR 3 TIMES A DAY   metFORMIN (GLUCOPHAGE) 500 MG tablet Take 2 tablets (1,000 mg total) by mouth 2 (two) times daily with a meal.   ondansetron (ZOFRAN) 4 MG tablet Take 1 tablet (4 mg total) by mouth every 8 (eight) hours as needed for nausea or vomiting.   warfarin (COUMADIN) 10 MG tablet Take warfarin 10 mg daily except on Monday and Friday, take only 5 mg .   [DISCONTINUED] lisinopril-hydrochlorothiazide (ZESTORETIC) 20-12.5 MG tablet Take 2 tablets by mouth daily.   No facility-administered encounter medications on file as of 10/15/2021.     Review of Systems  Review of Systems  No chest pain.  No orthopnea or PND.  No black or tarry stools.  No frank blood in stools.  No hemoptysis, no hematemesis.  Comprehensive review of systems otherwise negative. Physical Exam  BP 130/72 (BP Location: Left Arm, Patient Position: Sitting, Cuff Size: Normal)    Pulse 92    Temp 98.5 F (36.9 C) (Oral)    Ht _0  (1.753 m)    Wt (!) 382 lb 6.4 oz (173.5 kg)    SpO2 100%    BMI 56.47 kg/m   Wt Readings from Last 5 Encounters:  10/15/21 (Marland Kitchen  382 lb 6.4 oz (173.5 kg)  08/13/21 (!) 380 lb (172.4 kg)  07/25/21 (!) 374 lb 12.8 oz (170 kg)  07/08/21 (!) 368 lb (166.9 kg)  07/03/21 (!) 359 lb 12.7 oz (163.2 kg)    BMI Readings from Last 5 Encounters:  10/15/21 56.47 kg/m  08/13/21 56.12 kg/m  07/25/21 55.35 kg/m  07/08/21 54.34 kg/m  07/03/21 53.13 kg/m     Physical Exam General: Obese, sitting in exam chair Eyes: EOMI, icterus Neck: Supple, no JVP Pulmonary: Clear, distant Cardiovascular: Regular rate and rhythm, no murmur appreciated Abdomen: Nondistended, bowel sounds present MSK: No synovitis, no joint effusion Neuro: Normal gait, no weakness patient Psych: Normal mood, full affect   Assessment & Plan:   Submassive PE: With severe refractory hypoxemia status post systemic tPA.  Symptoms improved over time.  Still residual dyspnea. Unsuccessful  in determining RV function and size etc. during hospitalization.  TTE 08/2021 able to view RV and did demonstrate mild enlargement and decreased function, as well as left atrial dilation.  He is  to continue warfarin.  Recommend lifelong anticoagulation plan to at this time.  Given RV changes, VQ scan ordered to evaluate resolution of clot.  RV dysfunction: Likely multifactorial in setting of untreated severe OSA, LA dilation at risk for volume overload, or possible chronic PE.  Repeat orders for new supplies for OSA/CPAP machine.  Ideally would repeat TTE after several months of CPAP adherence.  May need to change timeline if chronic clot is seen on VQ scan.    Return in about 3 months (around 01/12/2022).   Lanier Clam, MD 10/15/2021   This appointment required 65 minutes of patient care (this includes precharting, chart review, review of results, face-to-face care, etc.).

## 2021-10-15 NOTE — Patient Instructions (Addendum)
Nice to see you again  We will order a scan to make sure the clot has gone away  We will order CPAP supplies and see if [facility] can service the machine  There is slight stress on the heart that is most likely from sleep apnea but think it is wise to check to make sure blood clot is better.  Return to clinic in 3 months or sooner as needed

## 2021-10-16 ENCOUNTER — Ambulatory Visit: Payer: BC Managed Care – PPO | Attending: Family Medicine | Admitting: Pharmacist

## 2021-10-16 DIAGNOSIS — I2609 Other pulmonary embolism with acute cor pulmonale: Secondary | ICD-10-CM | POA: Diagnosis not present

## 2021-10-16 LAB — POCT INR: INR: 1.8 — AB (ref 2.0–3.0)

## 2021-10-23 DIAGNOSIS — G4733 Obstructive sleep apnea (adult) (pediatric): Secondary | ICD-10-CM | POA: Diagnosis not present

## 2021-10-23 DIAGNOSIS — I2699 Other pulmonary embolism without acute cor pulmonale: Secondary | ICD-10-CM | POA: Diagnosis not present

## 2021-10-23 DIAGNOSIS — J189 Pneumonia, unspecified organism: Secondary | ICD-10-CM | POA: Diagnosis not present

## 2021-10-27 ENCOUNTER — Other Ambulatory Visit: Payer: Self-pay

## 2021-10-27 ENCOUNTER — Telehealth: Payer: Self-pay | Admitting: Pulmonary Disease

## 2021-10-27 ENCOUNTER — Ambulatory Visit (HOSPITAL_COMMUNITY)
Admission: RE | Admit: 2021-10-27 | Discharge: 2021-10-27 | Disposition: A | Discharge status: Home / Self Care | Payer: BC Managed Care – PPO | Source: Ambulatory Visit | Attending: Pulmonary Disease | Admitting: Pulmonary Disease

## 2021-10-27 DIAGNOSIS — I2609 Other pulmonary embolism with acute cor pulmonale: Secondary | ICD-10-CM | POA: Insufficient documentation

## 2021-10-27 DIAGNOSIS — I517 Cardiomegaly: Secondary | ICD-10-CM | POA: Diagnosis not present

## 2021-10-27 DIAGNOSIS — I371 Nonrheumatic pulmonary valve insufficiency: Secondary | ICD-10-CM | POA: Insufficient documentation

## 2021-10-27 DIAGNOSIS — I2699 Other pulmonary embolism without acute cor pulmonale: Secondary | ICD-10-CM | POA: Diagnosis not present

## 2021-10-27 DIAGNOSIS — I519 Heart disease, unspecified: Secondary | ICD-10-CM | POA: Diagnosis not present

## 2021-10-27 MED ORDER — TECHNETIUM TO 99M ALBUMIN AGGREGATED
4.0000 | Freq: Once | INTRAVENOUS | Status: AC
  Administered 2021-10-27: 4.25 via INTRAVENOUS

## 2021-10-27 NOTE — Telephone Encounter (Signed)
This is for the NM prefusion scan. Please advise.

## 2021-10-28 NOTE — Telephone Encounter (Signed)
Perfusion scan shows signs of old clots left behind after PE in October 2022. Can we get a f/u next available with me to discuss next steps.

## 2021-10-29 NOTE — Telephone Encounter (Signed)
Called and left voicemail for patient to call office back in regards to perfusion scan results.  ?

## 2021-10-30 ENCOUNTER — Ambulatory Visit: Payer: BC Managed Care – PPO | Attending: Family Medicine | Admitting: Pharmacist

## 2021-10-30 ENCOUNTER — Other Ambulatory Visit: Payer: Self-pay

## 2021-10-30 ENCOUNTER — Encounter: Payer: Self-pay | Admitting: Pulmonary Disease

## 2021-10-30 DIAGNOSIS — J9601 Acute respiratory failure with hypoxia: Secondary | ICD-10-CM

## 2021-10-30 LAB — POCT INR: INR: 1.7 — AB (ref 2.0–3.0)

## 2021-10-30 MED ORDER — WARFARIN SODIUM 10 MG PO TABS: ORAL_TABLET | ORAL | 1 refills | Status: DC

## 2021-10-30 NOTE — Telephone Encounter (Signed)
Called patient to call office back.

## 2021-11-07 ENCOUNTER — Ambulatory Visit: Payer: BC Managed Care – PPO | Admitting: Pulmonary Disease

## 2021-11-07 ENCOUNTER — Encounter: Payer: Self-pay | Admitting: Pulmonary Disease

## 2021-11-07 ENCOUNTER — Other Ambulatory Visit: Payer: Self-pay

## 2021-11-07 VITALS — BP 132/70 | HR 87 | Temp 98.4°F | Ht 69.0 in | Wt 390.0 lb

## 2021-11-07 DIAGNOSIS — I2729 Other secondary pulmonary hypertension: Secondary | ICD-10-CM | POA: Diagnosis not present

## 2021-11-07 DIAGNOSIS — I2782 Chronic pulmonary embolism: Secondary | ICD-10-CM

## 2021-11-07 NOTE — Patient Instructions (Signed)
Nice to see you again ? ?The perfusion scan indicated there is evidence of blood clots remaining in the lungs despite using blood thinners. ? ?Given this and some of the changes on the heart ultrasound recently, I recommend referral to [facility] for evaluation of a procedure that can help with the clots in the lungs.  This referral was placed today.  Please contact us if you not heard from them in the next 2 weeks. ? ?To further work-up some of the changes on the heart ultrasound, I recommend a home sleep study.  This was ordered.  It is taking this 3 to 4 months to get the scheduled so do not worry if you do not hear back from [place] for several weeks. ? ?Keep currently scheduled follow-up appointment in May 2023. ?

## 2021-11-20 DIAGNOSIS — J189 Pneumonia, unspecified organism: Secondary | ICD-10-CM | POA: Diagnosis not present

## 2021-11-20 DIAGNOSIS — G4733 Obstructive sleep apnea (adult) (pediatric): Secondary | ICD-10-CM | POA: Diagnosis not present

## 2021-11-20 DIAGNOSIS — I2699 Other pulmonary embolism without acute cor pulmonale: Secondary | ICD-10-CM | POA: Diagnosis not present

## 2021-11-24 ENCOUNTER — Ambulatory Visit: Payer: BC Managed Care – PPO | Attending: Family Medicine | Admitting: Family Medicine

## 2021-11-24 ENCOUNTER — Telehealth: Payer: Self-pay | Admitting: Pulmonary Disease

## 2021-11-24 ENCOUNTER — Ambulatory Visit: Payer: BC Managed Care – PPO | Admitting: Pharmacist

## 2021-11-24 ENCOUNTER — Other Ambulatory Visit: Payer: Self-pay

## 2021-11-24 ENCOUNTER — Other Ambulatory Visit: Payer: Self-pay | Admitting: Family Medicine

## 2021-11-24 ENCOUNTER — Encounter: Payer: Self-pay | Admitting: Family Medicine

## 2021-11-24 VITALS — BP 157/94 | HR 84 | Ht 69.0 in | Wt 390.4 lb

## 2021-11-24 DIAGNOSIS — E1169 Type 2 diabetes mellitus with other specified complication: Secondary | ICD-10-CM

## 2021-11-24 DIAGNOSIS — Z794 Long term (current) use of insulin: Secondary | ICD-10-CM

## 2021-11-24 DIAGNOSIS — N179 Acute kidney failure, unspecified: Secondary | ICD-10-CM

## 2021-11-24 DIAGNOSIS — E119 Type 2 diabetes mellitus without complications: Secondary | ICD-10-CM

## 2021-11-24 DIAGNOSIS — I2782 Chronic pulmonary embolism: Secondary | ICD-10-CM | POA: Diagnosis not present

## 2021-11-24 DIAGNOSIS — E1159 Type 2 diabetes mellitus with other circulatory complications: Secondary | ICD-10-CM

## 2021-11-24 DIAGNOSIS — I152 Hypertension secondary to endocrine disorders: Secondary | ICD-10-CM | POA: Diagnosis not present

## 2021-11-24 DIAGNOSIS — E669 Obesity, unspecified: Secondary | ICD-10-CM | POA: Diagnosis not present

## 2021-11-24 DIAGNOSIS — Z6841 Body Mass Index (BMI) 40.0 and over, adult: Secondary | ICD-10-CM

## 2021-11-24 LAB — POCT GLYCOSYLATED HEMOGLOBIN (HGB A1C): HbA1c, POC (controlled diabetic range): 7.6 % — AB (ref 0.0–7.0)

## 2021-11-24 LAB — GLUCOSE, POCT (MANUAL RESULT ENTRY): POC Glucose: 147 mg/dl — AB (ref 70–99)

## 2021-11-24 LAB — POCT INR: INR: 2.4 (ref 2.0–3.0)

## 2021-11-24 MED ORDER — OZEMPIC (0.25 OR 0.5 MG/DOSE) 2 MG/1.5ML ~~LOC~~ SOPN: 0.2500 mg | PEN_INJECTOR | SUBCUTANEOUS | 6 refills | Status: DC

## 2021-11-24 MED ORDER — OZEMPIC (0.25 OR 0.5 MG/DOSE) 2 MG/1.5ML ~~LOC~~ SOPN: 0.5000 mg | PEN_INJECTOR | SUBCUTANEOUS | 6 refills | Status: DC

## 2021-11-24 MED ORDER — OZEMPIC (0.25 OR 0.5 MG/DOSE) 2 MG/1.5ML ~~LOC~~ SOPN: 0.2500 mg | PEN_INJECTOR | SUBCUTANEOUS | 0 refills | Status: DC

## 2021-11-24 MED ORDER — AMLODIPINE BESYLATE 10 MG PO TABS: 10.0000 mg | ORAL_TABLET | Freq: Every day | ORAL | 1 refills | Status: DC

## 2021-11-24 MED ORDER — METFORMIN HCL 500 MG PO TABS: 1000.0000 mg | ORAL_TABLET | Freq: Two times a day (BID) | ORAL | 1 refills | Status: DC

## 2021-11-24 MED ORDER — CARVEDILOL 25 MG PO TABS: 25.0000 mg | ORAL_TABLET | Freq: Two times a day (BID) | ORAL | 1 refills | Status: DC

## 2021-11-24 MED ORDER — HYDRALAZINE HCL 50 MG PO TABS: 50.0000 mg | ORAL_TABLET | Freq: Three times a day (TID) | ORAL | 1 refills | Status: DC

## 2021-11-24 MED ORDER — INSULIN DETEMIR 100 UNIT/ML ~~LOC~~ SOLN: 25.0000 [IU] | Freq: Every day | SUBCUTANEOUS | 11 refills | Status: DC

## 2021-11-24 MED ORDER — ATORVASTATIN CALCIUM 40 MG PO TABS: 40.0000 mg | ORAL_TABLET | Freq: Every day | ORAL | 1 refills | Status: DC

## 2021-11-24 NOTE — Progress Notes (Addendum)
? ?Subjective:  ?Patient ID: William Moss, male    DOB: 04/09/1986  Age: 36 y.o. MRN: [id number] ? ?CC: Diabetes ? ? ?HPI ?William Moss is a 36 y.o. year old male with a history of hypertension, type 2 diabetes mellitus (A1c 7.6) , morbid obesity, GERD hospitalized for COVID-19 pneumonia in 06/2019, pulmonary embolism in 05/2021 s/p TPAhere for chronic disease management. ? ?In 08/2021 he had an ED visit for left renal calculi. Per notes he was seen by urology and discharged in hopes of passing the stone spontaneously.  Labs at that time had revealed creatinine of 2.20 up from 1.34 at his last office visit. ? ?Interval History: ?With regards to his renal calculi he is currently asymptomatic. ?Fasting sugars are around 130-140.  Denies presence of hypoglycemic symptoms, numbness in extremities or visual concerns. ?He has not had Levemir or NovoLog for a while (since October) . He had also changed jobs and could not afford all his medications as he had no insurance ?Has also been out of Atorvastatin. ? ?BP is elevated and he is yet to take his antihypertensives as he rushed to today's visit. ?Seen by pulmonary 3 weeks ago for follow-up of his PE and notes reviewed, lifelong anticoagulation recommended.  He had right ventricular changes on TEE and VQ scan revealed presence of chronic PE  He was referred for sleep study and also to [facility] pulmonary for further evaluation. ?Lung perfusion scan 10/27/2021: ?IMPRESSION: ?Two small to moderate size peripheral perfusion defects most ?consistent with chronic pulmonary embolism. No large defects ?identified. ? ?His INR is therapeutic today at 2.4. ?Past Medical History:  ?Diagnosis Date  ? Atypical chest pain 05/27/2016  ? a. 05/2016: NST showing small area of moderare anteroapical ischemia b. 05/2016: cath showing tortous but normal cors which confirmed a false positive NST.  ? Diabetes mellitus without complication ([place])   ? GERD (gastroesophageal reflux disease) 10/02/2016  ?  Hypertension   ? Pulmonary embolism, bilateral ([place])   ? ? ?Past Surgical History:  ?Procedure Laterality Date  ? CARDIAC CATHETERIZATION N/A 06/10/2016  ? Procedure: Left Heart Cath and Coronary Angiography;  Surgeon: Wellington Hampshire, MD;  Location: [place] [facility];  Service: Cardiovascular;  Laterality: N/A;  ? COLONOSCOPY N/A 02/04/2016  ? Procedure: COLONOSCOPY;  Surgeon: Irene Shipper, MD;  Location: [facility];  Service: Endoscopy;  Laterality: N/A;  ? NO PAST SURGERIES    ? ? ?Family History  ?Problem Relation Age of Onset  ? Diabetes Mother   ? Diabetes Father   ? Pulmonary embolism Father   ? Cancer Maternal Grandmother   ? Alzheimer's disease Paternal Grandmother   ? Breast cancer Cousin   ? ? ?Social History  ? ?Socioeconomic History  ? Marital status: Single  ?  Spouse name: Not on file  ? Number of children: 0  ? Years of education: Not on file  ? Highest education level: Not on file  ?Occupational History  ? Occupation: call center rep  ?Tobacco Use  ? Smoking status: Never  ? Smokeless tobacco: Never  ?Substance and Sexual Activity  ? Alcohol use: No  ? Drug use: No  ? Sexual activity: Not Currently  ?Other Topics Concern  ? Not on file  ?Social History Narrative  ? Not on file  ? ?Social Determinants of Health  ? ?Financial Resource Strain: Not on file  ?Food Insecurity: Not on file  ?Transportation Needs: Not on file  ?Physical Activity: Not on file  ?  Stress: Not on file  ?Social Connections: Not on file  ? ? ?Allergies  ?Allergen Reactions  ? Lisinopril-Hydrochlorothiazide Other (See Comments)  ?  "Interfered with the pH level in the serum"  ? ? ?Outpatient Medications Prior to Visit  ?Medication Sig Dispense Refill  ? amLODipine (NORVASC) 10 MG tablet Take 1 tablet (10 mg total) by mouth daily. 90 tablet 1  ? atorvastatin (LIPITOR) 40 MG tablet Take 1 tablet (40 mg total) by mouth daily. 90 tablet 1  ? Blood Glucose Monitoring Suppl (ONETOUCH VERIO REFLECT) w/Device KIT Check blood sugar TID  E11.69 1 kit 0  ? carvedilol (COREG) 25 MG tablet Take 1 tablet (25 mg total) by mouth 2 (two) times daily. 180 tablet 1  ? HYDROcodone-acetaminophen (NORCO) 5-325 MG tablet Take 1 tablet by mouth every 6 (six) hours as needed for moderate pain. 15 tablet 0  ? insulin aspart (NOVOLOG) 100 UNIT/ML injection Inject 10 Units into the skin 3 (three) times daily after meals. 10 mL 11  ? insulin detemir (LEVEMIR) 100 UNIT/ML injection Inject 0.25 mLs (25 Units total) into the skin daily. 10 mL 11  ? Insulin Pen Needle (BD PEN NEEDLE NANO U/F) 32G X 4 MM MISC 10 Units by Does not apply route daily. 100 each 3  ? Lancets (ONETOUCH DELICA PLUS [id number]) MISC CHECK BLOOD SUGAR 3 TIMES A DAY 100 each 2  ? metFORMIN (GLUCOPHAGE) 500 MG tablet Take 2 tablets (1,000 mg total) by mouth 2 (two) times daily with a meal. 360 tablet 0  ? ondansetron (ZOFRAN) 4 MG tablet Take 1 tablet (4 mg total) by mouth every 8 (eight) hours as needed for nausea or vomiting. 4 tablet 0  ? warfarin (COUMADIN) 10 MG tablet Take 1 tablet every day by mouth. 90 tablet 1  ? hydrALAZINE (APRESOLINE) 50 MG tablet Take 1 tablet (50 mg total) by mouth every 8 (eight) hours. 90 tablet 1  ? ?No facility-administered medications prior to visit.  ? ? ? ?ROS ?Review of Systems  ?Constitutional:  Negative for activity change and appetite change.  ?HENT:  Negative for sinus pressure and sore throat.   ?Eyes:  Negative for visual disturbance.  ?Respiratory:  Positive for shortness of breath. Negative for cough and chest tightness.   ?Cardiovascular:  Negative for chest pain and leg swelling.  ?Gastrointestinal:  Negative for abdominal distention, abdominal pain, constipation and diarrhea.  ?Endocrine: Negative.   ?Genitourinary:  Negative for dysuria.  ?Musculoskeletal:  Negative for joint swelling and myalgias.  ?Skin:  Negative for rash.  ?Allergic/Immunologic: Negative.   ?Neurological:  Negative for weakness, light-headedness and numbness.   ?Psychiatric/Behavioral:  Negative for dysphoric mood and suicidal ideas.   ? ?Objective:  ?BP (!) 157/94   Pulse 84   Ht 5' 9"  (1.753 m)   Wt (!) 390 lb 6.4 oz (177.1 kg)   SpO2 95%   BMI 57.65 kg/m?  ? ? ?  11/24/2021  ?  8:33 AM 11/07/2021  ?  3:34 PM 10/15/2021  ?  8:59 AM  ?BP/Weight  ?Systolic BP 093 235 573  ?Diastolic BP 94 70 72  ?Wt. (Lbs) 390.4 390 382.4  ?BMI 57.65 kg/m2 57.59 kg/m2 56.47 kg/m2  ? ? ? ? ?Physical Exam ?Constitutional:   ?   Appearance: He is well-developed. He is obese.  ?Cardiovascular:  ?   Rate and Rhythm: Normal rate.  ?   Heart sounds: Normal heart sounds. No murmur heard. ?Pulmonary:  ?   Effort: Pulmonary  effort is normal.  ?   Breath sounds: Normal breath sounds. No wheezing or rales.  ?Chest:  ?   Chest wall: No tenderness.  ?Abdominal:  ?   General: Bowel sounds are normal. There is no distension.  ?   Palpations: Abdomen is soft. There is no mass.  ?   Tenderness: There is no abdominal tenderness.  ?Musculoskeletal:     ?   General: Normal range of motion.  ?   Right lower leg: No edema.  ?   Left lower leg: No edema.  ?Neurological:  ?   Mental Status: He is alert and oriented to person, place, and time.  ?Psychiatric:     ?   Mood and Affect: Mood normal.  ? ? ? ?  Latest Ref Rng & Units 09/21/2021  ? 10:15 PM 07/25/2021  ? 12:02 PM 07/03/2021  ?  4:06 AM  ?CMP  ?Glucose 70 - 99 mg/dL 118   124   194    ?BUN 6 - 20 mg/dL 32   20   28    ?Creatinine 0.61 - 1.24 mg/dL 2.20   1.34   1.39    ?Sodium 135 - 145 mmol/L 139   139   132    ?Potassium 3.5 - 5.1 mmol/L 4.1   4.3   4.4    ?Chloride 98 - 111 mmol/L 109   113   104    ?CO2 22 - 32 mmol/L 22   16   21     ?Calcium 8.9 - 10.3 mg/dL 9.2   8.8   8.9    ?Total Protein 6.5 - 8.1 g/dL  7.2     ?Total Bilirubin 0.3 - 1.2 mg/dL  0.3     ?Alkaline Phos 38 - 126 U/L  93     ?AST 15 - 41 U/L  15     ?ALT 0 - 44 U/L  30     ? ? ?Lipid Panel  ?   ?Component Value Date/Time  ? CHOL 133 04/17/2021 1011  ? TRIG 118 04/17/2021 1011  ? HDL  43 04/17/2021 1011  ? CHOLHDL 3.1 04/17/2021 1011  ? CHOLHDL 3.5 06/29/2019 0005  ? VLDL 17 06/29/2019 0005  ? LDLCALC 69 04/17/2021 1011  ? ? ?CBC ?   ?Component Value Date/Time  ? WBC 12.0 (H) 09/21/2021 1958  ? RBC 5.46 01/2

## 2021-11-24 NOTE — Patient Instructions (Signed)
Semaglutide Injection ?What is this medication? ?SEMAGLUTIDE (SEM a GLOO tide) treats type 2 diabetes. It works by increasing insulin levels in your body, which decreases your blood sugar (glucose). It also reduces the amount of sugar released into the blood and slows down your digestion. It can also be used to lower the risk of heart attack and stroke in people with type 2 diabetes. Changes to diet and exercise are often combined with this medication. ?This medicine may be used for other purposes; ask your health care provider or pharmacist if you have questions. ?COMMON BRAND NAME(S): OZEMPIC ?What should I tell my care team before I take this medication? ?They need to know if you have any of these conditions: ?Endocrine tumors (MEN 2) or if someone in your family had these tumors ?Eye disease, vision problems ?History of pancreatitis ?Kidney disease ?Stomach problems ?Thyroid cancer or if someone in your family had thyroid cancer ?An unusual or allergic reaction to semaglutide, other medications, foods, dyes, or preservatives ?Pregnant or trying to get pregnant ?Breast-feeding ?How should I use this medication? ?This medication is for injection under the skin of your upper leg (thigh), stomach area, or upper arm. It is given once every week (every 7 days). You will be taught how to prepare and give this medication. Use exactly as directed. Take your medication at regular intervals. Do not take it more often than directed. ?If you use this medication with insulin, you should inject this medication and the insulin separately. Do not mix them together. Do not give the injections right next to each other. Change (rotate) injection sites with each injection. ?It is important that you put your used needles and syringes in a special sharps container. Do not put them in a trash can. If you do not have a sharps container, call your pharmacist or care team to get one. ?A special MedGuide will be given to you by the  pharmacist with each prescription and refill. Be sure to read this information carefully each time. ?This medication comes with INSTRUCTIONS FOR USE. Ask your pharmacist for directions on how to use this medication. Read the information carefully. Talk to your pharmacist or care team if you have questions. ?Talk to your care team about the use of this medication in children. Special care may be needed. ?Overdosage: If you think you have taken too much of this medicine contact a poison control center or emergency room at once. ?NOTE: This medicine is only for you. Do not share this medicine with others. ?What if I miss a dose? ?If you miss a dose, take it as soon as you can within 5 days after the missed dose. Then take your next dose at your regular weekly time. If it has been longer than 5 days after the missed dose, do not take the missed dose. Take the next dose at your regular time. Do not take double or extra doses. If you have questions about a missed dose, contact your care team for advice. ?What may interact with this medication? ?Other medications for diabetes ?Many medications may cause changes in blood sugar, these include: ?Alcohol containing beverages ?Antiviral medications for HIV or AIDS ?Aspirin and aspirin-like medications ?Certain medications for blood pressure, heart disease, irregular heart beat ?Chromium ?Diuretics ?Male hormones, such as estrogens or progestins, birth control pills ?Fenofibrate ?Gemfibrozil ?Isoniazid ?Lanreotide ?Male hormones or anabolic steroids ?MAOIs like Carbex, Eldepryl, Marplan, Nardil, and Parnate ?Medications for weight loss ?Medications for allergies, asthma, cold, or cough ?Medications for depression,   anxiety, or psychotic disturbances ?Niacin ?Nicotine ?NSAIDs, medications for pain and inflammation, like ibuprofen or naproxen ?Octreotide ?Pasireotide ?Pentamidine ?Phenytoin ?Probenecid ?Quinolone antibiotics such as ciprofloxacin, levofloxacin, ofloxacin ?Some  herbal dietary supplements ?Steroid medications such as prednisone or cortisone ?Sulfamethoxazole; trimethoprim ?Thyroid hormones ?Some medications can hide the warning symptoms of low blood sugar (hypoglycemia). You may need to monitor your blood sugar more closely if you are taking one of these medications. These include: ?Beta-blockers, often used for high blood pressure or heart problems (examples include atenolol, metoprolol, propranolol) ?Clonidine ?Guanethidine ?Reserpine ?This list may not describe all possible interactions. Give your health care provider a list of all the medicines, herbs, non-prescription drugs, or dietary supplements you use. Also tell them if you smoke, drink alcohol, or use illegal drugs. Some items may interact with your medicine. ?What should I watch for while using this medication? ?Visit your care team for regular checks on your progress. ?Drink plenty of fluids while taking this medication. Check with your care team if you get an attack of severe diarrhea, nausea, and vomiting. The loss of too much body fluid can make it dangerous for you to take this medication. ?A test called the HbA1C (A1C) will be monitored. This is a simple blood test. It measures your blood sugar control over the last 2 to 3 months. You will receive this test every 3 to 6 months. ?Learn how to check your blood sugar. Learn the symptoms of low and high blood sugar and how to manage them. ?Always carry a quick-source of sugar with you in case you have symptoms of low blood sugar. Examples include hard sugar candy or glucose tablets. Make sure others know that you can choke if you eat or drink when you develop serious symptoms of low blood sugar, such as seizures or unconsciousness. They must get medical help at once. ?Tell your care team if you have high blood sugar. You might need to change the dose of your medication. If you are sick or exercising more than usual, you might need to change the dose of your  medication. ?Do not skip meals. Ask your care team if you should avoid alcohol. Many nonprescription cough and cold products contain sugar or alcohol. These can affect blood sugar. ?Pens should never be shared. Even if the needle is changed, sharing may result in passing of viruses like hepatitis or HIV. ?Wear a medical ID bracelet or chain, and carry a card that describes your disease and details of your medication and dosage times. ?Do not become pregnant while taking this medication. Women should inform their care team if they wish to become pregnant or think they might be pregnant. There is a potential for serious side effects to an unborn child. Talk to your care team for more information. ?What side effects may I notice from receiving this medication? ?Side effects that you should report to your care team as soon as possible: ?Allergic reactions--skin rash, itching, hives, swelling of the face, lips, tongue, or throat ?Change in vision ?Dehydration--increased thirst, dry mouth, feeling faint or lightheaded, headache, dark yellow or brown urine ?Gallbladder problems--severe stomach pain, nausea, vomiting, fever ?Heart palpitations--rapid, pounding, or irregular heartbeat ?Kidney injury--decrease in the amount of urine, swelling of the ankles, hands, or feet ?Pancreatitis--severe stomach pain that spreads to your back or gets worse after eating or when touched, fever, nausea, vomiting ?Thyroid cancer--new mass or lump in the neck, pain or trouble swallowing, trouble breathing, hoarseness ?Side effects that usually do not require medical   attention (report to your care team if they continue or are bothersome): ?Diarrhea ?Loss of appetite ?Nausea ?Stomach pain ?Vomiting ?This list may not describe all possible side effects. Call your doctor for medical advice about side effects. You may report side effects to FDA at 1-800-FDA-1088. ?Where should I keep my medication? ?Keep out of the reach of children. ?Store  unopened pens in a refrigerator between 2 and 8 degrees C (36 and 46 degrees F). Do not freeze. Protect from light and heat. After you first use the pen, it can be stored for 56 days at room temperature between 15 and

## 2021-11-24 NOTE — Telephone Encounter (Signed)
MH please finish and sign your office note from 3/10 for this patient so we can send the referral out.  ? ?Thank you  ?

## 2021-11-25 LAB — LP+NON-HDL CHOLESTEROL
Cholesterol, Total: 194 mg/dL (ref 100–199)
HDL: 44 mg/dL (ref >39)
LDL Chol Calc (NIH): 110 mg/dL — ABNORMAL HIGH (ref 0–99)
Total Non-HDL-Chol (LDL+VLDL): 150 mg/dL — ABNORMAL HIGH (ref 0–129)
Triglycerides: 232 mg/dL — ABNORMAL HIGH (ref 0–149)
VLDL Cholesterol Cal: 40 mg/dL (ref 5–40)

## 2021-11-25 LAB — CMP14+EGFR
ALT: 20 IU/L (ref 0–44)
AST: 14 IU/L (ref 0–40)
Albumin/Globulin Ratio: 1.3 (ref 1.2–2.2)
Albumin: 4.1 g/dL (ref 4.0–5.0)
Alkaline Phosphatase: 67 IU/L (ref 44–121)
BUN/Creatinine Ratio: 13 (ref 9–20)
BUN: 20 mg/dL (ref 6–20)
Bilirubin Total: 0.2 mg/dL (ref 0.0–1.2)
CO2: 20 mmol/L (ref 20–29)
Calcium: 9.2 mg/dL (ref 8.7–10.2)
Chloride: 106 mmol/L (ref 96–106)
Creatinine, Ser: 1.52 mg/dL — ABNORMAL HIGH (ref 0.76–1.27)
Globulin, Total: 3.2 g/dL (ref 1.5–4.5)
Glucose: 145 mg/dL — ABNORMAL HIGH (ref 70–99)
Potassium: 4.8 mmol/L (ref 3.5–5.2)
Sodium: 140 mmol/L (ref 134–144)
Total Protein: 7.3 g/dL (ref 6.0–8.5)
eGFR: 60 mL/min/{1.73_m2} (ref >59)

## 2021-11-25 LAB — MICROALBUMIN / CREATININE URINE RATIO
Creatinine, Urine: 190.6 mg/dL
Microalb/Creat Ratio: 15 mg/g creat (ref 0–29)
Microalbumin, Urine: 28.2 ug/mL

## 2021-11-25 NOTE — Progress Notes (Signed)
? ?'@Patient'$  ID: William Moss, male    DOB: [birth year], 36 y.o.   MRN: [id number] ? ?Chief Complaint  ?Patient presents with  ? Follow-up  ?  Follow up regarding her scan. Pt states he has been doing good since last visit with no issues noted   ? ? ?Referring provider: ?Charlott Rakes, MD ? ?HPI:  ? ?36 y.o. man with submassive PE s/p TPA presents to clinic for follow up of PE. ? ?Overall, he is doing okay.  Dyspneic but stable.  No major worsening, slight improvement overall.  Discussed at length results of recent perfusion scan.  This did not indicate signs of ongoing residual blood clot.  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  Discussed pathophysiology of this in regards to his recent echocardiogram.  Discussed at length that likely the changes on echocardiogram are also multifactorial.  Given his young age and these findings, discussed my desire to be aggressive with therapy and the role and rationale for referral to [facility] for consideration of CTEPH treatment. ? ?HPI at initial visit: ?Patient presented with long hospital October 2022.  Chest pain, shortness of breath.  CTA PE protocol revealed significant clot burden as well as bilateral left greater than right infiltrates on my review interpretation.  Evidence of heart strain on CT scan.  History of anticoagulation.  Despite this, worsening symptoms and hypoxemia that was refractory and severe.  Echocardiogram was performed but RV could not be visualized on my review.  Given worsening clinical status, systemic tPA was administered.  He had rapid improvement in symptoms.  She returns to clinic today for follow-up.  He is on warfarin which was chosen given his morbid obesity and concern for unreliable anticoagulation with factor Xa antagonist.  He is doing well.  Dyspnea gradually improved.  Still has some residual dyspnea on exertion.  Mainly with longer walks or  inclines or stairs. ? ? ?Questionaires / Pulmonary Flowsheets:  ? ?ACT:  ?   ? View : No data to display.  ?  ?  ?  ? ? ?MMRC: ?mMRC Dyspnea Scale mMRC Score  ?08/13/2021 ? 1:52 PM 3  ? ? ?Epworth:  ?   ? View : No data to display.  ?  ?  ?  ? ? ?Tests:  ? ?FENO:  ?No results found for: NITRICOXIDE ? ?PFT: ?   ? View : No data to display.  ?  ?  ?  ? ? ?WALK:  ?   ? View : No data to display.  ?  ?  ?  ? ? ?Imaging: ?Personally reviewed and as per EMR ?DG Chest 2 View ? ?Result Date: 10/28/2021 ?CLINICAL DATA:  History of shortness of breath. Chest x-ray prior to V/Q scan. EXAM: CHEST - 2 VIEW COMPARISON:  Chest x-ray 06/24/2021. FINDINGS: Interval removal of right PICC line. Cardiomegaly. No pulmonary venous congestion. Low lung volumes. Mild bibasilar atelectasis. No pleural effusion or pneumothorax. No acute bony abnormality. IMPRESSION: 1.  Cardiomegaly, no pulmonary venous congestion. 2.  Low lung volumes with bibasilar atelectasis. Electronically Signed   By: Marcello Moores  Register M.D.   On: 10/28/2021 06:54  ? ?NM Pulmonary Perfusion ? ?Result Date: 10/27/2021 ?CLINICAL DATA:  Acute pulmonary embolism identified on 06/23/2021 by CTA chest. Evaluate for chronic pulmonary emboli. Persistent but improving short of breath EXAM: NUCLEAR MEDICINE PERFUSION LUNG SCAN TECHNIQUE: Perfusion images were obtained in multiple projections after intravenous injection of radiopharmaceutical.  RADIOPHARMACEUTICALS:  4.25 mCi Tc-42mMAA COMPARISON:  CTA  chest 06/24/2019 FINDINGS: Peripheral segmental perfusion defect within the superior segment of the RIGHT lower lobe. Smaller defect within the lateral LEFT upper lobe. Chest x-ray normal IMPRESSION: Two small to moderate size peripheral perfusion defects most consistent with chronic pulmonary embolism. No large defects identified. These results will be called to the ordering clinician or representative by the Radiologist Assistant, and communication documented in the PACS or [employer] Electronically Signed   By: SSuzy BouchardM.D.   On: 10/27/2021 13:15   ? ?Lab Results: ?Personally reviewed ?CBC ?   ?Component Value Date/Time  ? WBC 12.0 (H) 09/21/2021 1958  ? RBC 5.46 09/21/2021 1958  ? HGB 14.0 09/21/2021 1958  ? HGB 12.7 (L) 07/25/2021 1202  ? HCT 45.4 09/21/2021 1958  ? PLT 283 09/21/2021 1958  ? PLT 160 07/25/2021 1202  ? MCV 83.2 09/21/2021 1958  ? MCH 25.6 (L) 09/21/2021 1958  ? MCHC 30.8 09/21/2021 1958  ? RDW 14.8 09/21/2021 1958  ? LYMPHSABS 2.0 09/21/2021 1958  ? MONOABS 0.8 09/21/2021 1958  ? EOSABS 0.1 09/21/2021 1958  ? BASOSABS 0.0 09/21/2021 1958  ? ? ?BMET ?   ?Component Value Date/Time  ? NA 140 11/24/2021 0917  ? K 4.8 11/24/2021 0917  ? CL 106 11/24/2021 0917  ? CO2 20 11/24/2021 0917  ? GLUCOSE 145 (H) 11/24/2021 0917  ? GLUCOSE 118 (H) 09/21/2021 2215  ? BUN 20 11/24/2021 0917  ? CREATININE 1.52 (H) 11/24/2021 09892 ? CREATININE 1.34 (H) 07/25/2021 1202  ? CREATININE 0.92 08/28/2016 1000  ? CALCIUM 9.2 11/24/2021 0917  ? GFRNONAA 39 (L) 09/21/2021 2215  ? GFRNONAA >60 07/25/2021 1202  ? GFRNONAA >89 08/28/2016 1000  ? GFRAA 62 10/07/2020 1140  ? GFRAA >89 08/28/2016 1000  ? ? ?BNP ?   ?Component Value Date/Time  ? BNP 69.6 06/23/2021 0029  ? ? ?ProBNP ?No results found for: PROBNP ? ?Specialty Problems   ? ?  ? Pulmonary Problems  ? Sleep apnea  ? Acute respiratory failure with hypoxia ([place]  ? Pneumonia  ? ? ?Allergies  ?Allergen Reactions  ? Lisinopril-Hydrochlorothiazide Other (See Comments)  ?  "Interfered with the pH level in the serum"  ? ? ?Immunization History  ?Administered Date(s) Administered  ? Influenza,inj,Quad PF,6+ Mos 07/29/2015, 08/28/2016, 09/17/2017, 09/13/2018, 05/15/2019, 05/30/2020  ? Influenza-Unspecified 07/15/2021  ? PFIZER(Purple Top)SARS-COV-2 Vaccination 11/26/2019, 12/26/2019, 07/01/2020  ? PPension scheme manager154yr& up 08/21/2021  ? Pneumococcal Polysaccharide-23 08/28/2015  ? Tdap 05/15/2019  ? ? ?Past  Medical History:  ?Diagnosis Date  ? Atypical chest pain 05/27/2016  ? a. 05/2016: NST showing small area of moderare anteroapical ischemia b. 05/2016: cath showing tortous but normal cors which confirmed a false positive NST.  ? Diabetes mellitus without complication ([place]  ? GERD (gastroesophageal reflux disease) 10/02/2016  ? Hypertension   ? Pulmonary embolism, bilateral ([place]  ? ? ?Tobacco History: ?Social History  ? ?Tobacco Use  ?Smoking Status Never  ?Smokeless Tobacco Never  ? ?Counseling given: Not Answered ? ? ?Continue to not smoke ? ?Outpatient Encounter Medications as of 11/07/2021  ?Medication Sig  ? Blood Glucose Monitoring Suppl (ONETOUCH VERIO REFLECT) w/Device KIT Check blood sugar TID E11.69  ? HYDROcodone-acetaminophen (NORCO) 5-325 MG tablet Take 1 tablet by mouth every 6 (six) hours as needed for moderate pain.  ? Insulin Pen Needle (BD PEN NEEDLE NANO U/F) 32G X 4 MM  MISC 10 Units by Does not apply route daily.  ? Lancets (ONETOUCH DELICA PLUS [id number]) MISC CHECK BLOOD SUGAR 3 TIMES A DAY  ? ondansetron (ZOFRAN) 4 MG tablet Take 1 tablet (4 mg total) by mouth every 8 (eight) hours as needed for nausea or vomiting.  ? warfarin (COUMADIN) 10 MG tablet Take 1 tablet every day by mouth.  ? [DISCONTINUED] amLODipine (NORVASC) 10 MG tablet Take 1 tablet (10 mg total) by mouth daily.  ? [DISCONTINUED] atorvastatin (LIPITOR) 40 MG tablet Take 1 tablet (40 mg total) by mouth daily.  ? [DISCONTINUED] carvedilol (COREG) 25 MG tablet Take 1 tablet (25 mg total) by mouth 2 (two) times daily.  ? [DISCONTINUED] hydrALAZINE (APRESOLINE) 50 MG tablet Take 1 tablet (50 mg total) by mouth every 8 (eight) hours.  ? [DISCONTINUED] insulin aspart (NOVOLOG) 100 UNIT/ML injection Inject 10 Units into the skin 3 (three) times daily after meals.  ? [DISCONTINUED] insulin detemir (LEVEMIR) 100 UNIT/ML injection Inject 0.25 mLs (25 Units total) into the skin daily.  ? [DISCONTINUED] metFORMIN (GLUCOPHAGE) 500 MG  tablet Take 2 tablets (1,000 mg total) by mouth 2 (two) times daily with a meal.  ? [DISCONTINUED] lisinopril-hydrochlorothiazide (ZESTORETIC) 20-12.5 MG tablet Take 2 tablets by mouth daily.  ? ?No facility-

## 2021-11-25 NOTE — Telephone Encounter (Signed)
Requested Prescriptions  ?Pending Prescriptions Disp Refills  ?? hydrALAZINE (APRESOLINE) 50 MG tablet [Pharmacy Med Name: HYDRALAZINE 50 MG TABLET] 270 tablet 0  ?  Sig: TAKE 1 TABLET BY MOUTH EVERY 8 HOURS.  ?  ? Cardiovascular:  Vasodilators Failed - 11/24/2021 10:49 AM  ?  ?  Failed - WBC in normal range and within 360 days  ?  WBC  ?Date Value Ref Range Status  ?09/21/2021 12.0 (H) 4.0 - 10.5 K/uL Final  ?   ?  ?  Failed - ANA Screen, Ifa, Serum in normal range and within 360 days  ?  No results found for: ANA, ANATITER, LABANTI   ?  ?  Failed - Last BP in normal range  ?  BP Readings from Last 1 Encounters:  ?11/24/21 (!) 157/94  ?   ?  ?  Passed - HCT in normal range and within 360 days  ?  HCT  ?Date Value Ref Range Status  ?09/21/2021 45.4 39.0 - 52.0 % Final  ?   ?  ?  Passed - HGB in normal range and within 360 days  ?  Hemoglobin  ?Date Value Ref Range Status  ?09/21/2021 14.0 13.0 - 17.0 g/dL Final  ?70/35/0093 81.8 (L) 13.0 - 17.0 g/dL Final  ?   ?  ?  Passed - RBC in normal range and within 360 days  ?  RBC  ?Date Value Ref Range Status  ?09/21/2021 5.46 4.22 - 5.81 MIL/uL Final  ?   ?  ?  Passed - PLT in normal range and within 360 days  ?  Platelets  ?Date Value Ref Range Status  ?09/21/2021 283 150 - 400 K/uL Final  ? ?Platelet Count  ?Date Value Ref Range Status  ?07/25/2021 160 150 - 400 K/uL Final  ?   ?  ?  Passed - Valid encounter within last 12 months  ?  Recent Outpatient Visits   ?      ? Yesterday Diabetes mellitus type 2 in obese [facility])  ? [facility] [place], Odette Horns, MD  ? 4 months ago Acute respiratory failure with hypoxia (HCC)  ? [facility] [place], Odette Horns, MD  ? 7 months ago Morbid obesity [facility])  ? [facility] [place], Odette Horns, MD  ? 1 year ago Type 2 diabetes mellitus with other specified complication, with long-term current use of insulin (HCC)  ? [facility]  [place], Odette Horns, MD  ? 1 year ago Hypertension associated with diabetes [facility])  ? [facility] Lo[place] Huxley, Cornelius Moras, RPH-CPP  ?  ?  ?Future Appointments   ?        ? In 1 month Lois Huxley, Cornelius Moras, RPH-CPP [facility]  ? In 1 month Hunsucker, Lesia Sago, MD [place] [facility]  ? In 3 months Hoy Register, MD [facility]  ? In 3 months Hunsucker, Lesia Sago, MD  Pulmonary Care  ?  ? ?  ?  ?  ? ? ?

## 2021-11-25 NOTE — Telephone Encounter (Signed)
MH finished and signed the office note from 11/07/21 for this patient. Nothing further needed.  ?

## 2021-11-25 NOTE — Telephone Encounter (Signed)
Order faxed to Dr Monia Pouch office ?

## 2021-11-25 NOTE — Telephone Encounter (Signed)
Done

## 2021-12-03 ENCOUNTER — Ambulatory Visit: Payer: BC Managed Care – PPO | Admitting: Family Medicine

## 2021-12-09 NOTE — Telephone Encounter (Signed)
Since call to the office, pt has had a recent OV with Dr. Judeth Horn. Closing encounter. ?

## 2021-12-11 ENCOUNTER — Encounter: Payer: Self-pay | Admitting: Pulmonary Disease

## 2021-12-11 NOTE — Telephone Encounter (Signed)
Mychart message sent by pt: ?[facility] (supporting Hunsucker, Lesia Sago, MD) 24 minutes ago (4:21 PM)  ? ?Hi! It?s been around a month and I haven?t heard anything about the referral to [facility]. I was just checking in to see what was going on ? ? ? ?Looked at pt's last OV and see where an order for cardiology referral was placed but do not see where it was specified for pt to be referred to [facility] Cardiology. I also see that it shows the referral order has been closed. ? ? ?Dr. Judeth Horn, please advise on this. ?

## 2021-12-15 NOTE — Telephone Encounter (Signed)
Routing to [facility] for them to look into this for [place]. Please advise on the order that Dr. Judeth Horn placed at pt's last OV for pt to see [facility] Cardiology. ?

## 2021-12-16 NOTE — Telephone Encounter (Signed)
I called Cardiology Dept at [facility] & verified referral needs to be faxed to Pulmonary Dept - Dr Monia Pouch is in Cardiopulmonary.  I checked with the Pulmonary Dept & they did not have previous referral we sent over to them.  I have refaxed referral to [facility] Pulmonary today & will follow up and make sure they do have it.  Will route back to triage so they can let pt know thru MyChart that referral has been sent to [facility]monary & I will follow up with them & verify that it has been received. ?

## 2021-12-16 NOTE — Telephone Encounter (Signed)
I am working on this

## 2021-12-21 DIAGNOSIS — I2699 Other pulmonary embolism without acute cor pulmonale: Secondary | ICD-10-CM | POA: Diagnosis not present

## 2021-12-21 DIAGNOSIS — G4733 Obstructive sleep apnea (adult) (pediatric): Secondary | ICD-10-CM | POA: Diagnosis not present

## 2021-12-21 DIAGNOSIS — J189 Pneumonia, unspecified organism: Secondary | ICD-10-CM | POA: Diagnosis not present

## 2021-12-22 NOTE — Telephone Encounter (Signed)
I had a voice mail on my phone from [facility] stating that they needed me to fax the referral again to them at [phone] I have refaxed this referral ?

## 2021-12-25 ENCOUNTER — Other Ambulatory Visit: Payer: Self-pay | Admitting: Physician Assistant

## 2021-12-25 ENCOUNTER — Ambulatory Visit: Payer: BC Managed Care – PPO | Attending: Family Medicine | Admitting: Pharmacist

## 2021-12-25 DIAGNOSIS — I2609 Other pulmonary embolism with acute cor pulmonale: Secondary | ICD-10-CM

## 2021-12-25 DIAGNOSIS — I2699 Other pulmonary embolism without acute cor pulmonale: Secondary | ICD-10-CM | POA: Diagnosis not present

## 2021-12-25 LAB — POCT INR: INR: 2.3 (ref 2.0–3.0)

## 2021-12-26 ENCOUNTER — Inpatient Hospital Stay (HOSPITAL_BASED_OUTPATIENT_CLINIC_OR_DEPARTMENT_OTHER): Payer: BC Managed Care – PPO | Admitting: Physician Assistant

## 2021-12-26 ENCOUNTER — Inpatient Hospital Stay: Payer: BC Managed Care – PPO | Attending: Physician Assistant

## 2021-12-26 ENCOUNTER — Ambulatory Visit: Payer: BC Managed Care – PPO | Admitting: Physician Assistant

## 2021-12-26 ENCOUNTER — Other Ambulatory Visit: Payer: Self-pay

## 2021-12-26 VITALS — BP 180/105 | HR 90 | Temp 97.5°F | Resp 18 | Wt 395.9 lb

## 2021-12-26 DIAGNOSIS — E119 Type 2 diabetes mellitus without complications: Secondary | ICD-10-CM | POA: Diagnosis not present

## 2021-12-26 DIAGNOSIS — I82512 Chronic embolism and thrombosis of left femoral vein: Secondary | ICD-10-CM

## 2021-12-26 DIAGNOSIS — I2699 Other pulmonary embolism without acute cor pulmonale: Secondary | ICD-10-CM

## 2021-12-26 DIAGNOSIS — I82402 Acute embolism and thrombosis of unspecified deep veins of left lower extremity: Secondary | ICD-10-CM | POA: Insufficient documentation

## 2021-12-26 DIAGNOSIS — Z7901 Long term (current) use of anticoagulants: Secondary | ICD-10-CM | POA: Insufficient documentation

## 2021-12-26 DIAGNOSIS — I2609 Other pulmonary embolism with acute cor pulmonale: Secondary | ICD-10-CM

## 2021-12-26 DIAGNOSIS — Z86711 Personal history of pulmonary embolism: Secondary | ICD-10-CM | POA: Diagnosis not present

## 2021-12-26 DIAGNOSIS — I10 Essential (primary) hypertension: Secondary | ICD-10-CM | POA: Insufficient documentation

## 2021-12-26 DIAGNOSIS — R0609 Other forms of dyspnea: Secondary | ICD-10-CM | POA: Insufficient documentation

## 2021-12-26 LAB — CBC WITH DIFFERENTIAL (CANCER CENTER ONLY)
Abs Immature Granulocytes: 0.03 10*3/uL (ref 0.00–0.07)
Basophils Absolute: 0 10*3/uL (ref 0.0–0.1)
Basophils Relative: 0 %
Eosinophils Absolute: 0.2 10*3/uL (ref 0.0–0.5)
Eosinophils Relative: 3 %
HCT: 36.4 % — ABNORMAL LOW (ref 39.0–52.0)
Hemoglobin: 11.4 g/dL — ABNORMAL LOW (ref 13.0–17.0)
Immature Granulocytes: 0 %
Lymphocytes Relative: 27 %
Lymphs Abs: 2 10*3/uL (ref 0.7–4.0)
MCH: 25.3 pg — ABNORMAL LOW (ref 26.0–34.0)
MCHC: 31.3 g/dL (ref 30.0–36.0)
MCV: 80.9 fL (ref 80.0–100.0)
Monocytes Absolute: 0.7 10*3/uL (ref 0.1–1.0)
Monocytes Relative: 9 %
Neutro Abs: 4.3 10*3/uL (ref 1.7–7.7)
Neutrophils Relative %: 61 %
Platelet Count: 259 10*3/uL (ref 150–400)
RBC: 4.5 MIL/uL (ref 4.22–5.81)
RDW: 15.9 % — ABNORMAL HIGH (ref 11.5–15.5)
WBC Count: 7.2 10*3/uL (ref 4.0–10.5)
nRBC: 0 % (ref 0.0–0.2)

## 2021-12-26 LAB — CMP (CANCER CENTER ONLY)
ALT: 22 U/L (ref 0–44)
AST: 13 U/L — ABNORMAL LOW (ref 15–41)
Albumin: 4 g/dL (ref 3.5–5.0)
Alkaline Phosphatase: 53 U/L (ref 38–126)
Anion gap: 9 (ref 5–15)
BUN: 19 mg/dL (ref 6–20)
CO2: 22 mmol/L (ref 22–32)
Calcium: 8.9 mg/dL (ref 8.9–10.3)
Chloride: 108 mmol/L (ref 98–111)
Creatinine: 1.43 mg/dL — ABNORMAL HIGH (ref 0.61–1.24)
GFR, Estimated: >60 mL/min (ref >60)
Glucose, Bld: 111 mg/dL — ABNORMAL HIGH (ref 70–99)
Potassium: 3.9 mmol/L (ref 3.5–5.1)
Sodium: 139 mmol/L (ref 135–145)
Total Bilirubin: 0.3 mg/dL (ref 0.3–1.2)
Total Protein: 7.4 g/dL (ref 6.5–8.1)

## 2021-12-26 NOTE — Progress Notes (Signed)
?[place] ?[phone]   [phone] ? ?PROGRESS NOTE ? ?Patient Care Team: ?Charlott Rakes, MD as PCP - General (Family Medicine) ?Skeet Latch, MD as PCP - Cardiology (Cardiology) ? ? ?CHIEF COMPLAINTS/PURPOSE OF CONSULTATION:  ?History of bilateral pulmonary emboli and left lower extremity DVT ? ?HISTORY OF PRESENTING ILLNESS:  ?William Moss 36 y.o. male returns for a follow up for history of bilateral pulmonary emboli and left lower extremity DVT. He continues on anticoagulation therapy with coumadin.  ? ?[name] reports that his energy levels are fairly stable. He does continue to have dyspnea on exertion but it is slightly improved since the last visit. He is compliant with taking coumadin without any toxicities including signs of bleeding. He is trying to be mre active but admits that he is still sedentary. He denies any nausea, vomiting or abdominal pain. His bowel habits are unchanged without any recurrent episodes of diarrhea or constipation. He denies fevers, chills, night sweats, chest pain, cough, lower extremity edema, neuropathy or other skin changes.  He has no other complaints.  Rest of the 10 point ROS is below. ? ?MEDICAL HISTORY:  ?Past Medical History:  ?Diagnosis Date  ? Atypical chest pain 05/27/2016  ? a. 05/2016: NST showing small area of moderare anteroapical ischemia b. 05/2016: cath showing tortous but normal cors which confirmed a false positive NST.  ? Diabetes mellitus without complication ([place])   ? GERD (gastroesophageal reflux disease) 10/02/2016  ? Hypertension   ? Pulmonary embolism, bilateral ([place])   ? ? ?SURGICAL HISTORY: ?Past Surgical History:  ?Procedure Laterality Date  ? CARDIAC CATHETERIZATION N/A 06/10/2016  ? Procedure: Left Heart Cath and Coronary Angiography;  Surgeon: Wellington Hampshire, MD;  Location: [place] CV LAB;  Service: Cardiovascular;  Laterality: N/A;  ? COLONOSCOPY N/A 02/04/2016  ? Procedure: COLONOSCOPY;   Surgeon: Sumedha Munnerlyn Shipper, MD;  Location: [facility] ENDOSCOPY;  Service: Endoscopy;  Laterality: N/A;  ? NO PAST SURGERIES    ? ? ?SOCIAL HISTORY: ?Social History  ? ?Socioeconomic History  ? Marital status: Single  ?  Spouse name: Not on file  ? Number of children: 0  ? Years of education: Not on file  ? Highest education level: Not on file  ?Occupational History  ? Occupation: call center rep  ?Tobacco Use  ? Smoking status: Never  ? Smokeless tobacco: Never  ?Substance and Sexual Activity  ? Alcohol use: No  ? Drug use: No  ? Sexual activity: Not Currently  ?Other Topics Concern  ? Not on file  ?Social History Narrative  ? Not on file  ? ?Social Determinants of Health  ? ?Financial Resource Strain: Not on file  ?Food Insecurity: Not on file  ?Transportation Needs: Not on file  ?Physical Activity: Not on file  ?Stress: Not on file  ?Social Connections: Not on file  ?Intimate Partner Violence: Not on file  ? ? ?FAMILY HISTORY: ?Family History  ?Problem Relation Age of Onset  ? Diabetes Mother   ? Diabetes Father   ? Pulmonary embolism Father   ? Cancer Maternal Grandmother   ? Alzheimer's disease Paternal Grandmother   ? Breast cancer Cousin   ? ? ?ALLERGIES:  is allergic to lisinopril-hydrochlorothiazide. ? ?MEDICATIONS:  ?Current Outpatient Medications  ?Medication Sig Dispense Refill  ? amLODipine (NORVASC) 10 MG tablet Take 1 tablet (10 mg total) by mouth daily. 90 tablet 1  ? atorvastatin (LIPITOR) 40 MG tablet Take 1 tablet (40 mg total) by mouth  daily. 90 tablet 1  ? Blood Glucose Monitoring Suppl (ONETOUCH VERIO REFLECT) w/Device KIT Check blood sugar TID E11.69 1 kit 0  ? carvedilol (COREG) 25 MG tablet Take 1 tablet (25 mg total) by mouth 2 (two) times daily. 180 tablet 1  ? hydrALAZINE (APRESOLINE) 50 MG tablet TAKE 1 TABLET BY MOUTH EVERY 8 HOURS. 270 tablet 0  ? HYDROcodone-acetaminophen (NORCO) 5-325 MG tablet Take 1 tablet by mouth every 6 (six) hours as needed for moderate pain. 15 tablet 0  ? Insulin Pen  Needle (BD PEN NEEDLE NANO U/F) 32G X 4 MM MISC 10 Units by Does not apply route daily. 100 each 3  ? Lancets (ONETOUCH DELICA PLUS [id number]) MISC CHECK BLOOD SUGAR 3 TIMES A DAY 100 each 2  ? metFORMIN (GLUCOPHAGE) 500 MG tablet Take 2 tablets (1,000 mg total) by mouth 2 (two) times daily with a meal. 360 tablet 1  ? ondansetron (ZOFRAN) 4 MG tablet Take 1 tablet (4 mg total) by mouth every 8 (eight) hours as needed for nausea or vomiting. 4 tablet 0  ? Semaglutide,0.25 or 0.5MG /DOS, (OZEMPIC, 0.25 OR 0.5 MG/DOSE,) 2 MG/1.5ML SOPN Inject 0.25 mg into the skin once a week. For 4 weeks then increase to 0.5mg  2 mL 0  ? Semaglutide,0.25 or 0.5MG /DOS, (OZEMPIC, 0.25 OR 0.5 MG/DOSE,) 2 MG/1.5ML SOPN Inject 0.5 mg into the skin once a week. 2 mL 6  ? warfarin (COUMADIN) 10 MG tablet Take 1 tablet every day by mouth. 90 tablet 1  ? ?No current facility-administered medications for this visit.  ? ? ?REVIEW OF SYSTEMS:   ?Constitutional: ( - ) fevers, ( - )  chills , ( - ) night sweats ?Eyes: ( - ) blurriness of vision, ( - ) double vision, ( - ) watery eyes ?Ears, nose, mouth, throat, and face: ( - ) mucositis, ( - ) sore throat ?Respiratory: ( - ) cough, ( + ) dyspnea, ( - ) wheezes ?Cardiovascular: ( - ) palpitation, ( - ) chest discomfort, ( - ) lower extremity swelling ?Gastrointestinal:  ( - ) nausea, ( - ) heartburn, ( - ) change in bowel habits ?Skin: ( - ) abnormal skin rashes ?Lymphatics: ( - ) new lymphadenopathy, ( - ) easy bruising ?Neurological: ( - ) numbness, ( - ) tingling, ( - ) new weaknesses ?Behavioral/Psych: ( - ) mood change, ( - ) new changes  ?All other systems were reviewed with the patient and are negative. ? ?PHYSICAL EXAMINATION: ?ECOG PERFORMANCE STATUS: 1 - Symptomatic but completely ambulatory ? ?Vitals:  ? 12/26/21 1038  ?BP: (!) 180/105  ?Pulse: 90  ?Resp: 18  ?Temp: (!) 97.5 ?F (36.4 ?C)  ?SpO2: 95%  ? ?Filed Weights  ? 12/26/21 1038  ?Weight: (!) 395 lb 14.4 oz (179.6 kg)  ? ? ?GENERAL:  well appearing male in NAD, obese ?SKIN: skin color, texture, turgor are normal, no rashes or significant lesions ?EYES: conjunctiva are pink and non-injected, sclera clear ?LUNGS: clear to auscultation and percussion with normal breathing effort ?HEART: regular rate & rhythm and no murmurs and no lower extremity edema ?Musculoskeletal: no cyanosis of digits and no clubbing  ?PSYCH: alert & oriented x 3, fluent speech ?NEURO: no focal motor/sensory deficits ? ?LABORATORY DATA:  ?I have reviewed the data as listed ? ?  Latest Ref Rng & Units 12/26/2021  ?  9:51 AM 09/21/2021  ?  7:58 PM 07/25/2021  ? 12:02 PM  ?CBC  ?WBC 4.0 - 10.5 K/uL  7.2   12.0   6.8    ?Hemoglobin 13.0 - 17.0 g/dL 11.4   14.0   12.7    ?Hematocrit 39.0 - 52.0 % 36.4   45.4   39.7    ?Platelets 150 - 400 K/uL 259   283   160    ? ? ? ?  Latest Ref Rng & Units 12/26/2021  ?  9:51 AM 11/24/2021  ?  9:17 AM 09/21/2021  ? 10:15 PM  ?CMP  ?Glucose 70 - 99 mg/dL 111   145   118    ?BUN 6 - 20 mg/dL 19   20   32    ?Creatinine 0.61 - 1.24 mg/dL 1.43   1.52   2.20    ?Sodium 135 - 145 mmol/L 139   140   139    ?Potassium 3.5 - 5.1 mmol/L 3.9   4.8   4.1    ?Chloride 98 - 111 mmol/L 108   106   109    ?CO2 22 - 32 mmol/L _0 ?Calcium 8.9 - 10.3 mg/dL 8.9   9.2   9.2    ?Total Protein 6.5 - 8.1 g/dL 7.4   7.3     ?Total Bilirubin 0.3 - 1.2 mg/dL 0.3   0.2     ?Alkaline Phos 38 - 126 U/L 53   67     ?AST 15 - 41 U/L 13   14     ?ALT 0 - 44 U/L 22   20     ? ? ?ASSESSMENT & PLAN ?KOHLER PELLERITO is a 36 y.o. male who returns for a follow up for history of bilateral pulmonary emboli and left lower extremity DVT.  ? ?#Bilateral PE and left leg DVT: ?--Likely provoking factors include pneumonia and prolonged immobility due to sedentary lifestyle.  ?--Currently on coumadin, INR in therapeutic range.  ?--Hypercoagulable workup from November 2022 was unremarkable. ?--Patient continues to be sedentary and has evidence of residual pulmonary embolism seen on NM  perfusion scan from February 2023. He is scheduled for a consultation with [facility] for consideration of CTEPH therapies ?--Recommend to continue on anticoagulation in the setting of continued sedentary lifestyle,

## 2021-12-29 ENCOUNTER — Encounter: Payer: Self-pay | Admitting: Physician Assistant

## 2022-01-07 ENCOUNTER — Telehealth: Payer: Self-pay | Admitting: Student-PharmD

## 2022-01-07 NOTE — Telephone Encounter (Signed)
Patient appearing on report for [employer] - Hypertension Control report due to last documented ambulatory blood pressure of 180/105 on 12/26/21, however BP at last PCP visit 11/24/21 was 157/94 and patient had reported not taking BP medications yet that day. Next appointment with PCP is 02/24/22. He meets with the CPP at [facility] monthly for INR checks with next visit 01/27/22.  ? ?Outreached patient to discuss hypertension control and medication management. It appears he is adherent with his medications based on on-time fill history.  ? ?Current antihypertensives: amlodipine 10 mg daily, hydralazine 50 mg TID, carvedilol 25 mg BID ? ?Unable to reach patient, LVM requesting call back. Will add note to INR visit next month to see if CPP has time to check BP at that time and to remind patient at that time to take his medications before his PCP visit in June.  ? ?Pervis Hocking, PharmD ?PGY2 Ambulatory Care Pharmacy Resident ?01/07/2022 2:57 PM ?

## 2022-01-07 NOTE — Telephone Encounter (Signed)
Patient returned call. He notes that he has been taking his medications as prescribed and denies any questions or concerns.  ? ?He is amenable to a blood pressure check when he comes to see the embedded pharmacist for warfarin management. Reminded to take blood pressure medications prior to that visit. [place] previously placed reminder note in that appointment.  ? ?Catie Eppie Gibson, PharmD, BCACP ?[facility] ?[phone] ? ?

## 2022-01-09 ENCOUNTER — Encounter: Payer: Self-pay | Admitting: Family Medicine

## 2022-01-12 ENCOUNTER — Encounter: Payer: Self-pay | Admitting: Pulmonary Disease

## 2022-01-12 ENCOUNTER — Ambulatory Visit (INDEPENDENT_AMBULATORY_CARE_PROVIDER_SITE_OTHER): Payer: BC Managed Care – PPO | Admitting: Pulmonary Disease

## 2022-01-12 VITALS — BP 126/68 | HR 94 | Temp 98.6°F | Ht 69.0 in | Wt 392.0 lb

## 2022-01-12 DIAGNOSIS — G4730 Sleep apnea, unspecified: Secondary | ICD-10-CM

## 2022-01-12 DIAGNOSIS — I2699 Other pulmonary embolism without acute cor pulmonale: Secondary | ICD-10-CM

## 2022-01-12 NOTE — Patient Instructions (Signed)
Nice to see you again ? ?I am glad you have an appointment upcoming later this month at [facility] ? ?I will tentatively plan to see you back in August but if we need to push it back for extended based on what happened at [facility] please let me know.  I want to be a good point of contact to help with questions or concerns or new symptoms but I do not want to overburden you with unnecessary appointments. ? ?Return to clinic in 3 months or sooner if needed with Dr. Silas Flood ?

## 2022-01-12 NOTE — Progress Notes (Signed)
? ?_0  ID: William Moss, male    DOB: 09/20/1985, 36 y.o.   MRN: [id number] ? ?Chief Complaint  ?Patient presents with  ? Follow-up  ?  Pt is here for follow up. Pt still is on Coumadin 71m everyday. No issues noted from patient.   ? ? ?Referring provider: ?NCharlott Rakes MD ? ?HPI:  ? ?36y.o. man with submassive PE s/p TPA presents to clinic for follow up of PE. ? ?Overall, he is doing okay.  Symptoms may be a bit improved in terms of dyspnea.  Settling in at work.  Trying to walk most of the day, drinking more water.  [facility]  He continues on Coumadin, stable at 10 mg dose with most recent INR check within range x2.  Most recent hematology note reviewed.  Recently started on Ozempic for diabetes via PCP.  Most recent PCP note reviewed. ? ?HPI at initial visit: ?Patient presented with long hospital October 2022.  Chest pain, shortness of breath.  CTA PE protocol revealed significant clot burden as well as bilateral left greater than right infiltrates on my review interpretation.  Evidence of heart strain on CT scan.  History of anticoagulation.  Despite this, worsening symptoms and hypoxemia that was refractory and severe.  Echocardiogram was performed but RV could not be visualized on my review.  Given worsening clinical status, systemic tPA was administered.  He had rapid improvement in symptoms.  She returns to clinic today for follow-up.  He is on warfarin which was chosen given his morbid obesity and concern for unreliable anticoagulation with factor Xa antagonist.  He is doing well.  Dyspnea gradually improved.  Still has some residual dyspnea on exertion.  Mainly with longer walks or inclines or stairs. ? ? ?Questionaires / Pulmonary Flowsheets:  ? ?ACT:  ?   ? View : No data to display.  ?  ?  ?  ? ? ? ?MMRC: ?mMRC Dyspnea Scale mMRC Score  ?08/13/2021 ? 1:52 PM 3  ? ? ?Epworth:  ?   ? View : No data to display.  ?  ?  ?  ? ? ? ?Tests:  ? ?FENO:  ?No results  found for: NITRICOXIDE ? ?PFT: ?   ? View : No data to display.  ?  ?  ?  ? ? ? ?WALK:  ?   ? View : No data to display.  ?  ?  ?  ? ? ? ?Imaging: ?Personally reviewed and as per EMR ?No results found. ? ?Lab Results: ?Personally reviewed ?CBC ?   ?Component Value Date/Time  ? WBC 7.2 12/26/2021 0951  ? WBC 12.0 (H) 09/21/2021 1958  ? RBC 4.50 12/26/2021 0951  ? HGB 11.4 (L) 12/26/2021 0951  ? HCT 36.4 (L) 12/26/2021 0951  ? PLT 259 12/26/2021 0951  ? MCV 80.9 12/26/2021 0951  ? MCH 25.3 (L) 12/26/2021 0951  ? MCHC 31.3 12/26/2021 0951  ? RDW 15.9 (H) 12/26/2021 0951  ? LYMPHSABS 2.0 12/26/2021 0951  ? MONOABS 0.7 12/26/2021 0951  ? EOSABS 0.2 12/26/2021 0951  ? BASOSABS 0.0 12/26/2021 0951  ? ? ?BMET ?   ?Component Value Date/Time  ? NA 139 12/26/2021 0951  ? NA 140 11/24/2021 0917  ? K 3.9 12/26/2021 0951  ? CL 108 12/26/2021 0951  ? CO2 22 12/26/2021 0951  ? GLUCOSE 111 (H) 12/26/2021 0951  ? BUN 19 12/26/2021 0951  ? BUN 20 11/24/2021 0917  ? CREATININE 1.43 (  H) 12/26/2021 8295  ? CREATININE 0.92 08/28/2016 1000  ? CALCIUM 8.9 12/26/2021 0951  ? GFRNONAA >60 12/26/2021 0951  ? GFRNONAA >89 08/28/2016 1000  ? GFRAA 62 10/07/2020 1140  ? GFRAA >89 08/28/2016 1000  ? ? ?BNP ?   ?Component Value Date/Time  ? BNP 69.6 06/23/2021 0029  ? ? ?ProBNP ?No results found for: PROBNP ? ?Specialty Problems   ? ?  ? Pulmonary Problems  ? Sleep apnea  ? Acute respiratory failure with hypoxia ([place])  ? Pneumonia  ? ? ?Allergies  ?Allergen Reactions  ? Lisinopril-Hydrochlorothiazide Other (See Comments)  ?  "Interfered with the pH level in the serum"  ? ? ?Immunization History  ?Administered Date(s) Administered  ? Influenza,inj,Quad PF,6+ Mos 07/29/2015, 08/28/2016, 09/17/2017, 09/13/2018, 05/15/2019, 05/30/2020  ? Influenza-Unspecified 07/15/2021  ? PFIZER(Purple Top)SARS-COV-2 Vaccination 11/26/2019, 12/26/2019, 07/01/2020  ? Pension scheme manager 70yr & up 08/21/2021  ? Pneumococcal Polysaccharide-23  08/28/2015  ? Tdap 05/15/2019  ? ? ?Past Medical History:  ?Diagnosis Date  ? Atypical chest pain 05/27/2016  ? a. 05/2016: NST showing small area of moderare anteroapical ischemia b. 05/2016: cath showing tortous but normal cors which confirmed a false positive NST.  ? Diabetes mellitus without complication ([place]   ? GERD (gastroesophageal reflux disease) 10/02/2016  ? Hypertension   ? Pulmonary embolism, bilateral ([place]   ? ? ?Tobacco History: ?Social History  ? ?Tobacco Use  ?Smoking Status Never  ?Smokeless Tobacco Never  ? ?Counseling given: Not Answered ? ? ?Continue to not smoke ? ?Outpatient Encounter Medications as of 01/12/2022  ?Medication Sig  ? amLODipine (NORVASC) 10 MG tablet Take 1 tablet (10 mg total) by mouth daily.  ? atorvastatin (LIPITOR) 40 MG tablet Take 1 tablet (40 mg total) by mouth daily.  ? Blood Glucose Monitoring Suppl (ONETOUCH VERIO REFLECT) w/Device KIT Check blood sugar TID E11.69  ? carvedilol (COREG) 25 MG tablet Take 1 tablet (25 mg total) by mouth 2 (two) times daily.  ? hydrALAZINE (APRESOLINE) 50 MG tablet TAKE 1 TABLET BY MOUTH EVERY 8 HOURS.  ? HYDROcodone-acetaminophen (NORCO) 5-325 MG tablet Take 1 tablet by mouth every 6 (six) hours as needed for moderate pain.  ? Insulin Pen Needle (BD PEN NEEDLE NANO U/F) 32G X 4 MM MISC 10 Units by Does not apply route daily.  ? Lancets (ONETOUCH DELICA PLUS [id number] MISC CHECK BLOOD SUGAR 3 TIMES A DAY  ? metFORMIN (GLUCOPHAGE) 500 MG tablet Take 2 tablets (1,000 mg total) by mouth 2 (two) times daily with a meal.  ? ondansetron (ZOFRAN) 4 MG tablet Take 1 tablet (4 mg total) by mouth every 8 (eight) hours as needed for nausea or vomiting.  ? Semaglutide,0.25 or 0.5MG/DOS, (OZEMPIC, 0.25 OR 0.5 MG/DOSE,) 2 MG/1.5ML SOPN Inject 0.25 mg into the skin once a week. For 4 weeks then increase to 0.543m ? Semaglutide,0.25 or 0.5MG/DOS, (OZEMPIC, 0.25 OR 0.5 MG/DOSE,) 2 MG/1.5ML SOPN Inject 0.5 mg into the skin once a week.  ? warfarin  (COUMADIN) 10 MG tablet Take 1 tablet every day by mouth.  ? [DISCONTINUED] lisinopril-hydrochlorothiazide (ZESTORETIC) 20-12.5 MG tablet Take 2 tablets by mouth daily.  ? ?No facility-administered encounter medications on file as of 01/12/2022.  ? ? ? ?Review of Systems ? ?Review of Systems  ?No chest pain.  No orthopnea or PND.  No black or tarry stools.  No frank blood in stools.  No hemoptysis, no hematemesis.  Comprehensive review of systems otherwise negative. ?Physical Exam ? ?  BP 126/68 (BP Location: Left Arm, Patient Position: Sitting, Cuff Size: Normal)   Pulse 94   Temp 98.6 ?F (37 ?C) (Oral)   Ht _0  (1.753 m)   Wt (!) 392 lb (177.8 kg)   SpO2 97%   BMI 57.89 kg/m?  ? ?Wt Readings from Last 5 Encounters:  ?01/12/22 (!) 392 lb (177.8 kg)  ?12/26/21 (!) 395 lb 14.4 oz (179.6 kg)  ?11/24/21 (!) 390 lb 6.4 oz (177.1 kg)  ?11/07/21 (!) 390 lb (176.9 kg)  ?10/15/21 (!) 382 lb 6.4 oz (173.5 kg)  ? ? ?BMI Readings from Last 5 Encounters:  ?01/12/22 57.89 kg/m?  ?12/26/21 58.46 kg/m?  ?11/24/21 57.65 kg/m?  ?11/07/21 57.59 kg/m?  ?10/15/21 56.47 kg/m?  ? ? ? ?Physical Exam ?General: Obese, sitting in exam chair ?Eyes: EOMI, icterus ?Neck: Supple, no JVP ?Pulmonary: Clear, distant ?Cardiovascular: Regular rate and rhythm, no murmur appreciated ?Abdomen: Nondistended, bowel sounds present ?MSK: No synovitis, no joint effusion ?Neuro: Normal gait, no weakness patient ?Psych: Normal mood, full affect ? ? ?Assessment & Plan:  ? ?Submassive PE: (06/23/21) With severe refractory hypoxemia status post systemic tPA.  Symptoms improved over time.  Still residual dyspnea. Unsuccessful in determining RV function and size etc. during hospitalization.  TTE 08/2021 able to view RV and did demonstrate mild enlargement and decreased function, as well as left atrial dilation.  He is  to continue warfarin.  Recommend lifelong anticoagulation.  Given RV changes, VQ scan was ordered which did demonstrate ongoing residual filling  defects. Referral to [facility] as below. ? 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  Have ordered new supplies for OSA/CPAP ma

## 2022-01-20 DIAGNOSIS — G4733 Obstructive sleep apnea (adult) (pediatric): Secondary | ICD-10-CM | POA: Diagnosis not present

## 2022-01-20 DIAGNOSIS — I2699 Other pulmonary embolism without acute cor pulmonale: Secondary | ICD-10-CM | POA: Diagnosis not present

## 2022-01-20 DIAGNOSIS — J189 Pneumonia, unspecified organism: Secondary | ICD-10-CM | POA: Diagnosis not present

## 2022-01-27 ENCOUNTER — Ambulatory Visit: Payer: BC Managed Care – PPO | Attending: Family Medicine | Admitting: Pharmacist

## 2022-01-27 DIAGNOSIS — I82512 Chronic embolism and thrombosis of left femoral vein: Secondary | ICD-10-CM | POA: Diagnosis not present

## 2022-01-27 DIAGNOSIS — I2699 Other pulmonary embolism without acute cor pulmonale: Secondary | ICD-10-CM

## 2022-01-27 LAB — POCT INR: INR: 2.4 (ref 2.0–3.0)

## 2022-01-28 DIAGNOSIS — E119 Type 2 diabetes mellitus without complications: Secondary | ICD-10-CM | POA: Diagnosis not present

## 2022-01-28 DIAGNOSIS — Z6841 Body Mass Index (BMI) 40.0 and over, adult: Secondary | ICD-10-CM | POA: Diagnosis not present

## 2022-01-28 DIAGNOSIS — Z8616 Personal history of COVID-19: Secondary | ICD-10-CM | POA: Diagnosis not present

## 2022-01-28 DIAGNOSIS — I2782 Chronic pulmonary embolism: Secondary | ICD-10-CM | POA: Diagnosis not present

## 2022-01-28 DIAGNOSIS — G4733 Obstructive sleep apnea (adult) (pediatric): Secondary | ICD-10-CM | POA: Diagnosis not present

## 2022-01-28 DIAGNOSIS — Z114 Encounter for screening for human immunodeficiency virus [HIV]: Secondary | ICD-10-CM | POA: Diagnosis not present

## 2022-01-28 DIAGNOSIS — R0602 Shortness of breath: Secondary | ICD-10-CM | POA: Diagnosis not present

## 2022-01-28 DIAGNOSIS — Z7901 Long term (current) use of anticoagulants: Secondary | ICD-10-CM | POA: Diagnosis not present

## 2022-01-28 DIAGNOSIS — R931 Abnormal findings on diagnostic imaging of heart and coronary circulation: Secondary | ICD-10-CM | POA: Diagnosis not present

## 2022-01-28 DIAGNOSIS — I10 Essential (primary) hypertension: Secondary | ICD-10-CM | POA: Diagnosis not present

## 2022-01-28 DIAGNOSIS — Z79899 Other long term (current) drug therapy: Secondary | ICD-10-CM | POA: Diagnosis not present

## 2022-01-28 DIAGNOSIS — I2724 Chronic thromboembolic pulmonary hypertension: Secondary | ICD-10-CM | POA: Diagnosis not present

## 2022-01-28 DIAGNOSIS — R0609 Other forms of dyspnea: Secondary | ICD-10-CM | POA: Diagnosis not present

## 2022-01-28 DIAGNOSIS — Z86718 Personal history of other venous thrombosis and embolism: Secondary | ICD-10-CM | POA: Diagnosis not present

## 2022-02-02 DIAGNOSIS — G4733 Obstructive sleep apnea (adult) (pediatric): Secondary | ICD-10-CM | POA: Diagnosis not present

## 2022-02-03 ENCOUNTER — Encounter: Payer: Self-pay | Admitting: Pulmonary Disease

## 2022-02-08 ENCOUNTER — Other Ambulatory Visit: Payer: Self-pay | Admitting: Family Medicine

## 2022-02-09 ENCOUNTER — Other Ambulatory Visit: Payer: Self-pay | Admitting: Family Medicine

## 2022-02-09 MED ORDER — OZEMPIC (0.25 OR 0.5 MG/DOSE) 2 MG/1.5ML ~~LOC~~ SOPN
0.5000 mg | PEN_INJECTOR | SUBCUTANEOUS | 6 refills | Status: DC
Start: 2022-02-09 — End: 2022-05-18

## 2022-02-13 ENCOUNTER — Encounter: Payer: Self-pay | Admitting: Family Medicine

## 2022-02-19 ENCOUNTER — Ambulatory Visit: Payer: BC Managed Care – PPO | Admitting: Pharmacist

## 2022-02-20 ENCOUNTER — Telehealth: Payer: Self-pay | Admitting: Family Medicine

## 2022-02-20 DIAGNOSIS — G4733 Obstructive sleep apnea (adult) (pediatric): Secondary | ICD-10-CM | POA: Diagnosis not present

## 2022-02-20 DIAGNOSIS — I2699 Other pulmonary embolism without acute cor pulmonale: Secondary | ICD-10-CM | POA: Diagnosis not present

## 2022-02-20 DIAGNOSIS — J189 Pneumonia, unspecified organism: Secondary | ICD-10-CM | POA: Diagnosis not present

## 2022-02-22 ENCOUNTER — Other Ambulatory Visit: Payer: Self-pay | Admitting: Family Medicine

## 2022-02-22 DIAGNOSIS — E1159 Type 2 diabetes mellitus with other circulatory complications: Secondary | ICD-10-CM

## 2022-02-22 DIAGNOSIS — I152 Hypertension secondary to endocrine disorders: Secondary | ICD-10-CM

## 2022-02-24 ENCOUNTER — Ambulatory Visit: Payer: BC Managed Care – PPO | Admitting: Family Medicine

## 2022-02-27 DIAGNOSIS — I2724 Chronic thromboembolic pulmonary hypertension: Secondary | ICD-10-CM | POA: Diagnosis not present

## 2022-02-27 DIAGNOSIS — R0602 Shortness of breath: Secondary | ICD-10-CM | POA: Diagnosis not present

## 2022-02-27 DIAGNOSIS — I2782 Chronic pulmonary embolism: Secondary | ICD-10-CM | POA: Diagnosis not present

## 2022-02-27 DIAGNOSIS — K760 Fatty (change of) liver, not elsewhere classified: Secondary | ICD-10-CM | POA: Diagnosis not present

## 2022-03-02 ENCOUNTER — Ambulatory Visit: Payer: BC Managed Care – PPO | Admitting: Pharmacist

## 2022-03-04 DIAGNOSIS — G4733 Obstructive sleep apnea (adult) (pediatric): Secondary | ICD-10-CM | POA: Diagnosis not present

## 2022-03-12 ENCOUNTER — Ambulatory Visit: Payer: BC Managed Care – PPO | Admitting: Pulmonary Disease

## 2022-03-12 ENCOUNTER — Encounter: Payer: Self-pay | Admitting: Family Medicine

## 2022-03-12 ENCOUNTER — Other Ambulatory Visit: Payer: Self-pay | Admitting: Family Medicine

## 2022-03-22 DIAGNOSIS — J189 Pneumonia, unspecified organism: Secondary | ICD-10-CM | POA: Diagnosis not present

## 2022-03-22 DIAGNOSIS — I2699 Other pulmonary embolism without acute cor pulmonale: Secondary | ICD-10-CM | POA: Diagnosis not present

## 2022-03-22 DIAGNOSIS — G4733 Obstructive sleep apnea (adult) (pediatric): Secondary | ICD-10-CM | POA: Diagnosis not present

## 2022-03-25 ENCOUNTER — Telehealth: Payer: Self-pay | Admitting: Hematology and Oncology

## 2022-03-25 NOTE — Telephone Encounter (Signed)
Called patient regarding upcoming October appointments, left a voicemail.  

## 2022-04-04 DIAGNOSIS — G4733 Obstructive sleep apnea (adult) (pediatric): Secondary | ICD-10-CM | POA: Diagnosis not present

## 2022-04-10 ENCOUNTER — Encounter: Payer: Self-pay | Admitting: Pulmonary Disease

## 2022-04-10 ENCOUNTER — Ambulatory Visit (INDEPENDENT_AMBULATORY_CARE_PROVIDER_SITE_OTHER): Payer: BC Managed Care – PPO | Admitting: Pulmonary Disease

## 2022-04-10 ENCOUNTER — Encounter: Payer: Self-pay | Admitting: Hematology and Oncology

## 2022-04-10 VITALS — BP 132/74 | HR 88 | Temp 98.5°F | Ht 69.0 in | Wt 399.6 lb

## 2022-04-10 DIAGNOSIS — I2724 Chronic thromboembolic pulmonary hypertension: Secondary | ICD-10-CM

## 2022-04-10 DIAGNOSIS — I2699 Other pulmonary embolism without acute cor pulmonale: Secondary | ICD-10-CM

## 2022-04-10 NOTE — Progress Notes (Signed)
$'@Patient'l$  ID: William Moss, male    DOB: August 24, 1986, 36 y.o.   MRN: [id number]  Chief Complaint  Patient presents with   Follow-up    Pt is here for follow up for PE. Pt is still on Coumadin. Pt states no side effects noted thus far on medication. Pt also states he is feeling back to normal. CT scan done in June.     Referring provider: Charlott Rakes, MD  HPI:   36 y.o. man with submassive PE s/p TPA presents to clinic for follow up of PE. Cardiology note at [facility] reviewed. Recent CTEPH committee note from [place] reviewed.   Overall, he is doing okay.  Symptoms stable.  Has met with [facility].  They have been contacted plan for endarterectomy in a couple months.  Sounds like date has been set.  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  Discussed a bit about expectations regarding surgery etc.   HPI at initial visit: Patient presented with long hospital October 2022.  Chest pain, shortness of breath.  CTA PE protocol revealed significant clot burden as well as bilateral left greater than right infiltrates on my review interpretation.  Evidence of heart strain on CT scan.  History of anticoagulation.  Despite this, worsening symptoms and hypoxemia that was refractory and severe.  Echocardiogram was performed but RV could not be visualized on my review.  Given worsening clinical status, systemic tPA was administered.  He had rapid improvement in symptoms.  She returns to clinic today for follow-up.  He is on warfarin which was chosen given his morbid obesity and concern for unreliable anticoagulation with factor Xa antagonist.  He is doing well.  Dyspnea gradually improved.  Still has some residual dyspnea on exertion.  Mainly with longer walks or inclines or stairs.   Questionaires / Pulmonary Flowsheets:   ACT:      No data to display          MMRC: mMRC Dyspnea Scale mMRC Score  08/13/2021  1:52 PM 3    Epworth:      No data to  display          Tests:   FENO:  No results found for: "NITRICOXIDE"  PFT:     No data to display          WALK:      No data to display          Imaging: Personally reviewed and as per EMR No results found.  Lab Results: Personally reviewed CBC    Component Value Date/Time   WBC 7.2 12/26/2021 0951   WBC 12.0 (H) 09/21/2021 1958   RBC 4.50 12/26/2021 0951   HGB 11.4 (L) 12/26/2021 0951   HCT 36.4 (L) 12/26/2021 0951   PLT 259 12/26/2021 0951   MCV 80.9 12/26/2021 0951   MCH 25.3 (L) 12/26/2021 0951   MCHC 31.3 12/26/2021 0951   RDW 15.9 (H) 12/26/2021 0951   LYMPHSABS 2.0 12/26/2021 0951   MONOABS 0.7 12/26/2021 0951   EOSABS 0.2 12/26/2021 0951   BASOSABS 0.0 12/26/2021 0951    BMET    Component Value Date/Time   NA 139 12/26/2021 0951   NA 140 11/24/2021 0917   K 3.9 12/26/2021 0951   CL 108 12/26/2021 0951   CO2 22 12/26/2021 0951   GLUCOSE 111 (H) 12/26/2021 0951   BUN 19 12/26/2021 0951   BUN 20 11/24/2021 0917   CREATININE 1.43 (  H) 12/26/2021 0951   CREATININE 0.92 08/28/2016 1000   CALCIUM 8.9 12/26/2021 0951   GFRNONAA >60 12/26/2021 0951   GFRNONAA >89 08/28/2016 1000   GFRAA 62 10/07/2020 1140   GFRAA >89 08/28/2016 1000    BNP    Component Value Date/Time   BNP 69.6 06/23/2021 0029    ProBNP No results found for: "PROBNP"  Specialty Problems       Pulmonary Problems   Sleep apnea   Acute respiratory failure with hypoxia (HCC)   Pneumonia    Allergies  Allergen Reactions   Lisinopril-Hydrochlorothiazide Other (See Comments)    "Interfered with the pH level in the serum"    Immunization History  Administered Date(s) Administered   Influenza,inj,Quad PF,6+ Mos 07/29/2015, 08/28/2016, 09/17/2017, 09/13/2018, 05/15/2019, 05/30/2020   Influenza-Unspecified 07/15/2021   PFIZER(Purple Top)SARS-COV-2 Vaccination 11/26/2019, 12/26/2019, 07/01/2020   Pfizer Covid-19 Vaccine Bivalent Booster 32yr & up 08/21/2021    Pneumococcal Polysaccharide-23 08/28/2015   Tdap 05/15/2019    Past Medical History:  Diagnosis Date   Atypical chest pain 05/27/2016   a. 05/2016: NST showing small area of moderare anteroapical ischemia b. 05/2016: cath showing tortous but normal cors which confirmed a false positive NST.   Diabetes mellitus without complication ([place]    GERD (gastroesophageal reflux disease) 10/02/2016   Hypertension    Pulmonary embolism, bilateral (HCC)     Tobacco History: Social History   Tobacco Use  Smoking Status Never  Smokeless Tobacco Never   Counseling given: Not Answered   Continue to not smoke  Outpatient Encounter Medications as of 04/10/2022  Medication Sig   atorvastatin (LIPITOR) 40 MG tablet Take 1 tablet (40 mg total) by mouth daily.   Blood Glucose Monitoring Suppl (ONETOUCH VERIO REFLECT) w/Device KIT Check blood sugar TID E11.69   carvedilol (COREG) 25 MG tablet Take 1 tablet (25 mg total) by mouth 2 (two) times daily.   hydrALAZINE (APRESOLINE) 50 MG tablet TAKE 1 TABLET BY MOUTH EVERY 8 HOURS   HYDROcodone-acetaminophen (NORCO) 5-325 MG tablet Take 1 tablet by mouth every 6 (six) hours as needed for moderate pain.   Insulin Pen Needle (BD PEN NEEDLE NANO U/F) 32G X 4 MM MISC 10 Units by Does not apply route daily.   Lancets (ONETOUCH DELICA PLUS [id number] MISC CHECK BLOOD SUGAR 3 TIMES A DAY   metFORMIN (GLUCOPHAGE) 500 MG tablet Take 2 tablets (1,000 mg total) by mouth 2 (two) times daily with a meal.   ondansetron (ZOFRAN) 4 MG tablet Take 1 tablet (4 mg total) by mouth every 8 (eight) hours as needed for nausea or vomiting.   Semaglutide,0.25 or 0.5MG/DOS, (OZEMPIC, 0.25 OR 0.5 MG/DOSE,) 2 MG/1.5ML SOPN Inject 0.5 mg into the skin once a week.   warfarin (COUMADIN) 10 MG tablet Take 1 tablet every day by mouth.   amLODipine (NORVASC) 10 MG tablet Take 1 tablet (10 mg total) by mouth daily.   [DISCONTINUED] lisinopril-hydrochlorothiazide (ZESTORETIC) 20-12.5 MG  tablet Take 2 tablets by mouth daily.   No facility-administered encounter medications on file as of 04/10/2022.     Review of Systems  Review of Systems  No chest pain.  No orthopnea or PND.  No black or tarry stools.  No frank blood in stools.  No hemoptysis, no hematemesis.  Comprehensive review of systems otherwise negative. Physical Exam  BP 132/74 (BP Location: Left Arm, Patient Position: Sitting, Cuff Size: Normal)   Pulse 88   Temp 98.5 F (36.9 C) (Oral)   Ht 5'  9" (1.753 m)   Wt (!) 399 lb 9.6 oz (181.3 kg)   SpO2 95%   BMI 59.01 kg/m   Wt Readings from Last 5 Encounters:  04/10/22 (!) 399 lb 9.6 oz (181.3 kg)  01/12/22 (!) 392 lb (177.8 kg)  12/26/21 (!) 395 lb 14.4 oz (179.6 kg)  11/24/21 (!) 390 lb 6.4 oz (177.1 kg)  11/07/21 (!) 390 lb (176.9 kg)    BMI Readings from Last 5 Encounters:  04/10/22 59.01 kg/m  01/12/22 57.89 kg/m  12/26/21 58.46 kg/m  11/24/21 57.65 kg/m  11/07/21 57.59 kg/m     Physical Exam General: Obese, sitting in exam chair Eyes: EOMI, icterus Neck: Supple, no JVP Pulmonary: Clear, distant Cardiovascular: Regular rate and rhythm, no murmur appreciated Abdomen: Nondistended, bowel sounds present MSK: No synovitis, no joint effusion Neuro: Normal gait, no weakness patient Psych: Normal mood, full affect   Assessment & Plan:   Submassive PE: (06/23/21) With severe refractory hypoxemia status post systemic tPA.  Symptoms improved over time.  Still residual dyspnea. Unsuccessful in determining RV function and size etc. during hospitalization.  TTE 08/2021 able to view RV and did demonstrate mild enlargement and decreased function, as well as left atrial dilation.  He is  to continue warfarin.  Recommend lifelong anticoagulation.  Given RV changes, VQ scan was ordered which did demonstrate ongoing residual filling defects.  Undergoing [facility]up at [facility].  Continue indefinite anticoagulation.  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  Ongoing evaluation and workup to do with plan endarterectomy October 2023.    Return in about 6 months (around 10/11/2022).   Lanier Clam, MD 04/10/2022

## 2022-04-10 NOTE — Patient Instructions (Addendum)
Your DME is: [employer] (medical equipment and supplies)   I am glad that the plan is coming together at [facility]  I wish you the best of luck with the upcoming procedure and surgery.  Return to clinic in 6 months or sooner as needed with Dr. Judeth Horn, please let her know if I can help in any way in the interim.

## 2022-04-15 ENCOUNTER — Encounter: Payer: Self-pay | Admitting: Family Medicine

## 2022-04-21 ENCOUNTER — Other Ambulatory Visit: Payer: Self-pay | Admitting: Family Medicine

## 2022-04-22 DIAGNOSIS — J189 Pneumonia, unspecified organism: Secondary | ICD-10-CM | POA: Diagnosis not present

## 2022-04-22 DIAGNOSIS — G4733 Obstructive sleep apnea (adult) (pediatric): Secondary | ICD-10-CM | POA: Diagnosis not present

## 2022-04-22 DIAGNOSIS — I2699 Other pulmonary embolism without acute cor pulmonale: Secondary | ICD-10-CM | POA: Diagnosis not present

## 2022-05-17 ENCOUNTER — Other Ambulatory Visit: Payer: Self-pay | Admitting: Family Medicine

## 2022-05-18 ENCOUNTER — Telehealth (HOSPITAL_BASED_OUTPATIENT_CLINIC_OR_DEPARTMENT_OTHER): Payer: BC Managed Care – PPO | Admitting: Family Medicine

## 2022-05-18 ENCOUNTER — Ambulatory Visit: Payer: BC Managed Care – PPO | Admitting: Family Medicine

## 2022-05-18 ENCOUNTER — Encounter: Payer: Self-pay | Admitting: Family Medicine

## 2022-05-18 DIAGNOSIS — I2699 Other pulmonary embolism without acute cor pulmonale: Secondary | ICD-10-CM

## 2022-05-18 DIAGNOSIS — E1159 Type 2 diabetes mellitus with other circulatory complications: Secondary | ICD-10-CM

## 2022-05-18 DIAGNOSIS — I2724 Chronic thromboembolic pulmonary hypertension: Secondary | ICD-10-CM | POA: Diagnosis not present

## 2022-05-18 DIAGNOSIS — E1169 Type 2 diabetes mellitus with other specified complication: Secondary | ICD-10-CM | POA: Diagnosis not present

## 2022-05-18 DIAGNOSIS — I152 Hypertension secondary to endocrine disorders: Secondary | ICD-10-CM

## 2022-05-18 DIAGNOSIS — Z7985 Long-term (current) use of injectable non-insulin antidiabetic drugs: Secondary | ICD-10-CM

## 2022-05-18 DIAGNOSIS — E669 Obesity, unspecified: Secondary | ICD-10-CM

## 2022-05-18 DIAGNOSIS — Z7984 Long term (current) use of oral hypoglycemic drugs: Secondary | ICD-10-CM

## 2022-05-18 DIAGNOSIS — J069 Acute upper respiratory infection, unspecified: Secondary | ICD-10-CM | POA: Diagnosis not present

## 2022-05-18 MED ORDER — METFORMIN HCL 500 MG PO TABS: 1000.0000 mg | ORAL_TABLET | Freq: Two times a day (BID) | ORAL | 1 refills | Status: DC

## 2022-05-18 MED ORDER — SEMAGLUTIDE (2 MG/DOSE) 8 MG/3ML ~~LOC~~ SOPN: 2.0000 mg | PEN_INJECTOR | SUBCUTANEOUS | 6 refills | Status: DC

## 2022-05-18 MED ORDER — AMLODIPINE BESYLATE 10 MG PO TABS: 10.0000 mg | ORAL_TABLET | Freq: Every day | ORAL | 1 refills | Status: DC

## 2022-05-18 MED ORDER — SEMAGLUTIDE (1 MG/DOSE) 4 MG/3ML ~~LOC~~ SOPN: 1.0000 mg | PEN_INJECTOR | SUBCUTANEOUS | 0 refills | Status: DC

## 2022-05-18 MED ORDER — CARVEDILOL 25 MG PO TABS: 25.0000 mg | ORAL_TABLET | Freq: Two times a day (BID) | ORAL | 1 refills | Status: DC

## 2022-05-18 MED ORDER — HYDRALAZINE HCL 50 MG PO TABS: 50.0000 mg | ORAL_TABLET | Freq: Three times a day (TID) | ORAL | 1 refills | Status: DC

## 2022-05-18 NOTE — Progress Notes (Signed)
Virtual Visit via Video Note  I connected with William Moss, on 05/18/2022 at 8:47 AM by video enabled telemedicine device and verified that I am speaking with the correct person using two identifiers.   Consent: I discussed the limitations, risks, security and privacy concerns of performing an evaluation and management service by telemedicine and the availability of in person appointments. I also discussed with the patient that there may be a patient responsible charge related to this service. The patient expressed understanding and agreed to proceed.   Location of Patient: Home  Location of Provider: Clinic   Persons participating in Telemedicine visit: William Moss Dr. Margarita Rana     History of Present Illness: William Moss is a 36 y.o. 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    Fasting sugars are around 105 and he has no hypoglycemic episodes.  Reports doing well on metformin and Ozempic but he has not noticed much weight loss.  Denies presence of visual concerns, neuropathic symptoms and is not up-to-date on annual eye exam. Not checking BP at home but endorses adherence with his antihypertensive.  He is being worked up by [facility] for elective pulmonary endarterectomy with or without embolectomy with cardiopulmonary bypass in 05/2022. He remains on warfarin but has not been to the INR clinic in the last 4 months.  Last INR was 2.4 in 12/2021.  He currently has a cold which he states is starting to get better.  He has nasal congestion but no sinus pressure, no fever. Past Medical History:  Diagnosis Date   Atypical chest pain 05/27/2016   a. 05/2016: NST showing small area of moderare anteroapical ischemia b. 05/2016: cath showing tortous but  normal cors which confirmed a false positive NST.   Diabetes mellitus without complication ([place])    GERD (gastroesophageal reflux disease) 10/02/2016   Hypertension    Pulmonary embolism, bilateral (HCC)    Allergies  Allergen Reactions   Lisinopril-Hydrochlorothiazide Other (See Comments)    "Interfered with the pH level in the serum"    Current Outpatient Medications on File Prior to Visit  Medication Sig Dispense Refill   amLODipine (NORVASC) 10 MG tablet Take 1 tablet (10 mg total) by mouth daily. 90 tablet 1   atorvastatin (LIPITOR) 40 MG tablet Take 1 tablet (40 mg total) by mouth daily. 90 tablet 1   Blood Glucose Monitoring Suppl (ONETOUCH VERIO REFLECT) w/Device KIT Check blood sugar TID E11.69 1 kit 0   carvedilol (COREG) 25 MG tablet Take 1 tablet (25 mg total) by mouth 2 (two) times daily. 180 tablet 1   hydrALAZINE (APRESOLINE) 50 MG tablet TAKE 1 TABLET BY MOUTH EVERY 8 HOURS 270 tablet 0   HYDROcodone-acetaminophen (NORCO) 5-325 MG tablet Take 1 tablet by mouth every 6 (six) hours as needed for moderate pain. 15 tablet 0   Insulin Pen Needle (BD PEN NEEDLE NANO U/F) 32G X 4 MM MISC 10 Units by Does not apply route daily. 100 each 3   Lancets (ONETOUCH DELICA PLUS FKCLEX51Z) MISC CHECK BLOOD SUGAR 3 TIMES A DAY 100 each 2   metFORMIN (GLUCOPHAGE) 500 MG tablet Take 2 tablets (1,000 mg total) by mouth 2 (two) times daily with a meal. 360 tablet 1   ondansetron (ZOFRAN) 4 MG tablet Take 1 tablet (4  mg total) by mouth every 8 (eight) hours as needed for nausea or vomiting. 4 tablet 0   Semaglutide,0.25 or 0.5MG/DOS, (OZEMPIC, 0.25 OR 0.5 MG/DOSE,) 2 MG/1.5ML SOPN Inject 0.5 mg into the skin once a week. 2 mL 6   warfarin (COUMADIN) 10 MG tablet TAKE 1 TABLET BY MOUTH EVERY DAY 30 tablet 0   [DISCONTINUED] lisinopril-hydrochlorothiazide (ZESTORETIC) 20-12.5 MG tablet Take 2 tablets by mouth daily. 180 tablet 1   No current facility-administered medications on file prior to  visit.    ROS: See HPI  Observations/Objective: Awake, alert, oriented x3 Morbidly obese Neck with normal range of motion Respiration is normal Normal mood Extremities with full range of motion     Latest Ref Rng & Units 12/26/2021    9:51 AM 11/24/2021    9:17 AM 09/21/2021   10:15 PM  CMP  Glucose 70 - 99 mg/dL 111  145  118   BUN 6 - 20 mg/dL 19  20  32   Creatinine 0.61 - 1.24 mg/dL 1.43  1.52  2.20   Sodium 135 - 145 mmol/L 139  140  139   Potassium 3.5 - 5.1 mmol/L 3.9  4.8  4.1   Chloride 98 - 111 mmol/L 108  106  109   CO2 22 - 32 mmol/L _0 Calcium 8.9 - 10.3 mg/dL 8.9  9.2  9.2   Total Protein 6.5 - 8.1 g/dL 7.4  7.3    Total Bilirubin 0.3 - 1.2 mg/dL 0.3  0.2    Alkaline Phos 38 - 126 U/L 53  67    AST 15 - 41 U/L 13  14    ALT 0 - 44 U/L 22  20      Lipid Panel     Component Value Date/Time   CHOL 194 11/24/2021 0917   TRIG 232 (H) 11/24/2021 0917   HDL 44 11/24/2021 0917   CHOLHDL 3.1 04/17/2021 1011   CHOLHDL 3.5 06/29/2019 0005   VLDL 17 06/29/2019 0005   LDLCALC 110 (H) 11/24/2021 0917   LABVLDL 40 11/24/2021 0917    Lab Results  Component Value Date   HGBA1C 7.6 (A) 11/24/2021    Lab Results  Component Value Date   INR 2.4 01/27/2022   INR 2.3 12/25/2021   INR 2.4 11/24/2021     Assessment and Plan: 1. Hypertension associated with diabetes ([place]) Uncontrolled at last office visit Advised to obtain blood pressure cuff to keep log of blood pressures We will check BP at next in person office visit Continue antihypertensive Counseled on blood pressure goal of less than 130/80, low-sodium, DASH diet, medication compliance, 150 minutes of moderate intensity exercise per week. Discussed medication compliance, adverse effects. - CMP14+EGFR; Future - amLODipine (NORVASC) 10 MG tablet; Take 1 tablet (10 mg total) by mouth daily.  Dispense: 90 tablet; Refill: 1 - hydrALAZINE (APRESOLINE) 50 MG tablet; Take 1 tablet (50 mg total) by  mouth every 8 (eight) hours.  Dispense: 270 tablet; Refill: 1 - carvedilol (COREG) 25 MG tablet; Take 1 tablet (25 mg total) by mouth 2 (two) times daily.  Dispense: 180 tablet; Refill: 1  2. Diabetes mellitus type 2 in obese [facility]) Not fully optimized with A1c of 7.6 We will increase dose of Ozempic and we will titrate this higher to achieve maximal weight loss benefit.  If hypoglycemia recurs we will back down his dose of metformin Counseled on Diabetic diet, my plate method, 010 minutes of  moderate intensity exercise/week Blood sugar logs with fasting goals of 80-120 mg/dl, random of less than 180 and in the event of sugars less than 60 mg/dl or greater than 400 mg/dl encouraged to notify the clinic. Advised on the need for annual eye exams, annual foot exams, Pneumonia vaccine. - Hemoglobin A1c; Future - LP+Non-HDL Cholesterol; Future - metFORMIN (GLUCOPHAGE) 500 MG tablet; Take 2 tablets (1,000 mg total) by mouth 2 (two) times daily with a meal.  Dispense: 360 tablet; Refill: 1 - Ambulatory referral to Ophthalmology  3. Chronic thromboembolic pulmonary hypertension (HCC) Currently managed by pulmonary Has upcoming elective surgery next month  4. Viral upper respiratory tract infection Counseled on oral hydration, rest, nasal irrigation, use of antihistamine  5. Bilateral pulmonary embolism (HCC) Last INR was 2.4 in 12/2021 Counseled that while on warfarin he needs to have INR checks at least monthly We will have labs drawn tomorrow - Protime-INR; Future   Follow Up Instructions: Follow-up for labs tomorrow, follow-up appointment in 3 months   I discussed the assessment and treatment plan with the patient. The patient was provided an opportunity to ask questions and all were answered. The patient agreed with the plan and demonstrated an understanding of the instructions.   The patient was advised to call back or seek an in-person evaluation if the symptoms worsen or if the  condition fails to improve as anticipated.     I provided 21 minutes total of Telehealth time during this encounter including median intraservice time, reviewing previous notes, investigations, ordering medications, medical decision making, coordinating care and patient verbalized understanding at the end of the visit.     Charlott Rakes, MD, FAAFP. [facility] [place], [place]   05/18/2022, 8:47 AM

## 2022-05-18 NOTE — Patient Instructions (Signed)
How to Perform a Sinus Rinse A sinus rinse is a home treatment. It rinses your sinuses with a mixture of salt and water (saline solution). Sinuses are air-filled spaces in your skull behind the bones of your face and forehead. They open into your nasal cavity. A sinus rinse can help to clear your nasal cavity. It can clear mucus, dirt, dust, or pollen. You may do a sinus rinse when you have: A cold. A virus. Allergies. A sinus infection. A stuffy nose. What are the risks? A sinus rinse is normally very safe and helpful. However, there are a few risks. These include: A burning feeling in the sinuses. This may happen if you do not make the saline solution as told. Follow all directions. Nasal irritation. Infection from unclean water. This is rare, but it can happen. Do not do a sinus rinse if you have had: Ear or nasal surgery. An ear infection. Plugged ears. Supplies needed: Saline solution or powder. Distilled or germ-free (sterile) water may be needed to mix with saline powder. You may use boiled and cooled tap water. Boil tap water for 5 minutes. Cool the water until it is lukewarm. Use within 24 hours. Do not use regular tap water to mix with the saline solution. Neti pot or nasal rinse bottle. These release the saline solution into your nose and through your sinuses. You can buy neti pots and rinse bottles: At your local pharmacy. At a health food store. Online. How to do a sinus rinse  Wash your hands with soap and water for at least 20 seconds. If you cannot use soap and water, use hand sanitizer. Wash your device using the directions that came with it. Dry your device. Use the solution that comes with your device or one that is sold separately in stores. Follow the mixing directions on the package if you need to mix with germ-free or distilled water. Fill your device with the amount of saline solution stated in the device instructions. Stand by a sink and tilt your head  sideways over the sink. Place the spout of the device in your upper nostril (the one closer to the ceiling). Gently pour or squeeze the saline solution into your nasal cavity. The liquid should drain to your lower nostril if you are not too stuffed up (congested). While rinsing, breathe through your open mouth. Gently blow your nose to clear any mucus and rinse solution. Blowing too hard may cause ear pain. Turn your head in the other direction and repeat in your other nostril. Clean and rinse your device with clean water. Air-dry your device. Talk with your doctor or pharmacist if you have questions about how to do a sinus rinse. Summary A sinus rinse is a home treatment. It rinses your sinuses with a mixture of salt and water (saline solution). A sinus rinse can clear mucus, dirt, dust, or pollen. A sinus rinse is normally very safe and helpful. Follow all instructions carefully. This information is not intended to replace advice given to you by your health care provider. Make sure you discuss any questions you have with your health care provider. Document Revised: 02/03/2021 Document Reviewed: 02/03/2021 Elsevier Patient Education  2023 Elsevier Inc.  

## 2022-05-18 NOTE — Telephone Encounter (Signed)
Requested medication (s) are due for refill today: yes  Requested medication (s) are on the active medication list:yes  Last refill:  04/21/22  Future visit scheduled: yes  Notes to clinic:  Unable to refill per protocol, Manual Review: If patient's warfarin is managed by Anti-Coag team, route request to them. If not, route request to the provider.     Requested Prescriptions  Pending Prescriptions Disp Refills   warfarin (COUMADIN) 10 MG tablet [Pharmacy Med Name: WARFARIN SODIUM 10 MG TABLET] 30 tablet 0    Sig: TAKE 1 TABLET BY MOUTH EVERY DAY     Hematology:  Anticoagulants - warfarin Failed - 05/17/2022  9:30 AM      Failed - Manual Review: If patient's warfarin is managed by Anti-Coag team, route request to them. If not, route request to the provider.      Failed - INR in normal range and within 30 days    INR  Date Value Ref Range Status  01/27/2022 2.4 2.0 - 3.0 Final  09/21/2021 1.7 (H) 0.8 - 1.2 Final    Comment:    (NOTE) INR goal varies based on device and disease states. Performed at [facility], [place] [address], [place], [place] [address]          Failed - HCT in normal range and within 360 days    HCT  Date Value Ref Range Status  12/26/2021 36.4 (L) 39.0 - 52.0 % Final         Failed - Valid encounter within last 3 months    Recent Outpatient Visits           Today Hypertension associated with diabetes [facility])   [place], [place], MD   5 months ago Diabetes mellitus type 2 in obese [facility])   [place], Charlane Ferretti, MD   10 months ago Acute respiratory failure with hypoxia [facility])   [facility] Charlott Rakes, MD   1 year ago Morbid obesity [facility])   [place], Charlane Ferretti, MD   1 year ago Type 2 diabetes mellitus with other specified complication, with long-term current use of insulin [facility])   [place], [place], MD       Future Appointments             In 3 months Charlott Rakes, MD [place] - Patient is not pregnant

## 2022-05-19 ENCOUNTER — Ambulatory Visit: Payer: BC Managed Care – PPO | Attending: Family Medicine

## 2022-05-19 DIAGNOSIS — E1159 Type 2 diabetes mellitus with other circulatory complications: Secondary | ICD-10-CM | POA: Diagnosis not present

## 2022-05-19 DIAGNOSIS — I152 Hypertension secondary to endocrine disorders: Secondary | ICD-10-CM

## 2022-05-19 DIAGNOSIS — E1169 Type 2 diabetes mellitus with other specified complication: Secondary | ICD-10-CM

## 2022-05-19 DIAGNOSIS — I2699 Other pulmonary embolism without acute cor pulmonale: Secondary | ICD-10-CM

## 2022-05-19 DIAGNOSIS — E669 Obesity, unspecified: Secondary | ICD-10-CM | POA: Diagnosis not present

## 2022-05-19 DIAGNOSIS — G4733 Obstructive sleep apnea (adult) (pediatric): Secondary | ICD-10-CM | POA: Diagnosis not present

## 2022-05-20 ENCOUNTER — Encounter: Payer: Self-pay | Admitting: Family Medicine

## 2022-05-20 LAB — LP+NON-HDL CHOLESTEROL
Cholesterol, Total: 131 mg/dL (ref 100–199)
HDL: 34 mg/dL — ABNORMAL LOW (ref >39)
LDL Chol Calc (NIH): 52 mg/dL (ref 0–99)
Total Non-HDL-Chol (LDL+VLDL): 97 mg/dL (ref 0–129)
Triglycerides: 288 mg/dL — ABNORMAL HIGH (ref 0–149)
VLDL Cholesterol Cal: 45 mg/dL — ABNORMAL HIGH (ref 5–40)

## 2022-05-20 LAB — CMP14+EGFR
ALT: 25 IU/L (ref 0–44)
AST: 15 IU/L (ref 0–40)
Albumin/Globulin Ratio: 1.2 (ref 1.2–2.2)
Albumin: 3.8 g/dL — ABNORMAL LOW (ref 4.1–5.1)
Alkaline Phosphatase: 77 IU/L (ref 44–121)
BUN/Creatinine Ratio: 11 (ref 9–20)
BUN: 15 mg/dL (ref 6–20)
Bilirubin Total: 0.2 mg/dL (ref 0.0–1.2)
CO2: 18 mmol/L — ABNORMAL LOW (ref 20–29)
Calcium: 9 mg/dL (ref 8.7–10.2)
Chloride: 104 mmol/L (ref 96–106)
Creatinine, Ser: 1.41 mg/dL — ABNORMAL HIGH (ref 0.76–1.27)
Globulin, Total: 3.3 g/dL (ref 1.5–4.5)
Glucose: 239 mg/dL — ABNORMAL HIGH (ref 70–99)
Potassium: 5 mmol/L (ref 3.5–5.2)
Sodium: 137 mmol/L (ref 134–144)
Total Protein: 7.1 g/dL (ref 6.0–8.5)
eGFR: 66 mL/min/{1.73_m2} (ref >59)

## 2022-05-20 LAB — HEMOGLOBIN A1C
Est. average glucose Bld gHb Est-mCnc: 186 mg/dL
Hgb A1c MFr Bld: 8.1 % — ABNORMAL HIGH (ref 4.8–5.6)

## 2022-05-20 LAB — PROTIME-INR
INR: 1.8 — ABNORMAL HIGH (ref 0.9–1.2)
Prothrombin Time: 19.1 s — ABNORMAL HIGH (ref 9.1–12.0)

## 2022-05-22 ENCOUNTER — Other Ambulatory Visit: Payer: Self-pay | Admitting: Pharmacist

## 2022-05-22 ENCOUNTER — Other Ambulatory Visit: Payer: Self-pay

## 2022-05-22 ENCOUNTER — Encounter: Payer: Self-pay | Admitting: Family Medicine

## 2022-05-22 DIAGNOSIS — E1169 Type 2 diabetes mellitus with other specified complication: Secondary | ICD-10-CM

## 2022-05-22 MED ORDER — SEMAGLUTIDE (1 MG/DOSE) 4 MG/3ML ~~LOC~~ SOPN
1.0000 mg | PEN_INJECTOR | SUBCUTANEOUS | 0 refills | Status: DC
  Filled 2022-05-22 – 2022-05-29 (×2): qty 3, 28d supply, fill #0

## 2022-05-22 MED ORDER — SEMAGLUTIDE (2 MG/DOSE) 8 MG/3ML ~~LOC~~ SOPN
2.0000 mg | PEN_INJECTOR | SUBCUTANEOUS | 6 refills | Status: DC
  Filled 2022-05-22: qty 3, fill #0
  Filled 2022-06-26: qty 3, 28d supply, fill #0

## 2022-05-23 ENCOUNTER — Other Ambulatory Visit: Payer: Self-pay | Admitting: Family Medicine

## 2022-05-23 DIAGNOSIS — G4733 Obstructive sleep apnea (adult) (pediatric): Secondary | ICD-10-CM | POA: Diagnosis not present

## 2022-05-23 DIAGNOSIS — E1169 Type 2 diabetes mellitus with other specified complication: Secondary | ICD-10-CM

## 2022-05-23 DIAGNOSIS — I2699 Other pulmonary embolism without acute cor pulmonale: Secondary | ICD-10-CM | POA: Diagnosis not present

## 2022-05-23 DIAGNOSIS — J189 Pneumonia, unspecified organism: Secondary | ICD-10-CM | POA: Diagnosis not present

## 2022-05-25 NOTE — Telephone Encounter (Signed)
Requested Prescriptions  Pending Prescriptions Disp Refills  . atorvastatin (LIPITOR) 40 MG tablet [Pharmacy Med Name: ATORVASTATIN 40 MG TABLET] 90 tablet 3    Sig: TAKE 1 TABLET BY MOUTH EVERY DAY     Cardiovascular:  Antilipid - Statins Failed - 05/23/2022 12:17 AM      Failed - Lipid Panel in normal range within the last 12 months    Cholesterol, Total  Date Value Ref Range Status  05/19/2022 131 100 - 199 mg/dL Final   LDL Chol Calc (NIH)  Date Value Ref Range Status  05/19/2022 52 0 - 99 mg/dL Final   HDL  Date Value Ref Range Status  05/19/2022 34 (L) >39 mg/dL Final   Triglycerides  Date Value Ref Range Status  05/19/2022 288 (H) 0 - 149 mg/dL Final         Passed - Patient is not pregnant      Passed - Valid encounter within last 12 months    Recent Outpatient Visits          1 week ago Hypertension associated with diabetes ([place])   [place], [place], MD   6 months ago Diabetes mellitus type 2 in obese [facility])   [place], Charlane Ferretti, MD   10 months ago Acute respiratory failure with hypoxia [facility])   [facility] Charlott Rakes, MD   1 year ago Morbid obesity [facility])   [place], Charlane Ferretti, MD   1 year ago Type 2 diabetes mellitus with other specified complication, with long-term current use of insulin ([place])   [place], Enobong, MD      Future Appointments            In 2 months Charlott Rakes, MD [place]

## 2022-05-29 ENCOUNTER — Encounter: Payer: Self-pay | Admitting: Family Medicine

## 2022-05-29 ENCOUNTER — Other Ambulatory Visit: Payer: Self-pay

## 2022-05-29 ENCOUNTER — Other Ambulatory Visit: Payer: Self-pay | Admitting: Family Medicine

## 2022-05-29 MED ORDER — WARFARIN SODIUM 10 MG PO TABS: 10.0000 mg | ORAL_TABLET | Freq: Every day | ORAL | 0 refills | Status: DC

## 2022-06-02 ENCOUNTER — Other Ambulatory Visit: Payer: Self-pay | Admitting: Family Medicine

## 2022-06-02 ENCOUNTER — Encounter: Payer: Self-pay | Admitting: Physician Assistant

## 2022-06-03 ENCOUNTER — Encounter: Payer: Self-pay | Admitting: Family Medicine

## 2022-06-03 ENCOUNTER — Telehealth: Payer: Self-pay | Admitting: Hematology and Oncology

## 2022-06-03 ENCOUNTER — Other Ambulatory Visit: Payer: Self-pay | Admitting: Family Medicine

## 2022-06-03 MED ORDER — WARFARIN SODIUM 10 MG PO TABS: 10.0000 mg | ORAL_TABLET | Freq: Every day | ORAL | 0 refills | Status: DC

## 2022-06-03 NOTE — Telephone Encounter (Signed)
Per 10/4 secure chat called and left message for pt about appointment

## 2022-06-04 ENCOUNTER — Ambulatory Visit: Payer: BC Managed Care – PPO | Admitting: Family Medicine

## 2022-06-08 DIAGNOSIS — E1122 Type 2 diabetes mellitus with diabetic chronic kidney disease: Secondary | ICD-10-CM | POA: Diagnosis not present

## 2022-06-08 DIAGNOSIS — Z7984 Long term (current) use of oral hypoglycemic drugs: Secondary | ICD-10-CM | POA: Diagnosis not present

## 2022-06-08 DIAGNOSIS — Z86718 Personal history of other venous thrombosis and embolism: Secondary | ICD-10-CM | POA: Diagnosis not present

## 2022-06-08 DIAGNOSIS — I2782 Chronic pulmonary embolism: Secondary | ICD-10-CM | POA: Diagnosis not present

## 2022-06-08 DIAGNOSIS — I129 Hypertensive chronic kidney disease with stage 1 through stage 4 chronic kidney disease, or unspecified chronic kidney disease: Secondary | ICD-10-CM | POA: Diagnosis not present

## 2022-06-08 DIAGNOSIS — I5032 Chronic diastolic (congestive) heart failure: Secondary | ICD-10-CM | POA: Diagnosis not present

## 2022-06-08 DIAGNOSIS — I2724 Chronic thromboembolic pulmonary hypertension: Secondary | ICD-10-CM | POA: Diagnosis not present

## 2022-06-08 DIAGNOSIS — N182 Chronic kidney disease, stage 2 (mild): Secondary | ICD-10-CM | POA: Diagnosis not present

## 2022-06-08 DIAGNOSIS — G4733 Obstructive sleep apnea (adult) (pediatric): Secondary | ICD-10-CM | POA: Diagnosis not present

## 2022-06-08 DIAGNOSIS — E1165 Type 2 diabetes mellitus with hyperglycemia: Secondary | ICD-10-CM | POA: Diagnosis not present

## 2022-06-08 DIAGNOSIS — Z79899 Other long term (current) drug therapy: Secondary | ICD-10-CM | POA: Diagnosis not present

## 2022-06-08 DIAGNOSIS — Z7985 Long-term (current) use of injectable non-insulin antidiabetic drugs: Secondary | ICD-10-CM | POA: Diagnosis not present

## 2022-06-08 DIAGNOSIS — Z888 Allergy status to other drugs, medicaments and biological substances status: Secondary | ICD-10-CM | POA: Diagnosis not present

## 2022-06-08 DIAGNOSIS — Z01810 Encounter for preprocedural cardiovascular examination: Secondary | ICD-10-CM | POA: Diagnosis not present

## 2022-06-08 DIAGNOSIS — I130 Hypertensive heart and chronic kidney disease with heart failure and stage 1 through stage 4 chronic kidney disease, or unspecified chronic kidney disease: Secondary | ICD-10-CM | POA: Diagnosis not present

## 2022-06-08 DIAGNOSIS — Z7901 Long term (current) use of anticoagulants: Secondary | ICD-10-CM | POA: Diagnosis not present

## 2022-06-08 DIAGNOSIS — I272 Pulmonary hypertension, unspecified: Secondary | ICD-10-CM | POA: Diagnosis not present

## 2022-06-08 DIAGNOSIS — J9611 Chronic respiratory failure with hypoxia: Secondary | ICD-10-CM | POA: Diagnosis not present

## 2022-06-10 ENCOUNTER — Telehealth: Payer: Self-pay | Admitting: Emergency Medicine

## 2022-06-10 ENCOUNTER — Encounter: Payer: Self-pay | Admitting: Family Medicine

## 2022-06-10 NOTE — Telephone Encounter (Signed)
Copied from [place] [id number]. Topic: Appointment Scheduling - Scheduling Inquiry for Clinic >> Jun 10, 2022  9:01 AM Everette C wrote: Reason for CRM: Pr. Audley Hose with [facility] has called to request orders for the patient to have an INR check pending hospital discharge   Please contact the Provider further when possible regarding the request

## 2022-06-10 NOTE — Telephone Encounter (Signed)
Pt has been called and informed to come in tomorrow for INR check.

## 2022-06-11 ENCOUNTER — Ambulatory Visit: Payer: BC Managed Care – PPO | Admitting: Family Medicine

## 2022-06-17 ENCOUNTER — Inpatient Hospital Stay: Payer: BC Managed Care – PPO | Attending: Hematology and Oncology

## 2022-06-17 ENCOUNTER — Inpatient Hospital Stay: Payer: BC Managed Care – PPO | Admitting: Hematology and Oncology

## 2022-06-17 ENCOUNTER — Other Ambulatory Visit: Payer: Self-pay | Admitting: Hematology and Oncology

## 2022-06-17 VITALS — BP 153/91 | HR 86 | Temp 99.1°F | Resp 18 | Wt 396.8 lb

## 2022-06-17 DIAGNOSIS — Z803 Family history of malignant neoplasm of breast: Secondary | ICD-10-CM | POA: Diagnosis not present

## 2022-06-17 DIAGNOSIS — Z86718 Personal history of other venous thrombosis and embolism: Secondary | ICD-10-CM | POA: Diagnosis not present

## 2022-06-17 DIAGNOSIS — I82512 Chronic embolism and thrombosis of left femoral vein: Secondary | ICD-10-CM | POA: Diagnosis not present

## 2022-06-17 DIAGNOSIS — E119 Type 2 diabetes mellitus without complications: Secondary | ICD-10-CM | POA: Insufficient documentation

## 2022-06-17 DIAGNOSIS — Z7901 Long term (current) use of anticoagulants: Secondary | ICD-10-CM | POA: Diagnosis not present

## 2022-06-17 DIAGNOSIS — I2699 Other pulmonary embolism without acute cor pulmonale: Secondary | ICD-10-CM

## 2022-06-17 DIAGNOSIS — Z86711 Personal history of pulmonary embolism: Secondary | ICD-10-CM | POA: Diagnosis not present

## 2022-06-17 DIAGNOSIS — I10 Essential (primary) hypertension: Secondary | ICD-10-CM | POA: Diagnosis not present

## 2022-06-17 LAB — CMP (CANCER CENTER ONLY)
ALT: 28 U/L (ref 0–44)
AST: 19 U/L (ref 15–41)
Albumin: 3.9 g/dL (ref 3.5–5.0)
Alkaline Phosphatase: 65 U/L (ref 38–126)
Anion gap: 6 (ref 5–15)
BUN: 19 mg/dL (ref 6–20)
CO2: 23 mmol/L (ref 22–32)
Calcium: 8.9 mg/dL (ref 8.9–10.3)
Chloride: 110 mmol/L (ref 98–111)
Creatinine: 1.48 mg/dL — ABNORMAL HIGH (ref 0.61–1.24)
GFR, Estimated: >60 mL/min (ref >60)
Glucose, Bld: 229 mg/dL — ABNORMAL HIGH (ref 70–99)
Potassium: 4.2 mmol/L (ref 3.5–5.1)
Sodium: 139 mmol/L (ref 135–145)
Total Bilirubin: <0.1 mg/dL — ABNORMAL LOW (ref 0.3–1.2)
Total Protein: 7.5 g/dL (ref 6.5–8.1)

## 2022-06-17 LAB — CBC WITH DIFFERENTIAL (CANCER CENTER ONLY)
Abs Immature Granulocytes: 0.03 10*3/uL (ref 0.00–0.07)
Basophils Absolute: 0 10*3/uL (ref 0.0–0.1)
Basophils Relative: 0 %
Eosinophils Absolute: 0.2 10*3/uL (ref 0.0–0.5)
Eosinophils Relative: 3 %
HCT: 37.7 % — ABNORMAL LOW (ref 39.0–52.0)
Hemoglobin: 12 g/dL — ABNORMAL LOW (ref 13.0–17.0)
Immature Granulocytes: 0 %
Lymphocytes Relative: 32 %
Lymphs Abs: 2.2 10*3/uL (ref 0.7–4.0)
MCH: 25.6 pg — ABNORMAL LOW (ref 26.0–34.0)
MCHC: 31.8 g/dL (ref 30.0–36.0)
MCV: 80.6 fL (ref 80.0–100.0)
Monocytes Absolute: 0.6 10*3/uL (ref 0.1–1.0)
Monocytes Relative: 8 %
Neutro Abs: 3.9 10*3/uL (ref 1.7–7.7)
Neutrophils Relative %: 57 %
Platelet Count: 228 10*3/uL (ref 150–400)
RBC: 4.68 MIL/uL (ref 4.22–5.81)
RDW: 16.5 % — ABNORMAL HIGH (ref 11.5–15.5)
WBC Count: 6.9 10*3/uL (ref 4.0–10.5)
nRBC: 0 % (ref 0.0–0.2)

## 2022-06-17 NOTE — Progress Notes (Signed)
[facility] [phone]   [phone]  PROGRESS NOTE  Patient Care Team: Newlin, Enobong, MD as PCP - General (Family Medicine) Bethel, Tiffany, MD as PCP - Cardiology (Cardiology)   CHIEF COMPLAINTS/PURPOSE OF CONSULTATION:  History of bilateral pulmonary emboli and left lower extremity DVT  HISTORY OF PRESENTING ILLNESS:  William Moss 35 y.o. male returns for a follow up for history of bilateral pulmonary emboli and left lower extremity DVT. He continues on anticoagulation therapy with coumadin.   Ms. Ragon reports he underwent a heart cath on 06/08/2022 at [facility] and due to the findings they determined that the risk was greater than the benefit of performing his CTed surgery.  He reports that they may perform the surgery at a later time.  He notes that his breathing is improving and he has been able to walk further than before.  He reports his breathing is currently about a 7 out of 10.  He notes that he is tolerating the Coumadin therapy well and has an INR check coming up tomorrow.  He is maintaining within the therapeutic range.  He is not having any trouble with bleeding, bruising, or dark stools.  Reports that overall everything seems okay.  He does have an upcoming visit with [facility] as well for reevaluation.  He denies any nausea, vomiting or abdominal pain. His bowel habits are unchanged without any recurrent episodes of diarrhea or constipation. He denies fevers, chills, night sweats, chest pain, cough, lower extremity edema, neuropathy or other skin changes.  He has no other complaints.  Rest of the 10 point ROS is below.  MEDICAL HISTORY:  Past Medical History:  Diagnosis Date   Atypical chest pain 05/27/2016   a. 05/2016: NST showing small area of moderare anteroapical ischemia b. 05/2016: cath showing tortous but normal cors which confirmed a false positive NST.   Diabetes mellitus without complication (HCC)    GERD (gastroesophageal reflux  disease) 10/02/2016   Hypertension    Pulmonary embolism, bilateral (HCC)     SURGICAL HISTORY: Past Surgical History:  Procedure Laterality Date   CARDIAC CATHETERIZATION N/A 06/10/2016   Procedure: Left Heart Cath and Coronary Angiography;  Surgeon: Muhammad A Arida, MD;  Location: [facility];  Service: Cardiovascular;  Laterality: N/A;   COLONOSCOPY N/A 02/04/2016   Procedure: COLONOSCOPY;  Surgeon: John N Perry, MD;  Location: [facility] ENDOSCOPY;  Service: Endoscopy;  Laterality: N/A;   NO PAST SURGERIES      SOCIAL HISTORY: Social History   Socioeconomic History   Marital status: Single    Spouse name: Not on file   Number of children: 0   Years of education: Not on file   Highest education level: Not on file  Occupational History   Occupation: call center rep  Tobacco Use   Smoking status: Never   Smokeless tobacco: Never  Substance and Sexual Activity   Alcohol use: No   Drug use: No   Sexual activity: Not Currently  Other Topics Concern   Not on file  Social History Narrative   Not on file   Social Determinants of Health   Financial Resource Strain: Not on file  Food Insecurity: Not on file  Transportation Needs: Not on file  Physical Activity: Not on file  Stress: Not on file  Social Connections: Not on file  Intimate Partner Violence: Not on file    FAMILY HISTORY: Family History  Problem Relation Age of Onset   Diabetes Mother      Diabetes Father    Pulmonary embolism Father    Cancer Maternal Grandmother    Alzheimer's disease Paternal Grandmother    Breast cancer Cousin     ALLERGIES:  is allergic to lisinopril-hydrochlorothiazide.  MEDICATIONS:  Current Outpatient Medications  Medication Sig Dispense Refill   amLODipine (NORVASC) 10 MG tablet Take 1 tablet (10 mg total) by mouth daily. 90 tablet 1   atorvastatin (LIPITOR) 40 MG tablet TAKE 1 TABLET BY MOUTH EVERY DAY 90 tablet 3   Blood Glucose Monitoring Suppl (ONETOUCH VERIO REFLECT)  w/Device KIT Check blood sugar TID E11.69 1 kit 0   carvedilol (COREG) 25 MG tablet Take 1 tablet (25 mg total) by mouth 2 (two) times daily. 180 tablet 1   hydrALAZINE (APRESOLINE) 50 MG tablet Take 1 tablet (50 mg total) by mouth every 8 (eight) hours. 270 tablet 1   HYDROcodone-acetaminophen (NORCO) 5-325 MG tablet Take 1 tablet by mouth every 6 (six) hours as needed for moderate pain. 15 tablet 0   Insulin Pen Needle (BD PEN NEEDLE NANO U/F) 32G X 4 MM MISC 10 Units by Does not apply route daily. 100 each 3   Lancets (ONETOUCH DELICA PLUS XKGYJE56D) MISC CHECK BLOOD SUGAR 3 TIMES A DAY 100 each 2   metFORMIN (GLUCOPHAGE) 500 MG tablet Take 2 tablets (1,000 mg total) by mouth 2 (two) times daily with a meal. 360 tablet 1   ondansetron (ZOFRAN) 4 MG tablet Take 1 tablet (4 mg total) by mouth every 8 (eight) hours as needed for nausea or vomiting. 4 tablet 0   Semaglutide, 1 MG/DOSE, 4 MG/3ML SOPN Inject 1 mg as directed once a week. For 4 weeks then increase to 2 mg/week 3 mL 0   Semaglutide, 2 MG/DOSE, 8 MG/3ML SOPN Inject 2 mg as directed once a week. 3 mL 6   warfarin (COUMADIN) 10 MG tablet Take 1 tablet (10 mg total) by mouth daily. 30 tablet 0   No current facility-administered medications for this visit.    REVIEW OF SYSTEMS:   Constitutional: ( - ) fevers, ( - )  chills , ( - ) night sweats Eyes: ( - ) blurriness of vision, ( - ) double vision, ( - ) watery eyes Ears, nose, mouth, throat, and face: ( - ) mucositis, ( - ) sore throat Respiratory: ( - ) cough, ( + ) dyspnea, ( - ) wheezes Cardiovascular: ( - ) palpitation, ( - ) chest discomfort, ( - ) lower extremity swelling Gastrointestinal:  ( - ) nausea, ( - ) heartburn, ( - ) change in bowel habits Skin: ( - ) abnormal skin rashes Lymphatics: ( - ) new lymphadenopathy, ( - ) easy bruising Neurological: ( - ) numbness, ( - ) tingling, ( - ) new weaknesses Behavioral/Psych: ( - ) mood change, ( - ) new changes  All other systems  were reviewed with the patient and are negative.  PHYSICAL EXAMINATION: ECOG PERFORMANCE STATUS: 1 - Symptomatic but completely ambulatory  Vitals:   06/17/22 1557  BP: (!) 153/91  Pulse: 86  Resp: 18  Temp: 99.1 F (37.3 C)  SpO2: 93%   Filed Weights   06/17/22 1557  Weight: (!) 396 lb 12.8 oz (180 kg)    GENERAL: well appearing male in NAD, obese SKIN: skin color, texture, turgor are normal, no rashes or significant lesions EYES: conjunctiva are pink and non-injected, sclera clear LUNGS: clear to auscultation and percussion with normal breathing effort HEART: regular rate & rhythm  and no murmurs and no lower extremity edema Musculoskeletal: no cyanosis of digits and no clubbing  PSYCH: alert & oriented x 3, fluent speech NEURO: no focal motor/sensory deficits  LABORATORY DATA:  I have reviewed the data as listed    Latest Ref Rng & Units 06/17/2022    3:19 PM 12/26/2021    9:51 AM 09/21/2021    7:58 PM  CBC  WBC 4.0 - 10.5 K/uL 6.9  7.2  12.0   Hemoglobin 13.0 - 17.0 g/dL 12.0  11.4  14.0   Hematocrit 39.0 - 52.0 % 37.7  36.4  45.4   Platelets 150 - 400 K/uL 228  259  283        Latest Ref Rng & Units 06/17/2022    3:19 PM 05/19/2022    9:02 AM 12/26/2021    9:51 AM  CMP  Glucose 70 - 99 mg/dL 229  239  111   BUN 6 - 20 mg/dL _0 Creatinine 0.61 - 1.24 mg/dL 1.48  1.41  1.43   Sodium 135 - 145 mmol/L 139  137  139   Potassium 3.5 - 5.1 mmol/L 4.2  5.0  3.9   Chloride 98 - 111 mmol/L 110  104  108   CO2 22 - 32 mmol/L _1 Calcium 8.9 - 10.3 mg/dL 8.9  9.0  8.9   Total Protein 6.5 - 8.1 g/dL 7.5  7.1  7.4   Total Bilirubin 0.3 - 1.2 mg/dL <0.1  0.2  0.3   Alkaline Phos 38 - 126 U/L 65  77  53   AST 15 - 41 U/L _2 ALT 0 - 44 U/L _3 ASSESSMENT & PLAN William Moss is a 36 y.o. male who returns for a follow up for history of bilateral pulmonary emboli and left lower extremity DVT.   #Bilateral PE and left leg  DVT: --Likely provoking factors include pneumonia and prolonged immobility due to sedentary lifestyle.  --Currently on coumadin, INR in therapeutic range.  --Hypercoagulable workup from November 2022 was unremarkable. --Patient continues to be sedentary and has evidence of residual pulmonary embolism seen on NM perfusion scan from February 2023. He is following with [facility] for consideration of CTEPH therapies --Recommend to continue on anticoagulation in the setting of continued sedentary lifestyle, persistent dyspnea on exertion and residual PE.  --Labs today show white blood cell count 6.9, hemoglobin 12.0, MCV 80.6, and platelets of 228 --RTC in 6 months with labs  #Cord-like nodularity involving R upper extremity-resolved: --PICC line was placed on R upper extremity during recent hospitalization --Doppler ultrasound of the right upper extremity from 08/13/2021 showed no evidence of DVT in the R upper extremity.   No orders of the defined types were placed in this encounter.   All questions were answered. The patient knows to call the clinic with any problems, questions or concerns.  I have spent a total of 25 minutes minutes of face-to-face and non-face-to-face time, preparing to see the patient, performing a medically appropriate examination, counseling and educating the patient, ordering tests, documenting clinical information in the electronic health record,  and care coordination.   Ledell Peoples, MD Department of Hematology/Oncology [place] [facility] Phone: [phone] Pager: [phone] Email: [email or web address]

## 2022-06-18 ENCOUNTER — Encounter: Payer: Self-pay | Admitting: Family Medicine

## 2022-06-18 ENCOUNTER — Other Ambulatory Visit: Payer: BC Managed Care – PPO

## 2022-06-18 ENCOUNTER — Ambulatory Visit: Payer: BC Managed Care – PPO | Attending: Family Medicine | Admitting: Family Medicine

## 2022-06-18 ENCOUNTER — Ambulatory Visit: Payer: BC Managed Care – PPO | Admitting: Hematology and Oncology

## 2022-06-18 VITALS — BP 165/89 | HR 82 | Temp 98.4°F | Ht 69.0 in | Wt 396.0 lb

## 2022-06-18 DIAGNOSIS — I2699 Other pulmonary embolism without acute cor pulmonale: Secondary | ICD-10-CM | POA: Diagnosis not present

## 2022-06-18 DIAGNOSIS — I152 Hypertension secondary to endocrine disorders: Secondary | ICD-10-CM | POA: Diagnosis not present

## 2022-06-18 DIAGNOSIS — E1159 Type 2 diabetes mellitus with other circulatory complications: Secondary | ICD-10-CM | POA: Diagnosis not present

## 2022-06-18 DIAGNOSIS — G4733 Obstructive sleep apnea (adult) (pediatric): Secondary | ICD-10-CM | POA: Diagnosis not present

## 2022-06-18 LAB — POCT INR: POC INR: 1.9

## 2022-06-18 MED ORDER — WARFARIN SODIUM 10 MG PO TABS: 10.0000 mg | ORAL_TABLET | Freq: Every day | ORAL | 0 refills | Status: DC

## 2022-06-18 NOTE — Progress Notes (Signed)
Subjective:  Patient ID: William Moss, male    DOB: 1986-08-10  Age: 36 y.o. MRN: [id number]  CC: Coagulation Disorder   HPI William Moss is a 36 y.o. year old male with a history of hypertension, type 2 diabetes mellitus (A1c 7.6) , morbid obesity, GERD hospitalized for COVID-19 pneumonia in 06/2019, bilateral pulmonary embolism in 05/2021 s/p TPA, chronic thromboembolic pulmonary hypertension.  Interval History: He presents today for an INR check.  He had a RHC at [facility] but previously planned elective pulmonary endarterectomy with or without embolectomy with cardiopulmonary bypass was cancelled. RHC revealed normal filling pressures with a cardiac index of 2.7, mild pulmonary hypertension with mean PA-25 mmHg. He is on 86m of Coumadin and is doing well.  INR is 1.9 today.  BP is elevated and he is yet to take his antihypertensive Past Medical History:  Diagnosis Date   Atypical chest pain 05/27/2016   a. 05/2016: NST showing small area of moderare anteroapical ischemia b. 05/2016: cath showing tortous but normal cors which confirmed a false positive NST.   [place]    GERD (gastroesophageal reflux disease) 10/02/2016   Hypertension    Pulmonary embolism, bilateral (HCC)     Past Surgical History:  Procedure Laterality Date   CARDIAC CATHETERIZATION N/A 06/10/2016   Procedure: Left Heart Cath and Coronary Angiography;  Surgeon: MWellington Hampshire MD;  [facility];  Service: Cardiovascular;  Laterality: N/A;   COLONOSCOPY N/A 02/04/2016   Procedure: COLONOSCOPY;  Surgeon: JIrene Shipper MD;  [facility];  Service: Endoscopy;  Laterality: N/A;   NO PAST SURGERIES      Family History  Problem Relation Age of Onset   Diabetes Mother    Diabetes Father    Pulmonary embolism Father    Cancer Maternal Grandmother    Alzheimer's disease Paternal Grandmother    Breast cancer Cousin     Social History    Socioeconomic History   Marital status: Single    Spouse name: Not on file   Number of children: 0   Years of education: Not on file   Highest education level: Not on file  Occupational History   Occupation: call center rep  Tobacco Use   Smoking status: Never   Smokeless tobacco: Never  Substance and Sexual Activity   Alcohol use: No   Drug use: No   Sexual activity: Not Currently  Other Topics Concern   Not on file  Social History Narrative   Not on file   Social Determinants of Health   Financial Resource Strain: Not on file  Food Insecurity: Not on file  Transportation Needs: Not on file  Physical Activity: Not on file  Stress: Not on file  Social Connections: Not on file    Allergies  Allergen Reactions   Lisinopril-Hydrochlorothiazide Other (See Comments)    "Interfered with the pH level in the serum"    Outpatient Medications Prior to Visit  Medication Sig Dispense Refill   amLODipine (NORVASC) 10 MG tablet Take 1 tablet (10 mg total) by mouth daily. 90 tablet 1   atorvastatin (LIPITOR) 40 MG tablet TAKE 1 TABLET BY MOUTH EVERY DAY 90 tablet 3   Blood Glucose Monitoring Suppl (ONETOUCH VERIO REFLECT) w/Device KIT Check blood sugar TID E11.69 1 kit 0   carvedilol (COREG) 25 MG tablet Take 1 tablet (25 mg total) by mouth 2 (two) times daily. 180 tablet 1   hydrALAZINE (APRESOLINE) 50 MG  tablet Take 1 tablet (50 mg total) by mouth every 8 (eight) hours. 270 tablet 1   HYDROcodone-acetaminophen (NORCO) 5-325 MG tablet Take 1 tablet by mouth every 6 (six) hours as needed for moderate pain. 15 tablet 0   Insulin Pen Needle (BD PEN NEEDLE NANO U/F) 32G X 4 MM MISC 10 Units by Does not apply route daily. 100 each 3   Lancets (ONETOUCH DELICA PLUS [id number]) MISC CHECK BLOOD SUGAR 3 TIMES A DAY 100 each 2   metFORMIN (GLUCOPHAGE) 500 MG tablet Take 2 tablets (1,000 mg total) by mouth 2 (two) times daily with a meal. 360 tablet 1   ondansetron (ZOFRAN) 4 MG tablet Take  1 tablet (4 mg total) by mouth every 8 (eight) hours as needed for nausea or vomiting. 4 tablet 0   Semaglutide, 1 MG/DOSE, 4 MG/3ML SOPN Inject 1 mg as directed once a week. For 4 weeks then increase to 2 mg/week 3 mL 0   Semaglutide, 2 MG/DOSE, 8 MG/3ML SOPN Inject 2 mg as directed once a week. 3 mL 6   warfarin (COUMADIN) 10 MG tablet Take 1 tablet (10 mg total) by mouth daily. 30 tablet 0   No facility-administered medications prior to visit.     ROS Review of Systems  Constitutional:  Negative for activity change and appetite change.  HENT:  Negative for sinus pressure and sore throat.   Respiratory:  Negative for chest tightness, shortness of breath and wheezing.   Cardiovascular:  Negative for chest pain and palpitations.  Gastrointestinal:  Negative for abdominal distention, abdominal pain and constipation.  Genitourinary: Negative.   Musculoskeletal: Negative.   Psychiatric/Behavioral:  Negative for behavioral problems and dysphoric mood.     Objective:  BP (!) 165/89   Pulse 82   Temp 98.4 F (36.9 C) (Oral)   Ht _0  (1.753 m)   Wt (!) 396 lb (179.6 kg)   SpO2 94%   BMI 58.48 kg/m      06/18/2022   10:12 AM 06/17/2022    3:57 PM 04/10/2022    9:15 AM  BP/Weight  Systolic BP 128 786 767  Diastolic BP 89 91 74  Wt. (Lbs) 396 396.8 399.6  BMI 58.48 kg/m2 58.6 kg/m2 59.01 kg/m2      Physical Exam Constitutional:      Appearance: He is well-developed. He is obese.  Cardiovascular:     Rate and Rhythm: Normal rate.     Heart sounds: Normal heart sounds. No murmur heard. Pulmonary:     Effort: Pulmonary effort is normal.     Breath sounds: Normal breath sounds. No wheezing or rales.  Chest:     Chest wall: No tenderness.  Abdominal:     General: Bowel sounds are normal. There is no distension.     Palpations: Abdomen is soft. There is no mass.     Tenderness: There is no abdominal tenderness.  Musculoskeletal:        General: Normal range of motion.      Right lower leg: No edema.     Left lower leg: No edema.  Neurological:     Mental Status: He is alert and oriented to person, place, and time.  Psychiatric:        Mood and Affect: Mood normal.        Latest Ref Rng & Units 06/17/2022    3:19 PM 05/19/2022    9:02 AM 12/26/2021    9:51 AM  CMP  Glucose 70 -  99 mg/dL 229  239  111   BUN 6 - 20 mg/dL _0 Creatinine 0.61 - 1.24 mg/dL 1.48  1.41  1.43   Sodium 135 - 145 mmol/L 139  137  139   Potassium 3.5 - 5.1 mmol/L 4.2  5.0  3.9   Chloride 98 - 111 mmol/L 110  104  108   CO2 22 - 32 mmol/L _1 Calcium 8.9 - 10.3 mg/dL 8.9  9.0  8.9   Total Protein 6.5 - 8.1 g/dL 7.5  7.1  7.4   Total Bilirubin 0.3 - 1.2 mg/dL <0.1  0.2  0.3   Alkaline Phos 38 - 126 U/L 65  77  53   AST 15 - 41 U/L _2 ALT 0 - 44 U/L _3 Lipid Panel     Component Value Date/Time   CHOL 131 05/19/2022 0902   TRIG 288 (H) 05/19/2022 0902   HDL 34 (L) 05/19/2022 0902   CHOLHDL 3.1 04/17/2021 1011   CHOLHDL 3.5 06/29/2019 0005   VLDL 17 06/29/2019 0005   LDLCALC 52 05/19/2022 0902    CBC    Component Value Date/Time   WBC 6.9 06/17/2022 1519   WBC 12.0 (H) 09/21/2021 1958   RBC 4.68 06/17/2022 1519   HGB 12.0 (L) 06/17/2022 1519   HCT 37.7 (L) 06/17/2022 1519   PLT 228 06/17/2022 1519   MCV 80.6 06/17/2022 1519   MCH 25.6 (L) 06/17/2022 1519   MCHC 31.8 06/17/2022 1519   RDW 16.5 (H) 06/17/2022 1519   LYMPHSABS 2.2 06/17/2022 1519   MONOABS 0.6 06/17/2022 1519   EOSABS 0.2 06/17/2022 1519   BASOSABS 0.0 06/17/2022 1519    Lab Results  Component Value Date   HGBA1C 8.1 (H) 05/19/2022    Lab Results  Component Value Date   INR 1.9 06/18/2022   INR 1.8 (H) 05/19/2022   INR 2.4 01/27/2022     Assessment & Plan:  1. Acute pulmonary embolism, unspecified pulmonary embolism type, unspecified whether acute cor pulmonale present [facility]) He is on lifelong anticoagulation with Coumadin Seen by  pulmonary and is status post right heart cath at [facility] Slightly subtherapeutic INR of 1.9 Coumadin doses were held just before his right heart cath hence I will make no regimen changes at this time He does have an upcoming visit with the clinical pharmacist hence I will make no regimen changes - POCT INR  2. Hypertension associated with diabetes ([place]) Uncontrolled due to the fact that he is yet to take his antihypertensive Encouraged to adhere to his antihypertensives and blood pressure will be checked at next visit. Counseled on blood pressure goal of less than 130/80, low-sodium, DASH diet, medication compliance, 150 minutes of moderate intensity exercise per week. Discussed medication compliance, adverse effects.    Meds ordered this encounter  Medications   warfarin (COUMADIN) 10 MG tablet    Sig: Take 1 tablet (10 mg total) by mouth daily.    Dispense:  30 tablet    Refill:  0    Please make INR appointment with Lurena Joiner.    Follow-up: Return for previously scheduled appointment.       Charlott Rakes, MD, FAAFP. [facility] [place] [place], [place]   06/18/2022, 10:34 AM

## 2022-06-18 NOTE — Patient Instructions (Signed)

## 2022-06-22 DIAGNOSIS — I2699 Other pulmonary embolism without acute cor pulmonale: Secondary | ICD-10-CM | POA: Diagnosis not present

## 2022-06-22 DIAGNOSIS — G4733 Obstructive sleep apnea (adult) (pediatric): Secondary | ICD-10-CM | POA: Diagnosis not present

## 2022-06-22 DIAGNOSIS — J189 Pneumonia, unspecified organism: Secondary | ICD-10-CM | POA: Diagnosis not present

## 2022-06-27 ENCOUNTER — Other Ambulatory Visit (HOSPITAL_COMMUNITY): Payer: Self-pay

## 2022-06-28 ENCOUNTER — Encounter: Payer: Self-pay | Admitting: Family Medicine

## 2022-06-28 ENCOUNTER — Other Ambulatory Visit: Payer: Self-pay | Admitting: Internal Medicine

## 2022-06-28 DIAGNOSIS — E1169 Type 2 diabetes mellitus with other specified complication: Secondary | ICD-10-CM

## 2022-06-28 MED ORDER — SEMAGLUTIDE (1 MG/DOSE) 4 MG/3ML ~~LOC~~ SOPN
1.0000 mg | PEN_INJECTOR | SUBCUTANEOUS | 0 refills | Status: DC
  Filled 2022-06-28: qty 3, 28d supply, fill #0

## 2022-06-29 ENCOUNTER — Other Ambulatory Visit: Payer: Self-pay

## 2022-07-01 ENCOUNTER — Other Ambulatory Visit: Payer: Self-pay

## 2022-07-03 ENCOUNTER — Other Ambulatory Visit (HOSPITAL_COMMUNITY): Payer: Self-pay

## 2022-07-06 ENCOUNTER — Ambulatory Visit: Payer: BC Managed Care – PPO | Admitting: Pharmacist

## 2022-07-06 ENCOUNTER — Encounter: Payer: Self-pay | Admitting: Family Medicine

## 2022-07-06 ENCOUNTER — Other Ambulatory Visit: Payer: Self-pay | Admitting: Family Medicine

## 2022-07-07 MED ORDER — WARFARIN SODIUM 10 MG PO TABS: 10.0000 mg | ORAL_TABLET | Freq: Every day | ORAL | 0 refills | Status: DC

## 2022-07-19 DIAGNOSIS — G4733 Obstructive sleep apnea (adult) (pediatric): Secondary | ICD-10-CM | POA: Diagnosis not present

## 2022-07-20 ENCOUNTER — Encounter: Payer: Self-pay | Admitting: Family Medicine

## 2022-07-20 ENCOUNTER — Ambulatory Visit: Payer: BC Managed Care – PPO | Attending: Family Medicine | Admitting: Family Medicine

## 2022-07-20 VITALS — BP 120/83 | HR 80 | Temp 98.7°F | Resp 16 | Ht 69.0 in | Wt 387.0 lb

## 2022-07-20 DIAGNOSIS — E669 Obesity, unspecified: Secondary | ICD-10-CM | POA: Diagnosis not present

## 2022-07-20 DIAGNOSIS — I82512 Chronic embolism and thrombosis of left femoral vein: Secondary | ICD-10-CM | POA: Diagnosis not present

## 2022-07-20 DIAGNOSIS — E1169 Type 2 diabetes mellitus with other specified complication: Secondary | ICD-10-CM

## 2022-07-20 LAB — POCT INR: POC INR: 3.7

## 2022-07-20 LAB — GLUCOSE, POCT (MANUAL RESULT ENTRY): POC Glucose: 111 mg/dl — AB (ref 70–99)

## 2022-07-20 NOTE — Progress Notes (Signed)
Subjective:  Patient ID: William Moss, male    DOB: May 15, 1986  Age: 36 y.o. MRN: [id number]  CC: Coagulation Disorder   HPI William Moss is a 36 y.o. year old male with a history of  hypertension, type 2 diabetes mellitus (A1c 8.1) , morbid obesity, GERD hospitalized for COVID-19 pneumonia in 06/2019, bilateral pulmonary embolism in 05/2021 s/p TPA, chronic thromboembolic pulmonary hypertension.   Interval History: Has appointment with the clinical pharmacist for INR check was rescheduled until 08/07/2022 and he is due for an INR check.  Last INR 1 month ago at 1.9. He has been on Coumadin 4m daily and INR is 3.7 today. He has not been on any new medications and the only change in his lifestyle has been walking about a mile a day.  Appointment for chronic disease management comes up next month. Past Medical History:  Diagnosis Date   Atypical chest pain 05/27/2016   a. 05/2016: NST showing small area of moderare anteroapical ischemia b. 05/2016: cath showing tortous but normal cors which confirmed a false positive NST.   Diabetes mellitus without complication ([facility]    GERD (gastroesophageal reflux disease) 10/02/2016   Hypertension    Pulmonary embolism, bilateral (HCC)     Past Surgical History:  Procedure Laterality Date   CARDIAC CATHETERIZATION N/A 06/10/2016   Procedure: Left Heart Cath and Coronary Angiography;  Surgeon: MWellington Hampshire MD;  [facility];  Service: Cardiovascular;  Laterality: N/A;   COLONOSCOPY N/A 02/04/2016   Procedure: COLONOSCOPY;  Surgeon: JIrene Shipper MD;  Location: [facility];  Service: Endoscopy;  Laterality: N/A;   NO PAST SURGERIES      Family History  Problem Relation Age of Onset   Diabetes Mother    Diabetes Father    Pulmonary embolism Father    Cancer Maternal Grandmother    Alzheimer's disease Paternal Grandmother    Breast cancer Cousin     Social History   Socioeconomic History   Marital status: Single     Spouse name: Not on file   Number of children: 0   Years of education: Not on file   Highest education level: Not on file  Occupational History   Occupation: call center rep  Tobacco Use   Smoking status: Never   Smokeless tobacco: Never  Substance and Sexual Activity   Alcohol use: No   Drug use: No   Sexual activity: Not Currently  Other Topics Concern   Not on file  Social History Narrative   Not on file   Social Determinants of Health   Financial Resource Strain: Not on file  Food Insecurity: Not on file  Transportation Needs: Not on file  Physical Activity: Not on file  Stress: Not on file  Social Connections: Not on file    Allergies  Allergen Reactions   Lisinopril-Hydrochlorothiazide Other (See Comments)    "Interfered with the pH level in the serum"    Outpatient Medications Prior to Visit  Medication Sig Dispense Refill   amLODipine (NORVASC) 10 MG tablet Take 1 tablet (10 mg total) by mouth daily. 90 tablet 1   atorvastatin (LIPITOR) 40 MG tablet TAKE 1 TABLET BY MOUTH EVERY DAY 90 tablet 3   Blood Glucose Monitoring Suppl (ONETOUCH VERIO REFLECT) w/Device KIT Check blood sugar TID E11.69 1 kit 0   carvedilol (COREG) 25 MG tablet Take 1 tablet (25 mg total) by mouth 2 (two) times daily. 180 tablet 1   hydrALAZINE (APRESOLINE)  50 MG tablet Take 1 tablet (50 mg total) by mouth every 8 (eight) hours. 270 tablet 1   HYDROcodone-acetaminophen (NORCO) 5-325 MG tablet Take 1 tablet by mouth every 6 (six) hours as needed for moderate pain. 15 tablet 0   Insulin Pen Needle (BD PEN NEEDLE NANO U/F) 32G X 4 MM MISC 10 Units by Does not apply route daily. 100 each 3   Lancets (ONETOUCH DELICA PLUS QMVHQI69G) MISC CHECK BLOOD SUGAR 3 TIMES A DAY 100 each 2   metFORMIN (GLUCOPHAGE) 500 MG tablet Take 2 tablets (1,000 mg total) by mouth 2 (two) times daily with a meal. 360 tablet 1   ondansetron (ZOFRAN) 4 MG tablet Take 1 tablet (4 mg total) by mouth every 8 (eight)  hours as needed for nausea or vomiting. 4 tablet 0   Semaglutide, 1 MG/DOSE, 4 MG/3ML SOPN Inject 1 mg as directed once a week. For 4 weeks then increase to 2 mg/week 3 mL 0   warfarin (COUMADIN) 10 MG tablet Take 1 tablet (10 mg total) by mouth daily. 30 tablet 0   No facility-administered medications prior to visit.     ROS Review of Systems  Constitutional:  Negative for activity change and appetite change.  HENT:  Negative for sinus pressure and sore throat.   Respiratory:  Negative for chest tightness, shortness of breath and wheezing.   Cardiovascular:  Negative for chest pain and palpitations.  Gastrointestinal:  Negative for abdominal distention, abdominal pain and constipation.  Genitourinary: Negative.   Musculoskeletal: Negative.   Psychiatric/Behavioral:  Negative for behavioral problems and dysphoric mood.     Objective:  BP 120/83 (BP Location: Left Arm, Patient Position: Sitting, Cuff Size: Large)   Pulse 80   Temp 98.7 F (37.1 C) (Oral)   Resp 16   Ht _0  (1.753 m)   Wt (!) 387 lb (175.5 kg)   SpO2 95%   BMI 57.15 kg/m      07/20/2022    8:46 AM 06/18/2022   10:12 AM 06/17/2022    3:57 PM  BP/Weight  Systolic BP 295 284 132  Diastolic BP 83 89 91  Wt. (Lbs) 387 396 396.8  BMI 57.15 kg/m2 58.48 kg/m2 58.6 kg/m2      Physical Exam Constitutional:      Appearance: He is well-developed.  Cardiovascular:     Rate and Rhythm: Normal rate.     Heart sounds: Normal heart sounds. No murmur heard. Pulmonary:     Effort: Pulmonary effort is normal.     Breath sounds: Normal breath sounds. No wheezing or rales.  Chest:     Chest wall: No tenderness.  Abdominal:     General: Bowel sounds are normal. There is no distension.     Palpations: Abdomen is soft. There is no mass.     Tenderness: There is no abdominal tenderness.  Musculoskeletal:        General: Normal range of motion.     Right lower leg: No edema.     Left lower leg: No edema.   Neurological:     Mental Status: He is alert and oriented to person, place, and time.  Psychiatric:        Mood and Affect: Mood normal.        Latest Ref Rng & Units 06/17/2022    3:19 PM 05/19/2022    9:02 AM 12/26/2021    9:51 AM  CMP  Glucose 70 - 99 mg/dL 229  239  111  BUN 6 - 20 mg/dL _0 Creatinine 0.61 - 1.24 mg/dL 1.48  1.41  1.43   Sodium 135 - 145 mmol/L 139  137  139   Potassium 3.5 - 5.1 mmol/L 4.2  5.0  3.9   Chloride 98 - 111 mmol/L 110  104  108   CO2 22 - 32 mmol/L _1 Calcium 8.9 - 10.3 mg/dL 8.9  9.0  8.9   Total Protein 6.5 - 8.1 g/dL 7.5  7.1  7.4   Total Bilirubin 0.3 - 1.2 mg/dL <0.1  0.2  0.3   Alkaline Phos 38 - 126 U/L 65  77  53   AST 15 - 41 U/L _2 ALT 0 - 44 U/L _3 Lipid Panel     Component Value Date/Time   CHOL 131 05/19/2022 0902   TRIG 288 (H) 05/19/2022 0902   HDL 34 (L) 05/19/2022 0902   CHOLHDL 3.1 04/17/2021 1011   CHOLHDL 3.5 06/29/2019 0005   VLDL 17 06/29/2019 0005   LDLCALC 52 05/19/2022 0902    CBC    Component Value Date/Time   WBC 6.9 06/17/2022 1519   WBC 12.0 (H) 09/21/2021 1958   RBC 4.68 06/17/2022 1519   HGB 12.0 (L) 06/17/2022 1519   HCT 37.7 (L) 06/17/2022 1519   PLT 228 06/17/2022 1519   MCV 80.6 06/17/2022 1519   MCH 25.6 (L) 06/17/2022 1519   MCHC 31.8 06/17/2022 1519   RDW 16.5 (H) 06/17/2022 1519   LYMPHSABS 2.2 06/17/2022 1519   MONOABS 0.6 06/17/2022 1519   EOSABS 0.2 06/17/2022 1519   BASOSABS 0.0 06/17/2022 1519    Lab Results  Component Value Date   HGBA1C 8.1 (H) 05/19/2022    Lab Results  Component Value Date   INR 3.7 07/20/2022   INR 1.9 06/18/2022   INR 1.8 (H) 05/19/2022    Assessment & Plan:  1. Chronic deep vein thrombosis (DVT) of femoral vein of left lower extremity (HCC) Supratherapeutic INR 3.7 Hold Coumadin today then resume 10 mg on Tuesday, Wednesday, Thursday, Friday and 5 mg on Saturday and Sunday I will double book him in 1  week to have his INR rechecked Strongly encouraged to keep his appointments with the clinical pharmacist for his INR checks as when he reschedules available appointments are far out. - POCT INR  2. Diabetes mellitus type 2 in obese (HCC) Uncontrolled with A1c of 8.1 He has an appointment for chronic disease management next month. - Glucose (CBG)    No orders of the defined types were placed in this encounter.   Follow-up: Return in about 1 week (around 07/27/2022) for INR check.  Double book at 8:30 AM with me.Charlott Rakes, MD, FAAFP. [facility] and [place], [place]   07/20/2022, 9:40 AM

## 2022-07-20 NOTE — Patient Instructions (Addendum)
Skip Coumadin today, take 10mg  on Tuesday, Wednesday, Thursday and Friday of the, take 5mg  on Saturday and Sunday. Keep your appointment on Monday, 06/26/2022 for repeat INR check.

## 2022-07-23 DIAGNOSIS — I2699 Other pulmonary embolism without acute cor pulmonale: Secondary | ICD-10-CM | POA: Diagnosis not present

## 2022-07-23 DIAGNOSIS — J189 Pneumonia, unspecified organism: Secondary | ICD-10-CM | POA: Diagnosis not present

## 2022-07-23 DIAGNOSIS — G4733 Obstructive sleep apnea (adult) (pediatric): Secondary | ICD-10-CM | POA: Diagnosis not present

## 2022-07-27 ENCOUNTER — Ambulatory Visit: Payer: BC Managed Care – PPO | Attending: Family Medicine | Admitting: Family Medicine

## 2022-07-27 ENCOUNTER — Other Ambulatory Visit: Payer: Self-pay

## 2022-07-27 ENCOUNTER — Other Ambulatory Visit: Payer: Self-pay | Admitting: Family Medicine

## 2022-07-27 ENCOUNTER — Encounter: Payer: Self-pay | Admitting: Family Medicine

## 2022-07-27 VITALS — BP 143/75 | HR 81 | Wt 386.6 lb

## 2022-07-27 DIAGNOSIS — I82512 Chronic embolism and thrombosis of left femoral vein: Secondary | ICD-10-CM | POA: Diagnosis not present

## 2022-07-27 DIAGNOSIS — E1169 Type 2 diabetes mellitus with other specified complication: Secondary | ICD-10-CM | POA: Diagnosis not present

## 2022-07-27 DIAGNOSIS — E669 Obesity, unspecified: Secondary | ICD-10-CM

## 2022-07-27 LAB — POCT INR: INR: 2.5 (ref 2.0–3.0)

## 2022-07-27 MED ORDER — TIRZEPATIDE 10 MG/0.5ML ~~LOC~~ SOAJ
10.0000 mg | SUBCUTANEOUS | 0 refills | Status: DC
  Filled 2022-07-27: qty 6, 84d supply, fill #0

## 2022-07-27 MED ORDER — WARFARIN SODIUM 10 MG PO TABS: 10.0000 mg | ORAL_TABLET | Freq: Every day | ORAL | 1 refills | Status: DC

## 2022-07-27 MED ORDER — TIRZEPATIDE 12.5 MG/0.5ML ~~LOC~~ SOAJ
12.5000 mg | SUBCUTANEOUS | 6 refills | Status: DC
  Filled 2022-07-27: qty 6, 84d supply, fill #0
  Filled 2022-08-19: qty 2, 28d supply, fill #0
  Filled 2022-09-12: qty 2, 28d supply, fill #1
  Filled 2022-10-11: qty 2, 28d supply, fill #2
  Filled 2022-11-13: qty 2, 28d supply, fill #3
  Filled 2022-12-20: qty 2, 28d supply, fill #4

## 2022-07-27 MED ORDER — TIRZEPATIDE 12.5 MG/0.5ML ~~LOC~~ SOAJ
12.5000 mg | SUBCUTANEOUS | 6 refills | Status: DC
  Filled 2022-07-27: qty 6, 84d supply, fill #0

## 2022-07-27 MED ORDER — SEMAGLUTIDE (2 MG/DOSE) 8 MG/3ML ~~LOC~~ SOPN: 2.0000 mg | PEN_INJECTOR | SUBCUTANEOUS | 6 refills | Status: DC

## 2022-07-27 MED ORDER — TIRZEPATIDE 10 MG/0.5ML ~~LOC~~ SOAJ
10.0000 mg | SUBCUTANEOUS | 0 refills | Status: DC
  Filled 2022-07-27: qty 2, 28d supply, fill #0

## 2022-07-27 NOTE — Progress Notes (Signed)
Subjective:  Patient ID: William Moss, male    DOB: 1986/03/14  Age: 36 y.o. MRN: [id number]  CC: Coagulation Disorder   HPI William Moss is a 36 y.o. year old male with a history of  hypertension, type 2 diabetes mellitus (A1c 8.1) , morbid obesity, GERD hospitalized for COVID-19 pneumonia in 06/2019, bilateral pulmonary embolism in 05/2021 s/p TPA, chronic thromboembolic pulmonary hypertension.    Interval History: Presents today for monitoring of his INR and at his last visit INR was supratherapeutic at 3.7 and his Coumadin dose was adjusted skipping 1 day then to continue 10 mg daily and 5 mg on Saturday and Sunday.  INR is 2.5 today. Denies bleeding or bruising. He is requesting a prescription for Ozempic for his diabetes.  He was supposed to titrate up to 2 mg weekly but that dose was on backorder and he has remained on 1 mg weekly instead. Past Medical History:  Diagnosis Date   Atypical chest pain 05/27/2016   a. 05/2016: NST showing small area of moderare anteroapical ischemia b. 05/2016: cath showing tortous but normal cors which confirmed a false positive NST.   Diabetes mellitus without complication ([place])    GERD (gastroesophageal reflux disease) 10/02/2016   Hypertension    Pulmonary embolism, bilateral (HCC)     Past Surgical History:  Procedure Laterality Date   CARDIAC CATHETERIZATION N/A 06/10/2016   Procedure: Left Heart Cath and Coronary Angiography;  Surgeon: Wellington Hampshire, MD;  Location: [facility];  Service: Cardiovascular;  Laterality: N/A;   COLONOSCOPY N/A 02/04/2016   Procedure: COLONOSCOPY;  Surgeon: Irene Shipper, MD;  Location: [facility];  Service: Endoscopy;  Laterality: N/A;   NO PAST SURGERIES      Family History  Problem Relation Age of Onset   Diabetes Mother    Diabetes Father    Pulmonary embolism Father    Cancer Maternal Grandmother    Alzheimer's disease Paternal Grandmother    Breast cancer Cousin     Social History    Socioeconomic History   Marital status: Single    Spouse name: Not on file   Number of children: 0   Years of education: Not on file   Highest education level: Not on file  Occupational History   Occupation: call center rep  Tobacco Use   Smoking status: Never   Smokeless tobacco: Never  Substance and Sexual Activity   Alcohol use: No   Drug use: No   Sexual activity: Not Currently  Other Topics Concern   Not on file  Social History Narrative   Not on file   Social Determinants of Health   Financial Resource Strain: Not on file  Food Insecurity: Not on file  Transportation Needs: Not on file  Physical Activity: Not on file  Stress: Not on file  Social Connections: Not on file    Allergies  Allergen Reactions   Lisinopril-Hydrochlorothiazide Other (See Comments)    "Interfered with the pH level in the serum"    Outpatient Medications Prior to Visit  Medication Sig Dispense Refill   amLODipine (NORVASC) 10 MG tablet Take 1 tablet (10 mg total) by mouth daily. 90 tablet 1   atorvastatin (LIPITOR) 40 MG tablet TAKE 1 TABLET BY MOUTH EVERY DAY 90 tablet 3   Blood Glucose Monitoring Suppl (ONETOUCH VERIO REFLECT) w/Device KIT Check blood sugar TID E11.69 1 kit 0   carvedilol (COREG) 25 MG tablet Take 1 tablet (25 mg total) by mouth  2 (two) times daily. 180 tablet 1   hydrALAZINE (APRESOLINE) 50 MG tablet Take 1 tablet (50 mg total) by mouth every 8 (eight) hours. 270 tablet 1   HYDROcodone-acetaminophen (NORCO) 5-325 MG tablet Take 1 tablet by mouth every 6 (six) hours as needed for moderate pain. 15 tablet 0   Insulin Pen Needle (BD PEN NEEDLE NANO U/F) 32G X 4 MM MISC 10 Units by Does not apply route daily. 100 each 3   Lancets (ONETOUCH DELICA PLUS [id number]) MISC CHECK BLOOD SUGAR 3 TIMES A DAY 100 each 2   metFORMIN (GLUCOPHAGE) 500 MG tablet Take 2 tablets (1,000 mg total) by mouth 2 (two) times daily with a meal. 360 tablet 1   ondansetron (ZOFRAN) 4 MG tablet Take  1 tablet (4 mg total) by mouth every 8 (eight) hours as needed for nausea or vomiting. 4 tablet 0   Semaglutide, 1 MG/DOSE, 4 MG/3ML SOPN Inject 1 mg as directed once a week. For 4 weeks then increase to 2 mg/week 3 mL 0   warfarin (COUMADIN) 10 MG tablet Take 1 tablet (10 mg total) by mouth daily. 30 tablet 0   No facility-administered medications prior to visit.     ROS Review of Systems  Constitutional:  Negative for activity change and appetite change.  HENT:  Negative for sinus pressure and sore throat.   Respiratory:  Negative for chest tightness, shortness of breath and wheezing.   Cardiovascular:  Negative for chest pain and palpitations.  Gastrointestinal:  Negative for abdominal distention, abdominal pain and constipation.  Genitourinary: Negative.   Musculoskeletal: Negative.   Psychiatric/Behavioral:  Negative for behavioral problems and dysphoric mood.     Objective:  BP (!) 143/75   Pulse 81   Wt (!) 386 lb 9.6 oz (175.4 kg)   SpO2 95%   BMI 57.09 kg/m      07/27/2022    8:51 AM 07/20/2022    8:46 AM 06/18/2022   10:12 AM  BP/Weight  Systolic BP 672 094 709  Diastolic BP 75 83 89  Wt. (Lbs) 386.6 387 396  BMI 57.09 kg/m2 57.15 kg/m2 58.48 kg/m2      Physical Exam Constitutional:      Appearance: He is well-developed.  Cardiovascular:     Rate and Rhythm: Normal rate.     Heart sounds: Normal heart sounds. No murmur heard. Pulmonary:     Effort: Pulmonary effort is normal.     Breath sounds: Normal breath sounds. No wheezing or rales.  Chest:     Chest wall: No tenderness.  Abdominal:     General: Bowel sounds are normal. There is no distension.     Palpations: Abdomen is soft. There is no mass.     Tenderness: There is no abdominal tenderness.  Musculoskeletal:        General: Normal range of motion.     Right lower leg: No edema.     Left lower leg: No edema.  Neurological:     Mental Status: He is alert and oriented to person, place, and  time.  Psychiatric:        Mood and Affect: Mood normal.        Latest Ref Rng & Units 06/17/2022    3:19 PM 05/19/2022    9:02 AM 12/26/2021    9:51 AM  CMP  Glucose 70 - 99 mg/dL 229  239  111   BUN 6 - 20 mg/dL _0 Creatinine  0.61 - 1.24 mg/dL 1.48  1.41  1.43   Sodium 135 - 145 mmol/L 139  137  139   Potassium 3.5 - 5.1 mmol/L 4.2  5.0  3.9   Chloride 98 - 111 mmol/L 110  104  108   CO2 22 - 32 mmol/L _0 Calcium 8.9 - 10.3 mg/dL 8.9  9.0  8.9   Total Protein 6.5 - 8.1 g/dL 7.5  7.1  7.4   Total Bilirubin 0.3 - 1.2 mg/dL <0.1  0.2  0.3   Alkaline Phos 38 - 126 U/L 65  77  53   AST 15 - 41 U/L _1 ALT 0 - 44 U/L _2 Lipid Panel     Component Value Date/Time   CHOL 131 05/19/2022 0902   TRIG 288 (H) 05/19/2022 0902   HDL 34 (L) 05/19/2022 0902   CHOLHDL 3.1 04/17/2021 1011   CHOLHDL 3.5 06/29/2019 0005   VLDL 17 06/29/2019 0005   LDLCALC 52 05/19/2022 0902    CBC    Component Value Date/Time   WBC 6.9 06/17/2022 1519   WBC 12.0 (H) 09/21/2021 1958   RBC 4.68 06/17/2022 1519   HGB 12.0 (L) 06/17/2022 1519   HCT 37.7 (L) 06/17/2022 1519   PLT 228 06/17/2022 1519   MCV 80.6 06/17/2022 1519   MCH 25.6 (L) 06/17/2022 1519   MCHC 31.8 06/17/2022 1519   RDW 16.5 (H) 06/17/2022 1519   LYMPHSABS 2.2 06/17/2022 1519   MONOABS 0.6 06/17/2022 1519   EOSABS 0.2 06/17/2022 1519   BASOSABS 0.0 06/17/2022 1519    Lab Results  Component Value Date   HGBA1C 8.1 (H) 05/19/2022    Lab Results  Component Value Date   INR 2.5 07/27/2022   INR 3.7 07/20/2022   INR 1.9 06/18/2022    Assessment & Plan:  1. Chronic deep vein thrombosis (DVT) of femoral vein of left lower extremity (HCC) Therapeutic INR of 2.5 Continue dose of 10 mg Monday through Friday and 5 mg on Saturday and Sunday Keep appointment with clinical pharmacist on 08/07/2022 for INR check - POCT INR - warfarin (COUMADIN) 10 MG tablet; Take 1 tablet (10 mg total) by  mouth daily.  Dispense: 30 tablet; Refill: 1  2. Diabetes mellitus type 2 in obese ([place]) Suboptimally controlled with A1c of 8.1; goal is less than 7.0 Uptitrate from 1 mg Ozempic to 2 mg Ozempic. Plan at next visit will be to switch to [facility] for additional weight loss benefit - Semaglutide, 2 MG/DOSE, 8 MG/3ML SOPN; Inject 2 mg as directed once a week.  Dispense: 3 mL; Refill: 6    Meds ordered this encounter  Medications   Semaglutide, 2 MG/DOSE, 8 MG/3ML SOPN    Sig: Inject 2 mg as directed once a week.    Dispense:  3 mL    Refill:  6    Dose increase   warfarin (COUMADIN) 10 MG tablet    Sig: Take 1 tablet (10 mg total) by mouth daily.    Dispense:  30 tablet    Refill:  1    Follow-up: Return for previously scheduled appointment.       Charlott Rakes, MD, FAAFP. [facility] and [facility] [place], [place]   07/27/2022, 9:32 AM

## 2022-07-27 NOTE — Patient Instructions (Signed)
Your INR is 2.5.  Continue with 10 mg of Coumadin Monday through Friday and 5 mg on Saturday and Sunday.

## 2022-07-28 ENCOUNTER — Other Ambulatory Visit: Payer: Self-pay

## 2022-08-07 ENCOUNTER — Ambulatory Visit: Payer: BC Managed Care – PPO | Attending: Family Medicine | Admitting: Pharmacist

## 2022-08-07 DIAGNOSIS — Z7901 Long term (current) use of anticoagulants: Secondary | ICD-10-CM | POA: Diagnosis not present

## 2022-08-07 LAB — POCT INR: INR: 2.4 (ref 2.0–3.0)

## 2022-08-18 ENCOUNTER — Ambulatory Visit: Payer: BC Managed Care – PPO | Admitting: Family Medicine

## 2022-08-19 ENCOUNTER — Other Ambulatory Visit: Payer: Self-pay

## 2022-08-20 ENCOUNTER — Other Ambulatory Visit: Payer: Self-pay

## 2022-08-22 DIAGNOSIS — G4733 Obstructive sleep apnea (adult) (pediatric): Secondary | ICD-10-CM | POA: Diagnosis not present

## 2022-08-22 DIAGNOSIS — J189 Pneumonia, unspecified organism: Secondary | ICD-10-CM | POA: Diagnosis not present

## 2022-08-22 DIAGNOSIS — I2699 Other pulmonary embolism without acute cor pulmonale: Secondary | ICD-10-CM | POA: Diagnosis not present

## 2022-08-25 ENCOUNTER — Encounter: Payer: Self-pay | Admitting: Nurse Practitioner

## 2022-08-25 ENCOUNTER — Ambulatory Visit: Payer: BC Managed Care – PPO | Attending: Family Medicine | Admitting: Nurse Practitioner

## 2022-08-25 VITALS — BP 138/80 | HR 79 | Ht 69.0 in | Wt 379.0 lb

## 2022-08-25 DIAGNOSIS — E1169 Type 2 diabetes mellitus with other specified complication: Secondary | ICD-10-CM

## 2022-08-25 DIAGNOSIS — E1122 Type 2 diabetes mellitus with diabetic chronic kidney disease: Secondary | ICD-10-CM

## 2022-08-25 DIAGNOSIS — Z794 Long term (current) use of insulin: Secondary | ICD-10-CM

## 2022-08-25 DIAGNOSIS — I152 Hypertension secondary to endocrine disorders: Secondary | ICD-10-CM | POA: Diagnosis not present

## 2022-08-25 DIAGNOSIS — N182 Chronic kidney disease, stage 2 (mild): Secondary | ICD-10-CM

## 2022-08-25 DIAGNOSIS — E1159 Type 2 diabetes mellitus with other circulatory complications: Secondary | ICD-10-CM

## 2022-08-25 DIAGNOSIS — E669 Obesity, unspecified: Secondary | ICD-10-CM

## 2022-08-25 MED ORDER — AMLODIPINE BESYLATE 10 MG PO TABS: 10.0000 mg | ORAL_TABLET | Freq: Every day | ORAL | 1 refills | Status: DC

## 2022-08-25 MED ORDER — METFORMIN HCL 500 MG PO TABS: 1000.0000 mg | ORAL_TABLET | Freq: Two times a day (BID) | ORAL | 1 refills | Status: DC

## 2022-08-25 MED ORDER — CARVEDILOL 25 MG PO TABS: 25.0000 mg | ORAL_TABLET | Freq: Two times a day (BID) | ORAL | 1 refills | Status: DC

## 2022-08-25 MED ORDER — HYDRALAZINE HCL 50 MG PO TABS: 50.0000 mg | ORAL_TABLET | Freq: Three times a day (TID) | ORAL | 1 refills | Status: DC

## 2022-08-25 NOTE — Progress Notes (Signed)
Assessment & Plan:  William Moss was seen today for hypertension.  Diagnoses and all orders for this visit:  Hypertension associated with diabetes ([place]) -     CMP14+EGFR; Future -     Hemoglobin A1c; Future -     hydrALAZINE (APRESOLINE) 50 MG tablet; Take 1 tablet (50 mg total) by mouth every 8 (eight) hours. -     carvedilol (COREG) 25 MG tablet; Take 1 tablet (25 mg total) by mouth 2 (two) times daily. -     amLODipine (NORVASC) 10 MG tablet; Take 1 tablet (10 mg total) by mouth daily. Continue all antihypertensives as prescribed.  Reminded to bring in blood pressure log for follow  up appointment.  RECOMMENDATIONS: DASH/Mediterranean Diets are healthier choices for HTN.      Patient has been counseled on age-appropriate routine health concerns for screening and prevention. These are reviewed and up-to-date. Referrals have been placed accordingly. Immunizations are up-to-date or declined.    Subjective:   Chief Complaint  Patient presents with   Hypertension   HPI William Moss 36 y.o. male presents to office today for follow up to HTN  He has a past medical history of Atypical chest pain (05/27/2016), Diabetes mellitus without complication ([place]), GERD (10/02/2016), Hypertension, and Pulmonary embolism, bilateral ([place]).   HTN Blood pressure improved from last month but not quite at goal of <130/80. He states he has not taken his blood pressure medication yet this morning. Does not monitor his blood pressure at home. Taking amlodipine 10 mg daily, carvedilol 25 mg BID, hydralazine 50 mg TID as prescribed.  BP Readings from Last 3 Encounters:  08/25/22 138/80  07/27/22 (!) 143/75  07/20/22 120/83     Review of Systems  Constitutional:  Negative for fever, malaise/fatigue and weight loss.  HENT: Negative.  Negative for nosebleeds.   Eyes: Negative.  Negative for blurred vision, double vision and photophobia.  Respiratory: Negative.  Negative for cough and shortness of breath.    Cardiovascular: Negative.  Negative for chest pain, palpitations and leg swelling.  Gastrointestinal: Negative.  Negative for heartburn, nausea and vomiting.  Musculoskeletal: Negative.  Negative for myalgias.  Neurological: Negative.  Negative for dizziness, focal weakness, seizures and headaches.  Psychiatric/Behavioral: Negative.  Negative for suicidal ideas.     Past Medical History:  Diagnosis Date   Atypical chest pain 05/27/2016   a. 05/2016: NST showing small area of moderare anteroapical ischemia b. 05/2016: cath showing tortous but normal cors which confirmed a false positive NST.   Diabetes mellitus without complication ([place])    GERD (gastroesophageal reflux disease) 10/02/2016   Hypertension    Pulmonary embolism, bilateral ([facility])     Past Surgical History:  Procedure Laterality Date   CARDIAC CATHETERIZATION N/A 06/10/2016   Procedure: Left Heart Cath and Coronary Angiography;  Surgeon: Wellington Hampshire, MD;  Location: [place] [place] [facility];  Service: Cardiovascular;  Laterality: N/A;   COLONOSCOPY N/A 02/04/2016   Procedure: COLONOSCOPY;  Surgeon: Irene Shipper, MD;  Location: [facility];  Service: Endoscopy;  Laterality: N/A;   NO PAST SURGERIES      Family History  Problem Relation Age of Onset   Diabetes Mother    Diabetes Father    Pulmonary embolism Father    Cancer Maternal Grandmother    Alzheimer's disease Paternal Grandmother    Breast cancer Cousin     Social History Reviewed with no changes to be made today.   Outpatient Medications Prior to Visit  Medication  Sig Dispense Refill   atorvastatin (LIPITOR) 40 MG tablet TAKE 1 TABLET BY MOUTH EVERY DAY 90 tablet 3   Blood Glucose Monitoring Suppl (ONETOUCH VERIO REFLECT) w/Device KIT Check blood sugar TID E11.69 1 kit 0   HYDROcodone-acetaminophen (NORCO) 5-325 MG tablet Take 1 tablet by mouth every 6 (six) hours as needed for moderate pain. 15 tablet 0   Insulin Pen Needle (BD PEN NEEDLE NANO U/F) 32G X 4  MM MISC 10 Units by Does not apply route daily. 100 each 3   Lancets (ONETOUCH DELICA PLUS [id number]) MISC CHECK BLOOD SUGAR 3 TIMES A DAY 100 each 2   ondansetron (ZOFRAN) 4 MG tablet Take 1 tablet (4 mg total) by mouth every 8 (eight) hours as needed for nausea or vomiting. 4 tablet 0   tirzepatide (MOUNJARO) 12.5 MG/0.5ML Pen Inject 12.5 mg into the skin once a week. 6 mL 6   warfarin (COUMADIN) 10 MG tablet Take 1 tablet (10 mg total) by mouth daily. 30 tablet 1   amLODipine (NORVASC) 10 MG tablet Take 1 tablet (10 mg total) by mouth daily. 90 tablet 1   carvedilol (COREG) 25 MG tablet Take 1 tablet (25 mg total) by mouth 2 (two) times daily. 180 tablet 1   hydrALAZINE (APRESOLINE) 50 MG tablet Take 1 tablet (50 mg total) by mouth every 8 (eight) hours. 270 tablet 1   metFORMIN (GLUCOPHAGE) 500 MG tablet Take 2 tablets (1,000 mg total) by mouth 2 (two) times daily with a meal. 360 tablet 1   tirzepatide (MOUNJARO) 10 MG/0.5ML Pen Inject 10 mg into the skin once a week for 4 weeks then increase to 12.5 mg once a week thereafter 6 mL 0   No facility-administered medications prior to visit.    Allergies  Allergen Reactions   Lisinopril-Hydrochlorothiazide Other (See Comments)    "Interfered with the pH level in the serum"  Acidosis type reaction       Objective:    BP 138/80   Pulse 79   Ht _0  (1.753 m)   Wt (!) 379 lb (171.9 kg)   SpO2 95%   BMI 55.97 kg/m  Wt Readings from Last 3 Encounters:  08/25/22 (!) 379 lb (171.9 kg)  07/27/22 (!) 386 lb 9.6 oz (175.4 kg)  07/20/22 (!) 387 lb (175.5 kg)    Physical Exam Vitals and nursing note reviewed.  Constitutional:      Appearance: He is well-developed.  HENT:     Head: Normocephalic and atraumatic.  Cardiovascular:     Rate and Rhythm: Normal rate and regular rhythm.     Heart sounds: Normal heart sounds. No murmur heard.    No friction rub. No gallop.  Pulmonary:     Effort: Pulmonary effort is normal. No tachypnea  or respiratory distress.     Breath sounds: Normal breath sounds. No decreased breath sounds, wheezing, rhonchi or rales.  Chest:     Chest wall: No tenderness.  Abdominal:     General: Bowel sounds are normal.     Palpations: Abdomen is soft.  Musculoskeletal:        General: Normal range of motion.     Cervical back: Normal range of motion.  Skin:    General: Skin is warm and dry.  Neurological:     Mental Status: He is alert and oriented to person, place, and time.     Coordination: Coordination normal.  Psychiatric:        Behavior: Behavior normal. Behavior  is cooperative.        Thought Content: Thought content normal.        Judgment: Judgment normal.          Patient has been counseled extensively about nutrition and exercise as well as the importance of adherence with medications and regular follow-up. The patient was given clear instructions to go to ER or return to medical center if symptoms don't improve, worsen or new problems develop. The patient verbalized understanding.   Follow-up: Return for see Dr. Margarita Rana around April 20-21st for A1c follow up. Gildardo Pounds, FNP-BC [facility] [place], [place]   08/25/2022, 9:28 AM

## 2022-09-08 DIAGNOSIS — R0609 Other forms of dyspnea: Secondary | ICD-10-CM | POA: Diagnosis not present

## 2022-09-08 DIAGNOSIS — I2724 Chronic thromboembolic pulmonary hypertension: Secondary | ICD-10-CM | POA: Diagnosis not present

## 2022-09-08 DIAGNOSIS — E872 Acidosis, unspecified: Secondary | ICD-10-CM | POA: Diagnosis not present

## 2022-09-17 ENCOUNTER — Other Ambulatory Visit: Payer: Self-pay

## 2022-09-18 ENCOUNTER — Ambulatory Visit: Payer: BC Managed Care – PPO | Attending: Family Medicine | Admitting: Pharmacist

## 2022-09-18 ENCOUNTER — Other Ambulatory Visit: Payer: Self-pay

## 2022-09-18 DIAGNOSIS — N182 Chronic kidney disease, stage 2 (mild): Secondary | ICD-10-CM | POA: Diagnosis not present

## 2022-09-18 DIAGNOSIS — I82512 Chronic embolism and thrombosis of left femoral vein: Secondary | ICD-10-CM | POA: Diagnosis not present

## 2022-09-18 DIAGNOSIS — E1159 Type 2 diabetes mellitus with other circulatory complications: Secondary | ICD-10-CM | POA: Diagnosis not present

## 2022-09-18 DIAGNOSIS — I152 Hypertension secondary to endocrine disorders: Secondary | ICD-10-CM

## 2022-09-18 DIAGNOSIS — E1122 Type 2 diabetes mellitus with diabetic chronic kidney disease: Secondary | ICD-10-CM | POA: Diagnosis not present

## 2022-09-18 DIAGNOSIS — Z794 Long term (current) use of insulin: Secondary | ICD-10-CM | POA: Diagnosis not present

## 2022-09-18 LAB — POCT INR: POC INR: 3.2

## 2022-09-19 LAB — CMP14+EGFR
ALT: 18 IU/L (ref 0–44)
AST: 13 IU/L (ref 0–40)
Albumin/Globulin Ratio: 1.2 (ref 1.2–2.2)
Albumin: 3.7 g/dL — ABNORMAL LOW (ref 4.1–5.1)
Alkaline Phosphatase: 72 IU/L (ref 44–121)
BUN/Creatinine Ratio: 11 (ref 9–20)
BUN: 14 mg/dL (ref 6–20)
Bilirubin Total: 0.2 mg/dL (ref 0.0–1.2)
CO2: 20 mmol/L (ref 20–29)
Calcium: 9 mg/dL (ref 8.7–10.2)
Chloride: 107 mmol/L — ABNORMAL HIGH (ref 96–106)
Creatinine, Ser: 1.33 mg/dL — ABNORMAL HIGH (ref 0.76–1.27)
Globulin, Total: 3.2 g/dL (ref 1.5–4.5)
Glucose: 99 mg/dL (ref 70–99)
Potassium: 4.4 mmol/L (ref 3.5–5.2)
Sodium: 140 mmol/L (ref 134–144)
Total Protein: 6.9 g/dL (ref 6.0–8.5)
eGFR: 70 mL/min/{1.73_m2} (ref >59)

## 2022-09-19 LAB — HEMOGLOBIN A1C
Est. average glucose Bld gHb Est-mCnc: 146 mg/dL
Hgb A1c MFr Bld: 6.7 % — ABNORMAL HIGH (ref 4.8–5.6)

## 2022-10-05 ENCOUNTER — Encounter: Payer: Self-pay | Admitting: Family Medicine

## 2022-10-06 ENCOUNTER — Encounter: Payer: Self-pay | Admitting: Internal Medicine

## 2022-10-06 ENCOUNTER — Other Ambulatory Visit: Payer: Self-pay | Admitting: Family Medicine

## 2022-10-06 DIAGNOSIS — K921 Melena: Secondary | ICD-10-CM

## 2022-10-07 ENCOUNTER — Ambulatory Visit: Payer: BC Managed Care – PPO | Admitting: Nurse Practitioner

## 2022-10-12 ENCOUNTER — Other Ambulatory Visit: Payer: Self-pay

## 2022-10-13 ENCOUNTER — Encounter: Payer: Self-pay | Admitting: Family Medicine

## 2022-10-13 ENCOUNTER — Other Ambulatory Visit: Payer: Self-pay

## 2022-10-14 ENCOUNTER — Other Ambulatory Visit: Payer: Self-pay

## 2022-10-15 ENCOUNTER — Other Ambulatory Visit: Payer: Self-pay | Admitting: Pharmacist

## 2022-10-15 ENCOUNTER — Other Ambulatory Visit: Payer: Self-pay

## 2022-10-15 MED ORDER — TIRZEPATIDE 10 MG/0.5ML ~~LOC~~ SOAJ
10.0000 mg | SUBCUTANEOUS | 1 refills | Status: DC
  Filled 2022-10-15: qty 2, 28d supply, fill #0

## 2022-10-16 ENCOUNTER — Other Ambulatory Visit: Payer: Self-pay

## 2022-10-26 ENCOUNTER — Other Ambulatory Visit: Payer: Self-pay

## 2022-10-29 ENCOUNTER — Other Ambulatory Visit: Payer: Self-pay

## 2022-10-29 ENCOUNTER — Ambulatory Visit: Payer: BC Managed Care – PPO | Attending: Family Medicine | Admitting: Pharmacist

## 2022-10-29 DIAGNOSIS — I82512 Chronic embolism and thrombosis of left femoral vein: Secondary | ICD-10-CM

## 2022-10-29 DIAGNOSIS — I2724 Chronic thromboembolic pulmonary hypertension: Secondary | ICD-10-CM | POA: Diagnosis not present

## 2022-10-29 DIAGNOSIS — K921 Melena: Secondary | ICD-10-CM

## 2022-10-29 LAB — POCT INR: POC INR: 2.3

## 2022-10-30 LAB — CBC WITH DIFFERENTIAL/PLATELET
Basophils Absolute: 0 10*3/uL (ref 0.0–0.2)
Basos: 0 %
EOS (ABSOLUTE): 0.3 10*3/uL (ref 0.0–0.4)
Eos: 5 %
Hematocrit: 34.6 % — ABNORMAL LOW (ref 37.5–51.0)
Hemoglobin: 11.1 g/dL — ABNORMAL LOW (ref 13.0–17.7)
Immature Grans (Abs): 0 10*3/uL (ref 0.0–0.1)
Immature Granulocytes: 0 %
Lymphocytes Absolute: 1.8 10*3/uL (ref 0.7–3.1)
Lymphs: 27 %
MCH: 25.3 pg — ABNORMAL LOW (ref 26.6–33.0)
MCHC: 32.1 g/dL (ref 31.5–35.7)
MCV: 79 fL (ref 79–97)
Monocytes Absolute: 0.7 10*3/uL (ref 0.1–0.9)
Monocytes: 11 %
Neutrophils Absolute: 3.7 10*3/uL (ref 1.4–7.0)
Neutrophils: 57 %
Platelets: 278 10*3/uL (ref 150–450)
RBC: 4.38 x10E6/uL (ref 4.14–5.80)
RDW: 15.8 % — ABNORMAL HIGH (ref 11.6–15.4)
WBC: 6.5 10*3/uL (ref 3.4–10.8)

## 2022-11-10 ENCOUNTER — Encounter: Payer: Self-pay | Admitting: Internal Medicine

## 2022-11-10 ENCOUNTER — Ambulatory Visit: Payer: BC Managed Care – PPO | Admitting: Internal Medicine

## 2022-11-10 VITALS — BP 132/78 | HR 84 | Ht 69.0 in | Wt 370.0 lb

## 2022-11-10 DIAGNOSIS — K648 Other hemorrhoids: Secondary | ICD-10-CM | POA: Diagnosis not present

## 2022-11-10 DIAGNOSIS — K625 Hemorrhage of anus and rectum: Secondary | ICD-10-CM | POA: Diagnosis not present

## 2022-11-10 MED ORDER — HYDROCORTISONE ACETATE 25 MG RE SUPP: 25.0000 mg | Freq: Every day | RECTAL | 3 refills | Status: AC

## 2022-11-10 NOTE — Progress Notes (Signed)
HISTORY OF PRESENT ILLNESS:  William Moss is a 37 y.o. male, employed at [employer], he presents today with chief complaint of rectal bleeding.  The patient was seen in the office regarding the same in April 2017.  See that dictation for details.  He subsequently underwent complete colonoscopy February 04, 2016.  Entire colon was normal.  He was found to have internal hemorrhoids.  Patient reports that over the past 3 months he has noticed painless hematochezia intermittently with bowel movements.  No rectal pain.  No problems recently.  Similar to issues in the past when he had to undergo colonoscopy.  GI review of systems is otherwise negative.  He is on [facility] for diabetes.  He has had some weight loss with which he is pleased.  REVIEW OF SYSTEMS:  All non-GI ROS negative.  Past Medical History:  Diagnosis Date   Atypical chest pain 05/27/2016   a. 05/2016: NST showing small area of moderare anteroapical ischemia b. 05/2016: cath showing tortous but normal cors which confirmed a false positive NST.   Diabetes mellitus without complication ([place])    GERD (gastroesophageal reflux disease) 10/02/2016   Hypertension    Pulmonary embolism, bilateral ([facility])     Past Surgical History:  Procedure Laterality Date   CARDIAC CATHETERIZATION N/A 06/10/2016   Procedure: Left Heart Cath and Coronary Angiography;  Surgeon: Wellington Hampshire, MD;  Location: [facility] [facility];  Service: Cardiovascular;  Laterality: N/A;   COLONOSCOPY N/A 02/04/2016   Procedure: COLONOSCOPY;  Surgeon: Irene Shipper, MD;  [facility];  Service: Endoscopy;  Laterality: N/A;   NO PAST SURGERIES      Social History BRANDDON HALLIWELL  reports that he has never smoked. He has never used smokeless tobacco. He reports that he does not drink alcohol and does not use drugs.  family history includes Alzheimer's disease in his paternal grandmother; Breast cancer in his cousin; Cancer in his maternal grandmother; Diabetes  in his father and mother; Pulmonary embolism in his father.  Allergies  Allergen Reactions   Lisinopril-Hydrochlorothiazide Other (See Comments)    "Interfered with the pH level in the serum"  Acidosis type reaction       PHYSICAL EXAMINATION: Vital signs: BP 132/78   Pulse 84   Ht '5\' 9"'$  (1.753 m)   Wt (!) 370 lb (167.8 kg)   BMI 54.64 kg/m   Constitutional: generally well-appearing, no acute distress Psychiatric: alert and oriented x3, cooperative Eyes: extraocular movements intact, anicteric, conjunctiva pink Mouth: oral pharynx moist, no lesions Neck: supple no lymphadenopathy Cardiovascular: heart regular rate and rhythm, no murmur Lungs: clear to auscultation bilaterally Abdomen: soft, obese, nontender, nondistended, no obvious ascites, no peritoneal signs, normal bowel sounds, no organomegaly Rectal: No external abnormalities.  No mass or tenderness.  Hemoccult negative stool.  Moderate internal hemorrhoids noted Extremities: no lower extremity edema bilaterally Skin: no lesions on visible extremities Neuro: No focal deficits.  Cranial nerves intact  ASSESSMENT:  1.  Moderate mid rectal bleeding due to internal hemorrhoids 2.  Normal colonoscopy 2017 when being evaluated for the same.  Noted to have internal hemorrhoids 3.  Morbid obesity multiple medical problems   PLAN:  1.  2 tablespoons of Citrucel daily to improve bowel consistency. 2.  Prescribe Anusol HC suppositories.  1 at night for 2 weeks, then use on demand 3.  Will be due for routine follow-up screening colonoscopy at age 65.  Contact the office in the interim for  persistent or worsening problems. Total of 45 minutes was spent preparing to see the patient, reviewing data, obtaining comprehensive history, performing medically appropriate physical examination, counseling and educating the patient regarding the above listed issues, ordering medication, directed supplement therapies, and documenting clinical  information in the health record

## 2022-11-10 NOTE — Patient Instructions (Signed)
_______________________________________________________  If your blood pressure at your visit was 140/90 or greater, please contact your primary care physician to follow up on this.  _______________________________________________________  If you are age 39 or older, your body mass index should be between 23-30. Your Body mass index is 54.64 kg/m. If this is out of the aforementioned range listed, please consider follow up with your Primary Care Provider.  If you are age 67 or younger, your body mass index should be between 19-25. Your Body mass index is 54.64 kg/m. If this is out of the aformentioned range listed, please consider follow up with your Primary Care Provider.   ________________________________________________________  The [facility] providers would like to encourage you to use [facility] to communicate with providers for non-urgent requests or questions.  Due to long hold times on the telephone, sending your provider a message by [facility] may be a faster and more efficient way to get a response.  Please allow 48 business hours for a response.  Please remember that this is for non-urgent requests.  _______________________________________________________  We have sent the following medications to your pharmacy for you to pick up at your convenience: Anusol Suppository daily at bedtime   A high fiber diet with plenty of fluids (up to 8 glasses of water daily) is suggested to relieve these symptoms.  Citrucel 2 tablespoon once or twice daily can be used to keep bowels regular if needed.

## 2022-11-16 ENCOUNTER — Other Ambulatory Visit: Payer: Self-pay

## 2022-11-23 ENCOUNTER — Encounter: Payer: Self-pay | Admitting: Family Medicine

## 2022-11-23 ENCOUNTER — Other Ambulatory Visit: Payer: Self-pay | Admitting: Internal Medicine

## 2022-11-23 DIAGNOSIS — I82512 Chronic embolism and thrombosis of left femoral vein: Secondary | ICD-10-CM

## 2022-11-23 MED ORDER — WARFARIN SODIUM 10 MG PO TABS: ORAL_TABLET | ORAL | 0 refills | Status: DC

## 2022-11-24 ENCOUNTER — Other Ambulatory Visit: Payer: Self-pay | Admitting: Internal Medicine

## 2022-11-24 DIAGNOSIS — I82512 Chronic embolism and thrombosis of left femoral vein: Secondary | ICD-10-CM

## 2022-11-25 MED ORDER — WARFARIN SODIUM 10 MG PO TABS: ORAL_TABLET | ORAL | 0 refills | Status: DC

## 2022-12-10 ENCOUNTER — Ambulatory Visit: Payer: BC Managed Care – PPO | Attending: Family Medicine | Admitting: Pharmacist

## 2022-12-10 DIAGNOSIS — I82512 Chronic embolism and thrombosis of left femoral vein: Secondary | ICD-10-CM | POA: Diagnosis not present

## 2022-12-10 LAB — POCT INR: POC INR: 2.4

## 2022-12-16 ENCOUNTER — Other Ambulatory Visit: Payer: Self-pay | Admitting: Hematology and Oncology

## 2022-12-16 DIAGNOSIS — I2699 Other pulmonary embolism without acute cor pulmonale: Secondary | ICD-10-CM

## 2022-12-16 NOTE — Progress Notes (Unsigned)
[facility] [phone]   [phone]  PROGRESS NOTE  Patient Care Team: Hoy Register, MD as PCP - General (Family Medicine) Chilton Si, MD as PCP - Cardiology (Cardiology)   CHIEF COMPLAINTS/PURPOSE OF CONSULTATION:  History of bilateral pulmonary emboli and left lower extremity DVT  HISTORY OF PRESENTING ILLNESS:  William Moss 37 y.o. male returns for a follow up for history of bilateral pulmonary emboli and left lower extremity DVT. He continues on anticoagulation therapy with coumadin.   William Moss reports he has been well overall in the interim since her last visit.  He continues to take his Coumadin faithfully and is typically within therapeutic range.  He reports he has not been having any bleeding, bruising, or dark stools.  There is no blood in the urine or stool.  He notes that he checks his Coumadin levels once per month.  He does not have any signs or symptoms concerning for recurrent VTE such as leg pain, leg swelling, or chest pain/shortness of breath.  His energy levels are good and his appetite is strong.  He does eat red meat but is been cutting back and trying to only eat it about 2-3 times per week.  He is not having any lightheadedness, dizziness, or shortness of breath.  His energy about a 7 or 8 out of 10.  He is having some pain in his right foot particular when he wakes up in the morning and improves throughout the day.  Otherwise he is at his baseline level of health. He denies fevers, chills, night sweats, chest pain, cough, lower extremity edema, neuropathy or other skin changes.  He has no other complaints.  Rest of the 10 point ROS is below.  MEDICAL HISTORY:  Past Medical History:  Diagnosis Date   Atypical chest pain 05/27/2016   a. 05/2016: NST showing small area of moderare anteroapical ischemia b. 05/2016: cath showing tortous but normal cors which confirmed a false positive NST.   Diabetes mellitus without  complication    GERD (gastroesophageal reflux disease) 10/02/2016   Hypertension    Pulmonary embolism, bilateral     SURGICAL HISTORY: Past Surgical History:  Procedure Laterality Date   CARDIAC CATHETERIZATION N/A 06/10/2016   Procedure: Left Heart Cath and Coronary Angiography;  Surgeon: Iran Ouch, MD;  [facility];  Service: Cardiovascular;  Laterality: N/A;   COLONOSCOPY N/A 02/04/2016   Procedure: COLONOSCOPY;  Surgeon: Hilarie Fredrickson, MD;  Location: [facility];  Service: Endoscopy;  Laterality: N/A;   NO PAST SURGERIES      SOCIAL HISTORY: Social History   Socioeconomic History   Marital status: Single    Spouse name: Not on file   Number of children: 0   Years of education: Not on file   Highest education level: Some college, no degree  Occupational History   Occupation: call center rep   Occupation: call center  Tobacco Use   Smoking status: Never   Smokeless tobacco: Never  Vaping Use   Vaping Use: Not on file  Substance and Sexual Activity   Alcohol use: No   Drug use: No   Sexual activity: Not Currently  Other Topics Concern   Not on file  Social History Narrative   Not on file   Social Determinants of Health   Financial Resource Strain: Low Risk  (12/08/2022)   Overall Financial Resource Strain (CARDIA)    Difficulty of Paying Living Expenses: Not very hard  Food  Insecurity: Food Insecurity Present (12/08/2022)   Hunger Vital Sign    Worried About Running Out of Food in the Last Year: Sometimes true    Ran Out of Food in the Last Year: Sometimes true  Transportation Needs: No Transportation Needs (12/08/2022)   PRAPARE - Administrator, Civil Service (Medical): No    Lack of Transportation (Non-Medical): No  Physical Activity: Insufficiently Active (12/08/2022)   Exercise Vital Sign    Days of Exercise per Week: 3 days    Minutes of Exercise per Session: 30 min  Stress: Stress Concern Present (12/08/2022)   [employer] of Occupational Health - Occupational Stress Questionnaire    Feeling of Stress : To some extent  Social Connections: Moderately Isolated (12/08/2022)   Social Connection and Isolation Panel [NHANES]    Frequency of Communication with Friends and Family: More than three times a week    Frequency of Social Gatherings with Friends and Family: Once a week    Attends Religious Services: More than 4 times per year    Active Member of [employer] or Organizations: No    Attends Engineer, structural: Not on file    Marital Status: Never married  Catering manager Violence: Not on file    FAMILY HISTORY: Family History  Problem Relation Age of Onset   Diabetes Mother    Diabetes Father    Pulmonary embolism Father    Cancer Maternal Grandmother    Alzheimer's disease Paternal Grandmother    Breast cancer Cousin    Colon cancer Neg Hx    Esophageal cancer Neg Hx    Stomach cancer Neg Hx     ALLERGIES:  is allergic to lisinopril-hydrochlorothiazide.  MEDICATIONS:  Current Outpatient Medications  Medication Sig Dispense Refill   tirzepatide (MOUNJARO) 10 MG/0.5ML Pen Inject 10 mg into the skin once a week. (Patient not taking: Reported on 11/10/2022) 6 mL 1   amLODipine (NORVASC) 10 MG tablet Take 1 tablet (10 mg total) by mouth daily. 90 tablet 1   atorvastatin (LIPITOR) 40 MG tablet TAKE 1 TABLET BY MOUTH EVERY DAY 90 tablet 3   Blood Glucose Monitoring Suppl (ONETOUCH VERIO REFLECT) w/Device KIT Check blood sugar TID E11.69 1 kit 0   carvedilol (COREG) 25 MG tablet Take 1 tablet (25 mg total) by mouth 2 (two) times daily. 180 tablet 1   hydrALAZINE (APRESOLINE) 50 MG tablet Take 1 tablet (50 mg total) by mouth every 8 (eight) hours. 270 tablet 1   HYDROcodone-acetaminophen (NORCO) 5-325 MG tablet Take 1 tablet by mouth every 6 (six) hours as needed for moderate pain. 15 tablet 0   hydrocortisone (ANUSOL-HC) 25 MG suppository Place 1 suppository (25 mg total) rectally at  bedtime. 30 suppository 3   Insulin Pen Needle (BD PEN NEEDLE NANO U/F) 32G X 4 MM MISC 10 Units by Does not apply route daily. 100 each 3   Lancets (ONETOUCH DELICA PLUS LANCET33G) MISC CHECK BLOOD SUGAR 3 TIMES A DAY 100 each 2   metFORMIN (GLUCOPHAGE) 500 MG tablet Take 2 tablets (1,000 mg total) by mouth 2 (two) times daily with a meal. 360 tablet 1   ondansetron (ZOFRAN) 4 MG tablet Take 1 tablet (4 mg total) by mouth every 8 (eight) hours as needed for nausea or vomiting. 4 tablet 0   tirzepatide (MOUNJARO) 12.5 MG/0.5ML Pen Inject 12.5 mg into the skin once a week. 6 mL 6   warfarin (COUMADIN) 10 MG tablet 10 mg Q Mon-Fri  and 1/2 tab Q Sat and Sun 30 tablet 0   No current facility-administered medications for this visit.    REVIEW OF SYSTEMS:   Constitutional: ( - ) fevers, ( - )  chills , ( - ) night sweats Eyes: ( - ) blurriness of vision, ( - ) double vision, ( - ) watery eyes Ears, nose, mouth, throat, and face: ( - ) mucositis, ( - ) sore throat Respiratory: ( - ) cough, ( + ) dyspnea, ( - ) wheezes Cardiovascular: ( - ) palpitation, ( - ) chest discomfort, ( - ) lower extremity swelling Gastrointestinal:  ( - ) nausea, ( - ) heartburn, ( - ) change in bowel habits Skin: ( - ) abnormal skin rashes Lymphatics: ( - ) new lymphadenopathy, ( - ) easy bruising Neurological: ( - ) numbness, ( - ) tingling, ( - ) new weaknesses Behavioral/Psych: ( - ) mood change, ( - ) new changes  All other systems were reviewed with the patient and are negative.  PHYSICAL EXAMINATION: ECOG PERFORMANCE STATUS: 1 - Symptomatic but completely ambulatory  Vitals:   12/17/22 0832  BP: (!) 149/85  Pulse: 82  Resp: 17  Temp: (!) 97.2 F (36.2 C)  SpO2: 99%    Filed Weights   12/17/22 0832  Weight: (!) 376 lb 3.2 oz (170.6 kg)     GENERAL: well appearing male in NAD, obese SKIN: skin color, texture, turgor are normal, no rashes or significant lesions EYES: conjunctiva are pink and  non-injected, sclera clear LUNGS: clear to auscultation and percussion with normal breathing effort HEART: regular rate & rhythm and no murmurs and no lower extremity edema Musculoskeletal: no cyanosis of digits and no clubbing  PSYCH: alert & oriented x 3, fluent speech NEURO: no focal motor/sensory deficits  LABORATORY DATA:  I have reviewed the data as listed    Latest Ref Rng & Units 12/17/2022    8:22 AM 10/29/2022    8:53 AM 06/17/2022    3:19 PM  CBC  WBC 4.0 - 10.5 K/uL 7.9  6.5  6.9   Hemoglobin 13.0 - 17.0 g/dL 16.1  09.6  04.5   Hematocrit 39.0 - 52.0 % 33.8  34.6  37.7   Platelets 150 - 400 K/uL 263  278  228        Latest Ref Rng & Units 12/17/2022    8:22 AM 09/18/2022    9:00 AM 06/17/2022    3:19 PM  CMP  Glucose 70 - 99 mg/dL 93  99  409   BUN 6 - 20 mg/dL Creatinine 0.61 - 1.24 mg/dL 8.11  9.14  7.82   Sodium 135 - 145 mmol/L 138  140  139   Potassium 3.5 - 5.1 mmol/L 4.3  4.4  4.2   Chloride 98 - 111 mmol/L 108  107  110   CO2 22 - 32 mmol/L Calcium 8.9 - 10.3 mg/dL 9.0  9.0  8.9   Total Protein 6.5 - 8.1 g/dL 7.4  6.9  7.5   Total Bilirubin 0.3 - 1.2 mg/dL 0.3  0.2  <9.5   Alkaline Phos 38 - 126 U/L 82  72  65   AST 15 - 41 U/L ALT 0 - 44 U/L ASSESSMENT & PLAN William Moss is a 37 y.o. male  who returns for a follow up for history of bilateral pulmonary emboli and left lower extremity DVT.   #Bilateral PE and left leg DVT: --Likely provoking factors include pneumonia and prolonged immobility due to sedentary lifestyle.  --Currently on coumadin, INR in therapeutic range.  --Hypercoagulable workup from November 2022 was unremarkable. --Patient continues to be sedentary and has evidence of residual pulmonary embolism seen on NM perfusion scan from February 2023. He is following with [facility] for consideration of CTEPH therapies --Recommend to continue on anticoagulation in the setting of continued  sedentary lifestyle, persistent dyspnea on exertion and residual PE.  --Labs today show white blood cell count 7.9, hemoglobin 10.7, MCV 79.7, and platelets of 263. Cr 1.53, LFTs WNL --RTC in 3 months with labs (normal q 6 months but returning for anemia)  #Microcytic Anemia, Worsening -- Hemoglobin dropped to 10.7 now with microcytosis. -- Today ordered iron panel, ferritin, vitamin B12, and folate to assess for etiology of this anemia. -- Plan for return to clinic in 3 months time assuming this is a nutritional deficiency that we will need to monitor.  #Plantar Fascitis -- Patient wakes up in the morning with pain in his right foot that improves throughout the day. -- Findings sound consistent with plantar fasciitis. -- Recommend conservative methods such as morning stretches with a tennis ball/baseball or water bottles.  #Cord-like nodularity involving R upper extremity-resolved: --PICC line was placed on R upper extremity during recent hospitalization --Doppler ultrasound of the right upper extremity from 08/13/2021 showed no evidence of DVT in the R upper extremity.   No orders of the defined types were placed in this encounter.   All questions were answered. The patient knows to call the clinic with any problems, questions or concerns.  I have spent a total of 30 minutes minutes of face-to-face and non-face-to-face time, preparing to see the patient, performing a medically appropriate examination, counseling and educating the patient, ordering tests, documenting clinical information in the electronic health record,  and care coordination.   William Barns, MD Department of Hematology/Oncology [facility] Phone: [phone] Pager: [phone] Email: [email or web address]

## 2022-12-17 ENCOUNTER — Inpatient Hospital Stay: Payer: BC Managed Care – PPO

## 2022-12-17 ENCOUNTER — Inpatient Hospital Stay: Payer: BC Managed Care – PPO | Attending: Hematology and Oncology | Admitting: Hematology and Oncology

## 2022-12-17 VITALS — BP 149/85 | HR 82 | Temp 97.2°F | Resp 17 | Wt 376.2 lb

## 2022-12-17 DIAGNOSIS — Z86718 Personal history of other venous thrombosis and embolism: Secondary | ICD-10-CM | POA: Insufficient documentation

## 2022-12-17 DIAGNOSIS — Z7901 Long term (current) use of anticoagulants: Secondary | ICD-10-CM | POA: Diagnosis not present

## 2022-12-17 DIAGNOSIS — I82512 Chronic embolism and thrombosis of left femoral vein: Secondary | ICD-10-CM

## 2022-12-17 DIAGNOSIS — E538 Deficiency of other specified B group vitamins: Secondary | ICD-10-CM | POA: Insufficient documentation

## 2022-12-17 DIAGNOSIS — E611 Iron deficiency: Secondary | ICD-10-CM | POA: Insufficient documentation

## 2022-12-17 DIAGNOSIS — D509 Iron deficiency anemia, unspecified: Secondary | ICD-10-CM | POA: Insufficient documentation

## 2022-12-17 DIAGNOSIS — I2699 Other pulmonary embolism without acute cor pulmonale: Secondary | ICD-10-CM | POA: Diagnosis not present

## 2022-12-17 DIAGNOSIS — Z803 Family history of malignant neoplasm of breast: Secondary | ICD-10-CM | POA: Insufficient documentation

## 2022-12-17 DIAGNOSIS — Z86711 Personal history of pulmonary embolism: Secondary | ICD-10-CM | POA: Diagnosis not present

## 2022-12-17 DIAGNOSIS — M722 Plantar fascial fibromatosis: Secondary | ICD-10-CM | POA: Insufficient documentation

## 2022-12-17 LAB — CMP (CANCER CENTER ONLY)
ALT: 13 U/L (ref 0–44)
AST: 10 U/L — ABNORMAL LOW (ref 15–41)
Albumin: 3.9 g/dL (ref 3.5–5.0)
Alkaline Phosphatase: 82 U/L (ref 38–126)
Anion gap: 6 (ref 5–15)
BUN: 23 mg/dL — ABNORMAL HIGH (ref 6–20)
CO2: 24 mmol/L (ref 22–32)
Calcium: 9 mg/dL (ref 8.9–10.3)
Chloride: 108 mmol/L (ref 98–111)
Creatinine: 1.53 mg/dL — ABNORMAL HIGH (ref 0.61–1.24)
GFR, Estimated: 59 mL/min — ABNORMAL LOW (ref >60)
Glucose, Bld: 93 mg/dL (ref 70–99)
Potassium: 4.3 mmol/L (ref 3.5–5.1)
Sodium: 138 mmol/L (ref 135–145)
Total Bilirubin: 0.3 mg/dL (ref 0.3–1.2)
Total Protein: 7.4 g/dL (ref 6.5–8.1)

## 2022-12-17 LAB — CBC WITH DIFFERENTIAL (CANCER CENTER ONLY)
Abs Immature Granulocytes: 0.03 10*3/uL (ref 0.00–0.07)
Basophils Absolute: 0 10*3/uL (ref 0.0–0.1)
Basophils Relative: 0 %
Eosinophils Absolute: 0.2 10*3/uL (ref 0.0–0.5)
Eosinophils Relative: 3 %
HCT: 33.8 % — ABNORMAL LOW (ref 39.0–52.0)
Hemoglobin: 10.7 g/dL — ABNORMAL LOW (ref 13.0–17.0)
Immature Granulocytes: 0 %
Lymphocytes Relative: 29 %
Lymphs Abs: 2.3 10*3/uL (ref 0.7–4.0)
MCH: 25.2 pg — ABNORMAL LOW (ref 26.0–34.0)
MCHC: 31.7 g/dL (ref 30.0–36.0)
MCV: 79.7 fL — ABNORMAL LOW (ref 80.0–100.0)
Monocytes Absolute: 0.7 10*3/uL (ref 0.1–1.0)
Monocytes Relative: 9 %
Neutro Abs: 4.6 10*3/uL (ref 1.7–7.7)
Neutrophils Relative %: 59 %
Platelet Count: 263 10*3/uL (ref 150–400)
RBC: 4.24 MIL/uL (ref 4.22–5.81)
RDW: 15.6 % — ABNORMAL HIGH (ref 11.5–15.5)
WBC Count: 7.9 10*3/uL (ref 4.0–10.5)
nRBC: 0 % (ref 0.0–0.2)

## 2022-12-17 LAB — RETIC PANEL
Immature Retic Fract: 31.7 % — ABNORMAL HIGH (ref 2.3–15.9)
RBC.: 4.24 MIL/uL (ref 4.22–5.81)
Retic Count, Absolute: 82.7 10*3/uL (ref 19.0–186.0)
Retic Ct Pct: 2 % (ref 0.4–3.1)
Reticulocyte Hemoglobin: 26.8 pg — ABNORMAL LOW (ref >27.9)

## 2022-12-17 LAB — FERRITIN: Ferritin: 4 ng/mL — ABNORMAL LOW (ref 24–336)

## 2022-12-17 LAB — FOLATE: Folate: 5.3 ng/mL — ABNORMAL LOW (ref >5.9)

## 2022-12-17 LAB — IRON AND IRON BINDING CAPACITY (CC-WL,HP ONLY)
Iron: 40 ug/dL — ABNORMAL LOW (ref 45–182)
Saturation Ratios: 11 % — ABNORMAL LOW (ref 17.9–39.5)
TIBC: 377 ug/dL (ref 250–450)
UIBC: 337 ug/dL (ref 117–376)

## 2022-12-17 LAB — VITAMIN B12: Vitamin B-12: 128 pg/mL — ABNORMAL LOW (ref 180–914)

## 2022-12-21 ENCOUNTER — Encounter: Payer: Self-pay | Admitting: Family Medicine

## 2022-12-21 ENCOUNTER — Other Ambulatory Visit: Payer: Self-pay

## 2022-12-21 ENCOUNTER — Ambulatory Visit: Payer: BC Managed Care – PPO | Attending: Family Medicine | Admitting: Family Medicine

## 2022-12-21 VITALS — BP 136/84 | HR 84 | Wt 368.6 lb

## 2022-12-21 DIAGNOSIS — E1122 Type 2 diabetes mellitus with diabetic chronic kidney disease: Secondary | ICD-10-CM

## 2022-12-21 DIAGNOSIS — D649 Anemia, unspecified: Secondary | ICD-10-CM | POA: Diagnosis not present

## 2022-12-21 DIAGNOSIS — I82512 Chronic embolism and thrombosis of left femoral vein: Secondary | ICD-10-CM

## 2022-12-21 DIAGNOSIS — Z794 Long term (current) use of insulin: Secondary | ICD-10-CM | POA: Diagnosis not present

## 2022-12-21 DIAGNOSIS — M722 Plantar fascial fibromatosis: Secondary | ICD-10-CM

## 2022-12-21 DIAGNOSIS — N182 Chronic kidney disease, stage 2 (mild): Secondary | ICD-10-CM | POA: Diagnosis not present

## 2022-12-21 DIAGNOSIS — E1169 Type 2 diabetes mellitus with other specified complication: Secondary | ICD-10-CM

## 2022-12-21 DIAGNOSIS — E1159 Type 2 diabetes mellitus with other circulatory complications: Secondary | ICD-10-CM | POA: Diagnosis not present

## 2022-12-21 DIAGNOSIS — I152 Hypertension secondary to endocrine disorders: Secondary | ICD-10-CM

## 2022-12-21 DIAGNOSIS — E669 Obesity, unspecified: Secondary | ICD-10-CM

## 2022-12-21 LAB — POCT GLYCOSYLATED HEMOGLOBIN (HGB A1C): HbA1c, POC (controlled diabetic range): 6 % (ref 0.0–7.0)

## 2022-12-21 LAB — GLUCOSE, POCT (MANUAL RESULT ENTRY): POC Glucose: 113 mg/dl — AB (ref 70–99)

## 2022-12-21 MED ORDER — ATORVASTATIN CALCIUM 40 MG PO TABS: 40.0000 mg | ORAL_TABLET | Freq: Every day | ORAL | 1 refills | Status: DC

## 2022-12-21 MED ORDER — HYDRALAZINE HCL 50 MG PO TABS: 50.0000 mg | ORAL_TABLET | Freq: Three times a day (TID) | ORAL | 1 refills | Status: DC

## 2022-12-21 MED ORDER — TIRZEPATIDE 12.5 MG/0.5ML ~~LOC~~ SOAJ: 12.5000 mg | SUBCUTANEOUS | 6 refills | Status: DC

## 2022-12-21 MED ORDER — METFORMIN HCL 500 MG PO TABS
1000.0000 mg | ORAL_TABLET | Freq: Two times a day (BID) | ORAL | 1 refills | Status: DC
Start: 2022-12-21 — End: 2023-07-05

## 2022-12-21 MED ORDER — WARFARIN SODIUM 10 MG PO TABS: ORAL_TABLET | ORAL | 1 refills | Status: DC

## 2022-12-21 MED ORDER — CARVEDILOL 25 MG PO TABS
25.0000 mg | ORAL_TABLET | Freq: Two times a day (BID) | ORAL | 1 refills | Status: DC
Start: 2022-12-21 — End: 2023-09-15

## 2022-12-21 MED ORDER — AMLODIPINE BESYLATE 10 MG PO TABS: 10.0000 mg | ORAL_TABLET | Freq: Every day | ORAL | 1 refills | Status: DC

## 2022-12-21 NOTE — Progress Notes (Signed)
Subjective:  Patient ID: William Moss, male    DOB: 10-Jan-1986  Age: 37 y.o. MRN: [id number]  CC: Diabetes and Foot Pain (Right/)   HPI William Moss is a 37 y.o. year old male with a history of hypertension, type 2 diabetes mellitus (A1c 6.0) , morbid obesity, GERD hospitalized for COVID-19 pneumonia in 06/2019, bilateral pulmonary embolism in 05/2021 s/p TPA, chronic thromboembolic pulmonary hypertension, chronic anemia.    Interval History:  He Complains of pain in his right heel worse in the morning when he wakes up and it eases up as the day progresses.  He has been trying to roll his right foot over a can on the floor and this has been somewhat helpful.  His diabetes has been controlled with no hypoglycemic episodes, no neuropathy or visual concerns.  He is doing well on his statin and his antihypertensive. He is due for a foot exam and urine microalbumin today.  He continues to follow-up with his specialists.  He did see hematology last month for follow-up of chronic thromboembolism and anemia and remains anemic with hemoglobin of 10.6.  He is not on iron supplements.  He denies presence of hematochezia.  Iron studies revealed low ferritin of 4, low folate, low vitamin B12.  His results have not been discussed with him by his hematologist. He also saw GI last month. He will be scheduling an appointment with his pulmonologist at [facility]. Past Medical History:  Diagnosis Date   Atypical chest pain 05/27/2016   a. 05/2016: NST showing small area of moderare anteroapical ischemia b. 05/2016: cath showing tortous but normal cors which confirmed a false positive NST.   Diabetes mellitus without complication    GERD (gastroesophageal reflux disease) 10/02/2016   Hypertension    Pulmonary embolism, bilateral     Past Surgical History:  Procedure Laterality Date   CARDIAC CATHETERIZATION N/A 06/10/2016   Procedure: Left Heart Cath and Coronary Angiography;  Surgeon: Iran Ouch,  MD;  Location: [facility];  Service: Cardiovascular;  Laterality: N/A;   COLONOSCOPY N/A 02/04/2016   Procedure: COLONOSCOPY;  Surgeon: Hilarie Fredrickson, MD;  Location: [facility] ENDOSCOPY;  Service: Endoscopy;  Laterality: N/A;   NO PAST SURGERIES      Family History  Problem Relation Age of Onset   Diabetes Mother    Diabetes Father    Pulmonary embolism Father    Cancer Maternal Grandmother    Alzheimer's disease Paternal Grandmother    Breast cancer Cousin    Colon cancer Neg Hx    Esophageal cancer Neg Hx    Stomach cancer Neg Hx     Social History   Socioeconomic History   Marital status: Single    Spouse name: Not on file   Number of children: 0   Years of education: Not on file   Highest education level: Some college, no degree  Occupational History   Occupation: call center rep   Occupation: call center  Tobacco Use   Smoking status: Never   Smokeless tobacco: Never  Vaping Use   Vaping Use: Not on file  Substance and Sexual Activity   Alcohol use: No   Drug use: No   Sexual activity: Not Currently  Other Topics Concern   Not on file  Social History Narrative   Not on file   Social Determinants of Health   Financial Resource Strain: Low Risk  (12/08/2022)   Overall Financial Resource Strain (CARDIA)    Difficulty of  Paying Living Expenses: Not very hard  Food Insecurity: Food Insecurity Present (12/08/2022)   Hunger Vital Sign    Worried About Running Out of Food in the Last Year: Sometimes true    Ran Out of Food in the Last Year: Sometimes true  Transportation Needs: No Transportation Needs (12/08/2022)   PRAPARE - Administrator, Civil Service (Medical): No    Lack of Transportation (Non-Medical): No  Physical Activity: Insufficiently Active (12/08/2022)   Exercise Vital Sign    Days of Exercise per Week: 3 days    Minutes of Exercise per Session: 30 min  Stress: Stress Concern Present (12/08/2022)   [employer] of Occupational Health -  Occupational Stress Questionnaire    Feeling of Stress : To some extent  Social Connections: Moderately Isolated (12/08/2022)   Social Connection and Isolation Panel [NHANES]    Frequency of Communication with Friends and Family: More than three times a week    Frequency of Social Gatherings with Friends and Family: Once a week    Attends Religious Services: More than 4 times per year    Active Member of [employer] or Organizations: No    Attends Engineer, structural: Not on file    Marital Status: Never married    Allergies  Allergen Reactions   Lisinopril-Hydrochlorothiazide Other (See Comments)    "Interfered with the pH level in the serum"  Acidosis type reaction    Outpatient Medications Prior to Visit  Medication Sig Dispense Refill   Blood Glucose Monitoring Suppl (ONETOUCH VERIO REFLECT) w/Device KIT Check blood sugar TID E11.69 1 kit 0   HYDROcodone-acetaminophen (NORCO) 5-325 MG tablet Take 1 tablet by mouth every 6 (six) hours as needed for moderate pain. 15 tablet 0   hydrocortisone (ANUSOL-HC) 25 MG suppository Place 1 suppository (25 mg total) rectally at bedtime. 30 suppository 3   Insulin Pen Needle (BD PEN NEEDLE NANO U/F) 32G X 4 MM MISC 10 Units by Does not apply route daily. 100 each 3   Lancets (ONETOUCH DELICA PLUS LANCET33G) MISC CHECK BLOOD SUGAR 3 TIMES A DAY 100 each 2   ondansetron (ZOFRAN) 4 MG tablet Take 1 tablet (4 mg total) by mouth every 8 (eight) hours as needed for nausea or vomiting. 4 tablet 0   amLODipine (NORVASC) 10 MG tablet Take 1 tablet (10 mg total) by mouth daily. 90 tablet 1   atorvastatin (LIPITOR) 40 MG tablet TAKE 1 TABLET BY MOUTH EVERY DAY 90 tablet 3   carvedilol (COREG) 25 MG tablet Take 1 tablet (25 mg total) by mouth 2 (two) times daily. 180 tablet 1   hydrALAZINE (APRESOLINE) 50 MG tablet Take 1 tablet (50 mg total) by mouth every 8 (eight) hours. 270 tablet 1   metFORMIN (GLUCOPHAGE) 500 MG tablet Take 2 tablets (1,000 mg  total) by mouth 2 (two) times daily with a meal. 360 tablet 1   tirzepatide (MOUNJARO) 12.5 MG/0.5ML Pen Inject 12.5 mg into the skin once a week. 6 mL 6   warfarin (COUMADIN) 10 MG tablet 10 mg Q Mon-Fri and 1/2 tab Q Sat and Sun 30 tablet 0   tirzepatide (MOUNJARO) 10 MG/0.5ML Pen Inject 10 mg into the skin once a week. (Patient not taking: Reported on 12/21/2022) 6 mL 1   No facility-administered medications prior to visit.     ROS Review of Systems  Constitutional:  Negative for activity change and appetite change.  HENT:  Negative for sinus pressure and sore throat.  Respiratory:  Negative for chest tightness, shortness of breath and wheezing.   Cardiovascular:  Negative for chest pain and palpitations.  Gastrointestinal:  Negative for abdominal distention, abdominal pain and constipation.  Genitourinary: Negative.   Musculoskeletal:        See HPI  Psychiatric/Behavioral:  Negative for behavioral problems and dysphoric mood.     Objective:  BP 136/84 (BP Location: Left Arm, Patient Position: Sitting, Cuff Size: Large)   Pulse 84   Wt (!) 368 lb 9.6 oz (167.2 kg)   SpO2 96%   BMI 54.43 kg/m      12/21/2022    8:44 AM 12/17/2022    8:32 AM 11/10/2022    9:50 AM  BP/Weight  Systolic BP 136 149 132  Diastolic BP 84 85 78  Wt. (Lbs) 368.6 376.2 370  BMI 54.43 kg/m2 55.56 kg/m2 54.64 kg/m2      Physical Exam Constitutional:      Appearance: He is well-developed.  Cardiovascular:     Rate and Rhythm: Normal rate.     Heart sounds: Normal heart sounds. No murmur heard. Pulmonary:     Effort: Pulmonary effort is normal.     Breath sounds: Normal breath sounds. No wheezing or rales.  Chest:     Chest wall: No tenderness.  Abdominal:     General: Bowel sounds are normal. There is no distension.     Palpations: Abdomen is soft. There is no mass.     Tenderness: There is no abdominal tenderness.  Musculoskeletal:        General: Normal range of motion.     Right  lower leg: No edema.     Left lower leg: No edema.     Comments: No heel tenderness on palpation or dorsiflexion  Neurological:     Mental Status: He is alert and oriented to person, place, and time.  Psychiatric:        Mood and Affect: Mood normal.        Latest Ref Rng & Units 12/17/2022    8:22 AM 09/18/2022    9:00 AM 06/17/2022    3:19 PM  CMP  Glucose 70 - 99 mg/dL 93  99  161   BUN 6 - 20 mg/dL 23  14  19    Creatinine 0.61 - 1.24 mg/dL 0.96  0.45  4.09   Sodium 135 - 145 mmol/L 138  140  139   Potassium 3.5 - 5.1 mmol/L 4.3  4.4  4.2   Chloride 98 - 111 mmol/L 108  107  110   CO2 22 - 32 mmol/L 24  20  23    Calcium 8.9 - 10.3 mg/dL 9.0  9.0  8.9   Total Protein 6.5 - 8.1 g/dL 7.4  6.9  7.5   Total Bilirubin 0.3 - 1.2 mg/dL 0.3  0.2  <8.1   Alkaline Phos 38 - 126 U/L 82  72  65   AST 15 - 41 U/L 10  13  19    ALT 0 - 44 U/L 13  18  28      Lipid Panel     Component Value Date/Time   CHOL 131 05/19/2022 0902   TRIG 288 (H) 05/19/2022 0902   HDL 34 (L) 05/19/2022 0902   CHOLHDL 3.1 04/17/2021 1011   CHOLHDL 3.5 06/29/2019 0005   VLDL 17 06/29/2019 0005   LDLCALC 52 05/19/2022 0902    CBC    Component Value Date/Time   WBC 7.9 12/17/2022 1914  WBC 12.0 (H) 09/21/2021 1958   RBC 4.24 12/17/2022 0822   RBC 4.24 12/17/2022 0822   HGB 10.7 (L) 12/17/2022 0822   HGB 11.1 (L) 10/29/2022 0853   HCT 33.8 (L) 12/17/2022 0822   HCT 34.6 (L) 10/29/2022 0853   PLT 263 12/17/2022 0822   PLT 278 10/29/2022 0853   MCV 79.7 (L) 12/17/2022 0822   MCV 79 10/29/2022 0853   MCH 25.2 (L) 12/17/2022 0822   MCHC 31.7 12/17/2022 0822   RDW 15.6 (H) 12/17/2022 0822   RDW 15.8 (H) 10/29/2022 0853   LYMPHSABS 2.3 12/17/2022 0822   LYMPHSABS 1.8 10/29/2022 0853   MONOABS 0.7 12/17/2022 0822   EOSABS 0.2 12/17/2022 0822   EOSABS 0.3 10/29/2022 0853   BASOSABS 0.0 12/17/2022 0822   BASOSABS 0.0 10/29/2022 0853    Lab Results  Component Value Date   HGBA1C 6.0 12/21/2022     Assessment & Plan:  1. Type 2 diabetes mellitus with stage 2 chronic kidney disease, with long-term current use of insulin Controlled with A1c of 6.0 Continue Mounjaro, metformin Counseled on Diabetic diet, my plate method, 161 minutes of moderate intensity exercise/week Blood sugar logs with fasting goals of 80-120 mg/dl, random of less than 096 and in the event of sugars less than 60 mg/dl or greater than 045 mg/dl encouraged to notify the clinic. Advised on the need for annual eye exams, annual foot exams, Pneumonia vaccine. - POCT glucose (manual entry) - POCT glycosylated hemoglobin (Hb A1C) - Microalbumin/Creatinine Ratio, Urine - LP+Non-HDL Cholesterol - atorvastatin (LIPITOR) 40 MG tablet; Take 1 tablet (40 mg total) by mouth daily.  Dispense: 90 tablet; Refill: 1 - metFORMIN (GLUCOPHAGE) 500 MG tablet; Take 2 tablets (1,000 mg total) by mouth 2 (two) times daily with a meal.  Dispense: 360 tablet; Refill: 1 - tirzepatide (MOUNJARO) 12.5 MG/0.5ML Pen; Inject 12.5 mg into the skin once a week.  Dispense: 6 mL; Refill: 6  2. Anemia, unspecified type Associated B12 deficiency, folate deficiency Might benefit from B12 supplementation Increase intake of iron rich foods. His hematologist will be contacting him regarding management  3. Hypertension associated with diabetes Controlled Continue current management Counseled on blood pressure goal of less than 130/80, low-sodium, DASH diet, medication compliance, 150 minutes of moderate intensity exercise per week. Discussed medication compliance, adverse effects. - amLODipine (NORVASC) 10 MG tablet; Take 1 tablet (10 mg total) by mouth daily.  Dispense: 90 tablet; Refill: 1 - carvedilol (COREG) 25 MG tablet; Take 1 tablet (25 mg total) by mouth 2 (two) times daily.  Dispense: 180 tablet; Refill: 1 - hydrALAZINE (APRESOLINE) 50 MG tablet; Take 1 tablet (50 mg total) by mouth every 8 (eight) hours.  Dispense: 270 tablet; Refill: 1  4.  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 Plantar fasciitis Advised on stretching exercises for plantar fasciitis including rolling his foot against a tennis ball He will benefit from cortisone injections - Ambulatory referral to Podiatry   Meds ordered this encounter  Medications   amLODipine (NORVASC) 10 MG tablet    Sig: Take 1 tablet (10 mg total) by mouth daily.    Dispense:  90 tablet    Refill:  1   atorvastatin (LIPITOR) 40 MG tablet    Sig: Take 1 tablet (40 mg total) by mouth  daily.    Dispense:  90 tablet    Refill:  1   carvedilol (COREG) 25 MG tablet    Sig: Take 1 tablet (25 mg total) by mouth 2 (two) times daily.    Dispense:  180 tablet    Refill:  1   hydrALAZINE (APRESOLINE) 50 MG tablet    Sig: Take 1 tablet (50 mg total) by mouth every 8 (eight) hours.    Dispense:  270 tablet    Refill:  1   metFORMIN (GLUCOPHAGE) 500 MG tablet    Sig: Take 2 tablets (1,000 mg total) by mouth 2 (two) times daily with a meal.    Dispense:  360 tablet    Refill:  1   tirzepatide (MOUNJARO) 12.5 MG/0.5ML Pen    Sig: Inject 12.5 mg into the skin once a week.    Dispense:  6 mL    Refill:  6   warfarin (COUMADIN) 10 MG tablet    Sig: 10 mg Q Mon-Fri and 1/2 tab Q Sat and Sun    Dispense:  90 tablet    Refill:  1    Follow-up: Return in about 6 months (around 06/22/2023) for Chronic medical conditions.       Hoy Register, MD, FAAFP. [facility] [place], [place] [phone]   12/21/2022, 9:14 AM

## 2022-12-21 NOTE — Patient Instructions (Signed)
Plantar Fasciitis  Plantar fasciitis is a painful foot condition that affects the heel. It occurs when the band of tissue that connects the toes to the heel bone (plantar fascia) becomes irritated. This can happen as the result of exercising too much or doing other repetitive activities (overuse injury). Plantar fasciitis can cause mild irritation to severe pain that makes it difficult to walk or move. The pain is usually worse in the morning after sleeping, or after sitting or lying down for a period of time. Pain may also be worse after long periods of walking or standing. What are the causes? This condition may be caused by: Standing for long periods of time. Wearing shoes that do not have good arch support. Doing activities that put stress on joints (high-impact activities). This includes ballet and exercise that makes your heart beat faster (aerobic exercise), such as running. Being overweight. An abnormal way of walking (gait). Tight muscles in the back of your lower leg (calf). High arches in your feet or flat feet. Starting a new athletic activity. What are the signs or symptoms? The main symptom of this condition is heel pain. Pain may get worse after the following: Taking the first steps after a time of rest, especially in the morning after awakening, or after you have been sitting or lying down for a while. Long periods of standing still. Pain may decrease after 30-45 minutes of activity, such as gentle walking. How is this diagnosed? This condition may be diagnosed based on your medical history, a physical exam, and your symptoms. Your health care provider will check for: A tender area on the bottom of your foot. A high arch in your foot or flat feet. Pain when you move your foot. Difficulty moving your foot. You may have imaging tests to confirm the diagnosis, such as: X-rays. Ultrasound. MRI. How is this treated? Treatment for plantar fasciitis depends on how severe your  condition is. Treatment may include: Rest, ice, pressure (compression), and raising (elevating) the affected foot. This is called RICE therapy. Your health care provider may recommend RICE therapy along with over-the-counter pain medicines to manage your pain. Exercises to stretch your calves and your plantar fascia. A splint that holds your foot in a stretched, upward position while you sleep (night splint). Physical therapy to relieve symptoms and prevent problems in the future. Injections of steroid medicine (cortisone) to relieve pain and inflammation. Stimulating your plantar fascia with electrical impulses (extracorporeal shock wave therapy). This is usually the last treatment option before surgery. Surgery, if other treatments have not worked after 12 months. Follow these instructions at home: Managing pain, stiffness, and swelling  If directed, put ice on the painful area. To do this: Put ice in a plastic bag, or use a frozen bottle of water. Place a towel between your skin and the bag or bottle. Roll the bottom of your foot over the bag or bottle. Do this for 20 minutes, 2-3 times a day. Wear athletic shoes that have air-sole or gel-sole cushions, or try soft shoe inserts that are designed for plantar fasciitis. Elevate your foot above the level of your heart while you are sitting or lying down. Activity Avoid activities that cause pain. Ask your health care provider what activities are safe for you. Do physical therapy exercises and stretches as told by your health care provider. Try activities and forms of exercise that are easier on your joints (low impact). Examples include swimming, water aerobics, and biking. General instructions Take over-the-counter   and prescription medicines only as told by your health care provider. Wear a night splint while sleeping, if told by your health care provider. Loosen the splint if your toes tingle, become numb, or turn cold and blue. Maintain a  healthy weight, or work with your health care provider to lose weight as needed. Keep all follow-up visits. This is important. Contact a health care provider if you have: Symptoms that do not go away with home treatment. Pain that gets worse. Pain that affects your ability to move or do daily activities. Summary Plantar fasciitis is a painful foot condition that affects the heel. It occurs when the band of tissue that connects the toes to the heel bone (plantar fascia) becomes irritated. Heel pain is the main symptom of this condition. It may get worse after exercising too much or standing still for a long time. Treatment varies, but it usually starts with rest, ice, pressure (compression), and raising (elevating) the affected foot. This is called RICE therapy. Over-the-counter medicines can also be used to manage pain. This information is not intended to replace advice given to you by your health care provider. Make sure you discuss any questions you have with your health care provider. Document Revised: 12/04/2019 Document Reviewed: 12/04/2019 Elsevier Patient Education  2023 Elsevier Inc.  

## 2022-12-22 LAB — LP+NON-HDL CHOLESTEROL
Cholesterol, Total: 126 mg/dL (ref 100–199)
HDL: 40 mg/dL (ref >39)
LDL Chol Calc (NIH): 64 mg/dL (ref 0–99)
Total Non-HDL-Chol (LDL+VLDL): 86 mg/dL (ref 0–129)
Triglycerides: 124 mg/dL (ref 0–149)
VLDL Cholesterol Cal: 22 mg/dL (ref 5–40)

## 2022-12-22 LAB — MICROALBUMIN / CREATININE URINE RATIO
Creatinine, Urine: 179.6 mg/dL
Microalb/Creat Ratio: 9 mg/g creat (ref 0–29)
Microalbumin, Urine: 15.9 ug/mL

## 2022-12-23 ENCOUNTER — Telehealth: Payer: Self-pay | Admitting: *Deleted

## 2022-12-23 MED ORDER — FERROUS SULFATE 325 (65 FE) MG PO TBEC: 325.0000 mg | DELAYED_RELEASE_TABLET | Freq: Every day | ORAL | 1 refills | Status: DC

## 2022-12-23 MED ORDER — B-12 1000 MCG PO CAPS: ORAL_CAPSULE | ORAL | 1 refills | Status: DC

## 2022-12-23 MED ORDER — FOLIC ACID 1 MG PO TABS: 1.0000 mg | ORAL_TABLET | Freq: Every day | ORAL | 1 refills | Status: DC

## 2022-12-23 NOTE — Telephone Encounter (Signed)
Notified of message below.  Prescriptions sent to [facility]

## 2022-12-23 NOTE — Telephone Encounter (Signed)
-----   Message from Kyra Searles, RN sent at 12/23/2022 10:05 AM EDT -----  ----- Message ----- From: Jaci Standard, MD Sent: 12/21/2022   9:27 PM EDT To: Kyra Searles, RN  Please let Mr. Maclaughlin know that he has deficiencies of iron, vitamin b12, and folate. This is unusual to be deficient in all 3 nutrients. I have reached out to his GI doctor to see if they recommend further evaluation. For now please call in PO ferrous sulfate  daily with a source of vitamin C, Vitamin b12 1000 mcg PO daily and 1 mg of folate PO daily. We will see him back in 3 months to re-evaluate.   ----- Message ----- From: Interface, Lab In [place] Sent: 12/17/2022   8:25 AM EDT To: Jaci Standard, MD

## 2022-12-24 ENCOUNTER — Encounter: Payer: Self-pay | Admitting: Hematology and Oncology

## 2022-12-24 ENCOUNTER — Other Ambulatory Visit: Payer: Self-pay

## 2023-01-05 ENCOUNTER — Ambulatory Visit (INDEPENDENT_AMBULATORY_CARE_PROVIDER_SITE_OTHER): Payer: BC Managed Care – PPO

## 2023-01-05 ENCOUNTER — Encounter: Payer: Self-pay | Admitting: Podiatry

## 2023-01-05 ENCOUNTER — Ambulatory Visit: Payer: BC Managed Care – PPO | Admitting: Podiatry

## 2023-01-05 DIAGNOSIS — M722 Plantar fascial fibromatosis: Secondary | ICD-10-CM

## 2023-01-05 MED ORDER — DEXAMETHASONE SODIUM PHOSPHATE 120 MG/30ML IJ SOLN
4.0000 mg | Freq: Once | INTRAMUSCULAR | Status: AC
Start: 2023-01-05 — End: ?

## 2023-01-05 NOTE — Patient Instructions (Signed)

## 2023-01-05 NOTE — Progress Notes (Signed)
  Subjective:  Patient ID: William Moss, male    DOB: Jun 20, 1986,   MRN: [id number]  No chief complaint on file.   37 y.o. male presents for concern of right heel pain that has been going on for a couple months. Has tried massaging area with can and rolling on foot. Unable to take anti-inflammatories. He is a diabetic and last A1c was 6.7 . Denies any other pedal complaints. Denies n/v/f/c.   Past Medical History:  Diagnosis Date   Atypical chest pain 05/27/2016   a. 05/2016: NST showing small area of moderare anteroapical ischemia b. 05/2016: cath showing tortous but normal cors which confirmed a false positive NST.   Diabetes mellitus without complication (HCC)    GERD (gastroesophageal reflux disease) 10/02/2016   Hypertension    Pulmonary embolism, bilateral (HCC)     Objective:  Physical Exam: Vascular: DP/PT pulses 2/4 bilateral. CFT <3 seconds. Normal hair growth on digits. No edema.  Skin. No lacerations or abrasions bilateral feet.  Musculoskeletal: MMT 5/5 bilateral lower extremities in DF, PF, Inversion and Eversion. Deceased ROM in DF of ankle joint. Tender to medial calcaneal tubercle on the right. No pain with calcaneal squeeze. No pain with achilles PT or arch.  Neurological: Sensation intact to light touch.   Assessment:   1. Plantar fasciitis, right   2. 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 Discussed plantar fasciitis with patient.  X-rays reviewed and discussed with patient. No acute fractures or dislocations noted. Mild spurring noted at inferior calcaneus.  Reviewed notes from PCP.   Discussed treatment options including, ice, NSAIDS, supportive shoes, bracing, and stretching. Stretching exercises provided to be done on a daily basis.   Patient requesting injection today. Procedure note below.   Discussed prescription anti-inflammatory Cr elevated will avoid prescription  anti-inflammatory at this time. PF brace dispensed.  Follow-up 6 weeks or sooner if any problems arise. In the meantime, encouraged to call the office with any questions, concerns, change in symptoms.   Procedure:  Discussed etiology, pathology, conservative vs. surgical therapies. At this time a plantar fascial injection was recommended.  The patient agreed and a sterile skin prep was applied.  An injection consisting of  dexamethasone and marcaine mixture was infiltrated at the point of maximal tenderness on the right Heel.  Bandaid applied. The patient tolerated this well and was given instructions for aftercare.    Louann Sjogren, DPM

## 2023-01-07 ENCOUNTER — Encounter: Payer: Self-pay | Admitting: Pulmonary Disease

## 2023-01-07 ENCOUNTER — Other Ambulatory Visit: Payer: Self-pay

## 2023-01-07 ENCOUNTER — Ambulatory Visit: Payer: BC Managed Care – PPO | Admitting: Pulmonary Disease

## 2023-01-07 ENCOUNTER — Encounter: Payer: Self-pay | Admitting: Family Medicine

## 2023-01-07 ENCOUNTER — Telehealth: Payer: Self-pay | Admitting: Pharmacist

## 2023-01-07 VITALS — BP 134/78 | HR 79 | Ht 69.0 in | Wt 369.0 lb

## 2023-01-07 DIAGNOSIS — G4730 Sleep apnea, unspecified: Secondary | ICD-10-CM

## 2023-01-07 DIAGNOSIS — I2724 Chronic thromboembolic pulmonary hypertension: Secondary | ICD-10-CM | POA: Diagnosis not present

## 2023-01-07 NOTE — Telephone Encounter (Signed)
Patient is new start to Adempas. Please start BIV.  Dose: Initiate treatment at 1 mg taken three times a day. Increase dosage by 0.5 mg at intervals of no sooner than 2-weeks as  tolerated to a maximum of 2.5 mg three times a day.  Chesley Mires, PharmD, MPH, BCPS, CPP Clinical Pharmacist (Rheumatology and Pulmonology)

## 2023-01-07 NOTE — Progress Notes (Signed)
@Patient  ID: William Moss, male    DOB: [birth year], 37 y.o.   MRN: [id number]  Chief Complaint  Patient presents with   Follow-up    Breathing is good  OSA on CPAP    Referring provider: Hoy Register, MD  HPI:   37 y.o. man with submassive PE s/p TPA presents to clinic for follow up of PE. Cardiology note at [facility] reviewed.   Overall, he is doing okay.  Symptoms stable.  Had planned endarterectomy late 2023 but this was canceled due to concern for surgical risk.  Reviewed right heart catheterization shows elevated pulmonary pressures consistent with pulmonary hypertension mean PA 27, PVR 2.7 Woods unit, normal wedge pressure.  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  Discussed side effects, risk profile.  After shared decision-making, agreed to move forward with prescription for this medication.   HPI at initial visit: Patient presented with long hospital October 2022.  Chest pain, shortness of breath.  CTA PE protocol revealed significant clot burden as well as bilateral left greater than right infiltrates on my review interpretation.  Evidence of heart strain on CT scan.  History of anticoagulation.  Despite this, worsening symptoms and hypoxemia that was refractory and severe.  Echocardiogram was performed but RV could not be visualized on my review.  Given worsening clinical status, systemic tPA was administered.  He had rapid improvement in symptoms.  She returns to clinic today for follow-up.  He is on warfarin which was chosen given his morbid obesity and concern for unreliable anticoagulation with factor Xa antagonist.  He is doing well.  Dyspnea gradually improved.  Still has some residual dyspnea on exertion.  Mainly with longer walks or inclines or stairs.   Questionaires / Pulmonary Flowsheets:   ACT:       No data to display           MMRC: mMRC Dyspnea Scale mMRC Score  08/13/2021  1:52 PM 3    Epworth:      No data to display           Tests:   FENO:  No results found for: "NITRICOXIDE"  PFT:     No data to display           WALK:      No data to display           Imaging: Personally reviewed and as per EMR No results found.  Lab Results: Personally reviewed CBC    Component Value Date/Time   WBC 7.9 12/17/2022 0822   WBC 12.0 (H) 09/21/2021 1958   RBC 4.24 12/17/2022 0822   RBC 4.24 12/17/2022 0822   HGB 10.7 (L) 12/17/2022 0822   HGB 11.1 (L) 10/29/2022 0853   HCT 33.8 (L) 12/17/2022 0822   HCT 34.6 (L) 10/29/2022 0853   PLT 263 12/17/2022 0822   PLT 278 10/29/2022 0853   MCV 79.7 (L) 12/17/2022 0822   MCV 79 10/29/2022 0853   MCH 25.2 (L) 12/17/2022 0822   MCHC 31.7 12/17/2022 0822   RDW 15.6 (H) 12/17/2022 0822   RDW 15.8 (H) 10/29/2022 0853   LYMPHSABS 2.3 12/17/2022 0822   LYMPHSABS 1.8 10/29/2022 0853   MONOABS 0.7 12/17/2022 0822   EOSABS 0.2 12/17/2022 0822   EOSABS 0.3 10/29/2022 0853   BASOSABS 0.0 12/17/2022 7829  BASOSABS 0.0 10/29/2022 0853    BMET    Component Value Date/Time   NA 138 12/17/2022 0822   NA 140 09/18/2022 0900   K 4.3 12/17/2022 0822   CL 108 12/17/2022 0822   CO2 24 12/17/2022 0822   GLUCOSE 93 12/17/2022 0822   BUN 23 (H) 12/17/2022 0822   BUN 14 09/18/2022 0900   CREATININE 1.53 (H) 12/17/2022 0822   CREATININE 0.92 08/28/2016 1000   CALCIUM 9.0 12/17/2022 0822   GFRNONAA 59 (L) 12/17/2022 0822   GFRNONAA >89 08/28/2016 1000   GFRAA 62 10/07/2020 1140   GFRAA >89 08/28/2016 1000    BNP    Component Value Date/Time   BNP 69.6 06/23/2021 0029    ProBNP No results found for: "PROBNP"  Specialty Problems       Pulmonary Problems   Sleep apnea   Acute respiratory failure with hypoxia (HCC)   Pneumonia    Allergies  Allergen Reactions   Lisinopril-Hydrochlorothiazide  Other (See Comments)    "Interfered with the pH level in the serum"  Acidosis type reaction    Immunization History  Administered Date(s) Administered   Influenza,inj,Quad PF,6+ Mos 07/29/2015, 08/28/2016, 09/17/2017, 09/13/2018, 05/15/2019, 05/30/2020, 06/10/2022   Influenza-Unspecified 07/15/2021   Moderna Covid-19 Vaccine Bivalent Booster 67yrs & up 07/14/2022   PFIZER(Purple Top)SARS-COV-2 Vaccination 11/26/2019, 12/26/2019, 07/01/2020   Pfizer Covid-19 Vaccine Bivalent Booster 19yrs & up 08/21/2021   Pneumococcal Polysaccharide-23 08/28/2015   Tdap 05/15/2019    Past Medical History:  Diagnosis Date   Atypical chest pain 05/27/2016   a. 05/2016: NST showing small area of moderare anteroapical ischemia b. 05/2016: cath showing tortous but normal cors which confirmed a false positive NST.   Diabetes mellitus without complication (HCC)    GERD (gastroesophageal reflux disease) 10/02/2016   Hypertension    Pulmonary embolism, bilateral (HCC)     Tobacco History: Social History   Tobacco Use  Smoking Status Never  Smokeless Tobacco Never   Counseling given: Not Answered   Continue to not smoke  Outpatient Encounter Medications as of 01/07/2023  Medication Sig   amLODipine (NORVASC) 10 MG tablet Take 1 tablet (10 mg total) by mouth daily.   atorvastatin (LIPITOR) 40 MG tablet Take 1 tablet (40 mg total) by mouth daily.   Blood Glucose Monitoring Suppl (ONETOUCH VERIO REFLECT) w/Device KIT Check blood sugar TID E11.69   carvedilol (COREG) 25 MG tablet Take 1 tablet (25 mg total) by mouth 2 (two) times daily.   Cyanocobalamin (B-12) 1000 MCG CAPS Take 1 tablet daily   ferrous sulfate 325 (65 FE) MG EC tablet Take 1 tablet (325 mg total) by mouth daily. Take with a source of Vitamin C   folic acid (FOLVITE) 1 MG tablet Take 1 tablet (1 mg total) by mouth daily.   hydrALAZINE (APRESOLINE) 50 MG tablet Take 1 tablet (50 mg total) by mouth every 8 (eight) hours.    hydrocortisone (ANUSOL-HC) 25 MG suppository Place 1 suppository (25 mg total) rectally at bedtime.   Insulin Pen Needle (BD PEN NEEDLE NANO U/F) 32G X 4 MM MISC 10 Units by Does not apply route daily.   Lancets (ONETOUCH DELICA PLUS LANCET33G) MISC CHECK BLOOD SUGAR 3 TIMES A DAY   metFORMIN (GLUCOPHAGE) 500 MG tablet Take 2 tablets (1,000 mg total) by mouth 2 (two) times daily with a meal.   ondansetron (ZOFRAN) 4 MG tablet Take 1 tablet (4 mg total) by mouth every 8 (eight) hours as needed for nausea or  vomiting.   tirzepatide (MOUNJARO) 12.5 MG/0.5ML Pen Inject 12.5 mg into the skin once a week.   warfarin (COUMADIN) 10 MG tablet 10 mg Q Mon-Fri and 1/2 tab Q Sat and Sun   HYDROcodone-acetaminophen (NORCO) 5-325 MG tablet Take 1 tablet by mouth every 6 (six) hours as needed for moderate pain. (Patient not taking: Reported on 01/07/2023)   [DISCONTINUED] lisinopril-hydrochlorothiazide (ZESTORETIC) 20-12.5 MG tablet Take 2 tablets by mouth daily.   Facility-Administered Encounter Medications as of 01/07/2023  Medication   dexamethasone (DECADRON) injection 4 mg     Review of Systems  Review of Systems  No chest pain.  No orthopnea or PND.  No black or tarry stools.  No frank blood in stools.  No hemoptysis, no hematemesis.  Comprehensive review of systems otherwise negative. Physical Exam  BP 134/78 (BP Location: Left Arm, Patient Position: Sitting, Cuff Size: Large)   Pulse 79   Ht 5\' 9"  (1.753 m)   Wt (!) 369 lb (167.4 kg)   SpO2 97%   BMI 54.49 kg/m   Wt Readings from Last 5 Encounters:  01/07/23 (!) 369 lb (167.4 kg)  12/21/22 (!) 368 lb 9.6 oz (167.2 kg)  12/17/22 (!) 376 lb 3.2 oz (170.6 kg)  11/10/22 (!) 370 lb (167.8 kg)  08/25/22 (!) 379 lb (171.9 kg)    BMI Readings from Last 5 Encounters:  01/07/23 54.49 kg/m  12/21/22 54.43 kg/m  12/17/22 55.56 kg/m  11/10/22 54.64 kg/m  08/25/22 55.97 kg/m     Physical Exam General: Obese, sitting in exam chair Eyes:  EOMI, icterus Neck: Supple, no JVP Pulmonary: Clear, distant Cardiovascular: Regular rate and rhythm, no murmur appreciated Abdomen: Nondistended, bowel sounds present MSK: No synovitis, no joint effusion Neuro: Normal gait, no weakness patient Psych: Normal mood, full affect   Assessment & Plan:   Submassive PE: (06/23/21) With severe refractory hypoxemia status post systemic tPA.  Symptoms improved over time.  Still residual dyspnea. Unsuccessful in determining RV function and size etc. during hospitalization.  TTE 08/2021 able to view RV and did demonstrate mild enlargement and decreased function, as well as left atrial dilation.  He is  to continue warfarin.  Given RV changes, VQ scan was ordered which did demonstrate ongoing residual filling defects.  Evaluated at [facility], CTEPH program, had initially recommended endarterectomy but given surgical risk this was canceled.  To continue anticoagulation indefinitely.  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  Given young age, I think it wise to try to be aggressive with medications.  Continue warfarin.  Start process for prescription of riociguat, start 1 mg 3 times daily, will plan to titrate up to planned dose 2.5 mg 3 times daily as tolerated.  Side effects discussed.  Enrollment forms today.    Return in about 2 months (around 03/09/2023).   Karren Burly, MD 01/07/2023   I spent 40 minutes in the care of the patient including face-to-face visit, review of records, coordination of care.

## 2023-01-07 NOTE — Telephone Encounter (Signed)
Submitted a Prior Authorization request to [facility] for  ADEMPAS  via CoverMyMeds. Will update once we receive a response.  Key: [id number]

## 2023-01-07 NOTE — Patient Instructions (Addendum)
Nice to see you again  I like to start a new medication called riociguat or Adempas  This is to treat the pulmonary hypertension associated with the chronic blood clots in your lungs  This medication is taken 3 times a day  We will start with relatively low-dose and increase over time  Common side effects are nausea, vomiting, diarrhea, headache, sometimes jaw pain.  Please let me know if you are experiencing any of this.  Hope would be would help you breathe easier, be less short of breath.  Return to clinic in 2 months or sooner as needed with Dr. Judeth Horn

## 2023-01-11 ENCOUNTER — Other Ambulatory Visit: Payer: Self-pay

## 2023-01-11 ENCOUNTER — Other Ambulatory Visit: Payer: Self-pay | Admitting: Pharmacist

## 2023-01-11 ENCOUNTER — Ambulatory Visit: Payer: BC Managed Care – PPO | Attending: Family Medicine | Admitting: Pharmacist

## 2023-01-11 DIAGNOSIS — I82512 Chronic embolism and thrombosis of left femoral vein: Secondary | ICD-10-CM

## 2023-01-11 DIAGNOSIS — E1122 Type 2 diabetes mellitus with diabetic chronic kidney disease: Secondary | ICD-10-CM

## 2023-01-11 LAB — POCT INR: POC INR: 2.5

## 2023-01-11 MED ORDER — TIRZEPATIDE 12.5 MG/0.5ML ~~LOC~~ SOAJ
12.5000 mg | SUBCUTANEOUS | 6 refills | Status: DC
Start: 2023-01-11 — End: 2023-10-25
  Filled 2023-01-11: qty 6, 84d supply, fill #0
  Filled 2023-01-13: qty 2, 28d supply, fill #0
  Filled 2023-02-12 – 2023-02-18 (×3): qty 2, 28d supply, fill #1
  Filled 2023-03-10 – 2023-03-11 (×2): qty 2, 28d supply, fill #2
  Filled 2023-04-11 – 2023-04-15 (×2): qty 2, 28d supply, fill #3
  Filled 2023-05-06: qty 2, 28d supply, fill #4
  Filled 2023-06-04: qty 2, 28d supply, fill #5
  Filled 2023-06-30: qty 2, 28d supply, fill #6
  Filled 2023-07-31: qty 2, 28d supply, fill #7
  Filled 2023-08-26: qty 2, 28d supply, fill #8
  Filled 2023-09-24: qty 2, 28d supply, fill #9
  Filled 2023-10-22: qty 2, 28d supply, fill #10

## 2023-01-11 NOTE — Telephone Encounter (Signed)
Received a fax regarding Prior Authorization from [facility] for  ADEMPAS . Authorization has been DENIED because pre-treatment PVR must be > 3 Wood units. Patient's PVR was 2.7  Spoke with Dr Judeth Horn in person. We will plan to submit 2022 ERS guidelines for PAH.  Chesley Mires, PharmD, MPH, BCPS, CPP Clinical Pharmacist (Rheumatology and Pulmonology)

## 2023-01-13 NOTE — Telephone Encounter (Signed)
Submitted an URGENT appeal to [facility] for  ADEMPAS .  Reference # [id number] Phone: [phone] Fax: [phone] (for urgent appeal)

## 2023-01-14 ENCOUNTER — Other Ambulatory Visit: Payer: Self-pay

## 2023-01-18 ENCOUNTER — Other Ambulatory Visit (HOSPITAL_COMMUNITY): Payer: Self-pay

## 2023-01-18 MED ORDER — ADEMPAS 1 MG PO TABS
ORAL_TABLET | ORAL | 0 refills | Status: AC
Start: 2023-01-18 — End: ?

## 2023-01-18 MED ORDER — ADEMPAS 2 MG PO TABS
ORAL_TABLET | ORAL | 0 refills | Status: AC
Start: 2023-01-18 — End: ?

## 2023-01-18 NOTE — Telephone Encounter (Signed)
Discussed dosing of Adempas with Dr. Judeth Horn. Patient does not have history of hypotension and would like to get him to goal dose of 2.5mg  three times daily as quickly as is safely possible.  1mg  three times daily x 2 weeks. Increase by 0.5mg  three times daily every 2 weeks as long as SBP > and no symptoms of hypotension. Goal dose is 2.5mg  three times daily   MyChart message sent to patient to advise that he will need BP cuff at home to measure BP consistently. Will f/u  Chesley Mires, PharmD, MPH, BCPS, CPP Clinical Pharmacist (Rheumatology and Pulmonology)

## 2023-01-18 NOTE — Telephone Encounter (Signed)
Received notification from [facility] regarding a prior authorization for  ADEMPAS . Authorization has been APPROVED from 12/17/2022 to 01/16/2024. Approval letter sent to scan center.  Patient must fill through [facility]: [phone]  Authorization # [id number]   ATC pt to coordinate enrollment into copay card program, LVM with direct office phone number requesting return call. Will await f/u.

## 2023-01-19 ENCOUNTER — Other Ambulatory Visit: Payer: Self-pay

## 2023-02-10 NOTE — Telephone Encounter (Signed)
[facility]ed [facility] for update on patient's Adempas rx. Per rep, form for Adempas hub must besent there. Completed and faxed to hub today  Fax: [phone]  Once received by hub, they said information to [facility] from there.  Chesley Mires, PharmD, MPH, BCPS, CPP Clinical Pharmacist (Rheumatology and Pulmonology)

## 2023-02-11 NOTE — Progress Notes (Signed)
    Pharmacy Anticoagulation Clinic  Subjective: Patient presents today for INR monitoring. 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   Current dose of warfarin: 1/2 tablet (5 mg) every Sunday, Saturday; 1 tablet (10 mg) all other days  Adherence to warfarin: in a week time span, patient reports no missed doses  Signs/symptoms of bleeding: No s/sx of bleeding  Recent changes in diet: Reports eating [place] food once last week Recent changes in medications: reports prescribed Riociguat from pulmonologist but has not picked up yet Upcoming procedures that may impact anticoagulation: No upcoming procedures   Objective: Today's INR = 2.6  Lab Results  Component Value Date   INR 2.5 01/11/2023   INR 2.4 12/10/2022   INR 2.3 10/29/2022     Assessment and Plan: Anticoagulation: Patient is Therapeutic based on patient's INR of 2.6 and patient's INR goal of 2-3. Will  continue  current warfarin dose: 1/2 tablet (5 mg) every Sunday, Saturday; 1 tablet (10 mg) all other days  Patient verbalized understanding and was provided with written instructions. Next INR check planned for 03/19/2023.

## 2023-02-12 ENCOUNTER — Ambulatory Visit: Payer: BC Managed Care – PPO | Attending: Family Medicine | Admitting: Pharmacist

## 2023-02-12 DIAGNOSIS — I82512 Chronic embolism and thrombosis of left femoral vein: Secondary | ICD-10-CM

## 2023-02-12 LAB — POCT INR: POC INR: 2.6

## 2023-02-13 ENCOUNTER — Other Ambulatory Visit (HOSPITAL_COMMUNITY): Payer: Self-pay

## 2023-02-15 ENCOUNTER — Other Ambulatory Visit: Payer: Self-pay

## 2023-02-16 ENCOUNTER — Ambulatory Visit: Payer: BC Managed Care – PPO | Admitting: Podiatry

## 2023-02-16 ENCOUNTER — Encounter: Payer: Self-pay | Admitting: Podiatry

## 2023-02-16 DIAGNOSIS — M722 Plantar fascial fibromatosis: Secondary | ICD-10-CM

## 2023-02-16 NOTE — Progress Notes (Signed)
  Subjective:  Patient ID: William Moss, male    DOB: [birth year],   MRN: [id number]  Chief Complaint  Patient presents with   Plantar Fasciitis    Right foot plantar fasciitis patient states foot is still improving     37 y.o. male presents for follow-up of plantar fasciitis relates it is improving. Relates he has been stretching and wearing the brace and doing about 65% better. Marland Kitchen He is a diabetic and last A1c was 6.7 . Denies any other pedal complaints. Denies n/v/f/c.   Past Medical History:  Diagnosis Date   Atypical chest pain 05/27/2016   a. 05/2016: NST showing small area of moderare anteroapical ischemia b. 05/2016: cath showing tortous but normal cors which confirmed a false positive NST.   Diabetes mellitus without complication (HCC)    GERD (gastroesophageal reflux disease) 10/02/2016   Hypertension    Pulmonary embolism, bilateral (HCC)     Objective:  Physical Exam: Vascular: DP/PT pulses 2/4 bilateral. CFT <3 seconds. Normal hair growth on digits. No edema.  Skin. No lacerations or abrasions bilateral feet.  Musculoskeletal: MMT 5/5 bilateral lower extremities in DF, PF, Inversion and Eversion. Deceased ROM in DF of ankle joint. Tender to medial calcaneal tubercle on the right. No pain with calcaneal squeeze. No pain with achilles PT or arch.  Neurological: Sensation intact to light touch.   Assessment:   1. Plantar fasciitis, right       Plan:  Patient was evaluated and treated and all questions answered. Discussed plantar fasciitis with patient.  X-rays reviewed and discussed with patient. No acute fractures or dislocations noted. Mild spurring noted at inferior calcaneus.  Reviewed notes from PCP.   Discussed treatment options including, ice, NSAIDS, supportive shoes, bracing, and stretching. \ Continue stretching and brace for now.  Discussed CMO in future vs PT vs another injeciton if symptom worsen.  Follow-up as needed     Louann Sjogren, DPM

## 2023-02-17 NOTE — Telephone Encounter (Signed)
Received fax from [employer] Program with summary of benefits. [employer]'s copay is $3600 which would be covered with copay card assistance.  Case # [id number] Phone: [phone]  Per [facility] portal, order is still in progress  Chesley Mires, PharmD, MPH, BCPS, CPP Clinical Pharmacist (Rheumatology and Pulmonology)

## 2023-02-18 ENCOUNTER — Other Ambulatory Visit: Payer: Self-pay

## 2023-02-24 NOTE — Telephone Encounter (Signed)
Per [facility] portal, rx for Adempas is ready for scheduling. MyChart message sent to patient for update  Chesley Mires, PharmD, MPH, BCPS, CPP Clinical Pharmacist (Rheumatology and Pulmonology)

## 2023-03-09 DIAGNOSIS — Z7182 Exercise counseling: Secondary | ICD-10-CM | POA: Diagnosis not present

## 2023-03-09 DIAGNOSIS — Z79899 Other long term (current) drug therapy: Secondary | ICD-10-CM | POA: Diagnosis not present

## 2023-03-09 DIAGNOSIS — I2724 Chronic thromboembolic pulmonary hypertension: Secondary | ICD-10-CM | POA: Diagnosis not present

## 2023-03-10 ENCOUNTER — Other Ambulatory Visit: Payer: Self-pay

## 2023-03-11 NOTE — Telephone Encounter (Signed)
[facility] can not get in touch with PT. State they will no longer pursue doing so after several attempts.He can call [phone].

## 2023-03-11 NOTE — Telephone Encounter (Signed)
Left VM with patient advising that [facility] has attempted to reach him multiple times to schedule shipment of Adempas. Advised in VM that they will no longer be making outreach to him.  Chesley Mires, PharmD, MPH, BCPS, CPP Clinical Pharmacist (Rheumatology and Pulmonology)

## 2023-03-15 ENCOUNTER — Encounter: Payer: Self-pay | Admitting: Podiatry

## 2023-03-16 ENCOUNTER — Ambulatory Visit: Payer: BC Managed Care – PPO | Admitting: Pulmonary Disease

## 2023-03-16 ENCOUNTER — Other Ambulatory Visit: Payer: Self-pay

## 2023-03-16 ENCOUNTER — Emergency Department (HOSPITAL_COMMUNITY)
Admission: EM | Admit: 2023-03-16 | Discharge: 2023-03-16 | Disposition: A | Discharge status: Home / Self Care | Payer: BC Managed Care – PPO | Attending: Emergency Medicine | Admitting: Emergency Medicine

## 2023-03-16 ENCOUNTER — Encounter: Payer: Self-pay | Admitting: Pulmonary Disease

## 2023-03-16 ENCOUNTER — Emergency Department (HOSPITAL_COMMUNITY): Payer: BC Managed Care – PPO

## 2023-03-16 VITALS — BP 136/82 | HR 85 | Ht 69.0 in | Wt 356.0 lb

## 2023-03-16 DIAGNOSIS — Z7984 Long term (current) use of oral hypoglycemic drugs: Secondary | ICD-10-CM | POA: Diagnosis not present

## 2023-03-16 DIAGNOSIS — S92011A Displaced fracture of body of right calcaneus, initial encounter for closed fracture: Secondary | ICD-10-CM | POA: Diagnosis not present

## 2023-03-16 DIAGNOSIS — S92041A Displaced other fracture of tuberosity of right calcaneus, initial encounter for closed fracture: Secondary | ICD-10-CM | POA: Insufficient documentation

## 2023-03-16 DIAGNOSIS — X509XXA Other and unspecified overexertion or strenuous movements or postures, initial encounter: Secondary | ICD-10-CM | POA: Diagnosis not present

## 2023-03-16 DIAGNOSIS — Z79899 Other long term (current) drug therapy: Secondary | ICD-10-CM | POA: Diagnosis not present

## 2023-03-16 DIAGNOSIS — S99921A Unspecified injury of right foot, initial encounter: Secondary | ICD-10-CM | POA: Diagnosis not present

## 2023-03-16 DIAGNOSIS — I2724 Chronic thromboembolic pulmonary hypertension: Secondary | ICD-10-CM

## 2023-03-16 DIAGNOSIS — M79671 Pain in right foot: Secondary | ICD-10-CM

## 2023-03-16 DIAGNOSIS — Z794 Long term (current) use of insulin: Secondary | ICD-10-CM | POA: Insufficient documentation

## 2023-03-16 DIAGNOSIS — S9031XA Contusion of right foot, initial encounter: Secondary | ICD-10-CM | POA: Diagnosis not present

## 2023-03-16 DIAGNOSIS — S92001A Unspecified fracture of right calcaneus, initial encounter for closed fracture: Secondary | ICD-10-CM

## 2023-03-16 DIAGNOSIS — S90111A Contusion of right great toe without damage to nail, initial encounter: Secondary | ICD-10-CM | POA: Insufficient documentation

## 2023-03-16 MED ORDER — ACETAMINOPHEN 500 MG PO TABS: 500.0000 mg | ORAL_TABLET | Freq: Four times a day (QID) | ORAL | 0 refills | Status: AC | PRN

## 2023-03-16 NOTE — ED Triage Notes (Signed)
Pt states he rolled right ankle one week ago. Has been having pain since then

## 2023-03-16 NOTE — Progress Notes (Signed)
@Patient  ID: William Moss, male    DOB: 1986-03-13, 37 y.o.   MRN: [id number]  Chief Complaint  Patient presents with   Follow-up    2 mo f/u. States his breathing has been stable since last visit. Denies any new swelling besides the swelling from a fall last week.     Referring provider: Hoy Register, MD  HPI:   37 y.o. man with submassive PE s/p TPA presents to clinic for follow up of PE. Most recent Cardiology note at [facility] reviewed.   Overall, he is doing okay.  Recently seen again by Dr. Monia Pouch at [facility].  To discussion about needed length of anticoagulation.  Thinks he was referred to hematology.  At last visit we discussed initiation of riociguat.  Initially insurance denied this but after appeal it was approved. Yet to start - needs to contact pharmacy. He is amenable to begin.  Complains of foot pain on right after mechanical fall the other day. Foot fell asleep. Plans to go to ER. Gave number and address of [facility] to consider a orthopedic centric approach instead of ED evaluation.  HPI at initial visit: Patient presented with long hospital October 2022.  Chest pain, shortness of breath.  CTA PE protocol revealed significant clot burden as well as bilateral left greater than right infiltrates on my review interpretation.  Evidence of heart strain on CT scan.  History of anticoagulation.  Despite this, worsening symptoms and hypoxemia that was refractory and severe.  Echocardiogram was performed but RV could not be visualized on my review.  Given worsening clinical status, systemic tPA was administered.  He had rapid improvement in symptoms.  37 y.o. man returns to clinic today for follow-up.  He is on warfarin which was chosen given his morbid obesity and concern for unreliable anticoagulation with factor Xa antagonist.  He is doing well.  Dyspnea gradually improved.  Still has some residual dyspnea on exertion.  Mainly with longer walks or inclines or  stairs.   Questionaires / Pulmonary Flowsheets:   ACT:      No data to display          MMRC: mMRC Dyspnea Scale mMRC Score  08/13/2021  1:52 PM 3    Epworth:      No data to display          Tests:   FENO:  No results found for: "NITRICOXIDE"  PFT:     No data to display          WALK:      No data to display          Imaging: Personally reviewed and as per EMR No results found.  Lab Results: Personally reviewed CBC    Component Value Date/Time   WBC 7.9 12/17/2022 0822   WBC 12.0 (H) 09/21/2021 1958   RBC 4.24 12/17/2022 0822   RBC 4.24 12/17/2022 0822   HGB 10.7 (L) 12/17/2022 0822   HGB 11.1 (L) 10/29/2022 0853   HCT 33.8 (L) 12/17/2022 0822   HCT 34.6 (L) 10/29/2022 0853   PLT 263 12/17/2022 0822   PLT 278 10/29/2022 0853   MCV 79.7 (L) 12/17/2022 0822   MCV 79 10/29/2022 0853   MCH 25.2 (L) 12/17/2022 0822   MCHC 31.7 12/17/2022 0822   RDW 15.6 (H) 12/17/2022 0822   RDW 15.8 (H) 10/29/2022 0853   LYMPHSABS 2.3 12/17/2022 0822   LYMPHSABS 1.8 10/29/2022 0853   MONOABS 0.7 12/17/2022 0822   EOSABS  0.2 12/17/2022 0822   EOSABS 0.3 10/29/2022 0853   BASOSABS 0.0 12/17/2022 0822   BASOSABS 0.0 10/29/2022 0853    BMET    Component Value Date/Time   NA 138 12/17/2022 0822   NA 140 09/18/2022 0900   K 4.3 12/17/2022 0822   CL 108 12/17/2022 0822   CO2 24 12/17/2022 0822   GLUCOSE 93 12/17/2022 0822   BUN 23 (H) 12/17/2022 0822   BUN 14 09/18/2022 0900   CREATININE 1.53 (H) 12/17/2022 0822   CREATININE 0.92 08/28/2016 1000   CALCIUM 9.0 12/17/2022 0822   GFRNONAA 59 (L) 12/17/2022 0822   GFRNONAA >89 08/28/2016 1000   GFRAA 62 10/07/2020 1140   GFRAA >89 08/28/2016 1000    BNP    Component Value Date/Time   BNP 69.6 06/23/2021 0029    ProBNP No results found for: "PROBNP"  Specialty Problems       Pulmonary Problems   Sleep apnea   Acute respiratory failure with hypoxia (HCC)   Pneumonia    Allergies   Allergen Reactions   Lisinopril-Hydrochlorothiazide Other (See Comments)    "Interfered with the pH level in the serum"  Acidosis type reaction    Immunization History  Administered Date(s) Administered   Influenza,inj,Quad PF,6+ Mos 07/29/2015, 08/28/2016, 09/17/2017, 09/13/2018, 05/15/2019, 05/30/2020, 06/10/2022   Influenza-Unspecified 07/15/2021   Moderna Covid-19 Vaccine Bivalent Booster 35yrs & up 07/14/2022   PFIZER(Purple Top)SARS-COV-2 Vaccination 11/26/2019, 12/26/2019, 07/01/2020   Pfizer Covid-19 Vaccine Bivalent Booster 37yrs & up 08/21/2021   Pneumococcal Polysaccharide-23 08/28/2015   Tdap 05/15/2019    Past Medical History:  Diagnosis Date   Atypical chest pain 05/27/2016   a. 05/2016: NST showing small area of moderare anteroapical ischemia b. 05/2016: cath showing tortous but normal cors which confirmed a false positive NST.   Diabetes mellitus without complication (HCC)    GERD (gastroesophageal reflux disease) 10/02/2016   Hypertension    Pulmonary embolism, bilateral (HCC)     Tobacco History: Social History   Tobacco Use  Smoking Status Never  Smokeless Tobacco Never   Counseling given: Not Answered   Continue to not smoke  Outpatient Encounter Medications as of 03/16/2023  Medication Sig   amLODipine (NORVASC) 10 MG tablet Take 1 tablet (10 mg total) by mouth daily.   atorvastatin (LIPITOR) 40 MG tablet Take 1 tablet (40 mg total) by mouth daily.   Blood Glucose Monitoring Suppl (ONETOUCH VERIO REFLECT) w/Device KIT Check blood sugar TID E11.69   carvedilol (COREG) 25 MG tablet Take 1 tablet (25 mg total) by mouth 2 (two) times daily.   Cyanocobalamin (B-12) 1000 MCG CAPS Take 1 tablet daily   ferrous sulfate 325 (65 FE) MG EC tablet Take 1 tablet (325 mg total) by mouth daily. Take with a source of Vitamin C   folic acid (FOLVITE) 1 MG tablet Take 1 tablet (1 mg total) by mouth daily.   hydrALAZINE (APRESOLINE) 50 MG tablet Take 1 tablet (50  mg total) by mouth every 8 (eight) hours.   hydrocortisone (ANUSOL-HC) 25 MG suppository Place 1 suppository (25 mg total) rectally at bedtime.   Insulin Pen Needle (BD PEN NEEDLE NANO U/F) 32G X 4 MM MISC 10 Units by Does not apply route daily.   Lancets (ONETOUCH DELICA PLUS LANCET33G) MISC CHECK BLOOD SUGAR 3 TIMES A DAY   metFORMIN (GLUCOPHAGE) 500 MG tablet Take 2 tablets (1,000 mg total) by mouth 2 (two) times daily with a meal.   ondansetron (ZOFRAN) 4 MG tablet  Take 1 tablet (4 mg total) by mouth every 8 (eight) hours as needed for nausea or vomiting.   Riociguat (ADEMPAS) 1 MG TABS Rx # 1: Take 1 tab by mouth three times daily x 2 weeks. If systolic BP > 95, then increase to 1.5 tabs three times daily x 2 weeks   Riociguat (ADEMPAS) 2 MG TABS Rx # 2: Take 1 tab by mouth three times daily x 2 weeks   tirzepatide (MOUNJARO) 12.5 MG/0.5ML Pen Inject 12.5 mg into the skin once a week.   warfarin (COUMADIN) 10 MG tablet 10 mg Q Mon-Fri and 1/2 tab Q Sat and Sun   [DISCONTINUED] HYDROcodone-acetaminophen (NORCO) 5-325 MG tablet Take 1 tablet by mouth every 6 (six) hours as needed for moderate pain. (Patient not taking: Reported on 01/07/2023)   [DISCONTINUED] lisinopril-hydrochlorothiazide (ZESTORETIC) 20-12.5 MG tablet Take 2 tablets by mouth daily.   Facility-Administered Encounter Medications as of 03/16/2023  Medication   dexamethasone (DECADRON) injection 4 mg     Review of Systems  Review of Systems  N/a   Physical Exam  BP 136/82   Pulse 85   Ht 5\' 9"  (1.753 m)   Wt (!) 356 lb (161.5 kg)   SpO2 99% Comment: on RA  BMI 52.57 kg/m   Wt Readings from Last 5 Encounters:  03/16/23 (!) 356 lb (161.5 kg)  01/07/23 (!) 369 lb (167.4 kg)  12/21/22 (!) 368 lb 9.6 oz (167.2 kg)  12/17/22 (!) 376 lb 3.2 oz (170.6 kg)  11/10/22 (!) 370 lb (167.8 kg)    BMI Readings from Last 5 Encounters:  03/16/23 52.57 kg/m  01/07/23 54.49 kg/m  12/21/22 54.43 kg/m  12/17/22 55.56 kg/m   11/10/22 54.64 kg/m     Physical Exam General: Obese, sitting in exam chair Eyes: EOMI, icterus Neck: Supple, no JVP Pulmonary: Clear, distant Cardiovascular: Regular rate and rhythm, no murmur appreciated Abdomen: Nondistended, bowel sounds present MSK: No synovitis, no joint effusion Neuro: Normal gait, no weakness patient Psych: Normal mood, full affect   Assessment & Plan:   Submassive PE: (06/23/21) With severe refractory hypoxemia status post systemic tPA.  Symptoms improved over time.  Still residual dyspnea. Unsuccessful in determining RV function and size etc. during hospitalization.  TTE 08/2021 able to view RV and did demonstrate mild enlargement and decreased function, as well as left atrial dilation.  He is  to continue warfarin.  Given RV changes, VQ scan was ordered which did demonstrate ongoing residual filling defects.  Evaluated at [facility], CTE[facility] program, had initially recommended endarterectomy but given surgical risk this was canceled.  To continue anticoagulation indefinitely.  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  Given young age, I think it wise to try to be aggressive with medications.  Continue warfarin.  Riociguat prescribed last visit. Not yet started, encouraged to contact pharmacy to receive shipment and begin.   Return in about 3 months (around 06/16/2023) for f/u Dr. Judeth Horn.   Karren Burly, MD 03/16/2023

## 2023-03-16 NOTE — Progress Notes (Signed)
Orthopedic Tech Progress Note Patient Details:  William Moss [birth year] [id number]  Ortho Devices Type of Ortho Device: Prafo boot/shoe Ortho Device/Splint Location: right Ortho Device/Splint Interventions: Ordered, Application, Adjustment   Post Interventions Patient Tolerated: Well Instructions Provided: Adjustment of device, Care of device  Kizzie Fantasia 03/16/2023, 11:34 AM

## 2023-03-16 NOTE — ED Provider Notes (Signed)
[place] EMERGENCY DEPARTMENT AT [facility] Provider Note   CSN: [id number] Arrival date & time: 03/16/23  8469     History  Chief Complaint  Patient presents with   Ankle Pain    ROWEN WILMER is a 37 y.o. male.  HPI    37 year old male comes in with chief complaint of ankle pain.  Pt states that about a week ago he rolled his ankle and in the process injured his foot.  He has been icing the area and taking over-the-counter pain medicine.  The ankle itself is better, however 2 days ago he bumped his toe into his car as he was coming out of the car and now he is having worsening pain in his toe.  The pain is primarily around the great toe of the left foot.  Patient has significant discomfort when he walks and pivots onto the toe.  Home Medications Prior to Admission medications   Medication Sig Start Date End Date Taking? Authorizing Provider  acetaminophen (TYLENOL) 500 MG tablet Take 1 tablet (500 mg total) by mouth every 6 (six) hours as needed. 03/16/23  Yes Kesa Birky, MD  amLODipine (NORVASC) 10 MG tablet Take 1 tablet (10 mg total) by mouth daily. 12/21/22   Hoy Register, MD  atorvastatin (LIPITOR) 40 MG tablet Take 1 tablet (40 mg total) by mouth daily. 12/21/22   Hoy Register, MD  Blood Glucose Monitoring Suppl (ONETOUCH VERIO REFLECT) w/Device KIT Check blood sugar TID E11.69 10/07/20   Hoy Register, MD  carvedilol (COREG) 25 MG tablet Take 1 tablet (25 mg total) by mouth 2 (two) times daily. 12/21/22   Hoy Register, MD  Cyanocobalamin (B-12) 1000 MCG CAPS Take 1 tablet daily 12/23/22   Jaci Standard, MD  ferrous sulfate 325 (65 FE) MG EC tablet Take 1 tablet (325 mg total) by mouth daily. Take with a source of Vitamin C 12/23/22   Jaci Standard, MD  folic acid (FOLVITE) 1 MG tablet Take 1 tablet (1 mg total) by mouth daily. 12/23/22   Jaci Standard, MD  hydrALAZINE (APRESOLINE) 50 MG tablet Take 1 tablet (50 mg total) by mouth every 8  (eight) hours. 12/21/22   Hoy Register, MD  hydrocortisone (ANUSOL-HC) 25 MG suppository Place 1 suppository (25 mg total) rectally at bedtime. 11/10/22   Hilarie Fredrickson, MD  Insulin Pen Needle (BD PEN NEEDLE NANO U/F) 32G X 4 MM MISC 10 Units by Does not apply route daily. 10/07/20   Hoy Register, MD  Lancets (ONETOUCH DELICA PLUS LANCET33G) MISC CHECK BLOOD SUGAR 3 TIMES A DAY 01/13/21   Hoy Register, MD  metFORMIN (GLUCOPHAGE) 500 MG tablet Take 2 tablets (1,000 mg total) by mouth 2 (two) times daily with a meal. 12/21/22   Hoy Register, MD  ondansetron (ZOFRAN) 4 MG tablet Take 1 tablet (4 mg total) by mouth every 8 (eight) hours as needed for nausea or vomiting. 09/21/21   Horton, Clabe Seal, DO  Riociguat (ADEMPAS) 1 MG TABS Rx # 1: Take 1 tab by mouth three times daily x 2 weeks. If systolic BP > 95, then increase to 1.5 tabs three times daily x 2 weeks 01/18/23   Hunsucker, Lesia Sago, MD  Riociguat (ADEMPAS) 2 MG TABS Rx # 2: Take 1 tab by mouth three times daily x 2 weeks 01/18/23   Hunsucker, Lesia Sago, MD  tirzepatide [facility]) 12.5 MG/0.5ML Pen Inject 12.5 mg into the skin once a week. 01/11/23  Hoy Register, MD  warfarin (COUMADIN) 10 MG tablet 10 mg Q Mon-Fri and 1/2 tab Q Sat and Sun 12/21/22   Hoy Register, MD  lisinopril-hydrochlorothiazide (ZESTORETIC) 20-12.5 MG tablet Take 2 tablets by mouth daily. 07/22/20 08/27/20  Hoy Register, MD      Allergies    Lisinopril-hydrochlorothiazide    Review of Systems   Review of Systems  All other systems reviewed and are negative.   Physical Exam Updated Vital Signs BP (!) 157/93 (BP Location: Left Arm)   Pulse 91   Temp 99.2 F (37.3 C) (Oral)   Resp 17   Ht 5\' 9"  (1.753 m)   Wt (!) 162 kg   SpO2 99%   BMI 52.74 kg/m  Physical Exam Vitals and nursing note reviewed.  Constitutional:      Appearance: He is well-developed.  HENT:     Head: Atraumatic.  Cardiovascular:     Rate and Rhythm: Normal rate.   Pulmonary:     Effort: Pulmonary effort is normal.  Musculoskeletal:        General: Tenderness present.     Cervical back: Neck supple.     Comments: Pt has TTP of the left great toe.  Patient has mild edema over the distal foot and also medial ankle.  Patient is able to plantar and dorsiflex without any difficulty and there is no tenderness around the malleoli.  Skin:    General: Skin is warm.  Neurological:     Mental Status: He is alert and oriented to person, place, and time.     ED Results / Procedures / Treatments   Labs (all labs ordered are listed, but only abnormal results are displayed) Labs Reviewed - No data to display  EKG None  Radiology DG Foot Complete Right  Result Date: 03/16/2023 CLINICAL DATA:  Trauma, pain EXAM: RIGHT FOOT COMPLETE - 3+ VIEW COMPARISON:  01/05/2023 FINDINGS: There is radiolucent line in the anterolateral aspect of calcaneus which was not evident on 01/05/2023. There is 3 mm separation of fracture fragments. Rest of the bony structures are unremarkable. There is soft tissue swelling over the dorsal and lateral aspects. IMPRESSION: There is slightly displaced recent fracture in the anterolateral aspect of left calcaneus. Electronically Signed   By: Ernie Avena M.D.   On: 03/16/2023 11:29    Procedures Procedures    Medications Ordered in ED Medications - No data to display  ED Course/ Medical Decision Making/ A&P                             Medical Decision Making Amount and/or Complexity of Data Reviewed Radiology: ordered.  Risk OTC drugs.   Pt comes in with cc of toe pain. Recent blunt trauma.  Differential includes toe contusion, toe fracture, foot sprain, ligamentous injury.   Plan is to get xray of the toe/foot. Xray independently interpreted.  There is no evidence of any fracture around the toe.  There is no evidence of any callus formation.  Patient does have lateral foot fracture that is appreciated.  Per  radiologist, patient has calcaneal fracture.  Patient placed in a cam walker boot and advised to follow-up with orthopedic doctors.  Final Clinical Impression(s) / ED Diagnoses Final diagnoses:  Right foot pain  Contusion of right great toe without damage to nail, initial encounter  Closed displaced fracture of right calcaneus, unspecified portion of calcaneus, initial encounter    Rx / DC Orders  ED Discharge Orders          Ordered    acetaminophen (TYLENOL) 500 MG tablet  Every 6 hours PRN        03/16/23 1205              Derwood Kaplan, MD 03/16/23 1205

## 2023-03-16 NOTE — Discharge Instructions (Signed)
Please call the orthopedic doctor today to set up an appointment in about 10 days.  Take Tylenol for pain control. Make sure the cam walker boot is on at all times.

## 2023-03-16 NOTE — Patient Instructions (Addendum)
Nice to see you again  Hopefully when you start the riociguat you will see some improvement in breathing.  At the very least it is protective, keeping the right heart strong as we can.  [facility] [address] [place], [place] [address] Phone: [phone]  Return to clinic in 3 months or sooner as needed with Dr. Judeth Horn

## 2023-03-18 ENCOUNTER — Inpatient Hospital Stay: Payer: BC Managed Care – PPO

## 2023-03-18 ENCOUNTER — Telehealth: Payer: Self-pay | Admitting: Hematology and Oncology

## 2023-03-18 ENCOUNTER — Inpatient Hospital Stay: Payer: BC Managed Care – PPO | Admitting: Hematology and Oncology

## 2023-03-18 ENCOUNTER — Other Ambulatory Visit: Payer: Self-pay | Admitting: Hematology and Oncology

## 2023-03-18 DIAGNOSIS — I2699 Other pulmonary embolism without acute cor pulmonale: Secondary | ICD-10-CM

## 2023-03-18 DIAGNOSIS — D500 Iron deficiency anemia secondary to blood loss (chronic): Secondary | ICD-10-CM

## 2023-03-18 NOTE — Progress Notes (Unsigned)
Reschedule

## 2023-03-19 ENCOUNTER — Ambulatory Visit: Payer: BC Managed Care – PPO | Attending: Family Medicine | Admitting: Pharmacist

## 2023-03-19 DIAGNOSIS — I82512 Chronic embolism and thrombosis of left femoral vein: Secondary | ICD-10-CM | POA: Diagnosis not present

## 2023-03-19 LAB — POCT INR: POC INR: 3

## 2023-03-22 ENCOUNTER — Encounter: Payer: Self-pay | Admitting: Family Medicine

## 2023-03-25 NOTE — Progress Notes (Signed)
[facility] Telephone:([phone]) [phone]   [phone]  PROGRESS NOTE  Patient Care Team: Hoy Register, MD as PCP - General (Family Medicine) Chilton Si, MD as PCP - Cardiology (Cardiology)  CHIEF COMPLAINTS/PURPOSE OF CONSULTATION:  History of bilateral pulmonary emboli and left lower extremity DVT  HISTORY OF PRESENTING ILLNESS:  William Moss 37 y.o. male returns for a follow up for history of bilateral pulmonary emboli and left lower extremity DVT. He continues on anticoagulation therapy with coumadin.   [name] reports he did not pick up the iron pills after her last visit has not been taking any iron.  He notes however his energy levels are quite good.  He notes a about a 7 or 8 out of 10.  He is not having any lightheadedness, dizziness, shortness of breath.  He notes that he is try to cut back on red meat.  He has not had any overt bleeding anywhere other than some bright red blood on the toilet paper.  He notes that he has not had any dark, sticky, or tarry stools.  He continues to take his Coumadin reports his INR has been within the therapeutic range.  He is not having any signs or symptoms concerning for recurrent VTE.  He denies fevers, chills, night sweats, chest pain, cough, lower extremity edema, neuropathy or other skin changes.  He has no other complaints.  Rest of the 10 point ROS is below.  MEDICAL HISTORY:  Past Medical History:  Diagnosis Date   Atypical chest pain 05/27/2016   a. 05/2016: NST showing small area of moderare anteroapical ischemia b. 05/2016: cath showing tortous but normal cors which confirmed a false positive NST.   Diabetes mellitus without complication (HCC)    GERD (gastroesophageal reflux disease) 10/02/2016   Hypertension    Pulmonary embolism, bilateral (HCC)     SURGICAL HISTORY: Past Surgical History:  Procedure Laterality Date   CARDIAC CATHETERIZATION N/A 06/10/2016   Procedure: Left Heart Cath and  Coronary Angiography;  Surgeon: Iran Ouch, MD;  Location: [facility] INVAS[facility];  Service: Cardiovascular;  Laterality: N/A;   COLONOSCOPY N/A 02/04/2016   Procedure: COLONOSCOPY;  Surgeon: Hilarie Fredrickson, MD;  Location: [facility] ENDOSCOPY;  Service: Endoscopy;  Laterality: N/A;   NO PAST SURGERIES      SOCIAL HISTORY: Social History   Socioeconomic History   Marital status: Single    Spouse name: Not on file   Number of children: 0   Years of education: Not on file   Highest education level: Some college, no degree  Occupational History   Occupation: call center rep   Occupation: call center  Tobacco Use   Smoking status: Never   Smokeless tobacco: Never  Vaping Use   Vaping status: Not on file  Substance and Sexual Activity   Alcohol use: No   Drug use: No   Sexual activity: Not Currently  Other Topics Concern   Not on file  Social History Narrative   Not on file   Social Determinants of Health   Financial Resource Strain: Low Risk  (12/08/2022)   Overall Financial Resource Strain (CARDIA)    Difficulty of Paying Living Expenses: Not very hard  Food Insecurity: Food Insecurity Present (12/08/2022)   Hunger Vital Sign    Worried About Running Out of Food in the Last Year: Sometimes true    Ran Out of Food in the Last Year: Sometimes true  Transportation Needs: No Transportation Needs (12/08/2022)  PRAPARE - Administrator, Civil Service (Medical): No    Lack of Transportation (Non-Medical): No  Physical Activity: Insufficiently Active (12/08/2022)   Exercise Vital Sign    Days of Exercise per Week: 3 days    Minutes of Exercise per Session: 30 min  Stress: Stress Concern Present (12/08/2022)   [employer] of Occupational Health - Occupational Stress Questionnaire    Feeling of Stress : To some extent  Social Connections: Moderately Isolated (12/08/2022)   Social Connection and Isolation Panel [NHANES]    Frequency of Communication with Friends and Family: More  than three times a week    Frequency of Social Gatherings with Friends and Family: Once a week    Attends Religious Services: More than 4 times per year    Active Member of [employer] or Organizations: No    Attends Engineer, structural: Not on file    Marital Status: Never married  Catering manager Violence: Not on file    FAMILY HISTORY: Family History  Problem Relation Age of Onset   Diabetes Mother    Diabetes Father    Pulmonary embolism Father    Cancer Maternal Grandmother    Alzheimer's disease Paternal Grandmother    Breast cancer Cousin    Colon cancer Neg Hx    Esophageal cancer Neg Hx    Stomach cancer Neg Hx     ALLERGIES:  is allergic to lisinopril-hydrochlorothiazide.  MEDICATIONS:  Current Outpatient Medications  Medication Sig Dispense Refill   acetaminophen (TYLENOL) 500 MG tablet Take 1 tablet (500 mg total) by mouth every 6 (six) hours as needed. 30 tablet 0   amLODipine (NORVASC) 10 MG tablet Take 1 tablet (10 mg total) by mouth daily. 90 tablet 1   atorvastatin (LIPITOR) 40 MG tablet Take 1 tablet (40 mg total) by mouth daily. 90 tablet 1   Blood Glucose Monitoring Suppl (ONETOUCH VERIO REFLECT) w/Device KIT Check blood sugar TID E11.69 1 kit 0   carvedilol (COREG) 25 MG tablet Take 1 tablet (25 mg total) by mouth 2 (two) times daily. 180 tablet 1   Cyanocobalamin (B-12) 1000 MCG CAPS Take 1 tablet daily 90 capsule 1   ferrous sulfate 325 (65 FE) MG EC tablet Take 1 tablet (325 mg total) by mouth daily. Take with a source of Vitamin C 90 tablet 1   folic acid (FOLVITE) 1 MG tablet Take 1 tablet (1 mg total) by mouth daily. 90 tablet 1   hydrALAZINE (APRESOLINE) 50 MG tablet Take 1 tablet (50 mg total) by mouth every 8 (eight) hours. 270 tablet 1   hydrocortisone (ANUSOL-HC) 25 MG suppository Place 1 suppository (25 mg total) rectally at bedtime. 30 suppository 3   Insulin Pen Needle (BD PEN NEEDLE NANO U/F) 32G X 4 MM MISC 10 Units by Does not apply  route daily. 100 each 3   Lancets (ONETOUCH DELICA PLUS LANCET33G) MISC CHECK BLOOD SUGAR 3 TIMES A DAY 100 each 2   metFORMIN (GLUCOPHAGE) 500 MG tablet Take 2 tablets (1,000 mg total) by mouth 2 (two) times daily with a meal. 360 tablet 1   ondansetron (ZOFRAN) 4 MG tablet Take 1 tablet (4 mg total) by mouth every 8 (eight) hours as needed for nausea or vomiting. 4 tablet 0   Riociguat (ADEMPAS) 1 MG TABS Rx # 1: Take 1 tab by mouth three times daily x 2 weeks. If systolic BP > 95, then increase to 1.5 tabs three times daily x 2 weeks  105 tablet 0   Riociguat (ADEMPAS) 2 MG TABS Rx # 2: Take 1 tab by mouth three times daily x 2 weeks 42 tablet 0   tirzepatide (MOUNJARO) 12.5 MG/0.5ML Pen Inject 12.5 mg into the skin once a week. 6 mL 6   warfarin (COUMADIN) 10 MG tablet 10 mg Q Mon-Fri and 1/2 tab Q Sat and Sun 90 tablet 1   Current Facility-Administered Medications  Medication Dose Route Frequency Provider Last Rate Last Admin   dexamethasone (DECADRON) injection 4 mg  4 mg Intra-articular Once Louann Sjogren, DPM        REVIEW OF SYSTEMS:   Constitutional: ( - ) fevers, ( - )  chills , ( - ) night sweats Eyes: ( - ) blurriness of vision, ( - ) double vision, ( - ) watery eyes Ears, nose, mouth, throat, and face: ( - ) mucositis, ( - ) sore throat Respiratory: ( - ) cough, ( + ) dyspnea, ( - ) wheezes Cardiovascular: ( - ) palpitation, ( - ) chest discomfort, ( - ) lower extremity swelling Gastrointestinal:  ( - ) nausea, ( - ) heartburn, ( - ) change in bowel habits Skin: ( - ) abnormal skin rashes Lymphatics: ( - ) new lymphadenopathy, ( - ) easy bruising Neurological: ( - ) numbness, ( - ) tingling, ( - ) new weaknesses Behavioral/Psych: ( - ) mood change, ( - ) new changes  All other systems were reviewed with the patient and are negative.  PHYSICAL EXAMINATION: ECOG PERFORMANCE STATUS: 1 - Symptomatic but completely ambulatory  There were no vitals filed for this  visit.   There were no vitals filed for this visit.    GENERAL: well appearing male in NAD, obese SKIN: skin color, texture, turgor are normal, no rashes or significant lesions EYES: conjunctiva are pink and non-injected, sclera clear LUNGS: clear to auscultation and percussion with normal breathing effort HEART: regular rate & rhythm and no murmurs and no lower extremity edema Musculoskeletal: no cyanosis of digits and no clubbing  PSYCH: alert & oriented x 3, fluent speech NEURO: no focal motor/sensory deficits  LABORATORY DATA:  I have reviewed the data as listed    Latest Ref Rng & Units 12/17/2022    8:22 AM 10/29/2022    8:53 AM 06/17/2022    3:19 PM  CBC  WBC 4.0 - 10.5 K/uL 7.9  6.5  6.9   Hemoglobin 13.0 - 17.0 g/dL 61.6  07.3  71.0   Hematocrit 39.0 - 52.0 % 33.8  34.6  37.7   Platelets 150 - 400 K/uL 263  278  228        Latest Ref Rng & Units 12/17/2022    8:22 AM 09/18/2022    9:00 AM 06/17/2022    3:19 PM  CMP  Glucose 70 - 99 mg/dL 93  99  626   BUN 6 - 20 mg/dL 23  14  19    Creatinine 0.61 - 1.24 mg/dL 9.48  5.46  2.70   Sodium 135 - 145 mmol/L 138  140  139   Potassium 3.5 - 5.1 mmol/L 4.3  4.4  4.2   Chloride 98 - 111 mmol/L 108  107  110   CO2 22 - 32 mmol/L 24  20  23    Calcium 8.9 - 10.3 mg/dL 9.0  9.0  8.9   Total Protein 6.5 - 8.1 g/dL 7.4  6.9  7.5   Total Bilirubin 0.3 - 1.2 mg/dL 0.3  0.2  <0.1   Alkaline Phos 38 - 126 U/L 82  72  65   AST 15 - 41 U/L 10  13  19    ALT 0 - 44 U/L 13  18  28      ASSESSMENT & PLAN CAEDAN SUMLER is a 37 y.o. male who returns for a follow up for history of bilateral pulmonary emboli and left lower extremity DVT.   #Bilateral PE and left leg DVT: --Likely provoking factors include pneumonia and prolonged immobility due to sedentary lifestyle.  --Currently on coumadin, INR in therapeutic range.  --Hypercoagulable workup from November 2022 was unremarkable. --Patient continues to be sedentary and has evidence of  residual pulmonary embolism seen on NM perfusion scan from February 2023. He is following with [facility] for consideration of CTEPH therapies --Recommend to continue on anticoagulation in the setting of continued sedentary lifestyle, persistent dyspnea on exertion and residual PE.  --Labs today show white blood cell count 6.3, hemoglobin 1.0, MCV 76.8, and platelets of 246 --RTC in 3 months with labs (normal q 6 months but returning for anemia)  #Microcytic Anemia, Worsening -- Hemoglobin 11.0 -- Patient is not currently taking his folic acid or iron pills.  Encouraged him to pick them up and start these today.  #Plantar Fascitis -- Patient wakes up in the morning with pain in his right foot that improves throughout the day. -- Findings sound consistent with plantar fasciitis. -- Recommend conservative methods such as morning stretches with a tennis ball/baseball or water bottles.  #Cord-like nodularity involving R upper extremity-resolved: --PICC line was placed on R upper extremity during recent hospitalization --Doppler ultrasound of the right upper extremity from 08/13/2021 showed no evidence of DVT in the R upper extremity.   No orders of the defined types were placed in this encounter.   All questions were answered. The patient knows to call the clinic with any problems, questions or concerns.  I have spent a total of 30 minutes minutes of face-to-face and non-face-to-face time, preparing to see the patient, performing a medically appropriate examination, counseling and educating the patient, ordering tests, documenting clinical information in the electronic health record,  and care coordination.   William Barns, MD Department of Hematology/Oncology [facility] at [facility] Phone: [phone] Pager: [phone] Email: [email or web address]

## 2023-03-26 ENCOUNTER — Inpatient Hospital Stay (HOSPITAL_BASED_OUTPATIENT_CLINIC_OR_DEPARTMENT_OTHER): Payer: BC Managed Care – PPO | Admitting: Hematology and Oncology

## 2023-03-26 ENCOUNTER — Inpatient Hospital Stay: Payer: BC Managed Care – PPO | Attending: Hematology and Oncology

## 2023-03-26 ENCOUNTER — Other Ambulatory Visit: Payer: Self-pay

## 2023-03-26 VITALS — BP 147/86 | HR 82 | Temp 97.2°F | Resp 16 | Wt 363.3 lb

## 2023-03-26 DIAGNOSIS — D500 Iron deficiency anemia secondary to blood loss (chronic): Secondary | ICD-10-CM

## 2023-03-26 DIAGNOSIS — Z86718 Personal history of other venous thrombosis and embolism: Secondary | ICD-10-CM | POA: Insufficient documentation

## 2023-03-26 DIAGNOSIS — M722 Plantar fascial fibromatosis: Secondary | ICD-10-CM | POA: Insufficient documentation

## 2023-03-26 DIAGNOSIS — Z803 Family history of malignant neoplasm of breast: Secondary | ICD-10-CM | POA: Insufficient documentation

## 2023-03-26 DIAGNOSIS — I2699 Other pulmonary embolism without acute cor pulmonale: Secondary | ICD-10-CM

## 2023-03-26 DIAGNOSIS — Z86711 Personal history of pulmonary embolism: Secondary | ICD-10-CM | POA: Diagnosis not present

## 2023-03-26 DIAGNOSIS — D509 Iron deficiency anemia, unspecified: Secondary | ICD-10-CM | POA: Insufficient documentation

## 2023-03-26 DIAGNOSIS — Z7901 Long term (current) use of anticoagulants: Secondary | ICD-10-CM | POA: Diagnosis not present

## 2023-03-26 LAB — CMP (CANCER CENTER ONLY)
ALT: 15 U/L (ref 0–44)
AST: 11 U/L — ABNORMAL LOW (ref 15–41)
Albumin: 3.8 g/dL (ref 3.5–5.0)
Alkaline Phosphatase: 78 U/L (ref 38–126)
Anion gap: 6 (ref 5–15)
BUN: 20 mg/dL (ref 6–20)
CO2: 22 mmol/L (ref 22–32)
Calcium: 8.9 mg/dL (ref 8.9–10.3)
Chloride: 111 mmol/L (ref 98–111)
Creatinine: 1.41 mg/dL — ABNORMAL HIGH (ref 0.61–1.24)
GFR, Estimated: >60 mL/min (ref >60)
Glucose, Bld: 92 mg/dL (ref 70–99)
Potassium: 4.3 mmol/L (ref 3.5–5.1)
Sodium: 139 mmol/L (ref 135–145)
Total Bilirubin: 0.3 mg/dL (ref 0.3–1.2)
Total Protein: 6.8 g/dL (ref 6.5–8.1)

## 2023-03-26 LAB — CBC WITH DIFFERENTIAL (CANCER CENTER ONLY)
Abs Immature Granulocytes: 0.01 10*3/uL (ref 0.00–0.07)
Basophils Absolute: 0 10*3/uL (ref 0.0–0.1)
Basophils Relative: 0 %
Eosinophils Absolute: 0.2 10*3/uL (ref 0.0–0.5)
Eosinophils Relative: 4 %
HCT: 35 % — ABNORMAL LOW (ref 39.0–52.0)
Hemoglobin: 11 g/dL — ABNORMAL LOW (ref 13.0–17.0)
Immature Granulocytes: 0 %
Lymphocytes Relative: 29 %
Lymphs Abs: 1.8 10*3/uL (ref 0.7–4.0)
MCH: 24.1 pg — ABNORMAL LOW (ref 26.0–34.0)
MCHC: 31.4 g/dL (ref 30.0–36.0)
MCV: 76.8 fL — ABNORMAL LOW (ref 80.0–100.0)
Monocytes Absolute: 0.6 10*3/uL (ref 0.1–1.0)
Monocytes Relative: 10 %
Neutro Abs: 3.6 10*3/uL (ref 1.7–7.7)
Neutrophils Relative %: 57 %
Platelet Count: 246 10*3/uL (ref 150–400)
RBC: 4.56 MIL/uL (ref 4.22–5.81)
RDW: 17.5 % — ABNORMAL HIGH (ref 11.5–15.5)
WBC Count: 6.3 10*3/uL (ref 4.0–10.5)
nRBC: 0 % (ref 0.0–0.2)

## 2023-03-26 LAB — FERRITIN: Ferritin: 4 ng/mL — ABNORMAL LOW (ref 24–336)

## 2023-03-26 LAB — RETIC PANEL
Immature Retic Fract: 33.6 % — ABNORMAL HIGH (ref 2.3–15.9)
RBC.: 4.52 MIL/uL (ref 4.22–5.81)
Retic Count, Absolute: 74.1 10*3/uL (ref 19.0–186.0)
Retic Ct Pct: 1.6 % (ref 0.4–3.1)
Reticulocyte Hemoglobin: 27 pg — ABNORMAL LOW (ref >27.9)

## 2023-03-26 LAB — IRON AND IRON BINDING CAPACITY (CC-WL,HP ONLY)
Iron: 36 ug/dL — ABNORMAL LOW (ref 45–182)
Saturation Ratios: 11 % — ABNORMAL LOW (ref 17.9–39.5)
TIBC: 342 ug/dL (ref 250–450)
UIBC: 306 ug/dL (ref 117–376)

## 2023-03-26 LAB — FOLATE: Folate: 11.1 ng/mL (ref >5.9)

## 2023-03-26 LAB — VITAMIN B12: Vitamin B-12: 139 pg/mL — ABNORMAL LOW (ref 180–914)

## 2023-03-26 MED ORDER — FERROUS SULFATE 325 (65 FE) MG PO TBEC: 325.0000 mg | DELAYED_RELEASE_TABLET | Freq: Every day | ORAL | 1 refills | Status: AC

## 2023-03-26 MED ORDER — FOLIC ACID 1 MG PO TABS: 1.0000 mg | ORAL_TABLET | Freq: Every day | ORAL | 1 refills | Status: AC

## 2023-03-29 ENCOUNTER — Telehealth: Payer: Self-pay | Admitting: *Deleted

## 2023-03-29 NOTE — Telephone Encounter (Signed)
TCT patient regarding recent lab results. Spoke with patient. Advised that his labs are consistent with iron deficiency anemia. Please assure that he has picked up the iron pills prescribed to him. Will plan to see him back in 3 months time in order to assure the iron pills are working like they should.  Patient states his insurance would not pay for his po iron. Advised that it is really an over the counter medication. Advised that he can check with his pharmacy to see which brand is the least expensive. Pt voiced understanding. He is aware of his appt in 3 months.

## 2023-03-29 NOTE — Telephone Encounter (Signed)
-----   Message from Ulysees Barns IV sent at 03/28/2023  5:30 PM EDT ----- Please let Mr. Calamia know that his labs are consistent with iron deficiency anemia.  Please assure that he has picked up the iron pills prescribed to him.  Will plan to see him back in 3 months time in order to assure the iron pills are working like they should. ----- Message ----- From: Leory Plowman, Lab In [place] Sent: 03/26/2023   8:51 AM EDT To: Jaci Standard, MD

## 2023-03-31 ENCOUNTER — Encounter: Payer: Self-pay | Admitting: Podiatry

## 2023-03-31 ENCOUNTER — Ambulatory Visit: Payer: BC Managed Care – PPO | Admitting: Podiatry

## 2023-03-31 DIAGNOSIS — S92021A Displaced fracture of anterior process of right calcaneus, initial encounter for closed fracture: Secondary | ICD-10-CM | POA: Diagnosis not present

## 2023-03-31 NOTE — Progress Notes (Signed)
  Subjective:  Patient ID: William Moss, male    DOB: 1985-09-05,   MRN: [id number]  Chief Complaint  Patient presents with   Plantar Fasciitis    "Check on my right foot"  Rolled her right ankle a few days ago, went to ER,xrays in epic    37 y.o. male presents for concern of right ankle injury. Rolled ankle about two weeks ago and was seen in the ER. X-rays were taken. Patient was placed in a CAM walker and advised to follow-up. . Denies any other pedal complaints. Denies n/v/f/c.   Past Medical History:  Diagnosis Date   Atypical chest pain 05/27/2016   a. 05/2016: NST showing small area of moderare anteroapical ischemia b. 05/2016: cath showing tortous but normal cors which confirmed a false positive NST.   Diabetes mellitus without complication (HCC)    GERD (gastroesophageal reflux disease) 10/02/2016   Hypertension    Pulmonary embolism, bilateral (HCC)     Objective:  Physical Exam: Vascular: DP/PT pulses 2/4 bilateral. CFT <3 seconds. Normal hair growth on digits. No edema.  Skin. No lacerations or abrasions bilateral feet.  Musculoskeletal: MMT 5/5 bilateral lower extremities in DF, PF, Inversion and Eversion. Deceased ROM in DF of ankle joint. Tender over the anterior latearl area of the calcaneus upon palpation but tolerable for patient. No pain around great toe.  Neurological: Sensation intact to light touch.   Assessment:   1. Closed displaced fracture of anterior process of right calcaneus, initial encounter      Plan:  Patient was evaluated and treated and all questions answered. -Xrays reviewed. Displaced fracture of anterior process of calcaneus.  -Discussed treatement options for calcaneal  fracture; risks, alternatives, and benefits explained. -Continue CAM boot . Patient to wear at all times and instructed on use -Recommend protection, rest, ice, elevation daily until symptoms improve -Antinflammatories as needed -Patient to return to office in 3 weeks  for serial x-rays to assess healing  or sooner if condition worsens.   Louann Sjogren, DPM

## 2023-04-07 ENCOUNTER — Telehealth: Payer: Self-pay | Admitting: Pulmonary Disease

## 2023-04-07 NOTE — Telephone Encounter (Signed)
William Moss from [place] support states [facility] was unable to contact the patient, confirmed [phone]- so the file was closed. If we are able to get in touch with the patient, the file can be reopened. Call back is [phone].

## 2023-04-09 NOTE — Telephone Encounter (Signed)
ATC patient regarding Adempas. Unable to reach. Left VM. MyChart message has also been sent since pt appears to be active on myChart.  Chesley Mires, PharmD, MPH, BCPS, CPP Clinical Pharmacist (Rheumatology and Pulmonology)

## 2023-04-12 ENCOUNTER — Other Ambulatory Visit: Payer: Self-pay

## 2023-04-14 ENCOUNTER — Telehealth: Payer: Self-pay | Admitting: Pulmonary Disease

## 2023-04-14 ENCOUNTER — Encounter: Payer: Self-pay | Admitting: Family Medicine

## 2023-04-14 NOTE — Telephone Encounter (Signed)
William Moss from [facility] needs RX for Adempas. Needs for shipment. William Moss phone number is [phone].

## 2023-04-15 ENCOUNTER — Other Ambulatory Visit: Payer: Self-pay

## 2023-04-15 NOTE — Telephone Encounter (Signed)
Returned call to [employer]. Per rep, patient's profile was inactivated on 04/05/2023 due to lack of contact with patient. They need verbal to get patient restarted. Provided consent to do so. They will triage to pharmacy and let clinic know if any further information is needed from our end thereafter.  Specialty pharmacy- [facility]   Phone [phone]  Chesley Mires, PharmD, MPH, BCPS, CPP Clinical Pharmacist (Rheumatology and Pulmonology)\

## 2023-04-16 ENCOUNTER — Other Ambulatory Visit: Payer: Self-pay

## 2023-04-21 ENCOUNTER — Ambulatory Visit: Payer: BC Managed Care – PPO | Admitting: Podiatry

## 2023-04-21 ENCOUNTER — Encounter: Payer: Self-pay | Admitting: Podiatry

## 2023-04-21 ENCOUNTER — Ambulatory Visit (INDEPENDENT_AMBULATORY_CARE_PROVIDER_SITE_OTHER): Payer: BC Managed Care – PPO

## 2023-04-21 DIAGNOSIS — S92021D Displaced fracture of anterior process of right calcaneus, subsequent encounter for fracture with routine healing: Secondary | ICD-10-CM

## 2023-04-21 DIAGNOSIS — S92021A Displaced fracture of anterior process of right calcaneus, initial encounter for closed fracture: Secondary | ICD-10-CM

## 2023-04-21 NOTE — Progress Notes (Signed)
  Subjective:  Patient ID: William Moss, male    DOB: May 08, 1986,   MRN: [id number]  Chief Complaint  Patient presents with   Fracture    foot fracture need xrays    37 y.o. male presents for follow-up of right anterior process of calcaneus fracture. Relates doing well and has been in CAM boot. Has tried regular shoes and does well in them.  . Denies any other pedal complaints. Denies n/v/f/c.   Past Medical History:  Diagnosis Date   Atypical chest pain 05/27/2016   a. 05/2016: NST showing small area of moderare anteroapical ischemia b. 05/2016: cath showing tortous but normal cors which confirmed a false positive NST.   Diabetes mellitus without complication (HCC)    GERD (gastroesophageal reflux disease) 10/02/2016   Hypertension    Pulmonary embolism, bilateral (HCC)     Objective:  Physical Exam: Vascular: DP/PT pulses 2/4 bilateral. CFT <3 seconds. Normal hair growth on digits. No edema.  Skin. No lacerations or abrasions bilateral feet.  Musculoskeletal: MMT 5/5 bilateral lower extremities in DF, PF, Inversion and Eversion. Deceased ROM in DF of ankle joint. Non-tender over the anterior latearl area of the calcaneus upon palpation . No pain around great toe.  Neurological: Sensation intact to light touch.   Assessment:   1. Closed displaced fracture of anterior process of right calcaneus with routine healing, subsequent encounter       Plan:  Patient was evaluated and treated and all questions answered. -Xrays reviewed. Minimally displaced fracture of anterior process of calcaneus. Some callus formation and healing noted.  -Discussed treatement options for calcaneal  fracture; risks, alternatives, and benefits explained. -WBAT in regular shoes.  -Recommend protection, rest, ice, elevation daily until symptoms improve -Antinflammatories as needed -Patient to return to office as needed. Discharged from fracture standpoint.    Louann Sjogren, DPM

## 2023-04-23 ENCOUNTER — Encounter: Payer: Self-pay | Admitting: Family Medicine

## 2023-04-23 ENCOUNTER — Ambulatory Visit: Payer: BC Managed Care – PPO | Attending: Family Medicine | Admitting: Pharmacist

## 2023-04-23 DIAGNOSIS — I82512 Chronic embolism and thrombosis of left femoral vein: Secondary | ICD-10-CM

## 2023-04-23 LAB — POCT INR: POC INR: 3.8

## 2023-05-12 ENCOUNTER — Other Ambulatory Visit: Payer: Self-pay

## 2023-05-23 ENCOUNTER — Other Ambulatory Visit: Payer: Self-pay | Admitting: Family Medicine

## 2023-05-23 DIAGNOSIS — E1122 Type 2 diabetes mellitus with diabetic chronic kidney disease: Secondary | ICD-10-CM

## 2023-05-23 DIAGNOSIS — I152 Hypertension secondary to endocrine disorders: Secondary | ICD-10-CM

## 2023-05-27 ENCOUNTER — Encounter: Payer: Self-pay | Admitting: Family Medicine

## 2023-05-28 ENCOUNTER — Encounter: Payer: Self-pay | Admitting: Pharmacist

## 2023-05-28 NOTE — Progress Notes (Unsigned)
Appointment made for 9/30 at 8:30am.

## 2023-05-31 ENCOUNTER — Ambulatory Visit: Payer: BC Managed Care – PPO | Attending: Family Medicine | Admitting: Pharmacist

## 2023-05-31 DIAGNOSIS — I82512 Chronic embolism and thrombosis of left femoral vein: Secondary | ICD-10-CM

## 2023-05-31 LAB — POCT INR: POC INR: 3.5

## 2023-06-03 ENCOUNTER — Ambulatory Visit: Payer: BC Managed Care – PPO | Admitting: Pharmacist

## 2023-06-08 ENCOUNTER — Other Ambulatory Visit: Payer: Self-pay | Admitting: Family Medicine

## 2023-06-08 DIAGNOSIS — I82512 Chronic embolism and thrombosis of left femoral vein: Secondary | ICD-10-CM

## 2023-06-10 ENCOUNTER — Other Ambulatory Visit: Payer: Self-pay

## 2023-06-22 ENCOUNTER — Ambulatory Visit: Payer: BC Managed Care – PPO | Admitting: Family Medicine

## 2023-06-24 ENCOUNTER — Telehealth: Payer: Self-pay | Admitting: Hematology and Oncology

## 2023-06-25 ENCOUNTER — Ambulatory Visit: Payer: BC Managed Care – PPO | Admitting: Hematology and Oncology

## 2023-06-25 ENCOUNTER — Other Ambulatory Visit: Payer: BC Managed Care – PPO

## 2023-06-29 ENCOUNTER — Ambulatory Visit: Payer: BC Managed Care – PPO | Admitting: Pharmacist

## 2023-06-30 ENCOUNTER — Encounter: Payer: Self-pay | Admitting: Pharmacist

## 2023-06-30 ENCOUNTER — Encounter: Payer: Self-pay | Admitting: Family Medicine

## 2023-06-30 ENCOUNTER — Other Ambulatory Visit: Payer: Self-pay | Admitting: Pharmacist

## 2023-06-30 DIAGNOSIS — I82512 Chronic embolism and thrombosis of left femoral vein: Secondary | ICD-10-CM

## 2023-06-30 MED ORDER — WARFARIN SODIUM 10 MG PO TABS
ORAL_TABLET | ORAL | 2 refills | Status: DC
Start: 2023-06-30 — End: 2023-10-25

## 2023-07-01 ENCOUNTER — Other Ambulatory Visit: Payer: Self-pay

## 2023-07-03 ENCOUNTER — Other Ambulatory Visit: Payer: Self-pay | Admitting: Family Medicine

## 2023-07-03 DIAGNOSIS — E1122 Type 2 diabetes mellitus with diabetic chronic kidney disease: Secondary | ICD-10-CM

## 2023-07-05 NOTE — Telephone Encounter (Signed)
Requested Prescriptions  Pending Prescriptions Disp Refills   metFORMIN (GLUCOPHAGE) 500 MG tablet [Pharmacy Med Name: METFORMIN HCL 500 MG TABLET] 360 tablet 0    Sig: TAKE 2 TABLETS (1,000 MG TOTAL) BY MOUTH 2 (TWO) TIMES DAILY WITH A MEAL.     Endocrinology:  Diabetes - Biguanides Failed - 07/03/2023  1:34 AM      Failed - Cr in normal range and within 360 days    Creatinine  Date Value Ref Range Status  03/26/2023 1.41 (H) 0.61 - 1.24 mg/dL Final   Creat  Date Value Ref Range Status  08/28/2016 0.92 0.60 - 1.35 mg/dL Final   Creatinine, Urine  Date Value Ref Range Status  01/09/2016 361 20 - 370 mg/dL Final    Comment:    Result confirmed by automatic dilution. Result repeated and verified.          Failed - HBA1C is between 0 and 7.9 and within 180 days    HbA1c, POC (controlled diabetic range)  Date Value Ref Range Status  12/21/2022 6.0 0.0 - 7.0 % Final         Failed - B12 Level in normal range and within 720 days    Vitamin B-12  Date Value Ref Range Status  03/26/2023 139 (L) 180 - 914 pg/mL Final    Comment:    (NOTE) This assay is not validated for testing neonatal or myeloproliferative syndrome specimens for Vitamin B12 levels. Performed at [facility], [address] [place], [place] [address]          Failed - Valid encounter within last 6 months    Recent Outpatient Visits           6 months ago Type 2 diabetes mellitus with stage 2 chronic kidney disease, with long-term current use of insulin (HCC)   [place] [facility] [place], [place], MD   10 months ago Hypertension associated with diabetes [facility])   [place] [facility] [place], [place] W, NP   11 months ago Chronic deep vein thrombosis (DVT) of femoral vein of left lower extremity [facility])   [place] [facility] [place], Odette Horns, MD   11 months ago Chronic deep vein thrombosis (DVT) of femoral vein of  left lower extremity [facility])   [place] [facility] [place], Odette Horns, MD   1 year ago Acute pulmonary embolism, unspecified pulmonary embolism type, unspecified whether acute cor pulmonale present [facility])   [place] [facility] [place], Odette Horns, MD       Future Appointments             In 3 days [place], [place] [facility]            Passed - eGFR in normal range and within 360 days    GFR, Est African American  Date Value Ref Range Status  08/28/2016 >89 >=60 mL/min Final   GFR calc Af Amer  Date Value Ref Range Status  10/07/2020 62 >59 mL/min/1.73 Final    Comment:    **In accordance with recommendations from the NKF-ASN Task force,**   [facility] is in the process of updating its eGFR calculation to the   2021 CKD-EPI creatinine equation that estimates kidney function   without a race variable.    GFR, Est Non African American  Date Value Ref Range Status  08/28/2016 >89 >=60 mL/min  Final   GFR, Estimated  Date Value Ref Range Status  03/26/2023 >60 >60 mL/min Final    Comment:    (NOTE) Calculated using the CKD-EPI Creatinine Equation (2021)    eGFR  Date Value Ref Range Status  09/18/2022 70 >59 mL/min/1.73 Final         Passed - CBC within normal limits and completed in the last 12 months    WBC  Date Value Ref Range Status  09/21/2021 12.0 (H) 4.0 - 10.5 K/uL Final   WBC Count  Date Value Ref Range Status  03/26/2023 6.3 4.0 - 10.5 K/uL Final   RBC  Date Value Ref Range Status  03/26/2023 4.56 4.22 - 5.81 MIL/uL Final   RBC.  Date Value Ref Range Status  03/26/2023 4.52 4.22 - 5.81 MIL/uL Final   Hemoglobin  Date Value Ref Range Status  03/26/2023 11.0 (L) 13.0 - 17.0 g/dL Final  96/11/5407 81.1 (L) 13.0 - 17.7 g/dL Final   HCT  Date Value Ref Range Status  03/26/2023 35.0 (L) 39.0 - 52.0 % Final   Hematocrit  Date Value Ref Range Status  10/29/2022  34.6 (L) 37.5 - 51.0 % Final   MCHC  Date Value Ref Range Status  03/26/2023 31.4 30.0 - 36.0 g/dL Final   [facility]  Date Value Ref Range Status  03/26/2023 24.1 (L) 26.0 - 34.0 pg Final   MCV  Date Value Ref Range Status  03/26/2023 76.8 (L) 80.0 - 100.0 fL Final  10/29/2022 79 79 - 97 fL Final   No results found for: "PLTCOUNTKUC", "LABPLAT", "POCPLA" RDW  Date Value Ref Range Status  03/26/2023 17.5 (H) 11.5 - 15.5 % Final  10/29/2022 15.8 (H) 11.6 - 15.4 % Final

## 2023-07-07 ENCOUNTER — Other Ambulatory Visit: Payer: Self-pay | Admitting: Hematology and Oncology

## 2023-07-07 ENCOUNTER — Inpatient Hospital Stay: Payer: BC Managed Care – PPO | Attending: Hematology and Oncology

## 2023-07-07 ENCOUNTER — Inpatient Hospital Stay (HOSPITAL_BASED_OUTPATIENT_CLINIC_OR_DEPARTMENT_OTHER): Payer: BC Managed Care – PPO | Admitting: Hematology and Oncology

## 2023-07-07 VITALS — BP 151/92 | HR 70 | Temp 98.6°F | Resp 14 | Wt 346.5 lb

## 2023-07-07 DIAGNOSIS — Z86718 Personal history of other venous thrombosis and embolism: Secondary | ICD-10-CM | POA: Diagnosis not present

## 2023-07-07 DIAGNOSIS — M79671 Pain in right foot: Secondary | ICD-10-CM | POA: Insufficient documentation

## 2023-07-07 DIAGNOSIS — Z86711 Personal history of pulmonary embolism: Secondary | ICD-10-CM | POA: Insufficient documentation

## 2023-07-07 DIAGNOSIS — Z803 Family history of malignant neoplasm of breast: Secondary | ICD-10-CM | POA: Insufficient documentation

## 2023-07-07 DIAGNOSIS — D509 Iron deficiency anemia, unspecified: Secondary | ICD-10-CM | POA: Diagnosis not present

## 2023-07-07 DIAGNOSIS — D500 Iron deficiency anemia secondary to blood loss (chronic): Secondary | ICD-10-CM

## 2023-07-07 DIAGNOSIS — Z7901 Long term (current) use of anticoagulants: Secondary | ICD-10-CM | POA: Diagnosis not present

## 2023-07-07 DIAGNOSIS — I2699 Other pulmonary embolism without acute cor pulmonale: Secondary | ICD-10-CM | POA: Diagnosis not present

## 2023-07-07 LAB — CMP (CANCER CENTER ONLY)
ALT: 13 U/L (ref 0–44)
AST: 12 U/L — ABNORMAL LOW (ref 15–41)
Albumin: 3.8 g/dL (ref 3.5–5.0)
Alkaline Phosphatase: 70 U/L (ref 38–126)
Anion gap: 6 (ref 5–15)
BUN: 18 mg/dL (ref 6–20)
CO2: 22 mmol/L (ref 22–32)
Calcium: 8.9 mg/dL (ref 8.9–10.3)
Chloride: 112 mmol/L — ABNORMAL HIGH (ref 98–111)
Creatinine: 1.2 mg/dL (ref 0.61–1.24)
GFR, Estimated: >60 mL/min (ref >60)
Glucose, Bld: 85 mg/dL (ref 70–99)
Potassium: 4.1 mmol/L (ref 3.5–5.1)
Sodium: 140 mmol/L (ref 135–145)
Total Bilirubin: 0.3 mg/dL (ref <1.2)
Total Protein: 7.1 g/dL (ref 6.5–8.1)

## 2023-07-07 LAB — IRON AND IRON BINDING CAPACITY (CC-WL,HP ONLY)
Iron: 32 ug/dL — ABNORMAL LOW (ref 45–182)
Saturation Ratios: 9 % — ABNORMAL LOW (ref 17.9–39.5)
TIBC: 365 ug/dL (ref 250–450)
UIBC: 333 ug/dL (ref 117–376)

## 2023-07-07 LAB — CBC WITH DIFFERENTIAL (CANCER CENTER ONLY)
Abs Immature Granulocytes: 0.02 10*3/uL (ref 0.00–0.07)
Basophils Absolute: 0 10*3/uL (ref 0.0–0.1)
Basophils Relative: 0 %
Eosinophils Absolute: 0.1 10*3/uL (ref 0.0–0.5)
Eosinophils Relative: 2 %
HCT: 36.8 % — ABNORMAL LOW (ref 39.0–52.0)
Hemoglobin: 11.4 g/dL — ABNORMAL LOW (ref 13.0–17.0)
Immature Granulocytes: 0 %
Lymphocytes Relative: 33 %
Lymphs Abs: 1.7 10*3/uL (ref 0.7–4.0)
MCH: 24.3 pg — ABNORMAL LOW (ref 26.0–34.0)
MCHC: 31 g/dL (ref 30.0–36.0)
MCV: 78.5 fL — ABNORMAL LOW (ref 80.0–100.0)
Monocytes Absolute: 0.5 10*3/uL (ref 0.1–1.0)
Monocytes Relative: 10 %
Neutro Abs: 2.8 10*3/uL (ref 1.7–7.7)
Neutrophils Relative %: 55 %
Platelet Count: 273 10*3/uL (ref 150–400)
RBC: 4.69 MIL/uL (ref 4.22–5.81)
RDW: 17.6 % — ABNORMAL HIGH (ref 11.5–15.5)
WBC Count: 5.2 10*3/uL (ref 4.0–10.5)
nRBC: 0 % (ref 0.0–0.2)

## 2023-07-07 LAB — RETIC PANEL
Immature Retic Fract: 25.6 % — ABNORMAL HIGH (ref 2.3–15.9)
RBC.: 4.66 MIL/uL (ref 4.22–5.81)
Retic Count, Absolute: 65.7 10*3/uL (ref 19.0–186.0)
Retic Ct Pct: 1.4 % (ref 0.4–3.1)
Reticulocyte Hemoglobin: 29.1 pg (ref >27.9)

## 2023-07-07 LAB — FERRITIN: Ferritin: 5 ng/mL — ABNORMAL LOW (ref 24–336)

## 2023-07-07 LAB — VITAMIN B12: Vitamin B-12: 140 pg/mL — ABNORMAL LOW (ref 180–914)

## 2023-07-07 LAB — FOLATE: Folate: 6.5 ng/mL (ref >5.9)

## 2023-07-07 NOTE — Progress Notes (Signed)
[facility] [phone]   [phone]  PROGRESS NOTE  Patient Care Team: Hoy Register, MD as PCP - General (Family Medicine) Chilton Si, MD as PCP - Cardiology (Cardiology)  CHIEF COMPLAINTS/PURPOSE OF CONSULTATION:  History of bilateral pulmonary emboli and left lower extremity DVT  HISTORY OF PRESENTING ILLNESS:  William Moss 37 y.o. male returns for a follow up for history of bilateral pulmonary emboli and left lower extremity DVT. He continues on anticoagulation therapy with coumadin.   Ms. Hausen reports he has been all right in the interim since our last visit.  He is had no major changes in his health.  He did develop a fracture in his right foot when his foot fell asleep at the barbershop.  He tried to stand up and unfortunately twisted his leg.  He reports that it did not bruise up terribly bad.  Overall he is tolerating Coumadin well with no major bleeding, bruising, or dark stools.  He reports that he has not been able take up the iron pills or folic acid, similar to his report last time.  He reports he tried to be mindful of what he eats and try to eat more red meat.  He reports his energy is about a 6 out of 10.  He is losing weight intentionally on Mounjaro, currently down 17 pounds.   He continues to take his Coumadin reports his INR has been within the therapeutic range.  He is not having any signs or symptoms concerning for recurrent VTE.  He denies fevers, chills, night sweats, chest pain, cough, lower extremity edema, neuropathy or other skin changes.  He has no other complaints.  Rest of the 10 point ROS is below.  MEDICAL HISTORY:  Past Medical History:  Diagnosis Date   Atypical chest pain 05/27/2016   a. 05/2016: NST showing small area of moderare anteroapical ischemia b. 05/2016: cath showing tortous but normal cors which confirmed a false positive NST.   Diabetes mellitus without complication (HCC)    GERD (gastroesophageal  reflux disease) 10/02/2016   Hypertension    Pulmonary embolism, bilateral (HCC)     SURGICAL HISTORY: Past Surgical History:  Procedure Laterality Date   CARDIAC CATHETERIZATION N/A 06/10/2016   Procedure: Left Heart Cath and Coronary Angiography;  Surgeon: Iran Ouch, MD;  Location: [facility];  Service: Cardiovascular;  Laterality: N/A;   COLONOSCOPY N/A 02/04/2016   Procedure: COLONOSCOPY;  Surgeon: Hilarie Fredrickson, MD;  Location: [facility] ENDOSCOPY;  Service: Endoscopy;  Laterality: N/A;   NO PAST SURGERIES      SOCIAL HISTORY: Social History   Socioeconomic History   Marital status: Single    Spouse name: Not on file   Number of children: 0   Years of education: Not on file   Highest education level: Some college, no degree  Occupational History   Occupation: call center rep   Occupation: call center  Tobacco Use   Smoking status: Never   Smokeless tobacco: Never  Vaping Use   Vaping status: Not on file  Substance and Sexual Activity   Alcohol use: No   Drug use: No   Sexual activity: Not Currently  Other Topics Concern   Not on file  Social History Narrative   Not on file   Social Determinants of Health   Financial Resource Strain: Medium Risk (07/05/2023)   Overall Financial Resource Strain (CARDIA)    Difficulty of Paying Living Expenses: Somewhat hard  Food Insecurity: Food  Insecurity Present (07/05/2023)   Hunger Vital Sign    Worried About Running Out of Food in the Last Year: Never true    Ran Out of Food in the Last Year: Sometimes true  Transportation Needs: No Transportation Needs (07/05/2023)   PRAPARE - Administrator, Civil Service (Medical): No    Lack of Transportation (Non-Medical): No  Physical Activity: Insufficiently Active (07/05/2023)   Exercise Vital Sign    Days of Exercise per Week: 2 days    Minutes of Exercise per Session: 20 min  Stress: Stress Concern Present (07/05/2023)   [employer] of Occupational Health -  Occupational Stress Questionnaire    Feeling of Stress : Rather much  Social Connections: Moderately Isolated (07/05/2023)   Social Connection and Isolation Panel [NHANES]    Frequency of Communication with Friends and Family: More than three times a week    Frequency of Social Gatherings with Friends and Family: Twice a week    Attends Religious Services: More than 4 times per year    Active Member of [employer] or Organizations: No    Attends Engineer, structural: Not on file    Marital Status: Never married  Catering manager Violence: Not on file    FAMILY HISTORY: Family History  Problem Relation Age of Onset   Diabetes Mother    Diabetes Father    Pulmonary embolism Father    Cancer Maternal Grandmother    Alzheimer's disease Paternal Grandmother    Breast cancer Cousin    Colon cancer Neg Hx    Esophageal cancer Neg Hx    Stomach cancer Neg Hx     ALLERGIES:  is allergic to lisinopril-hydrochlorothiazide.  MEDICATIONS:  Current Outpatient Medications  Medication Sig Dispense Refill   acetaminophen (TYLENOL) 500 MG tablet Take 1 tablet (500 mg total) by mouth every 6 (six) hours as needed. 30 tablet 0   amLODipine (NORVASC) 10 MG tablet Take 1 tablet (10 mg total) by mouth daily. 90 tablet 1   atorvastatin (LIPITOR) 40 MG tablet TAKE 1 TABLET BY MOUTH EVERY DAY 90 tablet 1   Blood Glucose Monitoring Suppl (ONETOUCH VERIO REFLECT) w/Device KIT Check blood sugar TID E11.69 1 kit 0   carvedilol (COREG) 25 MG tablet Take 1 tablet (25 mg total) by mouth 2 (two) times daily. 180 tablet 1   Cyanocobalamin (B-12) 1000 MCG CAPS Take 1 tablet daily 90 capsule 1   ferrous sulfate 325 (65 FE) MG EC tablet Take 1 tablet (325 mg total) by mouth daily. Take with a source of Vitamin C 90 tablet 1   folic acid (FOLVITE) 1 MG tablet Take 1 tablet (1 mg total) by mouth daily. 90 tablet 1   hydrALAZINE (APRESOLINE) 50 MG tablet TAKE 1 TABLET BY MOUTH EVERY 8 HOURS. 270 tablet 0    hydrocortisone (ANUSOL-HC) 25 MG suppository Place 1 suppository (25 mg total) rectally at bedtime. 30 suppository 3   Insulin Pen Needle (BD PEN NEEDLE NANO U/F) 32G X 4 MM MISC 10 Units by Does not apply route daily. 100 each 3   Lancets (ONETOUCH DELICA PLUS LANCET33G) MISC CHECK BLOOD SUGAR 3 TIMES A DAY 100 each 2   metFORMIN (GLUCOPHAGE) 500 MG tablet TAKE 2 TABLETS (1,000 MG TOTAL) BY MOUTH 2 (TWO) TIMES DAILY WITH A MEAL. 360 tablet 0   ondansetron (ZOFRAN) 4 MG tablet Take 1 tablet (4 mg total) by mouth every 8 (eight) hours as needed for nausea or vomiting. 4 tablet  0   Riociguat (ADEMPAS) 1 MG TABS Rx # 1: Take 1 tab by mouth three times daily x 2 weeks. If systolic BP > 95, then increase to 1.5 tabs three times daily x 2 weeks 105 tablet 0   Riociguat (ADEMPAS) 2 MG TABS Rx # 2: Take 1 tab by mouth three times daily x 2 weeks 42 tablet 0   tirzepatide (MOUNJARO) 12.5 MG/0.5ML Pen Inject 12.5 mg into the skin once a week. 6 mL 6   warfarin (COUMADIN) 10 MG tablet Take 1/2 tablet on Monday, Wednesday, and Friday. Take 1 tablet all other days. 30 tablet 2   Current Facility-Administered Medications  Medication Dose Route Frequency Provider Last Rate Last Admin   dexamethasone (DECADRON) injection 4 mg  4 mg Intra-articular Once Louann Sjogren, DPM        REVIEW OF SYSTEMS:   Constitutional: ( - ) fevers, ( - )  chills , ( - ) night sweats Eyes: ( - ) blurriness of vision, ( - ) double vision, ( - ) watery eyes Ears, nose, mouth, throat, and face: ( - ) mucositis, ( - ) sore throat Respiratory: ( - ) cough, ( + ) dyspnea, ( - ) wheezes Cardiovascular: ( - ) palpitation, ( - ) chest discomfort, ( - ) lower extremity swelling Gastrointestinal:  ( - ) nausea, ( - ) heartburn, ( - ) change in bowel habits Skin: ( - ) abnormal skin rashes Lymphatics: ( - ) new lymphadenopathy, ( - ) easy bruising Neurological: ( - ) numbness, ( - ) tingling, ( - ) new weaknesses Behavioral/Psych: ( - )  mood change, ( - ) new changes  All other systems were reviewed with the patient and are negative.  PHYSICAL EXAMINATION: ECOG PERFORMANCE STATUS: 1 - Symptomatic but completely ambulatory  Vitals:   07/07/23 0914  BP: (!) 151/92  Pulse: 70  Resp: 14  Temp: 98.6 F (37 C)  SpO2: 98%     Filed Weights   07/07/23 0914  Weight: (!) 346 lb 8 oz (157.2 kg)      GENERAL: well appearing male in NAD, obese SKIN: skin color, texture, turgor are normal, no rashes or significant lesions EYES: conjunctiva are pink and non-injected, sclera clear LUNGS: clear to auscultation and percussion with normal breathing effort HEART: regular rate & rhythm and no murmurs and no lower extremity edema Musculoskeletal: no cyanosis of digits and no clubbing  PSYCH: alert & oriented x 3, fluent speech NEURO: no focal motor/sensory deficits  LABORATORY DATA:  I have reviewed the data as listed    Latest Ref Rng & Units 07/07/2023    8:42 AM 03/26/2023    8:45 AM 12/17/2022    8:22 AM  CBC  WBC 4.0 - 10.5 K/uL 5.2  6.3  7.9   Hemoglobin 13.0 - 17.0 g/dL 40.9  81.1  91.4   Hematocrit 39.0 - 52.0 % 36.8  35.0  33.8   Platelets 150 - 400 K/uL 273  246  263        Latest Ref Rng & Units 07/07/2023    8:42 AM 03/26/2023    8:45 AM 12/17/2022    8:22 AM  CMP  Glucose 70 - 99 mg/dL 85  92  93   BUN 6 - 20 mg/dL 18  20  23    Creatinine 0.61 - 1.24 mg/dL 7.82  9.56  2.13   Sodium 135 - 145 mmol/L 140  139  138  Potassium 3.5 - 5.1 mmol/L 4.1  4.3  4.3   Chloride 98 - 111 mmol/L 112  111  108   CO2 22 - 32 mmol/L 22  22  24    Calcium 8.9 - 10.3 mg/dL 8.9  8.9  9.0   Total Protein 6.5 - 8.1 g/dL 7.1  6.8  7.4   Total Bilirubin <1.2 mg/dL 0.3  0.3  0.3   Alkaline Phos 38 - 126 U/L 70  78  82   AST 15 - 41 U/L 12  11  10    ALT 0 - 44 U/L 13  15  13      ASSESSMENT & PLAN William Moss is a 37 y.o. male who returns for a follow up for history of bilateral pulmonary emboli and left lower extremity  DVT.   #Bilateral PE and left leg DVT: --Likely provoking factors include pneumonia and prolonged immobility due to sedentary lifestyle.  --Currently on coumadin, INR in therapeutic range.  --Hypercoagulable workup from November 2022 was unremarkable. --Patient continues to be sedentary and has evidence of residual pulmonary embolism seen on NM perfusion scan from February 2023. He is following with [facility] for consideration of CTEPH therapies --Recommend to continue on anticoagulation in the setting of continued sedentary lifestyle, persistent dyspnea on exertion and residual PE.  --Labs today show white blood cell count 5.2, hemoglobin 1.4, MCV 78.5, and platelets of 273 --RTC in 3 months with labs (normally q 6 months but returning for anemia)  #Microcytic Anemia, Worsening -- Hemoglobin 11.4 -- Patient is not currently taking his folic acid or iron pills.  Encouraged him to pick them up and start these today.  This is the second visit where he has failed to pick up and take these medications.  #Plantar Fascitis -- Patient wakes up in the morning with pain in his right foot that improves throughout the day. -- Findings sound consistent with plantar fasciitis. -- Recommend conservative methods such as morning stretches with a tennis ball/baseball or water bottles.  #Cord-like nodularity involving R upper extremity-resolved: --PICC line was placed on R upper extremity during recent hospitalization --Doppler ultrasound of the right upper extremity from 08/13/2021 showed no evidence of DVT in the R upper extremity.   No orders of the defined types were placed in this encounter.   All questions were answered. The patient knows to call the clinic with any problems, questions or concerns.  I have spent a total of 30 minutes minutes of face-to-face and non-face-to-face time, preparing to see the patient, performing a medically appropriate examination, counseling and educating the patient,  ordering tests, documenting clinical information in the electronic health record,  and care coordination.   Ulysees Barns, MD Department of Hematology/Oncology [facility] at [facility] Phone: [phone] Pager: [phone] Email: [email or web address]

## 2023-07-08 ENCOUNTER — Ambulatory Visit: Payer: BC Managed Care – PPO | Attending: Family Medicine | Admitting: Physician Assistant

## 2023-07-08 ENCOUNTER — Encounter: Payer: Self-pay | Admitting: Physician Assistant

## 2023-07-08 VITALS — BP 124/83 | HR 71 | Wt 343.4 lb

## 2023-07-08 DIAGNOSIS — Z23 Encounter for immunization: Secondary | ICD-10-CM

## 2023-07-08 DIAGNOSIS — E1122 Type 2 diabetes mellitus with diabetic chronic kidney disease: Secondary | ICD-10-CM | POA: Diagnosis not present

## 2023-07-08 DIAGNOSIS — D649 Anemia, unspecified: Secondary | ICD-10-CM | POA: Diagnosis not present

## 2023-07-08 DIAGNOSIS — G4733 Obstructive sleep apnea (adult) (pediatric): Secondary | ICD-10-CM | POA: Diagnosis not present

## 2023-07-08 DIAGNOSIS — N182 Chronic kidney disease, stage 2 (mild): Secondary | ICD-10-CM

## 2023-07-08 DIAGNOSIS — Z794 Long term (current) use of insulin: Secondary | ICD-10-CM

## 2023-07-08 DIAGNOSIS — I2724 Chronic thromboembolic pulmonary hypertension: Secondary | ICD-10-CM | POA: Diagnosis not present

## 2023-07-08 LAB — POCT INR: POC INR: 3

## 2023-07-08 NOTE — Patient Instructions (Signed)
From [facility]: continue same Coumadin dose. Will see you again in 1 month!

## 2023-07-08 NOTE — Progress Notes (Signed)
Patient ID: William Moss, male   DOB: 1986/08/30, 37 y.o.   MRN: [id number]   William Moss, is a 37 y.o. male  VQQ:[id number]  [id number]  DOB - Nov 14, 1985  No chief complaint on file.      Subjective:   William Moss is a 37 y.o. male here today for INR check and flu shot.  He is doing well.  No new issues or concerns.  No recent illness.  Seen by oncology yesterday and labs done.  Glucose=85 yesterday.  He has lost 20 pounds on mounjaro since July.  He is tolerating it well.  Denies CP/SOB/dizziness.  Does not need RF.  Denies SI/HI.    No problems updated.  ALLERGIES: Allergies  Allergen Reactions   Lisinopril-Hydrochlorothiazide Other (See Comments)    "Interfered with the pH level in the serum"  Acidosis type reaction    PAST MEDICAL HISTORY: Past Medical History:  Diagnosis Date   Atypical chest pain 05/27/2016   a. 05/2016: NST showing small area of moderare anteroapical ischemia b. 05/2016: cath showing tortous but normal cors which confirmed a false positive NST.   Diabetes mellitus without complication (HCC)    GERD (gastroesophageal reflux disease) 10/02/2016   Hypertension    Pulmonary embolism, bilateral (HCC)     MEDICATIONS AT HOME: Prior to Admission medications   Medication Sig Start Date End Date Taking? Authorizing Provider  acetaminophen (TYLENOL) 500 MG tablet Take 1 tablet (500 mg total) by mouth every 6 (six) hours as needed. 03/16/23  Yes Nanavati, Ankit, MD  amLODipine (NORVASC) 10 MG tablet Take 1 tablet (10 mg total) by mouth daily. 12/21/22  Yes Newlin, Odette Horns, MD  atorvastatin (LIPITOR) 40 MG tablet TAKE 1 TABLET BY MOUTH EVERY DAY 05/24/23  Yes Hoy Register, MD  Blood Glucose Monitoring Suppl (ONETOUCH VERIO REFLECT) w/Device KIT Check blood sugar TID E11.69 10/07/20  Yes Newlin, Enobong, MD  carvedilol (COREG) 25 MG tablet Take 1 tablet (25 mg total) by mouth 2 (two) times daily. 12/21/22  Yes Hoy Register, MD  Cyanocobalamin (B-12) 1000  MCG CAPS Take 1 tablet daily 12/23/22  Yes Jaci Standard, MD  ferrous sulfate 325 (65 FE) MG EC tablet Take 1 tablet (325 mg total) by mouth daily. Take with a source of Vitamin C 03/26/23  Yes Jaci Standard, MD  folic acid (FOLVITE) 1 MG tablet Take 1 tablet (1 mg total) by mouth daily. 03/26/23  Yes Jaci Standard, MD  hydrALAZINE (APRESOLINE) 50 MG tablet TAKE 1 TABLET BY MOUTH EVERY 8 HOURS. 05/24/23  Yes Newlin, Enobong, MD  hydrocortisone (ANUSOL-HC) 25 MG suppository Place 1 suppository (25 mg total) rectally at bedtime. 11/10/22  Yes Hilarie Fredrickson, MD  Insulin Pen Needle (BD PEN NEEDLE NANO U/F) 32G X 4 MM MISC 10 Units by Does not apply route daily. 10/07/20  Yes Hoy Register, MD  Lancets (ONETOUCH DELICA PLUS LANCET33G) MISC CHECK BLOOD SUGAR 3 TIMES A DAY 01/13/21  Yes Newlin, Enobong, MD  metFORMIN (GLUCOPHAGE) 500 MG tablet TAKE 2 TABLETS (1,000 MG TOTAL) BY MOUTH 2 (TWO) TIMES DAILY WITH A MEAL. 07/05/23  Yes Hoy Register, MD  ondansetron (ZOFRAN) 4 MG tablet Take 1 tablet (4 mg total) by mouth every 8 (eight) hours as needed for nausea or vomiting. 09/21/21  Yes Horton, Clabe Seal, DO  Riociguat (ADEMPAS) 1 MG TABS Rx # 1: Take 1 tab by mouth three times daily x 2 weeks. If systolic BP > 95, then  increase to 1.5 tabs three times daily x 2 weeks 01/18/23  Yes Hunsucker, Lesia Sago, MD  Riociguat (ADEMPAS) 2 MG TABS Rx # 2: Take 1 tab by mouth three times daily x 2 weeks 01/18/23  Yes Hunsucker, Lesia Sago, MD  tirzepatide [facility]) 12.5 MG/0.5ML Pen Inject 12.5 mg into the skin once a week. 01/11/23  Yes Hoy Register, MD  warfarin (COUMADIN) 10 MG tablet Take 1/2 tablet on Monday, Wednesday, and Friday. Take 1 tablet all other days. 06/30/23  Yes Hoy Register, MD  lisinopril-hydrochlorothiazide (ZESTORETIC) 20-12.5 MG tablet Take 2 tablets by mouth daily. 07/22/20 08/27/20  Hoy Register, MD    ROS: Neg HEENT Neg resp Neg cardiac Neg GI Neg GU Neg MS Neg psych Neg  neuro  Objective:   Vitals:   07/08/23 0852  BP: 124/83  Pulse: 71  SpO2: 97%  Weight: (!) 343 lb 6.4 oz (155.8 kg)   Exam General appearance : Awake, alert, not in any distress. Speech Clear. Not toxic looking HEENT: Atraumatic and NormocephalicNeck: Supple, no JVD. No cervical lymphadenopathy.  Chest: Good air entry bilaterally, CTAB.  No rales/rhonchi/wheezing CVS: S1 S2 regular, no murmurs.  Extremities: B/L Lower Ext shows no edema, both legs are warm to touch Neurology: Awake alert, and oriented X 3, CN II-XII intact, Non focal Skin: No Rash  Data Review Lab Results  Component Value Date   HGBA1C 6.0 12/21/2022   HGBA1C 6.7 (H) 09/18/2022   HGBA1C 8.1 (H) 05/19/2022    Assessment & Plan   1. Type 2 diabetes mellitus with stage 2 chronic kidney disease, with long-term current use of insulin (HCC) On mounjaro and losing weight.    2. Anemia, unspecified type Stable and on iron  3. Chronic thromboembolic pulmonary hypertension (HCC) Followed by Franky Macho.  3.0 today.  See Franky Macho in 1 month.  Continue current regimen - POCT INR  4. Needs flu shot Flu shot given    Return in about 3 months (around 10/08/2023) for PCP for chronic conditions-Newlin.  The patient was given clear instructions to go to ER or return to medical center if symptoms don't improve, worsen or new problems develop. The patient verbalized understanding. The patient was told to call to get lab results if they haven't heard anything in the next week.      Georgian Co, PA-C [facility] [place], [place] [phone]   07/08/2023, 9:03 AM

## 2023-07-10 LAB — METHYLMALONIC ACID, SERUM: Methylmalonic Acid, Quantitative: 197 nmol/L (ref 0–378)

## 2023-07-12 ENCOUNTER — Other Ambulatory Visit: Payer: Self-pay

## 2023-08-04 ENCOUNTER — Other Ambulatory Visit: Payer: Self-pay

## 2023-08-07 DIAGNOSIS — G4733 Obstructive sleep apnea (adult) (pediatric): Secondary | ICD-10-CM | POA: Diagnosis not present

## 2023-08-08 ENCOUNTER — Other Ambulatory Visit: Payer: Self-pay

## 2023-08-08 ENCOUNTER — Emergency Department (HOSPITAL_COMMUNITY)
Admission: EM | Admit: 2023-08-08 | Discharge: 2023-08-08 | Disposition: A | Discharge status: Home / Self Care | Payer: BC Managed Care – PPO | Attending: Emergency Medicine | Admitting: Emergency Medicine

## 2023-08-08 DIAGNOSIS — E119 Type 2 diabetes mellitus without complications: Secondary | ICD-10-CM | POA: Diagnosis not present

## 2023-08-08 DIAGNOSIS — Z7984 Long term (current) use of oral hypoglycemic drugs: Secondary | ICD-10-CM | POA: Insufficient documentation

## 2023-08-08 DIAGNOSIS — J069 Acute upper respiratory infection, unspecified: Secondary | ICD-10-CM | POA: Insufficient documentation

## 2023-08-08 DIAGNOSIS — Z79899 Other long term (current) drug therapy: Secondary | ICD-10-CM | POA: Diagnosis not present

## 2023-08-08 DIAGNOSIS — H6502 Acute serous otitis media, left ear: Secondary | ICD-10-CM | POA: Insufficient documentation

## 2023-08-08 DIAGNOSIS — J3489 Other specified disorders of nose and nasal sinuses: Secondary | ICD-10-CM | POA: Diagnosis not present

## 2023-08-08 DIAGNOSIS — I10 Essential (primary) hypertension: Secondary | ICD-10-CM | POA: Diagnosis not present

## 2023-08-08 DIAGNOSIS — Z7901 Long term (current) use of anticoagulants: Secondary | ICD-10-CM | POA: Insufficient documentation

## 2023-08-08 DIAGNOSIS — J029 Acute pharyngitis, unspecified: Secondary | ICD-10-CM | POA: Diagnosis not present

## 2023-08-08 DIAGNOSIS — B9789 Other viral agents as the cause of diseases classified elsewhere: Secondary | ICD-10-CM | POA: Diagnosis not present

## 2023-08-08 DIAGNOSIS — Z794 Long term (current) use of insulin: Secondary | ICD-10-CM | POA: Insufficient documentation

## 2023-08-08 MED ORDER — AMOXICILLIN-POT CLAVULANATE 875-125 MG PO TABS: 1.0000 | ORAL_TABLET | Freq: Two times a day (BID) | ORAL | 0 refills | Status: AC

## 2023-08-08 NOTE — Discharge Instructions (Signed)
You were seen in the emergency room today and found to have ear infection.  I think this is likely due to your upper respiratory symptoms.  You can alternate Claritin and Zyrtec for nasal congestion.  Make sure you are staying well-hydrated with water for alternating Pedialyte and Gatorade.  You can also use over-the-counter cold and flu medicines.  You can try cough drops or hot tea with honey and lemon.  If your symptoms are not starting to improve in 2 to 3 days please return to emergency room.  If you have any new or worsening symptoms, develop any chest pain, productive cough, shortness of breath please return to emergency room.

## 2023-08-08 NOTE — ED Triage Notes (Addendum)
Pt arrived via POV. C/o fullness and ringing in L ear for 4x days. Also endorses sinus congestion  AOx4

## 2023-08-08 NOTE — ED Provider Notes (Signed)
[place] EMERGENCY DEPARTMENT AT [facility] Provider Note   CSN: [id number] Arrival date & time: 08/08/23  1511     History {Add pertinent medical, surgical, social history, OB history to HPI:1} Chief Complaint  Patient presents with   Ear Fullness   Tinnitus    William Moss is a 37 y.o. male with past medical history of obesity, type 2 diabetes, hypertension, coronary artery disease, DVT on warfarin presenting to emergency room with left ear fullness and tinnitus for 4 days.  Patient is also having sore throat, rhinorrhea.  Denies any shortness of breath, chest pain, cough, abdominal pain nausea vomiting or diarrhea. No fevers or chills. Patient reports has been able to take all of his medications despite being ill.    Ear Fullness       Home Medications Prior to Admission medications   Medication Sig Start Date End Date Taking? Authorizing Provider  acetaminophen (TYLENOL) 500 MG tablet Take 1 tablet (500 mg total) by mouth every 6 (six) hours as needed. 03/16/23   Derwood Kaplan, MD  amLODipine (NORVASC) 10 MG tablet Take 1 tablet (10 mg total) by mouth daily. 12/21/22   Hoy Register, MD  atorvastatin (LIPITOR) 40 MG tablet TAKE 1 TABLET BY MOUTH EVERY DAY 05/24/23   Hoy Register, MD  Blood Glucose Monitoring Suppl (ONETOUCH VERIO REFLECT) w/Device KIT Check blood sugar TID E11.69 10/07/20   Hoy Register, MD  carvedilol (COREG) 25 MG tablet Take 1 tablet (25 mg total) by mouth 2 (two) times daily. 12/21/22   Hoy Register, MD  Cyanocobalamin (B-12) 1000 MCG CAPS Take 1 tablet daily 12/23/22   Jaci Standard, MD  ferrous sulfate 325 (65 FE) MG EC tablet Take 1 tablet (325 mg total) by mouth daily. Take with a source of Vitamin C 03/26/23   Jaci Standard, MD  folic acid (FOLVITE) 1 MG tablet Take 1 tablet (1 mg total) by mouth daily. 03/26/23   Jaci Standard, MD  hydrALAZINE (APRESOLINE) 50 MG tablet TAKE 1 TABLET BY MOUTH EVERY 8 HOURS. 05/24/23    Hoy Register, MD  hydrocortisone (ANUSOL-HC) 25 MG suppository Place 1 suppository (25 mg total) rectally at bedtime. 11/10/22   Hilarie Fredrickson, MD  Insulin Pen Needle (BD PEN NEEDLE NANO U/F) 32G X 4 MM MISC 10 Units by Does not apply route daily. 10/07/20   Hoy Register, MD  Lancets (ONETOUCH DELICA PLUS LANCET33G) MISC CHECK BLOOD SUGAR 3 TIMES A DAY 01/13/21   Hoy Register, MD  metFORMIN (GLUCOPHAGE) 500 MG tablet TAKE 2 TABLETS (1,000 MG TOTAL) BY MOUTH 2 (TWO) TIMES DAILY WITH A MEAL. 07/05/23   Hoy Register, MD  ondansetron (ZOFRAN) 4 MG tablet Take 1 tablet (4 mg total) by mouth every 8 (eight) hours as needed for nausea or vomiting. 09/21/21   Horton, Clabe Seal, DO  Riociguat (ADEMPAS) 1 MG TABS Rx # 1: Take 1 tab by mouth three times daily x 2 weeks. If systolic BP > 95, then increase to 1.5 tabs three times daily x 2 weeks 01/18/23   Hunsucker, Lesia Sago, MD  Riociguat (ADEMPAS) 2 MG TABS Rx # 2: Take 1 tab by mouth three times daily x 2 weeks 01/18/23   Hunsucker, Lesia Sago, MD  tirzepatide [facility]) 12.5 MG/0.5ML Pen Inject 12.5 mg into the skin once a week. 01/11/23   Hoy Register, MD  warfarin (COUMADIN) 10 MG tablet Take 1/2 tablet on Monday, Wednesday, and Friday. Take 1  tablet all other days. 06/30/23   Hoy Register, MD  lisinopril-hydrochlorothiazide (ZESTORETIC) 20-12.5 MG tablet Take 2 tablets by mouth daily. 07/22/20 08/27/20  Hoy Register, MD      Allergies    Lisinopril-hydrochlorothiazide    Review of Systems   Review of Systems  Physical Exam Updated Vital Signs BP (!) 144/83 (BP Location: Right Arm)   Pulse 94   Temp 98.2 F (36.8 C) (Oral)   Resp 18   Ht 5\' 8"  (1.727 m)   Wt (!) 155.1 kg   SpO2 93%   BMI 52.00 kg/m  Physical Exam  ED Results / Procedures / Treatments   Labs (all labs ordered are listed, but only abnormal results are displayed) Labs Reviewed - No data to display  EKG None  Radiology No results  found.  Procedures Procedures  {Document cardiac monitor, telemetry assessment procedure when appropriate:1}  Medications Ordered in ED Medications - No data to display  ED Course/ Medical Decision Making/ A&P   {   Click here for ABCD2, HEART and other calculatorsREFRESH Note before signing :1}                              Medical Decision Making  ***  {Document critical care time when appropriate:1} {Document review of labs and clinical decision tools ie heart score, Chads2Vasc2 etc:1}  {Document your independent review of radiology images, and any outside records:1} {Document your discussion with family members, caretakers, and with consultants:1} {Document social determinants of health affecting pt's care:1} {Document your decision making why or why not admission, treatments were needed:1} Final Clinical Impression(s) / ED Diagnoses Final diagnoses:  None    Rx / DC Orders ED Discharge Orders     None

## 2023-08-10 ENCOUNTER — Ambulatory Visit: Payer: BC Managed Care – PPO | Attending: Family Medicine | Admitting: Pharmacist

## 2023-08-10 DIAGNOSIS — I2724 Chronic thromboembolic pulmonary hypertension: Secondary | ICD-10-CM

## 2023-08-10 LAB — POCT INR: POC INR: 4.1

## 2023-08-30 ENCOUNTER — Other Ambulatory Visit: Payer: Self-pay

## 2023-08-30 ENCOUNTER — Ambulatory Visit: Payer: BC Managed Care – PPO | Attending: Family Medicine | Admitting: Pharmacist

## 2023-08-30 DIAGNOSIS — I2782 Chronic pulmonary embolism: Secondary | ICD-10-CM | POA: Diagnosis not present

## 2023-08-30 DIAGNOSIS — I82512 Chronic embolism and thrombosis of left femoral vein: Secondary | ICD-10-CM

## 2023-08-30 LAB — POCT INR: POC INR: 2.6

## 2023-09-07 ENCOUNTER — Other Ambulatory Visit: Payer: Self-pay

## 2023-09-07 DIAGNOSIS — G4733 Obstructive sleep apnea (adult) (pediatric): Secondary | ICD-10-CM | POA: Diagnosis not present

## 2023-09-14 IMAGING — CT CT ANGIO CHEST
2 of 6 series · 18 of 36 positions shown · IV contrast (omnipaque)
Comparison: Chest CT dated 06/07/2021 and radiograph dated
06/23/2021.

CLINICAL DATA: Concern for pulmonary embolism.

EXAM:
CT ANGIOGRAPHY CHEST WITH CONTRAST
TECHNIQUE: Multidetector CT imaging of the chest was performed using the
standard protocol during bolus administration of intravenous
contrast. Multiplanar CT image reconstructions and MIPs were
obtained to evaluate the vascular anatomy.
CONTRAST:  75mL OMNIPAQUE IOHEXOL 350 MG/ML SOLN

[Series 5: thins · axial · 0.85mm/px · z∈[-253,-13]mm · 17 of 270 slices shown]
[im 15/270  lung]
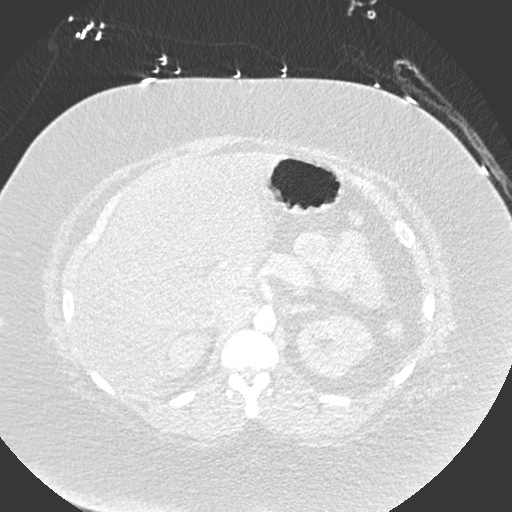
[im 30/270  mediastinal]
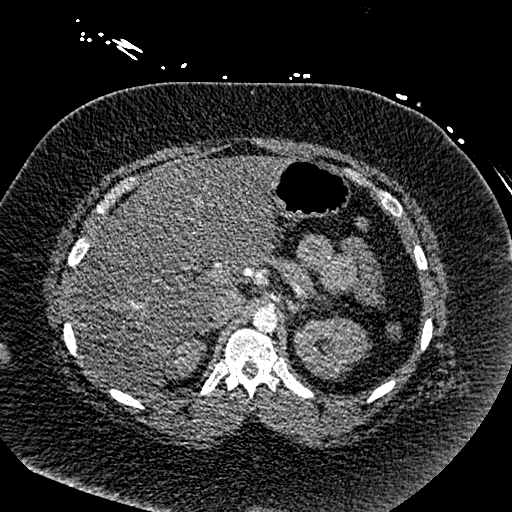
[im 45/270  lung]
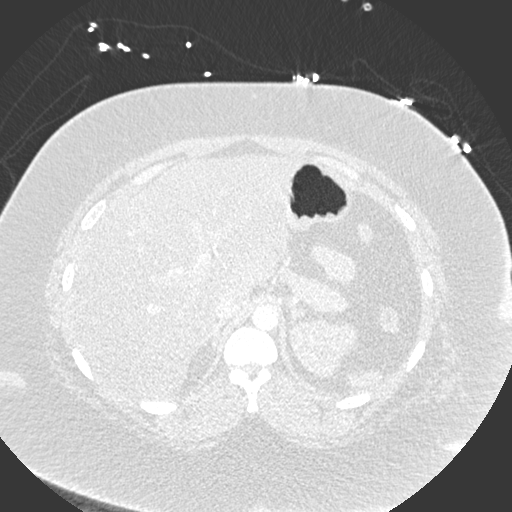
[im 60/270  mediastinal]
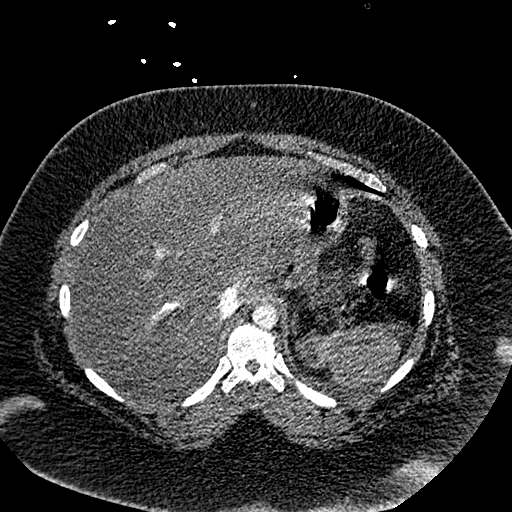
[im 75/270  lung]
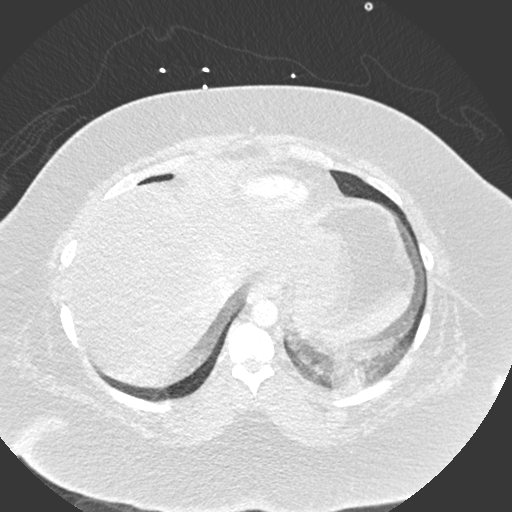
[im 90/270  mediastinal]
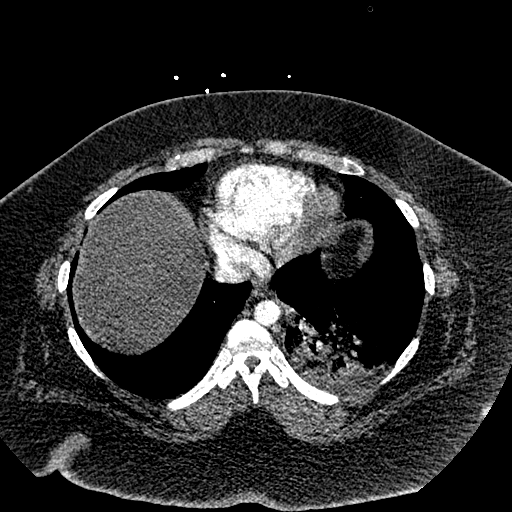
[im 105/270  lung]
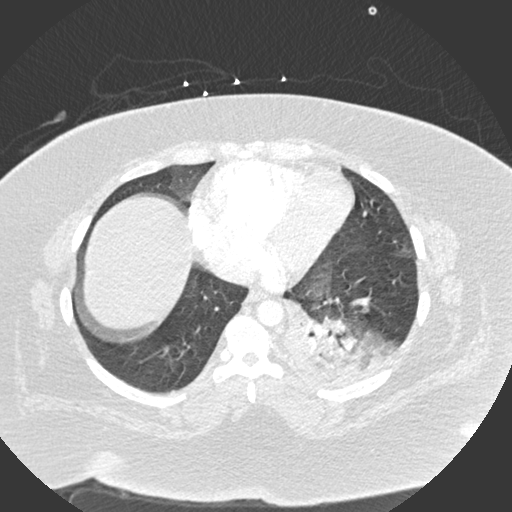
[im 120/270  mediastinal]
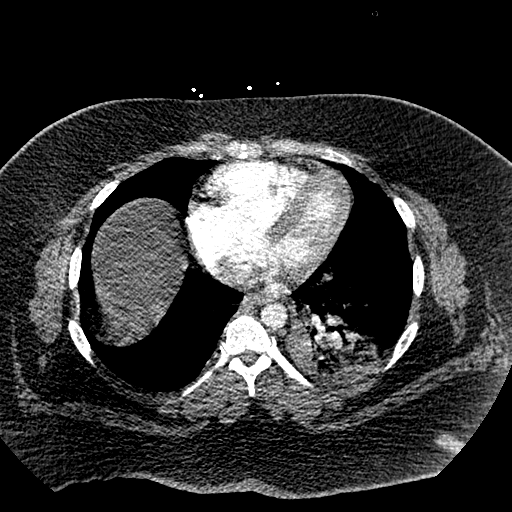
[im 135/270  lung]
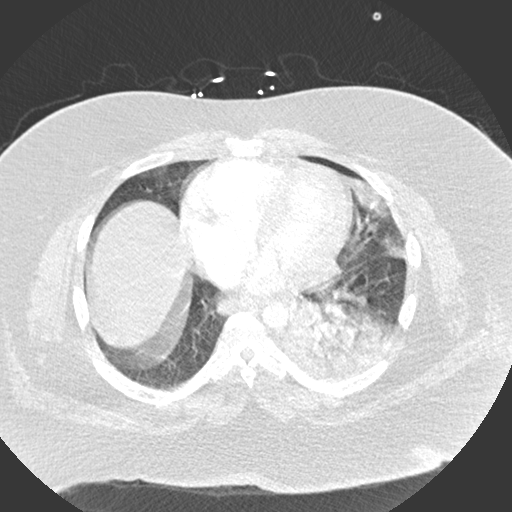
[im 150/270  mediastinal]
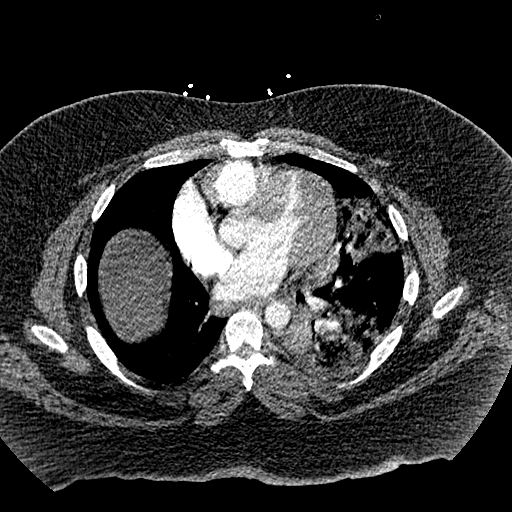
[im 165/270  lung]
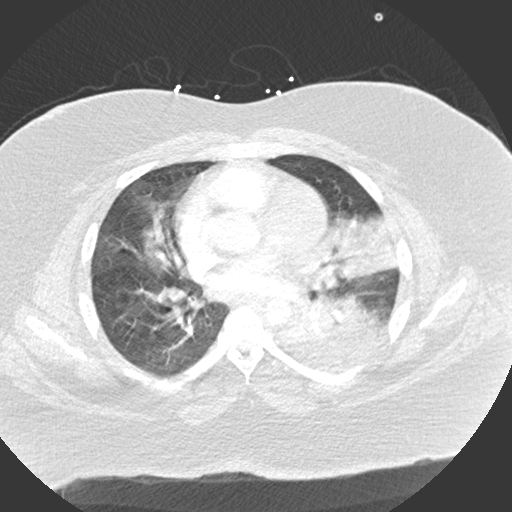
[im 180/270  mediastinal]
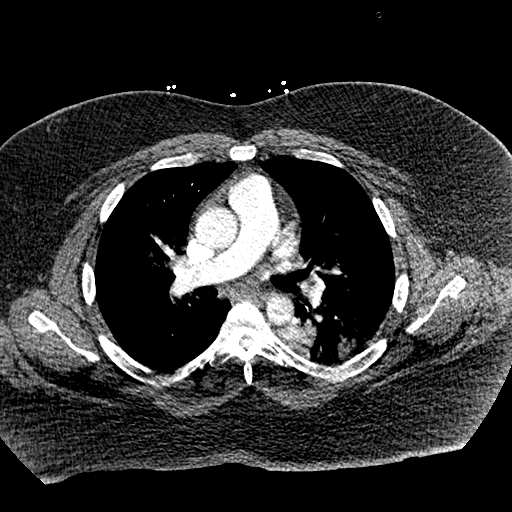
[im 195/270  lung]
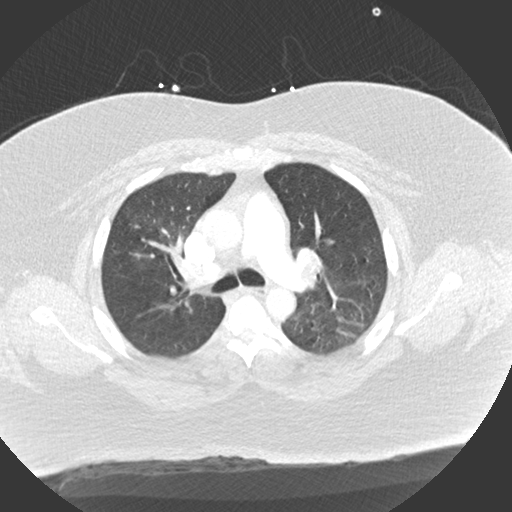
[im 210/270  mediastinal]
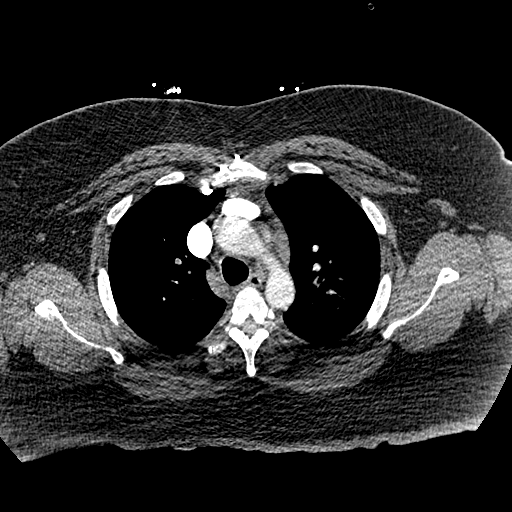
[im 225/270  lung]
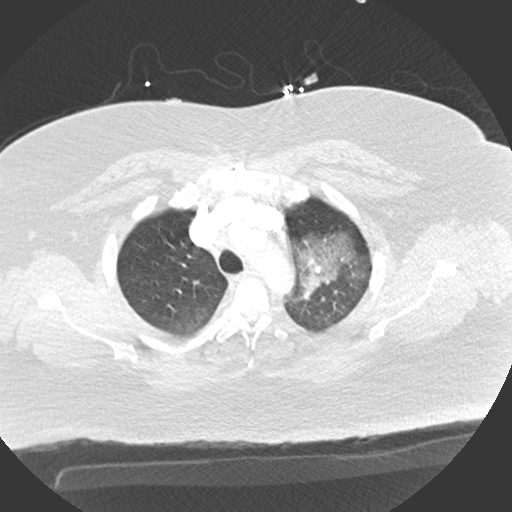
[im 240/270  mediastinal]
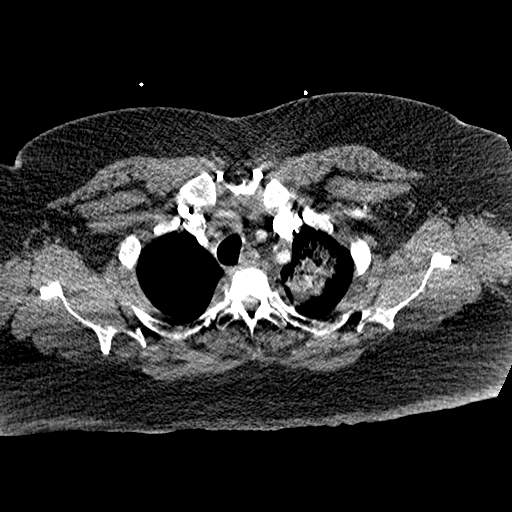
[im 255/270  lung]
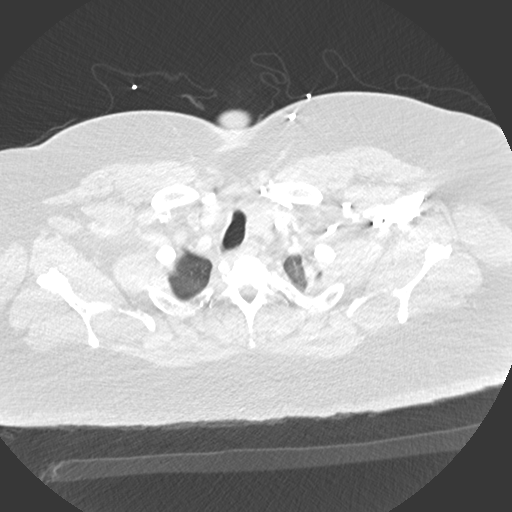

[Series 7: coronal mpr · coronal · 0.58mm/px · 1 of 180 slices shown]
[im 90/180  mediastinal]
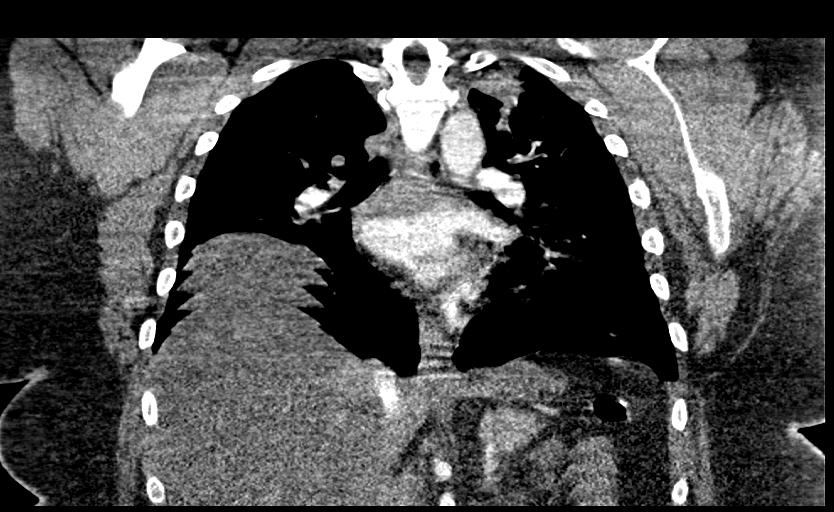

[18 of 36 positions shown; findings below may reference images not displayed]

FINDINGS: Cardiovascular: No cardiomegaly or pericardial effusion. Mild
dilatation of the right heart chambers. The right ventricular lumen
measures 5.3 cm and the left ventricular lumen measures 3.2 cm for a
ratio of 1.7 consistent with right heart straining. The thoracic
aorta is unremarkable. Evaluation of the pulmonary arteries is very
limited due to severe respiratory motion artifact. There are however
bilateral lobar pulmonary artery emboli extending from the central
branches into the upper and lower lobe lobar branches.

Mediastinum/Nodes: No hilar or mediastinal adenopathy. The esophagus
is grossly unremarkable. No mediastinal fluid collection.

Lungs/Pleura: Patchy areas of consolidative change involving the
left lower lobe, lingula, left upper lobe and right perihilar region
may represent multilobar. Pulmonary infarcts are less likely but not
excluded. No pleural effusion or pneumothorax. The central airways
are patent.

Upper Abdomen: Fatty liver.

Musculoskeletal: No chest wall abnormality. No acute or significant
osseous findings.

Review of the MIP images confirms the above findings.
IMPRESSION: 1. Bilateral lobar pulmonary artery emboli with CT evidence of right
heart straining (RV/LV Ratio = 1.7) consistent with at least
submassive (intermediate risk) PE. The presence of right heart
strain has been associated with an increased risk of morbidity and
mortality.
2. Bilateral pulmonary opacities may represent multilobar pneumonia.
Pulmonary infarcts are less likely but not excluded.
3. Fatty liver.

These results were called by telephone at the time of interpretation
on 06/23/2021 at [DATE] to provider ZMARY CARP , who verbally
acknowledged these results.

## 2023-09-15 ENCOUNTER — Other Ambulatory Visit: Payer: Self-pay | Admitting: Family Medicine

## 2023-09-15 DIAGNOSIS — I152 Hypertension secondary to endocrine disorders: Secondary | ICD-10-CM

## 2023-09-15 IMAGING — DX DG CHEST 1V PORT
1 series · 2 of 2 positions shown · non-contrast
Comparison: June 23, 2021

CLINICAL DATA: Tachypnea.

EXAM:
PORTABLE CHEST 1 VIEW

[Series 1: chest ap · 0.14mm/px · 2 of 2 slices shown]
[im 1/2]
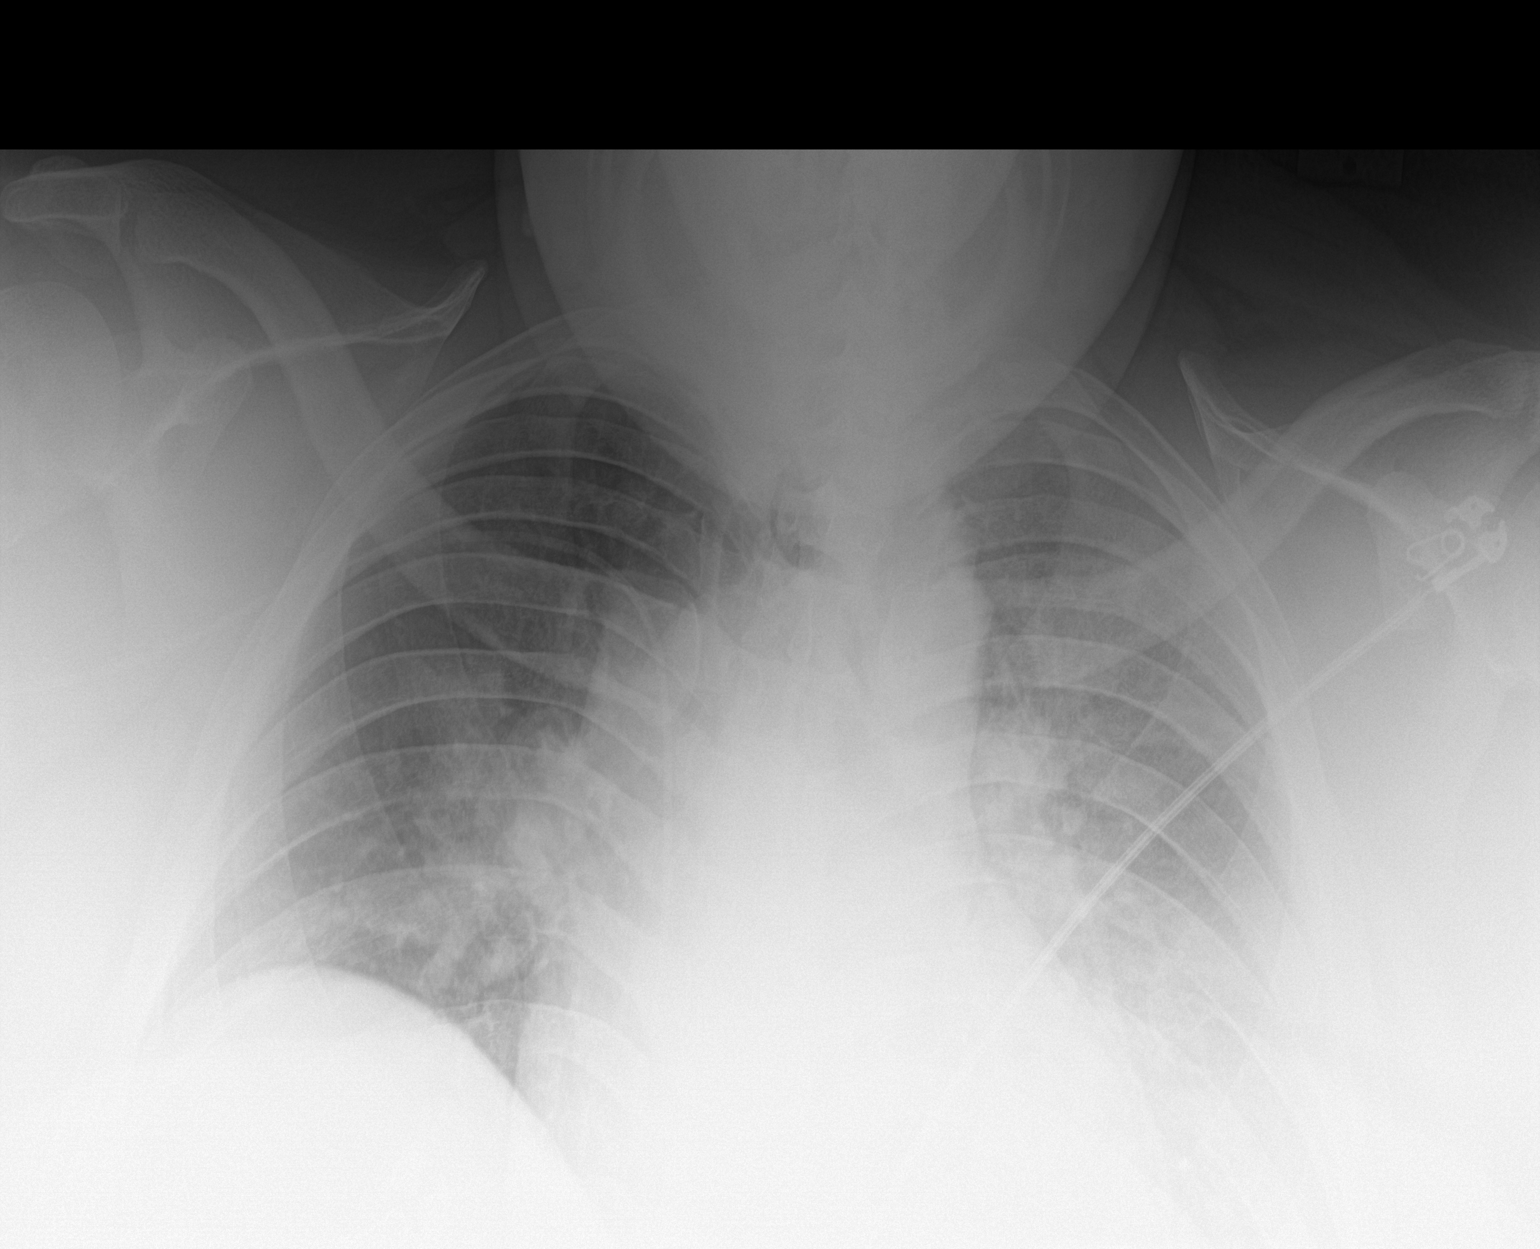
[im 2/2]
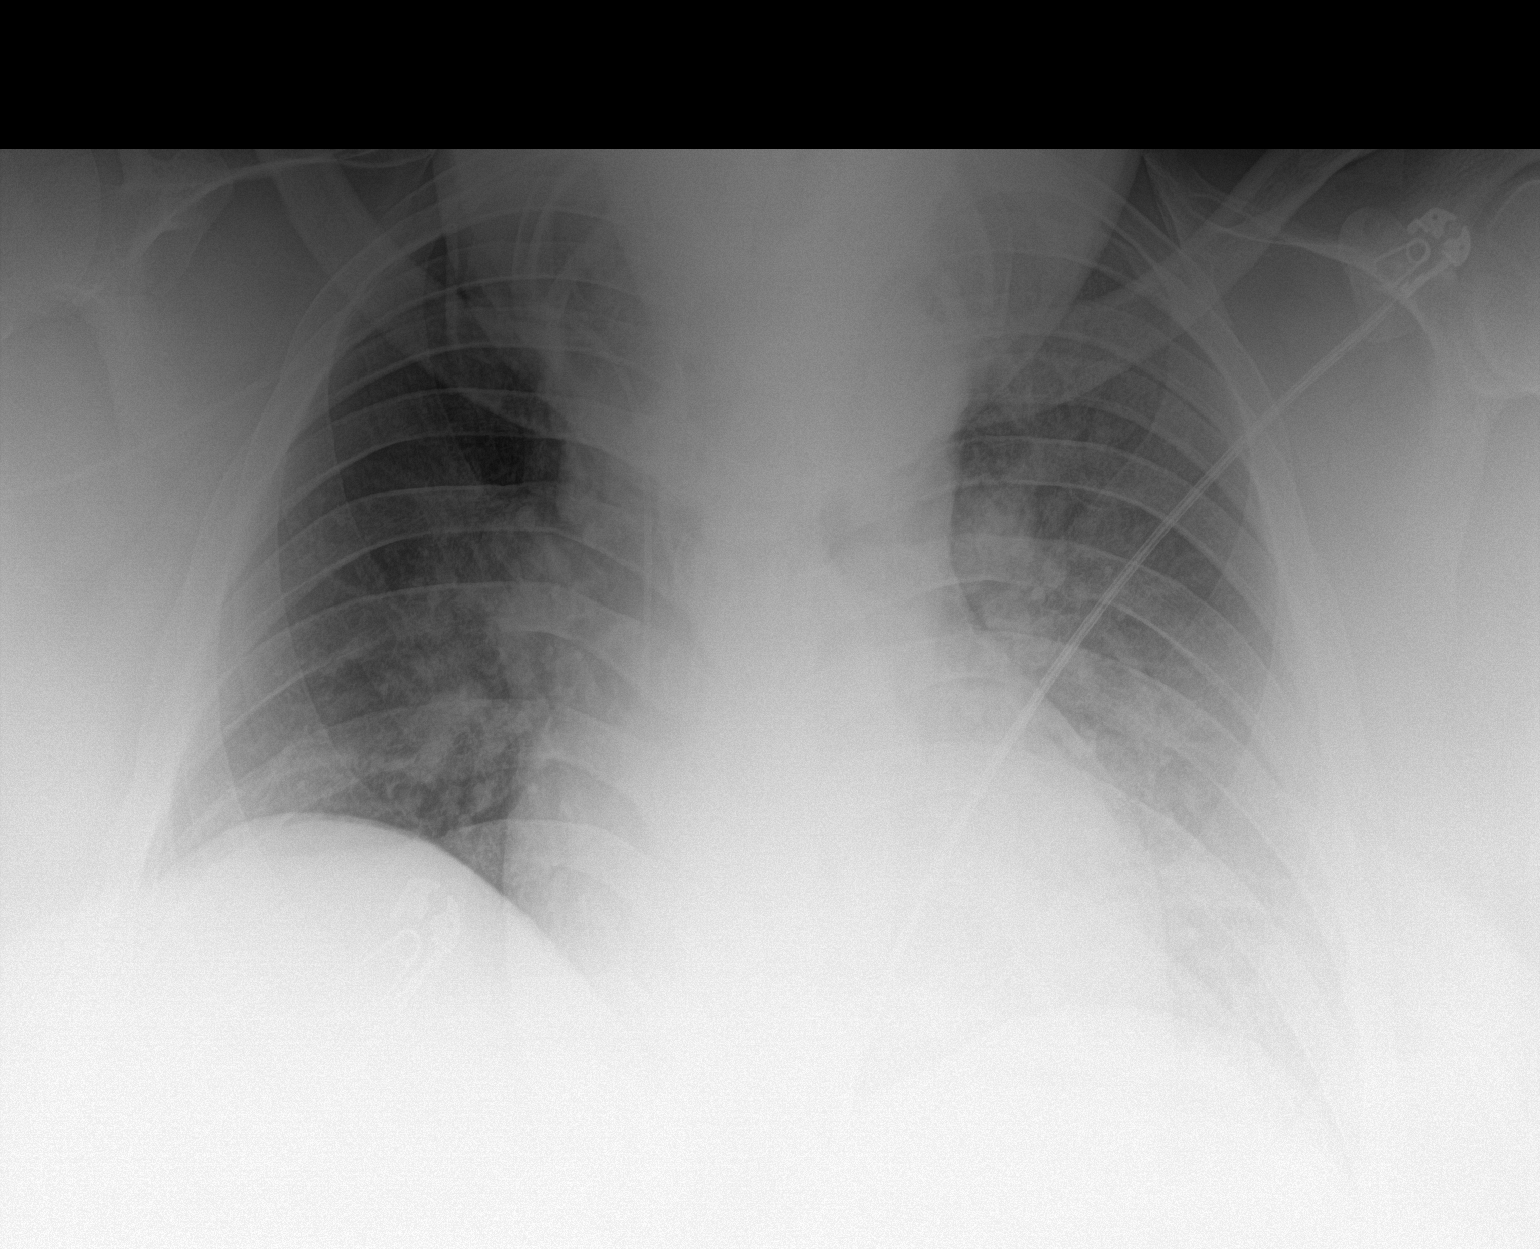

[2 of 2 positions shown; findings below may reference images not displayed]

FINDINGS: Study is limited secondary to positioning of the patient's head and
neck. A right-sided PICC line is seen with its distal tip noted at
the junction of the superior vena cava and right atrium. This
represents a new finding when compared to the prior study. There is
mild, stable prominence of the perihilar pulmonary vasculature.
There is no evidence of acute infiltrate, pleural effusion or
pneumothorax. The heart size and mediastinal contours are within
normal limits. The visualized skeletal structures are unremarkable.
IMPRESSION: 1. Interval right-sided PICC line placement and positioning, as
described above.
2. Mild pulmonary vascular congestion.

## 2023-09-24 ENCOUNTER — Other Ambulatory Visit: Payer: Self-pay

## 2023-09-30 ENCOUNTER — Ambulatory Visit: Payer: BC Managed Care – PPO | Attending: Family Medicine | Admitting: Pharmacist

## 2023-09-30 DIAGNOSIS — I82512 Chronic embolism and thrombosis of left femoral vein: Secondary | ICD-10-CM | POA: Diagnosis not present

## 2023-09-30 DIAGNOSIS — I2782 Chronic pulmonary embolism: Secondary | ICD-10-CM

## 2023-09-30 DIAGNOSIS — I2724 Chronic thromboembolic pulmonary hypertension: Secondary | ICD-10-CM | POA: Diagnosis not present

## 2023-09-30 LAB — POCT INR: POC INR: 3.9

## 2023-10-04 ENCOUNTER — Other Ambulatory Visit: Payer: Self-pay | Admitting: Family Medicine

## 2023-10-04 DIAGNOSIS — E1122 Type 2 diabetes mellitus with diabetic chronic kidney disease: Secondary | ICD-10-CM

## 2023-10-06 ENCOUNTER — Other Ambulatory Visit: Payer: Self-pay | Admitting: Hematology and Oncology

## 2023-10-06 DIAGNOSIS — D500 Iron deficiency anemia secondary to blood loss (chronic): Secondary | ICD-10-CM

## 2023-10-07 ENCOUNTER — Telehealth: Payer: Self-pay | Admitting: *Deleted

## 2023-10-07 ENCOUNTER — Inpatient Hospital Stay: Payer: BC Managed Care – PPO | Admitting: Hematology and Oncology

## 2023-10-07 ENCOUNTER — Inpatient Hospital Stay: Payer: BC Managed Care – PPO | Attending: Hematology and Oncology

## 2023-10-07 DIAGNOSIS — D500 Iron deficiency anemia secondary to blood loss (chronic): Secondary | ICD-10-CM

## 2023-10-07 DIAGNOSIS — Z7901 Long term (current) use of anticoagulants: Secondary | ICD-10-CM | POA: Insufficient documentation

## 2023-10-07 DIAGNOSIS — Z86718 Personal history of other venous thrombosis and embolism: Secondary | ICD-10-CM | POA: Diagnosis not present

## 2023-10-07 DIAGNOSIS — M722 Plantar fascial fibromatosis: Secondary | ICD-10-CM | POA: Insufficient documentation

## 2023-10-07 DIAGNOSIS — I2699 Other pulmonary embolism without acute cor pulmonale: Secondary | ICD-10-CM

## 2023-10-07 DIAGNOSIS — D509 Iron deficiency anemia, unspecified: Secondary | ICD-10-CM | POA: Diagnosis not present

## 2023-10-07 DIAGNOSIS — Z86711 Personal history of pulmonary embolism: Secondary | ICD-10-CM | POA: Insufficient documentation

## 2023-10-07 LAB — CMP (CANCER CENTER ONLY)
ALT: 12 U/L (ref 0–44)
AST: 10 U/L — ABNORMAL LOW (ref 15–41)
Albumin: 3.9 g/dL (ref 3.5–5.0)
Alkaline Phosphatase: 77 U/L (ref 38–126)
Anion gap: 7 (ref 5–15)
BUN: 19 mg/dL (ref 6–20)
CO2: 22 mmol/L (ref 22–32)
Calcium: 8.7 mg/dL — ABNORMAL LOW (ref 8.9–10.3)
Chloride: 108 mmol/L (ref 98–111)
Creatinine: 1.29 mg/dL — ABNORMAL HIGH (ref 0.61–1.24)
GFR, Estimated: >60 mL/min (ref >60)
Glucose, Bld: 92 mg/dL (ref 70–99)
Potassium: 4.2 mmol/L (ref 3.5–5.1)
Sodium: 137 mmol/L (ref 135–145)
Total Bilirubin: 0.4 mg/dL (ref 0.0–1.2)
Total Protein: 7.2 g/dL (ref 6.5–8.1)

## 2023-10-07 LAB — CBC WITH DIFFERENTIAL (CANCER CENTER ONLY)
Abs Immature Granulocytes: 0.02 10*3/uL (ref 0.00–0.07)
Basophils Absolute: 0 10*3/uL (ref 0.0–0.1)
Basophils Relative: 0 %
Eosinophils Absolute: 0.2 10*3/uL (ref 0.0–0.5)
Eosinophils Relative: 3 %
HCT: 37.4 % — ABNORMAL LOW (ref 39.0–52.0)
Hemoglobin: 11.8 g/dL — ABNORMAL LOW (ref 13.0–17.0)
Immature Granulocytes: 0 %
Lymphocytes Relative: 29 %
Lymphs Abs: 2 10*3/uL (ref 0.7–4.0)
MCH: 25.1 pg — ABNORMAL LOW (ref 26.0–34.0)
MCHC: 31.6 g/dL (ref 30.0–36.0)
MCV: 79.6 fL — ABNORMAL LOW (ref 80.0–100.0)
Monocytes Absolute: 0.7 10*3/uL (ref 0.1–1.0)
Monocytes Relative: 10 %
Neutro Abs: 3.9 10*3/uL (ref 1.7–7.7)
Neutrophils Relative %: 58 %
Platelet Count: 237 10*3/uL (ref 150–400)
RBC: 4.7 MIL/uL (ref 4.22–5.81)
RDW: 16.6 % — ABNORMAL HIGH (ref 11.5–15.5)
WBC Count: 6.8 10*3/uL (ref 4.0–10.5)
nRBC: 0 % (ref 0.0–0.2)

## 2023-10-07 LAB — VITAMIN B12: Vitamin B-12: 104 pg/mL — ABNORMAL LOW (ref 180–914)

## 2023-10-07 LAB — RETIC PANEL
Immature Retic Fract: 29.5 % — ABNORMAL HIGH (ref 2.3–15.9)
RBC.: 4.62 MIL/uL (ref 4.22–5.81)
Retic Count, Absolute: 73 10*3/uL (ref 19.0–186.0)
Retic Ct Pct: 1.6 % (ref 0.4–3.1)
Reticulocyte Hemoglobin: 29.1 pg (ref >27.9)

## 2023-10-07 LAB — IRON AND IRON BINDING CAPACITY (CC-WL,HP ONLY)
Iron: 38 ug/dL — ABNORMAL LOW (ref 45–182)
Saturation Ratios: 10 % — ABNORMAL LOW (ref 17.9–39.5)
TIBC: 388 ug/dL (ref 250–450)
UIBC: 350 ug/dL (ref 117–376)

## 2023-10-07 LAB — LACTATE DEHYDROGENASE: LDH: 152 U/L (ref 98–192)

## 2023-10-07 LAB — FERRITIN: Ferritin: 5 ng/mL — ABNORMAL LOW (ref 24–336)

## 2023-10-07 LAB — FOLATE: Folate: 6.5 ng/mL (ref >5.9)

## 2023-10-07 NOTE — Progress Notes (Signed)
 [facility] [phone]   [phone]  PROGRESS NOTE  Patient Care Team: Delbert Clam, MD as PCP - General (Family Medicine) Raford Riggs, MD as PCP - Cardiology (Cardiology)  CHIEF COMPLAINTS/PURPOSE OF CONSULTATION:  History of bilateral pulmonary emboli and left lower extremity DVT  HISTORY OF PRESENTING ILLNESS:  William Moss 38 y.o. male returns for a follow up for history of bilateral pulmonary emboli and left lower extremity DVT. He continues on anticoagulation therapy with coumadin .   Ms. Urwin reports he went to the emergency department in December 2024 due to an ear infection.  He took antibiotic therapy and reports that his symptoms have resolved.  He has no residual symptoms.  He reports his energy is currently 7 out of 10.  He notes that he is taking his Coumadin  as prescribed and his INR is typically 2.5-3.0.  He reports he is not having any trouble with bleeding, bruising, or dark stools.  He notes is not having any other side effects as result of his Coumadin  therapy.  He has no signs or symptoms concerning for recurrent VTE.  He reports that he still has not yet filled the folic acid  and iron pills we have called in.  We offered him assistance and provided him with names to purchase of over-the-counter but he has not yet quired these.  He notes his appetite is good and he has eating red meat approximate 2-3 times per week.  He continues to take his Coumadin  reports his INR has been within the therapeutic range.  He denies fevers, chills, night sweats, chest pain, cough, lower extremity edema, neuropathy or other skin changes.  He has no other complaints.  Rest of the 10 point ROS is below.  MEDICAL HISTORY:  Past Medical History:  Diagnosis Date   Atypical chest pain 05/27/2016   a. 05/2016: NST showing small area of moderare anteroapical ischemia b. 05/2016: cath showing tortous but normal cors which confirmed a false positive NST.    Diabetes mellitus without complication (HCC)    GERD (gastroesophageal reflux disease) 10/02/2016   Hypertension    Pulmonary embolism, bilateral (HCC)     SURGICAL HISTORY: Past Surgical History:  Procedure Laterality Date   CARDIAC CATHETERIZATION N/A 06/10/2016   Procedure: Left Heart Cath and Coronary Angiography;  Surgeon: Deatrice DELENA Cage, MD;  Location: [facility];  Service: Cardiovascular;  Laterality: N/A;   COLONOSCOPY N/A 02/04/2016   Procedure: COLONOSCOPY;  Surgeon: Norleen LOISE Kiang, MD;  Location: [facility];  Service: Endoscopy;  Laterality: N/A;   NO PAST SURGERIES      SOCIAL HISTORY: Social History   Socioeconomic History   Marital status: Single    Spouse name: Not on file   Number of children: 0   Years of education: Not on file   Highest education level: Some college, no degree  Occupational History   Occupation: call center rep   Occupation: call center  Tobacco Use   Smoking status: Never   Smokeless tobacco: Never  Vaping Use   Vaping status: Not on file  Substance and Sexual Activity   Alcohol use: No   Drug use: No   Sexual activity: Not Currently  Other Topics Concern   Not on file  Social History Narrative   Not on file   Social Drivers of Health   Financial Resource Strain: Low Risk  (09/29/2023)   Overall Financial Resource Strain (CARDIA)    Difficulty of Paying Living Expenses: Not  very hard  Recent Concern: Physicist, Medical Strain - Medium Risk (07/08/2023)   Overall Financial Resource Strain (CARDIA)    Difficulty of Paying Living Expenses: Somewhat hard  Food Insecurity: No Food Insecurity (09/29/2023)   Hunger Vital Sign    Worried About Running Out of Food in the Last Year: Never true    Ran Out of Food in the Last Year: Never true  Recent Concern: Food Insecurity - Food Insecurity Present (07/05/2023)   Hunger Vital Sign    Worried About Running Out of Food in the Last Year: Never true    Ran Out of Food in the Last  Year: Sometimes true  Transportation Needs: No Transportation Needs (09/29/2023)   PRAPARE - Administrator, Civil Service (Medical): No    Lack of Transportation (Non-Medical): No  Physical Activity: Insufficiently Active (09/29/2023)   Exercise Vital Sign    Days of Exercise per Week: 3 days    Minutes of Exercise per Session: 30 min  Stress: Stress Concern Present (09/29/2023)   [employer] of Occupational Health - Occupational Stress Questionnaire    Feeling of Stress : To some extent  Social Connections: Moderately Integrated (09/29/2023)   Social Connection and Isolation Panel [NHANES]    Frequency of Communication with Friends and Family: More than three times a week    Frequency of Social Gatherings with Friends and Family: Once a week    Attends Religious Services: More than 4 times per year    Active Member of [employer] or Organizations: No    Attends Engineer, Structural: 1 to 4 times per year    Marital Status: Never married  Recent Concern: Social Connections - Moderately Isolated (07/05/2023)   Social Connection and Isolation Panel [NHANES]    Frequency of Communication with Friends and Family: More than three times a week    Frequency of Social Gatherings with Friends and Family: Twice a week    Attends Religious Services: More than 4 times per year    Active Member of [employer] or Organizations: No    Attends Engineer, Structural: Not on file    Marital Status: Never married  Catering Manager Violence: Not on file    FAMILY HISTORY: Family History  Problem Relation Age of Onset   Diabetes Mother    Diabetes Father    Pulmonary embolism Father    Cancer Maternal Grandmother    Alzheimer's disease Paternal Grandmother    Breast cancer Cousin    Colon cancer Neg Hx    Esophageal cancer Neg Hx    Stomach cancer Neg Hx     ALLERGIES:  is allergic to lisinopril -hydrochlorothiazide .  MEDICATIONS:  Current Outpatient Medications   Medication Sig Dispense Refill   acetaminophen  (TYLENOL ) 500 MG tablet Take 1 tablet (500 mg total) by mouth every 6 (six) hours as needed. 30 tablet 0   amLODipine  (NORVASC ) 10 MG tablet TAKE 1 TABLET BY MOUTH EVERY DAY 90 tablet 1   amoxicillin -clavulanate (AUGMENTIN ) 875-125 MG tablet Take 1 tablet by mouth every 12 (twelve) hours. 14 tablet 0   atorvastatin  (LIPITOR) 40 MG tablet TAKE 1 TABLET BY MOUTH EVERY DAY 90 tablet 1   Blood Glucose Monitoring Suppl (ONETOUCH VERIO REFLECT) w/Device KIT Check blood sugar TID E11.69 1 kit 0   carvedilol  (COREG ) 25 MG tablet TAKE 1 TABLET BY MOUTH TWICE A DAY 180 tablet 1   Cyanocobalamin  (B-12) 1000 MCG CAPS Take 1 tablet daily 90 capsule 1  ferrous sulfate  325 (65 FE) MG EC tablet Take 1 tablet (325 mg total) by mouth daily. Take with a source of Vitamin C 90 tablet 1   folic acid  (FOLVITE ) 1 MG tablet Take 1 tablet (1 mg total) by mouth daily. 90 tablet 1   hydrALAZINE  (APRESOLINE ) 50 MG tablet TAKE 1 TABLET BY MOUTH EVERY 8 HOURS. 270 tablet 0   hydrocortisone  (ANUSOL -HC) 25 MG suppository Place 1 suppository (25 mg total) rectally at bedtime. 30 suppository 3   Insulin  Pen Needle (BD PEN NEEDLE NANO U/F) 32G X 4 MM MISC 10 Units by Does not apply route daily. 100 each 3   Lancets (ONETOUCH DELICA PLUS LANCET33G) MISC CHECK BLOOD SUGAR 3 TIMES A DAY 100 each 2   metFORMIN  (GLUCOPHAGE ) 500 MG tablet TAKE 2 TABLETS (1,000 MG TOTAL) BY MOUTH 2 (TWO) TIMES DAILY WITH A MEAL. 360 tablet 0   ondansetron  (ZOFRAN ) 4 MG tablet Take 1 tablet (4 mg total) by mouth every 8 (eight) hours as needed for nausea or vomiting. 4 tablet 0   Riociguat  (ADEMPAS ) 1 MG TABS Rx # 1: Take 1 tab by mouth three times daily x 2 weeks. If systolic BP > 95, then increase to 1.5 tabs three times daily x 2 weeks 105 tablet 0   Riociguat  (ADEMPAS ) 2 MG TABS Rx # 2: Take 1 tab by mouth three times daily x 2 weeks 42 tablet 0   tirzepatide  (MOUNJARO ) 12.5 MG/0.5ML Pen Inject 12.5 mg  into the skin once a week. 6 mL 6   warfarin (COUMADIN ) 10 MG tablet Take 1/2 tablet on Monday, Wednesday, and Friday. Take 1 tablet all other days. 30 tablet 2   Current Facility-Administered Medications  Medication Dose Route Frequency Provider Last Rate Last Admin   dexamethasone  (DECADRON ) injection 4 mg  4 mg Intra-articular Once Sikora, Rebecca, DPM        REVIEW OF SYSTEMS:   Constitutional: ( - ) fevers, ( - )  chills , ( - ) night sweats Eyes: ( - ) blurriness of vision, ( - ) double vision, ( - ) watery eyes Ears, nose, mouth, throat, and face: ( - ) mucositis, ( - ) sore throat Respiratory: ( - ) cough, ( + ) dyspnea, ( - ) wheezes Cardiovascular: ( - ) palpitation, ( - ) chest discomfort, ( - ) lower extremity swelling Gastrointestinal:  ( - ) nausea, ( - ) heartburn, ( - ) change in bowel habits Skin: ( - ) abnormal skin rashes Lymphatics: ( - ) new lymphadenopathy, ( - ) easy bruising Neurological: ( - ) numbness, ( - ) tingling, ( - ) new weaknesses Behavioral/Psych: ( - ) mood change, ( - ) new changes  All other systems were reviewed with the patient and are negative.  PHYSICAL EXAMINATION: ECOG PERFORMANCE STATUS: 1 - Symptomatic but completely ambulatory  There were no vitals filed for this visit.    There were no vitals filed for this visit.     GENERAL: well appearing male in NAD, obese SKIN: skin color, texture, turgor are normal, no rashes or significant lesions EYES: conjunctiva are pink and non-injected, sclera clear LUNGS: clear to auscultation and percussion with normal breathing effort HEART: regular rate & rhythm and no murmurs and no lower extremity edema Musculoskeletal: no cyanosis of digits and no clubbing  PSYCH: alert & oriented x 3, fluent speech NEURO: no focal motor/sensory deficits  LABORATORY DATA:  I have reviewed the data as listed  Latest Ref Rng & Units 10/07/2023    8:15 AM 07/07/2023    8:42 AM 03/26/2023    8:45 AM  CBC   WBC 4.0 - 10.5 K/uL 6.8  5.2  6.3   Hemoglobin 13.0 - 17.0 g/dL 88.1  88.5  88.9   Hematocrit 39.0 - 52.0 % 37.4  36.8  35.0   Platelets 150 - 400 K/uL 237  273  246        Latest Ref Rng & Units 07/07/2023    8:42 AM 03/26/2023    8:45 AM 12/17/2022    8:22 AM  CMP  Glucose 70 - 99 mg/dL 85  92  93   BUN 6 - 20 mg/dL 18  20  23    Creatinine 0.61 - 1.24 mg/dL 8.79  8.58  8.46   Sodium 135 - 145 mmol/L 140  139  138   Potassium 3.5 - 5.1 mmol/L 4.1  4.3  4.3   Chloride 98 - 111 mmol/L 112  111  108   CO2 22 - 32 mmol/L 22  22  24    Calcium  8.9 - 10.3 mg/dL 8.9  8.9  9.0   Total Protein 6.5 - 8.1 g/dL 7.1  6.8  7.4   Total Bilirubin <1.2 mg/dL 0.3  0.3  0.3   Alkaline Phos 38 - 126 U/L 70  78  82   AST 15 - 41 U/L 12  11  10    ALT 0 - 44 U/L 13  15  13      ASSESSMENT & PLAN LANDERS PRAJAPATI is a 38 y.o. male who returns for a follow up for history of bilateral pulmonary emboli and left lower extremity DVT.   #Bilateral PE and left leg DVT: --Likely provoking factors include pneumonia and prolonged immobility due to sedentary lifestyle.  --Currently on coumadin , INR in therapeutic range.  --Hypercoagulable workup from November 2022 was unremarkable. --Patient continues to be sedentary and has evidence of residual pulmonary embolism seen on NM perfusion scan from February 2023. He is following with [facility] for consideration of CTEPH therapies --Recommend to continue on anticoagulation in the setting of continued sedentary lifestyle, persistent dyspnea on exertion and residual PE.  --Labs today show white blood cell count 6.8, Hgb 11.8, MCV 79.6, Plt 237 --RTC in 3 months with labs (normally q 6 months but returning for anemia)  #Microcytic Anemia, improving -- Hemoglobin 11.8 -- Patient is not taking his folic acid  or iron pills.  Encouraged him to pick them up and start these today.  This is the third visit where he has failed to pick up and take these medications.  #Plantar  Fascitis -- Patient wakes up in the morning with pain in his right foot that improves throughout the day. -- Findings sound consistent with plantar fasciitis. -- Recommend conservative methods such as morning stretches with a tennis ball/baseball or water bottles.  #Cord-like nodularity involving R upper extremity-resolved: --PICC line was placed on R upper extremity during recent hospitalization --Doppler ultrasound of the right upper extremity from 08/13/2021 showed no evidence of DVT in the R upper extremity.   No orders of the defined types were placed in this encounter.   All questions were answered. The patient knows to call the clinic with any problems, questions or concerns.  I have spent a total of 30 minutes minutes of face-to-face and non-face-to-face time, preparing to see the patient, performing a medically appropriate examination, counseling and educating the patient, ordering tests, documenting clinical  information in the electronic health record,  and care coordination.   Norleen IVAR Kidney, MD Department of Hematology/Oncology [facility] Phone: [phone] Pager: [phone] Email: [email or web address]

## 2023-10-07 NOTE — Telephone Encounter (Signed)
-----   Message from Norleen ONEIDA Kidney IV sent at 10/07/2023  3:27 PM EST ----- Please let William Moss know that his iron, vitamin B12, and folate are all critically low.  We have called in oral supplementation for all 3 of these but he has not picked them up.  Please assure that he has picked these up and started taking them.  In the event that he is unable to acquire and take these pills we could set him up for vitamin B12 IM injections and iron infusions to assure he is given the nutritional supplementation he requires. ----- Message ----- From: Rebecka, Lab In [place] Sent: 10/07/2023   8:45 AM EST To: Norleen ONEIDA Kidney [place], MD

## 2023-10-07 NOTE — Telephone Encounter (Signed)
 TCT patient regarding recent lab results. No answer but was able to leave detailed message on his identified phone vm. Advised  that his iron, vitamin B12, and folate are all critically low. We have called in oral supplementation for all 3 of these but he has not picked them up. Please assure that he has picked these up and started taking them. In the event that he is unable to acquire and take these pills we could set him up for vitamin B12 IM injections and iron infusions to assure he is given the nutritional supplementation he requires.  Asked that pt return this call  to [phone] and to confirm he has picked up these 3 new prescriptions.

## 2023-10-08 ENCOUNTER — Encounter: Payer: Self-pay | Admitting: Family Medicine

## 2023-10-08 ENCOUNTER — Other Ambulatory Visit: Payer: Self-pay | Admitting: Family Medicine

## 2023-10-08 DIAGNOSIS — E1122 Type 2 diabetes mellitus with diabetic chronic kidney disease: Secondary | ICD-10-CM

## 2023-10-08 MED ORDER — METFORMIN HCL 500 MG PO TABS: 1000.0000 mg | ORAL_TABLET | Freq: Two times a day (BID) | ORAL | 0 refills | Status: DC

## 2023-10-11 LAB — METHYLMALONIC ACID, SERUM: Methylmalonic Acid, Quantitative: 245 nmol/L (ref 0–378)

## 2023-10-25 ENCOUNTER — Other Ambulatory Visit: Payer: Self-pay

## 2023-10-25 ENCOUNTER — Encounter: Payer: Self-pay | Admitting: Family Medicine

## 2023-10-25 ENCOUNTER — Ambulatory Visit: Payer: BC Managed Care – PPO | Attending: Family Medicine | Admitting: Family Medicine

## 2023-10-25 VITALS — BP 110/74 | HR 92 | Ht 68.0 in | Wt 337.4 lb

## 2023-10-25 DIAGNOSIS — Z23 Encounter for immunization: Secondary | ICD-10-CM | POA: Diagnosis not present

## 2023-10-25 DIAGNOSIS — I10 Essential (primary) hypertension: Secondary | ICD-10-CM

## 2023-10-25 DIAGNOSIS — N182 Chronic kidney disease, stage 2 (mild): Secondary | ICD-10-CM | POA: Diagnosis not present

## 2023-10-25 DIAGNOSIS — Z7984 Long term (current) use of oral hypoglycemic drugs: Secondary | ICD-10-CM

## 2023-10-25 DIAGNOSIS — Z7985 Long-term (current) use of injectable non-insulin antidiabetic drugs: Secondary | ICD-10-CM

## 2023-10-25 DIAGNOSIS — E1159 Type 2 diabetes mellitus with other circulatory complications: Secondary | ICD-10-CM

## 2023-10-25 DIAGNOSIS — Z794 Long term (current) use of insulin: Secondary | ICD-10-CM | POA: Diagnosis not present

## 2023-10-25 DIAGNOSIS — I2724 Chronic thromboembolic pulmonary hypertension: Secondary | ICD-10-CM | POA: Diagnosis not present

## 2023-10-25 DIAGNOSIS — E119 Type 2 diabetes mellitus without complications: Secondary | ICD-10-CM

## 2023-10-25 DIAGNOSIS — E1122 Type 2 diabetes mellitus with diabetic chronic kidney disease: Secondary | ICD-10-CM | POA: Diagnosis not present

## 2023-10-25 DIAGNOSIS — D649 Anemia, unspecified: Secondary | ICD-10-CM

## 2023-10-25 DIAGNOSIS — I82509 Chronic embolism and thrombosis of unspecified deep veins of unspecified lower extremity: Secondary | ICD-10-CM

## 2023-10-25 DIAGNOSIS — I2782 Chronic pulmonary embolism: Secondary | ICD-10-CM

## 2023-10-25 DIAGNOSIS — I82512 Chronic embolism and thrombosis of left femoral vein: Secondary | ICD-10-CM

## 2023-10-25 DIAGNOSIS — I152 Hypertension secondary to endocrine disorders: Secondary | ICD-10-CM

## 2023-10-25 DIAGNOSIS — Z7901 Long term (current) use of anticoagulants: Secondary | ICD-10-CM

## 2023-10-25 DIAGNOSIS — Z6841 Body Mass Index (BMI) 40.0 and over, adult: Secondary | ICD-10-CM

## 2023-10-25 LAB — POCT GLYCOSYLATED HEMOGLOBIN (HGB A1C): HbA1c, POC (controlled diabetic range): 5.7 % (ref 0.0–7.0)

## 2023-10-25 MED ORDER — AMLODIPINE BESYLATE 10 MG PO TABS: 10.0000 mg | ORAL_TABLET | Freq: Every day | ORAL | 1 refills | Status: DC

## 2023-10-25 MED ORDER — ATORVASTATIN CALCIUM 40 MG PO TABS: 40.0000 mg | ORAL_TABLET | Freq: Every day | ORAL | 1 refills | Status: DC

## 2023-10-25 MED ORDER — WARFARIN SODIUM 10 MG PO TABS: ORAL_TABLET | ORAL | 1 refills | Status: DC

## 2023-10-25 MED ORDER — TIRZEPATIDE 15 MG/0.5ML ~~LOC~~ SOAJ
15.0000 mg | SUBCUTANEOUS | 6 refills | Status: DC
  Filled 2023-10-25: qty 2, 28d supply, fill #0
  Filled 2023-11-18: qty 2, 28d supply, fill #1
  Filled 2023-12-17: qty 2, 28d supply, fill #2
  Filled 2024-01-13: qty 2, 28d supply, fill #3
  Filled 2024-02-09: qty 2, 28d supply, fill #4
  Filled 2024-03-10: qty 2, 28d supply, fill #5
  Filled 2024-04-07: qty 2, 28d supply, fill #6

## 2023-10-25 MED ORDER — HYDRALAZINE HCL 50 MG PO TABS: 50.0000 mg | ORAL_TABLET | Freq: Three times a day (TID) | ORAL | 0 refills | Status: DC

## 2023-10-25 MED ORDER — CARVEDILOL 25 MG PO TABS: 25.0000 mg | ORAL_TABLET | Freq: Two times a day (BID) | ORAL | 1 refills | Status: DC

## 2023-10-25 MED ORDER — METFORMIN HCL 500 MG PO TABS
500.0000 mg | ORAL_TABLET | Freq: Two times a day (BID) | ORAL | 1 refills | Status: DC
Start: 2023-10-25 — End: 2024-05-03

## 2023-10-25 NOTE — Patient Instructions (Signed)
 VISIT SUMMARY:  You had a follow-up appointment today to review your ongoing health conditions, including diabetes, chronic pulmonary embolism (PE) and deep vein thrombosis (DVT), and anemia. You have experienced significant weight loss and your diabetes management has shown improvement. We also discussed your recent ear infection, which has resolved, and your current treatment for anemia with Dr. Leonides Schanz. Your activity level remains good despite some limitations due to plantar fasciitis and a fractured bone in your foot.  YOUR PLAN:  -TYPE 2 DIABETES MELLITUS: Type 2 Diabetes Mellitus is a condition where your body does not use insulin properly, leading to high blood sugar levels. Your A1c has improved to 5.7 from 6.0, indicating better blood sugar control. We will reduce your Metformin to 1 tablet twice a day to prevent low blood sugar and increase your Mounjaro to 50mg  to help with further weight loss. A cholesterol panel has also been ordered.  -CHRONIC PE/DVT: Chronic pulmonary embolism (PE) and deep vein thrombosis (DVT) are conditions where blood clots form in the lungs and veins, respectively. You are taking Coumadin regularly and have no current issues with breathing. Continue taking Coumadin as prescribed and keep up with regular INR checks with your pharmacist.  -ANEMIA: Anemia is a condition where you do not have enough healthy red blood cells to carry adequate oxygen to your body's tissues. You are currently under the care of Dr. Leonides Schanz for this condition. Please continue your follow-ups with Dr. Leonides Schanz as scheduled.  -GENERAL HEALTH MAINTENANCE: For your general health, you received a pneumonia vaccine today. You also need to see an ophthalmologist for your annual diabetic eye exam. Please follow up in 6 months for your next routine check-up.  INSTRUCTIONS:  Please follow up in 6 months for your next routine check-up. Make sure to schedule an appointment with an ophthalmologist for your  annual diabetic eye exam. Continue your regular INR checks with your pharmacist and follow up with Dr. Leonides Schanz as scheduled for your anemia.

## 2023-10-25 NOTE — Progress Notes (Signed)
 Subjective:  Patient ID: William Moss, male    DOB: 02-Jun-1986  Age: 38 y.o. MRN: [id number]  CC: Medical Management of Chronic Issues   HPI [name] is a 38 y.o. year old male with a history of  hypertension, type 2 diabetes mellitus (A1c 6.0) , morbid obesity, GERD hospitalized for COVID-19 pneumonia in 06/2019, bilateral pulmonary embolism in 05/2021 s/p TPA, chronic thromboembolic pulmonary hypertension, chronic anemia.   Interval History: Discussed the use of AI scribe software for clinical note transcription with the patient, who gave verbal consent to proceed.  He presents for a routine follow-up .He reports a significant weight loss, from 376 lbs down to 337 lbs, which has been consistent and steady since 2024. He had an ear infection at the end of the previous year, which resolved with treatment. He is currently under the care of an oncologist, Dr. Leonides Schanz, for anemia. He has been taking Coumadin consistently for his chronic PE and DVT she is managed by our clinical pharmacist. He reports no current issues with breathing and feels it has improved significantly. 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 He is due for a pneumonia vaccine and an eye check-up for his diabetes, which has not been done in the last year due to an outstanding balance.     A1c is 5.7 and he is adherent with metformin and Mounjaro. Doses adherence with his antihypertensive and his statin.   Past Medical History:  Diagnosis Date   Atypical chest pain 05/27/2016   a. 05/2016: NST showing small area of moderare anteroapical ischemia b. 05/2016: cath showing tortous but normal cors which confirmed a false positive NST.   Diabetes mellitus without complication (HCC)    GERD (gastroesophageal reflux disease) 10/02/2016   Hypertension    Pulmonary embolism, bilateral (HCC)      Past Surgical History:  Procedure Laterality Date   CARDIAC CATHETERIZATION N/A 06/10/2016   Procedure: Left Heart Cath and Coronary Angiography;  Surgeon: Iran Ouch, MD;  Location: [facility];  Service: Cardiovascular;  Laterality: N/A;   COLONOSCOPY N/A 02/04/2016   Procedure: COLONOSCOPY;  Surgeon: Hilarie Fredrickson, MD;  Location: [facility];  Service: Endoscopy;  Laterality: N/A;   NO PAST SURGERIES      Family History  Problem Relation Age of Onset   Diabetes Mother    Diabetes Father    Pulmonary embolism Father    Cancer Maternal Grandmother    Alzheimer's disease Paternal Grandmother    Breast cancer Cousin    Colon cancer Neg Hx    Esophageal cancer Neg Hx    Stomach cancer Neg Hx     Social History   Socioeconomic History   Marital status: Single    Spouse name: Not on file   Number of children: 0   Years of education: Not on file   Highest education level: Some college, no degree  Occupational History   Occupation: call center rep   Occupation: call center  Tobacco Use   Smoking status: Never   Smokeless tobacco: Never  Vaping Use   Vaping status: Not on file  Substance and Sexual Activity   Alcohol use: No   Drug use: No   Sexual activity: Not Currently  Other Topics Concern   Not on file  Social History Narrative   Not on  file   Social Drivers of Health   Financial Resource Strain: Medium Risk (10/25/2023)   Overall Financial Resource Strain (CARDIA)    Difficulty of Paying Living Expenses: Somewhat hard  Food Insecurity: Food Insecurity Present (10/25/2023)   Hunger Vital Sign    Worried About Running Out of Food in the Last Year: Never true    Ran Out of Food in the Last Year: Sometimes true  Transportation Needs: No Transportation Needs (10/25/2023)   PRAPARE - Administrator, Civil Service (Medical): No    Lack of Transportation (Non-Medical): No  Physical Activity: Insufficiently Active (10/25/2023)   Exercise  Vital Sign    Days of Exercise per Week: 2 days    Minutes of Exercise per Session: 30 min  Stress: No Stress Concern Present (10/25/2023)   [employer] of Occupational Health - Occupational Stress Questionnaire    Feeling of Stress : Not at all  Recent Concern: Stress - Stress Concern Present (09/29/2023)   [employer] of Occupational Health - Occupational Stress Questionnaire    Feeling of Stress : To some extent  Social Connections: Moderately Integrated (10/25/2023)   Social Connection and Isolation Panel [NHANES]    Frequency of Communication with Friends and Family: More than three times a week    Frequency of Social Gatherings with Friends and Family: Twice a week    Attends Religious Services: 1 to 4 times per year    Active Member of [employer] or Organizations: Yes    Attends Banker Meetings: 1 to 4 times per year    Marital Status: Never married    Allergies  Allergen Reactions   Lisinopril-Hydrochlorothiazide Other (See Comments)    "Interfered with the pH level in the serum"  Acidosis type reaction    Outpatient Medications Prior to Visit  Medication Sig Dispense Refill   acetaminophen (TYLENOL) 500 MG tablet Take 1 tablet (500 mg total) by mouth every 6 (six) hours as needed. 30 tablet 0   Blood Glucose Monitoring Suppl (ONETOUCH VERIO REFLECT) w/Device KIT Check blood sugar TID E11.69 1 kit 0   ferrous sulfate 325 (65 FE) MG EC tablet Take 1 tablet (325 mg total) by mouth daily. Take with a source of Vitamin C 90 tablet 1   folic acid (FOLVITE) 1 MG tablet Take 1 tablet (1 mg total) by mouth daily. 90 tablet 1   Insulin Pen Needle (BD PEN NEEDLE NANO U/F) 32G X 4 MM MISC 10 Units by Does not apply route daily. 100 each 3   Lancets (ONETOUCH DELICA PLUS LANCET33G) MISC CHECK BLOOD SUGAR 3 TIMES A DAY 100 each 2   ondansetron (ZOFRAN) 4 MG tablet Take 1 tablet (4 mg total) by mouth every 8 (eight) hours as needed for nausea or vomiting. 4 tablet 0    tirzepatide (MOUNJARO) 12.5 MG/0.5ML Pen Inject 12.5 mg into the skin once a week. 6 mL 6   amLODipine (NORVASC) 10 MG tablet TAKE 1 TABLET BY MOUTH EVERY DAY 90 tablet 1   atorvastatin (LIPITOR) 40 MG tablet TAKE 1 TABLET BY MOUTH EVERY DAY 90 tablet 1   carvedilol (COREG) 25 MG tablet TAKE 1 TABLET BY MOUTH TWICE A DAY 180 tablet 1   hydrALAZINE (APRESOLINE) 50 MG tablet TAKE 1 TABLET BY MOUTH EVERY 8 HOURS. 270 tablet 0   metFORMIN (GLUCOPHAGE) 500 MG tablet Take 2 tablets (1,000 mg total) by mouth 2 (two) times daily with a meal. 120 tablet 0   warfarin (  COUMADIN) 10 MG tablet Take 1/2 tablet on Monday, Wednesday, and Friday. Take 1 tablet all other days. 30 tablet 2   amoxicillin-clavulanate (AUGMENTIN) 875-125 MG tablet Take 1 tablet by mouth every 12 (twelve) hours. (Patient not taking: Reported on 10/25/2023) 14 tablet 0   Cyanocobalamin (B-12) 1000 MCG CAPS Take 1 tablet daily (Patient not taking: Reported on 10/25/2023) 90 capsule 1   hydrocortisone (ANUSOL-HC) 25 MG suppository Place 1 suppository (25 mg total) rectally at bedtime. (Patient not taking: Reported on 10/25/2023) 30 suppository 3   Riociguat (ADEMPAS) 1 MG TABS Rx # 1: Take 1 tab by mouth three times daily x 2 weeks. If systolic BP > 95, then increase to 1.5 tabs three times daily x 2 weeks (Patient not taking: Reported on 10/25/2023) 105 tablet 0   Riociguat (ADEMPAS) 2 MG TABS Rx # 2: Take 1 tab by mouth three times daily x 2 weeks (Patient not taking: Reported on 10/25/2023) 42 tablet 0   Facility-Administered Medications Prior to Visit  Medication Dose Route Frequency Provider Last Rate Last Admin   dexamethasone (DECADRON) injection 4 mg  4 mg Intra-articular Once Louann Sjogren, DPM         ROS Review of Systems  Constitutional:  Negative for activity change and appetite change.  HENT:  Negative for sinus pressure and sore throat.   Respiratory:  Negative for chest tightness, shortness of breath and wheezing.    Cardiovascular:  Negative for chest pain and palpitations.  Gastrointestinal:  Negative for abdominal distention, abdominal pain and constipation.  Genitourinary: Negative.   Musculoskeletal: Negative.   Psychiatric/Behavioral:  Negative for behavioral problems and dysphoric mood.     Objective:  BP 110/74   Pulse 92   Ht 5\' 8"  (1.727 m)   Wt (!) 337 lb 6.4 oz (153 kg)   SpO2 96%   BMI 51.30 kg/m      10/25/2023    8:58 AM 08/08/2023    3:19 PM 07/08/2023    8:52 AM  BP/Weight  Systolic BP 110 144 124  Diastolic BP 74 83 83  Wt. (Lbs) 337.4 342 343.4  BMI 51.3 kg/m2 52 kg/m2 50.71 kg/m2      Physical Exam Constitutional:      Appearance: He is well-developed. He is obese.  Cardiovascular:     Rate and Rhythm: Normal rate.     Heart sounds: Normal heart sounds. No murmur heard. Pulmonary:     Effort: Pulmonary effort is normal.     Breath sounds: Normal breath sounds. No wheezing or rales.  Chest:     Chest wall: No tenderness.  Abdominal:     General: Bowel sounds are normal. There is no distension.     Palpations: Abdomen is soft. There is no mass.     Tenderness: There is no abdominal tenderness.  Musculoskeletal:        General: Normal range of motion.     Right lower leg: No edema.     Left lower leg: No edema.  Neurological:     Mental Status: He is alert and oriented to person, place, and time.  Psychiatric:        Mood and Affect: Mood normal.        Latest Ref Rng & Units 10/07/2023    8:15 AM 07/07/2023    8:42 AM 03/26/2023    8:45 AM  CMP  Glucose 70 - 99 mg/dL 92  85  92   BUN 6 - 20 mg/dL  19  18  20    Creatinine 0.61 - 1.24 mg/dL 4.09  8.11  9.14   Sodium 135 - 145 mmol/L 137  140  139   Potassium 3.5 - 5.1 mmol/L 4.2  4.1  4.3   Chloride 98 - 111 mmol/L 108  112  111   CO2 22 - 32 mmol/L 22  22  22    Calcium 8.9 - 10.3 mg/dL 8.7  8.9  8.9   Total Protein 6.5 - 8.1 g/dL 7.2  7.1  6.8   Total Bilirubin 0.0 - 1.2 mg/dL 0.4  0.3  0.3    Alkaline Phos 38 - 126 U/L 77  70  78   AST 15 - 41 U/L 10  12  11    ALT 0 - 44 U/L 12  13  15      Lipid Panel     Component Value Date/Time   CHOL 126 12/21/2022 0919   TRIG 124 12/21/2022 0919   HDL 40 12/21/2022 0919   CHOLHDL 3.1 04/17/2021 1011   CHOLHDL 3.5 06/29/2019 0005   VLDL 17 06/29/2019 0005   LDLCALC 64 12/21/2022 0919    CBC    Component Value Date/Time   WBC 6.8 10/07/2023 0815   WBC 12.0 (H) 09/21/2021 1958   RBC 4.62 10/07/2023 0816   RBC 4.70 10/07/2023 0815   HGB 11.8 (L) 10/07/2023 0815   HGB 11.1 (L) 10/29/2022 0853   HCT 37.4 (L) 10/07/2023 0815   HCT 34.6 (L) 10/29/2022 0853   PLT 237 10/07/2023 0815   PLT 278 10/29/2022 0853   MCV 79.6 (L) 10/07/2023 0815   MCV 79 10/29/2022 0853   MCH 25.1 (L) 10/07/2023 0815   MCHC 31.6 10/07/2023 0815   RDW 16.6 (H) 10/07/2023 0815   RDW 15.8 (H) 10/29/2022 0853   LYMPHSABS 2.0 10/07/2023 0815   LYMPHSABS 1.8 10/29/2022 0853   MONOABS 0.7 10/07/2023 0815   EOSABS 0.2 10/07/2023 0815   EOSABS 0.3 10/29/2022 0853   BASOSABS 0.0 10/07/2023 0815   BASOSABS 0.0 10/29/2022 0853    Lab Results  Component Value Date   HGBA1C 5.7 10/25/2023   Lab Results  Component Value Date   INR 3.9 09/30/2023   INR 2.6 08/30/2023   INR 4.1 08/10/2023    Assessment & Plan:      Type 2 Diabetes Mellitus A1c improved to 5.7 from 6.0. Patient has lost weight and is on Metformin and Mounjaro. Concern for hypoglycemia with current Metformin dose. -Reduce Metformin to 1 tablet BID. -Increase Mounjaro to 15mg  for additional weight benefit. -Order cholesterol panel.  Chronic PE/DVT/chronic thromboembolic pulmonary hypertension Patient is on Coumadin and reports consistent use. No current issues with shortness of breath. Patient is mobile and active. -Continue Coumadin as prescribed. -Continue regular INR checks with pharmacist; INR was supratherapeutic at 3.9  Anemia -Last hemoglobin was 11.6, continue ferrous  sulfate Patient is under the care of an oncologist, Dr. Leonides Schanz. -Continue follow-up with Dr. Leonides Schanz as scheduled.  Hypertension -Control -Continue antihypertensives  Morbid obesity -He has lost over 40 pounds in the last 1 year -Continue Mounjaro which has been increased from 12.5 mg to 15 mg today -Continue caloric restriction, exercise   General Health Maintenance -Administer pneumonia vaccine today. -Referral to ophthalmologist for annual diabetic eye exam. -Follow-up in 6 months.          Meds ordered this encounter  Medications   amLODipine (NORVASC) 10 MG tablet    Sig: Take 1 tablet (10  mg total) by mouth daily.    Dispense:  90 tablet    Refill:  1   atorvastatin (LIPITOR) 40 MG tablet    Sig: Take 1 tablet (40 mg total) by mouth daily.    Dispense:  90 tablet    Refill:  1   carvedilol (COREG) 25 MG tablet    Sig: Take 1 tablet (25 mg total) by mouth 2 (two) times daily.    Dispense:  180 tablet    Refill:  1   hydrALAZINE (APRESOLINE) 50 MG tablet    Sig: Take 1 tablet (50 mg total) by mouth every 8 (eight) hours.    Dispense:  270 tablet    Refill:  0   metFORMIN (GLUCOPHAGE) 500 MG tablet    Sig: Take 1 tablet (500 mg total) by mouth 2 (two) times daily with a meal.    Dispense:  180 tablet    Refill:  1    Dose decrease   warfarin (COUMADIN) 10 MG tablet    Sig: Take 1/2 tablet on Monday, Wednesday, and Friday. Take 1 tablet all other days.    Dispense:  90 tablet    Refill:  1    Follow-up: Return in about 6 months (around 04/23/2024) for Chronic medical conditions.       Hoy Register, MD, FAAFP. [facility] [place], [place] [phone]   10/25/2023, 9:18 AM

## 2023-10-26 ENCOUNTER — Other Ambulatory Visit: Payer: Self-pay | Admitting: Family Medicine

## 2023-10-26 ENCOUNTER — Encounter: Payer: Self-pay | Admitting: Family Medicine

## 2023-10-26 DIAGNOSIS — Z794 Long term (current) use of insulin: Secondary | ICD-10-CM

## 2023-10-26 LAB — LP+NON-HDL CHOLESTEROL
Cholesterol, Total: 197 mg/dL (ref 100–199)
HDL: 39 mg/dL — ABNORMAL LOW (ref >39)
LDL Chol Calc (NIH): 126 mg/dL — ABNORMAL HIGH (ref 0–99)
Total Non-HDL-Chol (LDL+VLDL): 158 mg/dL — ABNORMAL HIGH (ref 0–129)
Triglycerides: 182 mg/dL — ABNORMAL HIGH (ref 0–149)
VLDL Cholesterol Cal: 32 mg/dL (ref 5–40)

## 2023-10-26 MED ORDER — ATORVASTATIN CALCIUM 80 MG PO TABS: 80.0000 mg | ORAL_TABLET | Freq: Every day | ORAL | 1 refills | Status: DC

## 2023-10-28 ENCOUNTER — Ambulatory Visit: Payer: BC Managed Care – PPO | Attending: Family Medicine | Admitting: Pharmacist

## 2023-10-28 DIAGNOSIS — I2782 Chronic pulmonary embolism: Secondary | ICD-10-CM

## 2023-10-28 DIAGNOSIS — I82512 Chronic embolism and thrombosis of left femoral vein: Secondary | ICD-10-CM

## 2023-10-28 LAB — POCT INR: POC INR: 1.9

## 2023-11-24 ENCOUNTER — Other Ambulatory Visit: Payer: Self-pay

## 2023-11-29 ENCOUNTER — Ambulatory Visit: Payer: BC Managed Care – PPO | Attending: Family Medicine | Admitting: Pharmacist

## 2023-11-29 DIAGNOSIS — I2724 Chronic thromboembolic pulmonary hypertension: Secondary | ICD-10-CM

## 2023-11-29 LAB — POCT INR: POC INR: 1.8

## 2023-12-23 ENCOUNTER — Other Ambulatory Visit: Payer: Self-pay

## 2023-12-30 ENCOUNTER — Ambulatory Visit: Attending: Family Medicine | Admitting: Pharmacist

## 2023-12-30 DIAGNOSIS — I2724 Chronic thromboembolic pulmonary hypertension: Secondary | ICD-10-CM | POA: Diagnosis not present

## 2023-12-30 DIAGNOSIS — I82512 Chronic embolism and thrombosis of left femoral vein: Secondary | ICD-10-CM

## 2023-12-30 DIAGNOSIS — I2699 Other pulmonary embolism without acute cor pulmonale: Secondary | ICD-10-CM | POA: Diagnosis not present

## 2023-12-30 LAB — POCT INR: POC INR: 2.1

## 2024-01-19 ENCOUNTER — Encounter: Payer: Self-pay | Admitting: Family Medicine

## 2024-01-20 ENCOUNTER — Other Ambulatory Visit: Payer: Self-pay | Admitting: Family Medicine

## 2024-01-20 ENCOUNTER — Other Ambulatory Visit: Payer: Self-pay

## 2024-01-20 DIAGNOSIS — E1159 Type 2 diabetes mellitus with other circulatory complications: Secondary | ICD-10-CM

## 2024-01-25 ENCOUNTER — Telehealth: Payer: Self-pay | Admitting: Family Medicine

## 2024-01-25 DIAGNOSIS — I82512 Chronic embolism and thrombosis of left femoral vein: Secondary | ICD-10-CM

## 2024-01-25 MED ORDER — WARFARIN SODIUM 10 MG PO TABS: ORAL_TABLET | ORAL | 1 refills | Status: DC

## 2024-01-25 NOTE — Telephone Encounter (Signed)
 Routing to pharmacy([facility])

## 2024-01-25 NOTE — Addendum Note (Signed)
 Addended by: Freada Jacobs, Mara [place] L on: 01/25/2024 06:02 PM   Modules accepted: Orders

## 2024-01-25 NOTE — Telephone Encounter (Signed)
Yes ma'am, rxn sent. 

## 2024-01-25 NOTE — Telephone Encounter (Signed)
Pt requesting refill on warfarin

## 2024-01-26 ENCOUNTER — Encounter (INDEPENDENT_AMBULATORY_CARE_PROVIDER_SITE_OTHER): Payer: Self-pay

## 2024-01-26 ENCOUNTER — Telehealth: Payer: Self-pay | Admitting: Pharmacist

## 2024-01-26 NOTE — Telephone Encounter (Signed)
 Patient has a Coumadin  Clinic appt with me on 5/30 that he is requesting to reschedule. Can we contact him to reschedule?

## 2024-01-26 NOTE — Telephone Encounter (Signed)
Patient has been sent a MyChart message.

## 2024-01-28 ENCOUNTER — Ambulatory Visit: Admitting: Pharmacist

## 2024-02-14 ENCOUNTER — Ambulatory Visit: Admitting: Podiatry

## 2024-02-14 ENCOUNTER — Ambulatory Visit (INDEPENDENT_AMBULATORY_CARE_PROVIDER_SITE_OTHER)

## 2024-02-14 ENCOUNTER — Encounter: Payer: Self-pay | Admitting: Podiatry

## 2024-02-14 ENCOUNTER — Other Ambulatory Visit: Payer: Self-pay

## 2024-02-14 DIAGNOSIS — M722 Plantar fascial fibromatosis: Secondary | ICD-10-CM

## 2024-02-14 MED ORDER — MELOXICAM 15 MG PO TABS: 15.0000 mg | ORAL_TABLET | Freq: Every day | ORAL | 0 refills | Status: DC

## 2024-02-14 NOTE — Progress Notes (Signed)
  Subjective:  Patient ID: William Moss, male    DOB: 1985/11/15,   MRN: [id number]  Chief Complaint  Patient presents with   Plantar Fasciitis    Rm10/plantar fasciitis flare right foot/15months/aching,sore, throbbing, hurts with pressure.    38 y.o. male presents for concern of right foot plantar fasciitis flare. Relates this has been ongoing for two months but worse the past week and a half. He has had soreness in the heel throbbing and pain when walking. Relates more pain as the day goes on and pain with first steps. He has tried some Rom exercises but denies any other treatments. Patient is diabetic and last A1c was  Lab Results  Component Value Date   HGBA1C 5.7 10/25/2023   .   PCP:  Newlin, Enobong, MD     . Denies any other pedal complaints. Denies n/v/f/c.   Past Medical History:  Diagnosis Date   Atypical chest pain 05/27/2016   a. 05/2016: NST showing small area of moderare anteroapical ischemia b. 05/2016: cath showing tortous but normal cors which confirmed a false positive NST.   Diabetes mellitus without complication (HCC)    GERD (gastroesophageal reflux disease) 10/02/2016   Hypertension    Pulmonary embolism, bilateral (HCC)     Objective:  Physical Exam: Vascular: DP/PT pulses 2/4 bilateral. CFT <3 seconds. Normal hair growth on digits. No edema.  Skin. No lacerations or abrasions bilateral feet.  Musculoskeletal: MMT 5/5 bilateral lower extremities in DF, PF, Inversion and Eversion. Deceased ROM in DF of ankle joint. Tender to the medial calcaneal tubercle right . More pain to lateral plantar tubercle and along lateral arch and plantar fascia.  No pain with achilles, PT or arch. No pain with calcaneal squeeze.  Neurological: Sensation intact to light touch.   Assessment:   1. Plantar fasciitis, right      Plan:  Patient was evaluated and treated and all questions answered. Discussed plantar fasciitis with patient.  X-rays reviewed and discussed with  patient. No acute fractures or dislocations noted. Mild spurring noted at inferior calcaneus.  Discussed treatment options including, ice, NSAIDS, supportive shoes, bracing, and stretching. Stretching exercises provided to be done on a daily basis.   Prescription for meloxicam provided and sent to pharmacy. Pf brace dipsensed.    Follow-up 6 weeks or sooner if any problems arise. In the meantime, encouraged to call the office with any questions, concerns, change in symptoms.      Jennefer Moats, DPM

## 2024-02-14 NOTE — Patient Instructions (Signed)

## 2024-02-24 ENCOUNTER — Ambulatory Visit: Attending: Family Medicine | Admitting: Pharmacist

## 2024-02-24 DIAGNOSIS — I82512 Chronic embolism and thrombosis of left femoral vein: Secondary | ICD-10-CM

## 2024-02-24 DIAGNOSIS — I2699 Other pulmonary embolism without acute cor pulmonale: Secondary | ICD-10-CM

## 2024-02-24 DIAGNOSIS — I2724 Chronic thromboembolic pulmonary hypertension: Secondary | ICD-10-CM | POA: Diagnosis not present

## 2024-02-24 LAB — POCT INR: POC INR: 2.5

## 2024-03-14 ENCOUNTER — Other Ambulatory Visit: Payer: Self-pay | Admitting: Podiatry

## 2024-03-16 ENCOUNTER — Other Ambulatory Visit: Payer: Self-pay

## 2024-03-23 ENCOUNTER — Other Ambulatory Visit: Payer: Self-pay

## 2024-03-27 ENCOUNTER — Encounter: Payer: Self-pay | Admitting: Podiatry

## 2024-03-27 ENCOUNTER — Ambulatory Visit: Admitting: Podiatry

## 2024-03-27 DIAGNOSIS — M722 Plantar fascial fibromatosis: Secondary | ICD-10-CM

## 2024-03-27 MED ORDER — TRIAMCINOLONE ACETONIDE 10 MG/ML IJ SUSP
2.5000 mg | Freq: Once | INTRAMUSCULAR | Status: AC
Start: 2024-03-27 — End: 2024-03-27
  Administered 2024-03-27: 2.5 mg via INTRA_ARTICULAR

## 2024-03-27 MED ORDER — DEXAMETHASONE SODIUM PHOSPHATE 120 MG/30ML IJ SOLN
4.0000 mg | Freq: Once | INTRAMUSCULAR | Status: AC
  Administered 2024-03-27: 4 mg via INTRA_ARTICULAR

## 2024-03-27 NOTE — Progress Notes (Signed)
  Subjective:  Patient ID: William Moss, male    DOB: 03/19/1986,   MRN: [id number]  Chief Complaint  Patient presents with   Plantar Fasciitis    It's hurting.  The last couple of days, it's been like a numbing pain.    38 y.o. male presents for follow-up of right plantar fasciitis. Relates still hurting and worsening the past couple of days. Relates numbness in the heel as well. He has been stretching and wearing the brace and meloxicam  was helping for a little while.   Patient is diabetic and last A1c was  Lab Results  Component Value Date   HGBA1C 5.7 10/25/2023   .   PCP:  Newlin, Enobong, MD     . Denies any other pedal complaints. Denies n/v/f/c.   Past Medical History:  Diagnosis Date   Atypical chest pain 05/27/2016   a. 05/2016: NST showing small area of moderare anteroapical ischemia b. 05/2016: cath showing tortous but normal cors which confirmed a false positive NST.   Diabetes mellitus without complication (HCC)    GERD (gastroesophageal reflux disease) 10/02/2016   Hypertension    Pulmonary embolism, bilateral (HCC)     Objective:  Physical Exam: Vascular: DP/PT pulses 2/4 bilateral. CFT <3 seconds. Normal hair growth on digits. No edema.  Skin. No lacerations or abrasions bilateral feet.  Musculoskeletal: MMT 5/5 bilateral lower extremities in DF, PF, Inversion and Eversion. Deceased ROM in DF of ankle joint. Tender to the medial calcaneal tubercle right . More pain to lateral plantar tubercle and along lateral arch and plantar fascia.  No pain with achilles, PT or arch. No pain with calcaneal squeeze.  Neurological: Sensation intact to light touch.   Assessment:   1. Plantar fasciitis, right       Plan:  Patient was evaluated and treated and all questions answered. Discussed plantar fasciitis with patient.  X-rays reviewed and discussed with patient. No acute fractures or dislocations noted. Mild spurring noted at inferior calcaneus.  Discussed  treatment options including, ice, NSAIDS, supportive shoes, bracing, and stretching. Continue brace and stretching.  Continue meloxicam .  Injection offered today. Procedure below Follow-up 6 weeks or sooner if any problems arise. In the meantime, encouraged to call the office with any questions, concerns, change in symptoms.   Procedure: Injection Tendon/Ligament Discussed alternatives, risks, complications and verbal consent was obtained.  Location: Right plantar fascia . Skin Prep: Alcohol. Injectate: 1cc 0.5% marcaine plain, 1 cc dexamethasone  0.5 cc kenalog   Disposition: Patient tolerated procedure well. Injection site dressed with a band-aid.  Post-injection care was discussed and return precautions discussed.     Asberry Failing, DPM

## 2024-03-29 ENCOUNTER — Telehealth: Payer: Self-pay | Admitting: Family Medicine

## 2024-03-29 NOTE — Telephone Encounter (Signed)
 Relayed message to patient

## 2024-03-29 NOTE — Telephone Encounter (Signed)
 Confirmed appt for 7*/31

## 2024-03-30 ENCOUNTER — Ambulatory Visit: Attending: Family Medicine | Admitting: Pharmacist

## 2024-03-30 ENCOUNTER — Telehealth: Payer: Self-pay | Admitting: Family Medicine

## 2024-03-30 DIAGNOSIS — I82512 Chronic embolism and thrombosis of left femoral vein: Secondary | ICD-10-CM

## 2024-03-30 LAB — POCT INR: POC INR: 2.8

## 2024-03-30 NOTE — Telephone Encounter (Signed)
 Patient has dropped off FMLA paperwork. Paperwork has been placed in the providers mailbox for review.

## 2024-04-03 ENCOUNTER — Encounter: Payer: Self-pay | Admitting: Family Medicine

## 2024-04-03 ENCOUNTER — Telehealth (HOSPITAL_BASED_OUTPATIENT_CLINIC_OR_DEPARTMENT_OTHER): Admitting: Family Medicine

## 2024-04-03 DIAGNOSIS — E1122 Type 2 diabetes mellitus with diabetic chronic kidney disease: Secondary | ICD-10-CM

## 2024-04-03 DIAGNOSIS — I2782 Chronic pulmonary embolism: Secondary | ICD-10-CM

## 2024-04-03 DIAGNOSIS — Z029 Encounter for administrative examinations, unspecified: Secondary | ICD-10-CM | POA: Diagnosis not present

## 2024-04-03 DIAGNOSIS — N182 Chronic kidney disease, stage 2 (mild): Secondary | ICD-10-CM

## 2024-04-03 DIAGNOSIS — Z794 Long term (current) use of insulin: Secondary | ICD-10-CM

## 2024-04-03 NOTE — Progress Notes (Signed)
 Virtual Visit via Video Note  I connected with William Moss, on 04/03/2024 at 8:21 AM by video enabled telemedicine device and verified that I am speaking with the correct person using two identifiers.   Consent: I discussed the limitations, risks, security and privacy concerns of performing an evaluation and management service by telemedicine and the availability of in person appointments. I also discussed with the patient that there may be a patient responsible charge related to this service. The patient expressed understanding and agreed to proceed.   Location of Patient: Home  Location of Provider: Clinic   Persons participating in Telemedicine visit: William Moss Dr. Delbert    Discussed the use of AI scribe software for clinical note transcription with the patient, who gave verbal consent to proceed.  History of Present Illness William Moss is a 38 year old male with a history of  hypertension, type 2 diabetes mellitus (A1c 6.0) , morbid obesity, GERD hospitalized for COVID-19 pneumonia in 06/2019, bilateral pulmonary embolism in 05/2021 s/p TPA, chronic thromboembolic pulmonary hypertension, chronic anemia who presents for [facility] paperwork completion for intermittent leave.  He has diabetes, diagnosed in 2016, requiring regular management with medical appointments every three to six months with his primary care provider and monthly with another healthcare provider. He estimates attending two medical appointments per month. He also requires follow-up with a hematologist approximately every six months due to a history of chronic PE, with hospitalization in 2022.      Past Medical History:  Diagnosis Date   Atypical chest pain 05/27/2016   a. 05/2016: NST showing small area of moderare anteroapical ischemia b. 05/2016: cath showing tortous but normal cors which confirmed a false positive NST.   Diabetes mellitus without complication (HCC)    GERD (gastroesophageal reflux  disease) 10/02/2016   Hypertension    Pulmonary embolism, bilateral (HCC)    Allergies  Allergen Reactions   Lisinopril -Hydrochlorothiazide  Other (See Comments)    Interfered with the pH level in the serum  Acidosis type reaction    Current Outpatient Medications on File Prior to Visit  Medication Sig Dispense Refill   acetaminophen  (TYLENOL ) 500 MG tablet Take 1 tablet (500 mg total) by mouth every 6 (six) hours as needed. 30 tablet 0   amLODipine  (NORVASC ) 10 MG tablet Take 1 tablet (10 mg total) by mouth daily. 90 tablet 1   amoxicillin -clavulanate (AUGMENTIN ) 875-125 MG tablet Take 1 tablet by mouth every 12 (twelve) hours. (Patient not taking: Reported on 03/27/2024) 14 tablet 0   atorvastatin  (LIPITOR) 80 MG tablet Take 1 tablet (80 mg total) by mouth daily. 90 tablet 1   Blood Glucose Monitoring Suppl (ONETOUCH VERIO REFLECT) w/Device KIT Check blood sugar TID E11.69 1 kit 0   carvedilol  (COREG ) 25 MG tablet Take 1 tablet (25 mg total) by mouth 2 (two) times daily. 180 tablet 1   Cyanocobalamin  (B-12) 1000 MCG CAPS Take 1 tablet daily (Patient not taking: Reported on 03/27/2024) 90 capsule 1   ferrous sulfate  325 (65 FE) MG EC tablet Take 1 tablet (325 mg total) by mouth daily. Take with a source of Vitamin C 90 tablet 1   folic acid  (FOLVITE ) 1 MG tablet Take 1 tablet (1 mg total) by mouth daily. 90 tablet 1   hydrALAZINE  (APRESOLINE ) 50 MG tablet TAKE 1 TABLET BY MOUTH EVERY 8 HOURS. 270 tablet 0   hydrocortisone  (ANUSOL -HC) 25 MG suppository Place 1 suppository (25 mg total) rectally at bedtime. (Patient not taking:  Reported on 03/27/2024) 30 suppository 3   Insulin  Pen Needle (BD PEN NEEDLE NANO U/F) 32G X 4 MM MISC 10 Units by Does not apply route daily. 100 each 3   Lancets (ONETOUCH DELICA PLUS LANCET33G) MISC CHECK BLOOD SUGAR 3 TIMES A DAY 100 each 2   meloxicam  (MOBIC ) 15 MG tablet TAKE 1 TABLET (15 MG TOTAL) BY MOUTH DAILY. 30 tablet 0   metFORMIN  (GLUCOPHAGE ) 500 MG  tablet Take 1 tablet (500 mg total) by mouth 2 (two) times daily with a meal. 180 tablet 1   ondansetron  (ZOFRAN ) 4 MG tablet Take 1 tablet (4 mg total) by mouth every 8 (eight) hours as needed for nausea or vomiting. 4 tablet 0   Riociguat  (ADEMPAS ) 1 MG TABS Rx # 1: Take 1 tab by mouth three times daily x 2 weeks. If systolic BP > 95, then increase to 1.5 tabs three times daily x 2 weeks (Patient not taking: Reported on 03/27/2024) 105 tablet 0   Riociguat  (ADEMPAS ) 2 MG TABS Rx # 2: Take 1 tab by mouth three times daily x 2 weeks (Patient not taking: Reported on 03/27/2024) 42 tablet 0   tirzepatide  (MOUNJARO ) 15 MG/0.5ML Pen Inject 15 mg into the skin once a week. 6 mL 6   warfarin (COUMADIN ) 10 MG tablet Take 1/2 tablet on Monday, Wednesday, and Friday. Take 1 tablet all other days. 90 tablet 1   [DISCONTINUED] lisinopril -hydrochlorothiazide  (ZESTORETIC ) 20-12.5 MG tablet Take 2 tablets by mouth daily. 180 tablet 1   Current Facility-Administered Medications on File Prior to Visit  Medication Dose Route Frequency Provider Last Rate Last Admin   dexamethasone  (DECADRON ) injection 4 mg  4 mg Intra-articular Once Sikora, Rebecca, DPM        ROS: See HPI  Observations/Objective: Awake, alert, oriented x3 Not in acute distress Normal mood      Latest Ref Rng & Units 10/07/2023    8:15 AM 07/07/2023    8:42 AM 03/26/2023    8:45 AM  CMP  Glucose 70 - 99 mg/dL 92  85  92   BUN 6 - 20 mg/dL 19  18  20    Creatinine 0.61 - 1.24 mg/dL 8.70  8.79  8.58   Sodium 135 - 145 mmol/L 137  140  139   Potassium 3.5 - 5.1 mmol/L 4.2  4.1  4.3   Chloride 98 - 111 mmol/L 108  112  111   CO2 22 - 32 mmol/L 22  22  22    Calcium  8.9 - 10.3 mg/dL 8.7  8.9  8.9   Total Protein 6.5 - 8.1 g/dL 7.2  7.1  6.8   Total Bilirubin 0.0 - 1.2 mg/dL 0.4  0.3  0.3   Alkaline Phos 38 - 126 U/L 77  70  78   AST 15 - 41 U/L 10  12  11    ALT 0 - 44 U/L 12  13  15      Lipid Panel     Component Value Date/Time   CHOL  197 10/25/2023 0939   TRIG 182 (H) 10/25/2023 0939   HDL 39 (L) 10/25/2023 0939   CHOLHDL 3.1 04/17/2021 1011   CHOLHDL 3.5 06/29/2019 0005   VLDL 17 06/29/2019 0005   LDLCALC 126 (H) 10/25/2023 0939   LABVLDL 32 10/25/2023 0939    Lab Results  Component Value Date   HGBA1C 5.7 10/25/2023     Assessment and plan:   Assessment & Plan Type 2 diabetes mellitus/history of  encounter Chronic condition since 2016, requiring regular monitoring and management. - Complete FMLA paperwork for intermittent leave for medical appointments and treatments related to diabetes management.  Chronic pulmonary embolism Chronic condition since 2022, requiring ongoing management. - Complete FMLA paperwork for intermittent leave for medical appointments and treatments related to chronic pulmonary embolism management.     No orders of the defined types were placed in this encounter.   Follow Up Instructions: Keep upcoming appointment for chronic disease management   I discussed the assessment and treatment plan with the patient. The patient was provided an opportunity to ask questions and all were answered. The patient agreed with the plan and demonstrated an understanding of the instructions.   The patient was advised to call back or seek an in-person evaluation if the symptoms worsen or if the condition fails to improve as anticipated.     I provided 13 minutes total of Telehealth time during this encounter including median intraservice time, reviewing previous notes, investigations, ordering medications, medical decision making, coordinating care and patient verbalized understanding at the end of the visit.     Corrina Sabin, MD, FAAFP. [facility] [place], [place] [phone]   04/03/2024, 8:21 AM

## 2024-04-06 ENCOUNTER — Other Ambulatory Visit: Payer: Self-pay | Admitting: Hematology and Oncology

## 2024-04-06 ENCOUNTER — Other Ambulatory Visit: Payer: Self-pay | Admitting: Family Medicine

## 2024-04-06 ENCOUNTER — Inpatient Hospital Stay (HOSPITAL_BASED_OUTPATIENT_CLINIC_OR_DEPARTMENT_OTHER): Payer: BC Managed Care – PPO | Admitting: Hematology and Oncology

## 2024-04-06 ENCOUNTER — Inpatient Hospital Stay: Payer: BC Managed Care – PPO | Attending: Hematology and Oncology

## 2024-04-06 ENCOUNTER — Telehealth: Payer: Self-pay | Admitting: *Deleted

## 2024-04-06 VITALS — BP 185/105 | HR 78 | Temp 97.6°F | Resp 15 | Wt 354.2 lb

## 2024-04-06 DIAGNOSIS — Z86718 Personal history of other venous thrombosis and embolism: Secondary | ICD-10-CM | POA: Insufficient documentation

## 2024-04-06 DIAGNOSIS — E1122 Type 2 diabetes mellitus with diabetic chronic kidney disease: Secondary | ICD-10-CM

## 2024-04-06 DIAGNOSIS — D500 Iron deficiency anemia secondary to blood loss (chronic): Secondary | ICD-10-CM | POA: Diagnosis not present

## 2024-04-06 DIAGNOSIS — I2699 Other pulmonary embolism without acute cor pulmonale: Secondary | ICD-10-CM | POA: Diagnosis not present

## 2024-04-06 DIAGNOSIS — Z7901 Long term (current) use of anticoagulants: Secondary | ICD-10-CM | POA: Diagnosis not present

## 2024-04-06 DIAGNOSIS — D509 Iron deficiency anemia, unspecified: Secondary | ICD-10-CM | POA: Diagnosis not present

## 2024-04-06 DIAGNOSIS — Z803 Family history of malignant neoplasm of breast: Secondary | ICD-10-CM | POA: Insufficient documentation

## 2024-04-06 DIAGNOSIS — I82512 Chronic embolism and thrombosis of left femoral vein: Secondary | ICD-10-CM

## 2024-04-06 DIAGNOSIS — M722 Plantar fascial fibromatosis: Secondary | ICD-10-CM | POA: Diagnosis not present

## 2024-04-06 LAB — CMP (CANCER CENTER ONLY)
ALT: 13 U/L (ref 0–44)
AST: 13 U/L — ABNORMAL LOW (ref 15–41)
Albumin: 3.8 g/dL (ref 3.5–5.0)
Alkaline Phosphatase: 81 U/L (ref 38–126)
Anion gap: 5 (ref 5–15)
BUN: 22 mg/dL — ABNORMAL HIGH (ref 6–20)
CO2: 24 mmol/L (ref 22–32)
Calcium: 8.6 mg/dL — ABNORMAL LOW (ref 8.9–10.3)
Chloride: 109 mmol/L (ref 98–111)
Creatinine: 1.28 mg/dL — ABNORMAL HIGH (ref 0.61–1.24)
GFR, Estimated: >60 mL/min (ref >60)
Glucose, Bld: 100 mg/dL — ABNORMAL HIGH (ref 70–99)
Potassium: 4 mmol/L (ref 3.5–5.1)
Sodium: 138 mmol/L (ref 135–145)
Total Bilirubin: 0.4 mg/dL (ref 0.0–1.2)
Total Protein: 7.1 g/dL (ref 6.5–8.1)

## 2024-04-06 LAB — RETIC PANEL
Immature Retic Fract: 24.6 % — ABNORMAL HIGH (ref 2.3–15.9)
RBC.: 4.73 MIL/uL (ref 4.22–5.81)
Retic Count, Absolute: 71.4 K/uL (ref 19.0–186.0)
Retic Ct Pct: 1.5 % (ref 0.4–3.1)
Reticulocyte Hemoglobin: 27.8 pg — ABNORMAL LOW (ref >27.9)

## 2024-04-06 LAB — IRON AND IRON BINDING CAPACITY (CC-WL,HP ONLY)
Iron: 47 ug/dL (ref 45–182)
Saturation Ratios: 13 % — ABNORMAL LOW (ref 17.9–39.5)
TIBC: 377 ug/dL (ref 250–450)
UIBC: 330 ug/dL (ref 117–376)

## 2024-04-06 LAB — CBC WITH DIFFERENTIAL (CANCER CENTER ONLY)
Abs Immature Granulocytes: 0.02 K/uL (ref 0.00–0.07)
Basophils Absolute: 0 K/uL (ref 0.0–0.1)
Basophils Relative: 1 %
Eosinophils Absolute: 0.1 K/uL (ref 0.0–0.5)
Eosinophils Relative: 2 %
HCT: 36.6 % — ABNORMAL LOW (ref 39.0–52.0)
Hemoglobin: 11.9 g/dL — ABNORMAL LOW (ref 13.0–17.0)
Immature Granulocytes: 0 %
Lymphocytes Relative: 31 %
Lymphs Abs: 2 K/uL (ref 0.7–4.0)
MCH: 25.2 pg — ABNORMAL LOW (ref 26.0–34.0)
MCHC: 32.5 g/dL (ref 30.0–36.0)
MCV: 77.5 fL — ABNORMAL LOW (ref 80.0–100.0)
Monocytes Absolute: 0.7 K/uL (ref 0.1–1.0)
Monocytes Relative: 10 %
Neutro Abs: 3.6 K/uL (ref 1.7–7.7)
Neutrophils Relative %: 56 %
Platelet Count: 253 K/uL (ref 150–400)
RBC: 4.72 MIL/uL (ref 4.22–5.81)
RDW: 16.2 % — ABNORMAL HIGH (ref 11.5–15.5)
WBC Count: 6.4 K/uL (ref 4.0–10.5)
nRBC: 0 % (ref 0.0–0.2)

## 2024-04-06 LAB — FOLATE: Folate: 6.4 ng/mL (ref >5.9)

## 2024-04-06 LAB — VITAMIN B12: Vitamin B-12: 136 pg/mL — ABNORMAL LOW (ref 180–914)

## 2024-04-06 LAB — FERRITIN: Ferritin: 8 ng/mL — ABNORMAL LOW (ref 24–336)

## 2024-04-06 NOTE — Telephone Encounter (Signed)
 TCT patient regarding lab results from today.  No answer but was able to leave vm message on his identified phone vm ,advising that his Iron and B12 levels remain low as he has not been taking his iron and B12 supplements. Advised that we could try IV iron and IM B12 to give him a boost but he would still need to take the supplements. Advised to return this call to [phone].

## 2024-04-06 NOTE — Progress Notes (Signed)
 [facility] Telephone:([phone]   [phone]  PROGRESS NOTE  Patient Care Team: Delbert Clam, MD as PCP - General (Family Medicine) Raford Riggs, MD as PCP - Cardiology (Cardiology)  CHIEF COMPLAINTS/PURPOSE OF CONSULTATION:  History of bilateral pulmonary emboli and left lower extremity DVT  HISTORY OF PRESENTING ILLNESS:  William Moss 38 y.o. male returns for a follow up for history of bilateral pulmonary emboli and left lower extremity DVT. He continues on anticoagulation therapy with coumadin .   Ms. Milewski reports he has been well overall in interim since her last visit.  He reports he is been feeling fine with good energy and strong appetite.  He reports his Coumadin  levels have been steady typically in the 2.5-2.8 range.  He reports that he has not gone off track.  He knows he sees the INR clinic approximately once per month.  He reports that he has had no overt signs of bleeding, bruising, or dark stools.  He denies any blood in the urine or stool.  He reports that he did however slip and fall this morning on a tree root on his brush out of the house.  Additionally he forgot to take his blood pressure medications.  He notes he is doing his best to try to eat iron rich foods such as red meat and veggies but has not picked up his iron pills.  He is not having any lightheadedness, dizziness, shortness of breath.  He denies any headaches.  He reports work is going well.  He continues to struggle with his plantar fasciitis and recently received a prescription for Aloxi cam as well as a steroid shot.  He has no upcoming planned surgeries or dental work.  He has no other signs or symptoms concerning for recurrent VTE.  A full 10 point ROS is otherwise negative.  MEDICAL HISTORY:  Past Medical History:  Diagnosis Date   Atypical chest pain 05/27/2016   a. 05/2016: NST showing small area of moderare anteroapical ischemia b. 05/2016: cath showing tortous but  normal cors which confirmed a false positive NST.   Diabetes mellitus without complication (HCC)    GERD (gastroesophageal reflux disease) 10/02/2016   Hypertension    Pulmonary embolism, bilateral (HCC)     SURGICAL HISTORY: Past Surgical History:  Procedure Laterality Date   CARDIAC CATHETERIZATION N/A 06/10/2016   Procedure: Left Heart Cath and Coronary Angiography;  Surgeon: Deatrice DELENA Cage, MD;  Location: [facility] INVASIVE CV LAB;  Service: Cardiovascular;  Laterality: N/A;   COLONOSCOPY N/A 02/04/2016   Procedure: COLONOSCOPY;  Surgeon: Norleen LOISE Kiang, MD;  Location: [facility] ENDOSCOPY;  Service: Endoscopy;  Laterality: N/A;   NO PAST SURGERIES      SOCIAL HISTORY: Social History   Socioeconomic History   Marital status: Single    Spouse name: Not on file   Number of children: 0   Years of education: Not on file   Highest education level: Some college, no degree  Occupational History   Occupation: call center rep   Occupation: call center  Tobacco Use   Smoking status: Never   Smokeless tobacco: Never  Vaping Use   Vaping status: Not on file  Substance and Sexual Activity   Alcohol use: No   Drug use: No   Sexual activity: Not Currently  Other Topics Concern   Not on file  Social History Narrative   Not on file   Social Drivers of Health   Financial Resource Strain: Medium Risk (10/25/2023)  Overall Financial Resource Strain (CARDIA)    Difficulty of Paying Living Expenses: Somewhat hard  Food Insecurity: Food Insecurity Present (10/25/2023)   Hunger Vital Sign    Worried About Running Out of Food in the Last Year: Never true    Ran Out of Food in the Last Year: Sometimes true  Transportation Needs: No Transportation Needs (10/25/2023)   PRAPARE - Administrator, Civil Service (Medical): No    Lack of Transportation (Non-Medical): No  Physical Activity: Insufficiently Active (10/25/2023)   Exercise Vital Sign    Days of Exercise per Week: 2 days    Minutes of  Exercise per Session: 30 min  Stress: No Stress Concern Present (10/25/2023)   [employer] of Occupational Health - Occupational Stress Questionnaire    Feeling of Stress : Not at all  Recent Concern: Stress - Stress Concern Present (09/29/2023)   [employer] of Occupational Health - Occupational Stress Questionnaire    Feeling of Stress : To some extent  Social Connections: Moderately Integrated (10/25/2023)   Social Connection and Isolation Panel    Frequency of Communication with Friends and Family: More than three times a week    Frequency of Social Gatherings with Friends and Family: Twice a week    Attends Religious Services: 1 to 4 times per year    Active Member of [employer] or Organizations: Yes    Attends Banker Meetings: 1 to 4 times per year    Marital Status: Never married  Intimate Partner Violence: Not At Risk (10/25/2023)   Humiliation, Afraid, Rape, and Kick questionnaire    Fear of Current or Ex-Partner: No    Emotionally Abused: No    Physically Abused: No    Sexually Abused: No    FAMILY HISTORY: Family History  Problem Relation Age of Onset   Diabetes Mother    Diabetes Father    Pulmonary embolism Father    Cancer Maternal Grandmother    Alzheimer's disease Paternal Grandmother    Breast cancer Cousin    Colon cancer Neg Hx    Esophageal cancer Neg Hx    Stomach cancer Neg Hx     ALLERGIES:  is allergic to lisinopril -hydrochlorothiazide .  MEDICATIONS:  Current Outpatient Medications  Medication Sig Dispense Refill   acetaminophen  (TYLENOL ) 500 MG tablet Take 1 tablet (500 mg total) by mouth every 6 (six) hours as needed. 30 tablet 0   amLODipine  (NORVASC ) 10 MG tablet Take 1 tablet (10 mg total) by mouth daily. 90 tablet 1   amoxicillin -clavulanate (AUGMENTIN ) 875-125 MG tablet Take 1 tablet by mouth every 12 (twelve) hours. (Patient not taking: Reported on 03/27/2024) 14 tablet 0   atorvastatin  (LIPITOR) 80 MG tablet Take 1 tablet  (80 mg total) by mouth daily. 90 tablet 1   Blood Glucose Monitoring Suppl (ONETOUCH VERIO REFLECT) w/Device KIT Check blood sugar TID E11.69 1 kit 0   carvedilol  (COREG ) 25 MG tablet Take 1 tablet (25 mg total) by mouth 2 (two) times daily. 180 tablet 1   Cyanocobalamin  (B-12) 1000 MCG CAPS Take 1 tablet daily (Patient not taking: Reported on 03/27/2024) 90 capsule 1   ferrous sulfate  325 (65 FE) MG EC tablet Take 1 tablet (325 mg total) by mouth daily. Take with a source of Vitamin C 90 tablet 1   folic acid  (FOLVITE ) 1 MG tablet Take 1 tablet (1 mg total) by mouth daily. 90 tablet 1   hydrALAZINE  (APRESOLINE ) 50 MG tablet TAKE 1 TABLET BY  MOUTH EVERY 8 HOURS. 270 tablet 0   hydrocortisone  (ANUSOL -HC) 25 MG suppository Place 1 suppository (25 mg total) rectally at bedtime. (Patient not taking: Reported on 03/27/2024) 30 suppository 3   Insulin  Pen Needle (BD PEN NEEDLE NANO U/F) 32G X 4 MM MISC 10 Units by Does not apply route daily. 100 each 3   Lancets (ONETOUCH DELICA PLUS LANCET33G) MISC CHECK BLOOD SUGAR 3 TIMES A DAY 100 each 2   meloxicam  (MOBIC ) 15 MG tablet TAKE 1 TABLET (15 MG TOTAL) BY MOUTH DAILY. 30 tablet 0   metFORMIN  (GLUCOPHAGE ) 500 MG tablet Take 1 tablet (500 mg total) by mouth 2 (two) times daily with a meal. 180 tablet 1   ondansetron  (ZOFRAN ) 4 MG tablet Take 1 tablet (4 mg total) by mouth every 8 (eight) hours as needed for nausea or vomiting. 4 tablet 0   Riociguat  (ADEMPAS ) 1 MG TABS Rx # 1: Take 1 tab by mouth three times daily x 2 weeks. If systolic BP > 95, then increase to 1.5 tabs three times daily x 2 weeks (Patient not taking: Reported on 03/27/2024) 105 tablet 0   Riociguat  (ADEMPAS ) 2 MG TABS Rx # 2: Take 1 tab by mouth three times daily x 2 weeks (Patient not taking: Reported on 03/27/2024) 42 tablet 0   tirzepatide  (MOUNJARO ) 15 MG/0.5ML Pen Inject 15 mg into the skin once a week. 6 mL 6   warfarin (COUMADIN ) 10 MG tablet Take 1/2 tablet on Monday, Wednesday, and  Friday. Take 1 tablet all other days. 90 tablet 1   Current Facility-Administered Medications  Medication Dose Route Frequency Provider Last Rate Last Admin   dexamethasone  (DECADRON ) injection 4 mg  4 mg Intra-articular Once Sikora, Rebecca, DPM        REVIEW OF SYSTEMS:   Constitutional: ( - ) fevers, ( - )  chills , ( - ) night sweats Eyes: ( - ) blurriness of vision, ( - ) double vision, ( - ) watery eyes Ears, nose, mouth, throat, and face: ( - ) mucositis, ( - ) sore throat Respiratory: ( - ) cough, ( + ) dyspnea, ( - ) wheezes Cardiovascular: ( - ) palpitation, ( - ) chest discomfort, ( - ) lower extremity swelling Gastrointestinal:  ( - ) nausea, ( - ) heartburn, ( - ) change in bowel habits Skin: ( - ) abnormal skin rashes Lymphatics: ( - ) new lymphadenopathy, ( - ) easy bruising Neurological: ( - ) numbness, ( - ) tingling, ( - ) new weaknesses Behavioral/Psych: ( - ) mood change, ( - ) new changes  All other systems were reviewed with the patient and are negative.  PHYSICAL EXAMINATION: ECOG PERFORMANCE STATUS: 1 - Symptomatic but completely ambulatory  Vitals:   04/06/24 0851  BP: (!) 185/105  Pulse: 78  Resp: 15  Temp: 97.6 F (36.4 C)  SpO2: 99%      Filed Weights   04/06/24 0851  Weight: (!) 354 lb 3.2 oz (160.7 kg)       GENERAL: well appearing male in NAD, obese SKIN: skin color, texture, turgor are normal, no rashes or significant lesions EYES: conjunctiva are pink and non-injected, sclera clear LUNGS: clear to auscultation and percussion with normal breathing effort HEART: regular rate & rhythm and no murmurs and no lower extremity edema Musculoskeletal: no cyanosis of digits and no clubbing  PSYCH: alert & oriented x 3, fluent speech NEURO: no focal motor/sensory deficits  LABORATORY DATA:  I have  reviewed the data as listed    Latest Ref Rng & Units 04/06/2024    8:07 AM 10/07/2023    8:15 AM 07/07/2023    8:42 AM  CBC  WBC 4.0 - 10.5 K/uL  6.4  6.8  5.2   Hemoglobin 13.0 - 17.0 g/dL 88.0  88.1  88.5   Hematocrit 39.0 - 52.0 % 36.6  37.4  36.8   Platelets 150 - 400 K/uL 253  237  273        Latest Ref Rng & Units 04/06/2024    8:07 AM 10/07/2023    8:15 AM 07/07/2023    8:42 AM  CMP  Glucose 70 - 99 mg/dL 899  92  85   BUN 6 - 20 mg/dL 22  19  18    Creatinine 0.61 - 1.24 mg/dL 8.71  8.70  8.79   Sodium 135 - 145 mmol/L 138  137  140   Potassium 3.5 - 5.1 mmol/L 4.0  4.2  4.1   Chloride 98 - 111 mmol/L 109  108  112   CO2 22 - 32 mmol/L 24  22  22    Calcium  8.9 - 10.3 mg/dL 8.6  8.7  8.9   Total Protein 6.5 - 8.1 g/dL 7.1  7.2  7.1   Total Bilirubin 0.0 - 1.2 mg/dL 0.4  0.4  0.3   Alkaline Phos 38 - 126 U/L 81  77  70   AST 15 - 41 U/L 13  10  12    ALT 0 - 44 U/L 13  12  13      ASSESSMENT & PLAN URA HAUSEN is a 38 y.o. male who returns for a follow up for history of bilateral pulmonary emboli and left lower extremity DVT.   #Bilateral PE and left leg DVT: --Likely provoking factors include pneumonia and prolonged immobility due to sedentary lifestyle.  --Currently on coumadin , INR in therapeutic range.  --Hypercoagulable workup from November 2022 was unremarkable. --Patient continues to be sedentary and has evidence of residual pulmonary embolism seen on NM perfusion scan from February 2023. He is following with [facility] for consideration of CTEPH therapies --Recommend to continue on anticoagulation in the setting of continued sedentary lifestyle, persistent dyspnea on exertion and residual PE.  --Labs today show white blood cell count 6.4, hemoglobin 11.9, MCV 77.5, platelets 253 --RTC in 6 months with labs  #Microcytic Anemia, improving -- Hemoglobin 11.9  -- Patient is not taking his folic acid  or iron pills.  He has not picked up and taken these medications over the last 2 years. We have encouraged him to do so  #Plantar Fascitis -- Patient wakes up in the morning with pain in his right foot that improves  throughout the day. -- Findings sound consistent with plantar fasciitis. -- Recommend conservative methods such as morning stretches with a tennis ball/baseball or water bottles. -- He has received steroid shots and was prescribed meloxicam .  #Cord-like nodularity involving R upper extremity-resolved: --PICC line was placed on R upper extremity during recent hospitalization --Doppler ultrasound of the right upper extremity from 08/13/2021 showed no evidence of DVT in the R upper extremity.   No orders of the defined types were placed in this encounter.   All questions were answered. The patient knows to call the clinic with any problems, questions or concerns.  I have spent a total of 30 minutes minutes of face-to-face and non-face-to-face time, preparing to see the patient, performing a medically appropriate examination, counseling and  educating the patient, ordering tests, documenting clinical information in the electronic health record,  and care coordination.   Norleen IVAR Kidney, MD Department of Hematology/Oncology [facility] Phone: [phone] Pager: [phone] Email: [email or web address]

## 2024-04-13 ENCOUNTER — Other Ambulatory Visit: Payer: Self-pay

## 2024-04-14 LAB — METHYLMALONIC ACID, SERUM: Methylmalonic Acid, Quantitative: 260 nmol/L (ref 0–378)

## 2024-04-18 ENCOUNTER — Other Ambulatory Visit: Payer: Self-pay

## 2024-04-20 ENCOUNTER — Other Ambulatory Visit: Payer: Self-pay | Admitting: Family Medicine

## 2024-04-20 DIAGNOSIS — E1159 Type 2 diabetes mellitus with other circulatory complications: Secondary | ICD-10-CM

## 2024-04-21 ENCOUNTER — Telehealth: Payer: Self-pay | Admitting: Family Medicine

## 2024-04-21 NOTE — Telephone Encounter (Signed)
 Pt unconfirmed appt 8/22 lvm

## 2024-04-24 ENCOUNTER — Ambulatory Visit: Payer: BC Managed Care – PPO | Attending: Family Medicine | Admitting: Family Medicine

## 2024-04-24 ENCOUNTER — Encounter: Payer: Self-pay | Admitting: Family Medicine

## 2024-04-24 VITALS — BP 138/86 | HR 89 | Ht 68.0 in | Wt 357.8 lb

## 2024-04-24 DIAGNOSIS — Z23 Encounter for immunization: Secondary | ICD-10-CM | POA: Diagnosis not present

## 2024-04-24 DIAGNOSIS — N182 Chronic kidney disease, stage 2 (mild): Secondary | ICD-10-CM

## 2024-04-24 DIAGNOSIS — Z1159 Encounter for screening for other viral diseases: Secondary | ICD-10-CM

## 2024-04-24 DIAGNOSIS — E785 Hyperlipidemia, unspecified: Secondary | ICD-10-CM

## 2024-04-24 DIAGNOSIS — Z7985 Long-term (current) use of injectable non-insulin antidiabetic drugs: Secondary | ICD-10-CM

## 2024-04-24 DIAGNOSIS — I2724 Chronic thromboembolic pulmonary hypertension: Secondary | ICD-10-CM

## 2024-04-24 DIAGNOSIS — I2782 Chronic pulmonary embolism: Secondary | ICD-10-CM

## 2024-04-24 DIAGNOSIS — E1169 Type 2 diabetes mellitus with other specified complication: Secondary | ICD-10-CM | POA: Diagnosis not present

## 2024-04-24 DIAGNOSIS — I129 Hypertensive chronic kidney disease with stage 1 through stage 4 chronic kidney disease, or unspecified chronic kidney disease: Secondary | ICD-10-CM

## 2024-04-24 DIAGNOSIS — Z794 Long term (current) use of insulin: Secondary | ICD-10-CM | POA: Diagnosis not present

## 2024-04-24 DIAGNOSIS — M25511 Pain in right shoulder: Secondary | ICD-10-CM

## 2024-04-24 DIAGNOSIS — E1122 Type 2 diabetes mellitus with diabetic chronic kidney disease: Secondary | ICD-10-CM

## 2024-04-24 LAB — POCT GLYCOSYLATED HEMOGLOBIN (HGB A1C): HbA1c, POC (controlled diabetic range): 5.7 % (ref 0.0–7.0)

## 2024-04-24 MED ORDER — CARVEDILOL 25 MG PO TABS: 25.0000 mg | ORAL_TABLET | Freq: Two times a day (BID) | ORAL | 1 refills | Status: AC

## 2024-04-24 MED ORDER — TIRZEPATIDE 15 MG/0.5ML ~~LOC~~ SOAJ: 15.0000 mg | SUBCUTANEOUS | 6 refills | Status: DC

## 2024-04-24 MED ORDER — AMLODIPINE BESYLATE 10 MG PO TABS: 10.0000 mg | ORAL_TABLET | Freq: Every day | ORAL | 1 refills | Status: AC

## 2024-04-24 NOTE — Patient Instructions (Signed)
Shoulder Pain Many things can cause shoulder pain, including: An injury to the shoulder. Overuse of the shoulder. Arthritis. The source of the pain can be: Inflammation. An injury to the shoulder joint. An injury to a tendon, ligament, or bone. Follow these instructions at home: Pay attention to changes in your symptoms. Let your health care provider know about them. Follow these instructions to relieve your pain. If you have a removable sling: Wear the sling as told by your provider. Remove it only as told by your provider. Check the skin around the sling every day. Tell your provider about any concerns. Loosen the sling if your fingers tingle, become numb, or become cold. Keep the sling clean. If the sling is not waterproof: Do not let it get wet. Remove it to shower or bathe. Move your arm as little as possible, but keep your hand moving to prevent swelling. Managing pain, stiffness, and swelling  If told, put ice on the painful area. If you have a removable sling or immobilizer, remove it as told by your provider. Put ice in a plastic bag. Place a towel between your skin and the bag. Leave the ice on for 20 minutes, 2-3 times a day. If your skin turns bright red, remove the ice right away to prevent skin damage. The risk of damage is higher if you cannot feel pain, heat, or cold. Move your fingers often to reduce stiffness and swelling. Squeeze a soft ball or a foam pad as much as possible. This helps to keep the shoulder from swelling. It also helps to strengthen the arm. General instructions Take over-the-counter and prescription medicines only as told by your provider. Exercise may help with pain management. Perform exercises if told by your provider. You may be referred to a physical therapist to help in your recovery process. Keep all follow-up visits in order to avoid any type of permanent shoulder disability or chronic pain problems. Contact a health care provider  if: Your pain is not relieved with medicines. New pain develops in your arm, hand, or fingers. You loosen your sling and your arm, hand, or fingers remain tingly, numb, swollen, or painful. Get help right away if: Your arm, hand, or fingers turn white or blue. This information is not intended to replace advice given to you by your health care provider. Make sure you discuss any questions you have with your health care provider. Document Revised: 03/20/2022 Document Reviewed: 03/20/2022 Elsevier Patient Education  2024 [employer]  

## 2024-04-24 NOTE — Progress Notes (Signed)
 Subjective:  Patient ID: William Moss, male    DOB: [birth year]  Age: 38 y.o. MRN: [id number]  CC: Medical Management of Chronic Issues (Discuss HPV vaccine)     Discussed the use of AI scribe software for clinical note transcription with the patient, who gave verbal consent to proceed.  History of Present Illness William Moss is a 38 year old male with a history of  hypertension, type 2 diabetes mellitus , morbid obesity, GERD, bilateral pulmonary embolism in 05/2021 s/p TPA, chronic thromboembolic pulmonary hypertension, chronic anemia who presents for discussion of the HPV vaccine and routine follow-up.  He is interested in receiving the HPV vaccine and does not recall having it previously. He has not been confirmed immune to hepatitis B.  He has experienced two falls this month, with the most recent fall three days ago causing soreness in his arm and shoulder, especially when lifting.  The initial fall was in the rain and most recently when he was stung by bee and was trying to avoid another sting.  He uses ice packs intermittently and reports no swelling but persistent soreness.  He manages his cholesterol with atorvastatin  and is on the maximum dose of Mounjaro  for diabetes. He has gained 20 pounds over the last six months and is adjusting his diet, particularly reducing red meat intake due to previously low iron levels. He is on warfarin due to his history of chronic PE. Adempas  was prescribed for his pulmonary hypertension however he never obtained this.  He has not seen his pulmonologist in 1 year.  He is experiencing a flare-up of plantar fasciitis since mid-July, despite being symptom-free for almost a year. He continues exercises, wears a brace, and applies ice as needed. No recent shortness of breath or numbness in his feet.    Past Medical History:  Diagnosis Date   Atypical chest pain 05/27/2016   a. 05/2016: NST showing small area of moderare anteroapical ischemia b.  05/2016: cath showing tortous but normal cors which confirmed a false positive NST.   Diabetes mellitus without complication (HCC)    GERD (gastroesophageal reflux disease) 10/02/2016   Hypertension    Pulmonary embolism, bilateral (HCC)     Past Surgical History:  Procedure Laterality Date   CARDIAC CATHETERIZATION N/A 06/10/2016   Procedure: Left Heart Cath and Coronary Angiography;  Surgeon: Deatrice DELENA Cage, MD;  Location: [facility];  Service: Cardiovascular;  Laterality: N/A;   COLONOSCOPY N/A 02/04/2016   Procedure: COLONOSCOPY;  Surgeon: Norleen LOISE Kiang, MD;  Location: [facility];  Service: Endoscopy;  Laterality: N/A;   NO PAST SURGERIES      Family History  Problem Relation Age of Onset   Diabetes Mother    Diabetes Father    Pulmonary embolism Father    Cancer Maternal Grandmother    Alzheimer's disease Paternal Grandmother    Breast cancer Cousin    Colon cancer Neg Hx    Esophageal cancer Neg Hx    Stomach cancer Neg Hx     Social History   Socioeconomic History   Marital status: Single    Spouse name: Not on file   Number of children: 0   Years of education: Not on file   Highest education level: Some college, no degree  Occupational History   Occupation: call center rep   Occupation: call center  Tobacco Use   Smoking status: Never   Smokeless tobacco: Never  Vaping Use   Vaping status: Not on  file  Substance and Sexual Activity   Alcohol use: No   Drug use: No   Sexual activity: Not Currently  Other Topics Concern   Not on file  Social History Narrative   Not on file   Social Drivers of Health   Financial Resource Strain: Low Risk  (04/21/2024)   Overall Financial Resource Strain (CARDIA)    Difficulty of Paying Living Expenses: Not very hard  Food Insecurity: No Food Insecurity (04/21/2024)   Hunger Vital Sign    Worried About Running Out of Food in the Last Year: Never true    Ran Out of Food in the Last Year: Never true   Transportation Needs: No Transportation Needs (04/21/2024)   PRAPARE - Administrator, Civil Service (Medical): No    Lack of Transportation (Non-Medical): No  Physical Activity: Insufficiently Active (04/21/2024)   Exercise Vital Sign    Days of Exercise per Week: 2 days    Minutes of Exercise per Session: 20 min  Stress: Stress Concern Present (04/21/2024)   [employer] of Occupational Health - Occupational Stress Questionnaire    Feeling of Stress: To some extent  Social Connections: Moderately Isolated (04/21/2024)   Social Connection and Isolation Panel    Frequency of Communication with Friends and Family: Three times a week    Frequency of Social Gatherings with Friends and Family: Once a week    Attends Religious Services: More than 4 times per year    Active Member of [employer] or Organizations: No    Attends Engineer, structural: Not on file    Marital Status: Never married    Allergies  Allergen Reactions   Lisinopril -Hydrochlorothiazide  Other (See Comments)    Interfered with the pH level in the serum  Acidosis type reaction    Outpatient Medications Prior to Visit  Medication Sig Dispense Refill   acetaminophen  (TYLENOL ) 500 MG tablet Take 1 tablet (500 mg total) by mouth every 6 (six) hours as needed. 30 tablet 0   atorvastatin  (LIPITOR) 80 MG tablet TAKE 1 TABLET BY MOUTH EVERY DAY 90 tablet 1   Blood Glucose Monitoring Suppl (ONETOUCH VERIO REFLECT) w/Device KIT Check blood sugar TID E11.69 1 kit 0   Cyanocobalamin  (B-12) 1000 MCG CAPS Take 1 tablet daily 90 capsule 1   ferrous sulfate  325 (65 FE) MG EC tablet Take 1 tablet (325 mg total) by mouth daily. Take with a source of Vitamin C 90 tablet 1   folic acid  (FOLVITE ) 1 MG tablet Take 1 tablet (1 mg total) by mouth daily. 90 tablet 1   hydrALAZINE  (APRESOLINE ) 50 MG tablet TAKE 1 TABLET BY MOUTH EVERY 8 HOURS. 270 tablet 1   hydrocortisone  (ANUSOL -HC) 25 MG suppository Place 1 suppository  (25 mg total) rectally at bedtime. 30 suppository 3   Insulin  Pen Needle (BD PEN NEEDLE NANO U/F) 32G X 4 MM MISC 10 Units by Does not apply route daily. 100 each 3   Lancets (ONETOUCH DELICA PLUS LANCET33G) MISC CHECK BLOOD SUGAR 3 TIMES A DAY 100 each 2   meloxicam  (MOBIC ) 15 MG tablet TAKE 1 TABLET (15 MG TOTAL) BY MOUTH DAILY. 30 tablet 0   metFORMIN  (GLUCOPHAGE ) 500 MG tablet Take 1 tablet (500 mg total) by mouth 2 (two) times daily with a meal. 180 tablet 1   ondansetron  (ZOFRAN ) 4 MG tablet Take 1 tablet (4 mg total) by mouth every 8 (eight) hours as needed for nausea or vomiting. 4 tablet 0  Riociguat  (ADEMPAS ) 1 MG TABS Rx # 1: Take 1 tab by mouth three times daily x 2 weeks. If systolic BP > 95, then increase to 1.5 tabs three times daily x 2 weeks 105 tablet 0   Riociguat  (ADEMPAS ) 2 MG TABS Rx # 2: Take 1 tab by mouth three times daily x 2 weeks 42 tablet 0   warfarin (COUMADIN ) 10 MG tablet TAKE 1/2 TABLET ON MONDAY, WEDNESDAY, AND FRIDAY. TAKE 1 TABLET ALL OTHER DAYS. 72 tablet 2   amLODipine  (NORVASC ) 10 MG tablet Take 1 tablet (10 mg total) by mouth daily. 90 tablet 1   carvedilol  (COREG ) 25 MG tablet Take 1 tablet (25 mg total) by mouth 2 (two) times daily. 180 tablet 1   tirzepatide  (MOUNJARO ) 15 MG/0.5ML Pen Inject 15 mg into the skin once a week. 6 mL 6   amoxicillin -clavulanate (AUGMENTIN ) 875-125 MG tablet Take 1 tablet by mouth every 12 (twelve) hours. (Patient not taking: Reported on 04/24/2024) 14 tablet 0   Facility-Administered Medications Prior to Visit  Medication Dose Route Frequency Provider Last Rate Last Admin   dexamethasone  (DECADRON ) injection 4 mg  4 mg Intra-articular Once Sikora, Rebecca, DPM         ROS Review of Systems  Constitutional:  Negative for activity change and appetite change.  HENT:  Negative for sinus pressure and sore throat.   Respiratory:  Negative for chest tightness, shortness of breath and wheezing.   Cardiovascular:  Negative for  chest pain and palpitations.  Gastrointestinal:  Negative for abdominal distention, abdominal pain and constipation.  Genitourinary: Negative.   Musculoskeletal:        See HPI  Psychiatric/Behavioral:  Negative for behavioral problems and dysphoric mood.     Objective:  BP 138/86   Pulse 89   Ht 5' 8 (1.727 m)   Wt (!) 357 lb 12.8 oz (162.3 kg)   SpO2 97%   BMI 54.40 kg/m      04/24/2024    9:01 AM 04/06/2024    8:51 AM 10/25/2023    8:58 AM  BP/Weight  Systolic BP 138 185 110  Diastolic BP 86 105 74  Wt. (Lbs) 357.8 354.2 337.4  BMI 54.4 kg/m2 53.86 kg/m2 51.3 kg/m2    Wt Readings from Last 3 Encounters:  04/24/24 (!) 357 lb 12.8 oz (162.3 kg)  04/06/24 (!) 354 lb 3.2 oz (160.7 kg)  10/25/23 (!) 337 lb 6.4 oz (153 kg)      Physical Exam Constitutional:      Appearance: He is well-developed. He is obese.  Cardiovascular:     Rate and Rhythm: Normal rate.     Heart sounds: Normal heart sounds. No murmur heard. Pulmonary:     Effort: Pulmonary effort is normal.     Breath sounds: Normal breath sounds. No wheezing or rales.  Chest:     Chest wall: No tenderness.  Abdominal:     General: Bowel sounds are normal. There is no distension.     Palpations: Abdomen is soft. There is no mass.     Tenderness: There is no abdominal tenderness.  Musculoskeletal:     Right lower leg: No edema.     Left lower leg: No edema.     Comments: Normal appearance fo left shoulder Positive Hawkin's sign, tenderness with extreme abduction  Neurological:     Mental Status: He is alert and oriented to person, place, and time.  Psychiatric:        Mood and Affect:  Mood normal.        Latest Ref Rng & Units 04/06/2024    8:07 AM 10/07/2023    8:15 AM 07/07/2023    8:42 AM  CMP  Glucose 70 - 99 mg/dL 899  92  85   BUN 6 - 20 mg/dL 22  19  18    Creatinine 0.61 - 1.24 mg/dL 8.71  8.70  8.79   Sodium 135 - 145 mmol/L 138  137  140   Potassium 3.5 - 5.1 mmol/L 4.0  4.2  4.1    Chloride 98 - 111 mmol/L 109  108  112   CO2 22 - 32 mmol/L 24  22  22    Calcium  8.9 - 10.3 mg/dL 8.6  8.7  8.9   Total Protein 6.5 - 8.1 g/dL 7.1  7.2  7.1   Total Bilirubin 0.0 - 1.2 mg/dL 0.4  0.4  0.3   Alkaline Phos 38 - 126 U/L 81  77  70   AST 15 - 41 U/L 13  10  12    ALT 0 - 44 U/L 13  12  13      Lipid Panel     Component Value Date/Time   CHOL 197 10/25/2023 0939   TRIG 182 (H) 10/25/2023 0939   HDL 39 (L) 10/25/2023 0939   CHOLHDL 3.1 04/17/2021 1011   CHOLHDL 3.5 06/29/2019 0005   VLDL 17 06/29/2019 0005   LDLCALC 126 (H) 10/25/2023 0939    CBC    Component Value Date/Time   WBC 6.4 04/06/2024 0807   WBC 12.0 (H) 09/21/2021 1958   RBC 4.73 04/06/2024 0807   RBC 4.72 04/06/2024 0807   HGB 11.9 (L) 04/06/2024 0807   HGB 11.1 (L) 10/29/2022 0853   HCT 36.6 (L) 04/06/2024 0807   HCT 34.6 (L) 10/29/2022 0853   PLT 253 04/06/2024 0807   PLT 278 10/29/2022 0853   MCV 77.5 (L) 04/06/2024 0807   MCV 79 10/29/2022 0853   MCH 25.2 (L) 04/06/2024 0807   MCHC 32.5 04/06/2024 0807   RDW 16.2 (H) 04/06/2024 0807   RDW 15.8 (H) 10/29/2022 0853   LYMPHSABS 2.0 04/06/2024 0807   LYMPHSABS 1.8 10/29/2022 0853   MONOABS 0.7 04/06/2024 0807   EOSABS 0.1 04/06/2024 0807   EOSABS 0.3 10/29/2022 0853   BASOSABS 0.0 04/06/2024 0807   BASOSABS 0.0 10/29/2022 0853    Lab Results  Component Value Date   HGBA1C 5.7 04/24/2024        Assessment & Plan Right shoulder pain after fall Right shoulder pain likely due to muscle bruising and tension from fall. - Recommend Voltaren gel for anti-inflammatory effect. - Advise use of ice packs to reduce soreness. - Educated on expected healing time of one to two weeks.  Chronic pulmonary embolism with pulmonary hypertension Chronic pulmonary embolism with pulmonary hypertension managed with warfarin. No recent follow-up with pulmonologist. Aggressive management needed due to young age and increased pulmonary artery pressure. -  Instruct him to contact Dr. Annella for follow-up and medication management. - Continue warfarin as prescribed which is managed by clinical pharmacist.  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 Recent A1c excellent at 5.7. Current regimen includes maximum dose of Mounjaro . - Continue current diabetes medications. -No microalbuminuria from last set of labs.  If microalbuminuria noticed we will place him on SGLT2i - Schedule follow-up in six months.  Plantar fasciitis Recurrent plantar fasciitis with recent  flare-up. -Seen by podiatrist and has been using foot brace - Recommend visiting the [employer] store for appropriate shoe fitting and support.  Hypertension associated with type II chronic kidney disease due to diabetes - Blood pressure is controlled - Avoid nephrotoxins - Screen for microalbuminuria  Hyperlipidemia associated with type 2 diabetes - He has been ingesting an increased amount of red meat to increase his iron levels - Last LDL was above goal of less than 100 - Will check lipid panel switch to Crestor if indicated   General Health Maintenance Due for HPV vaccine and hepatitis B immunity check. HPV vaccine recommended to prevent certain cancers and can be administered up to age 54. Hepatitis B immunity important due to high exposure risk. - Administer first dose of HPV vaccine today and schedule subsequent doses. - Check hepatitis B antibody levels to assess immunity.    Healthcare maintenance See general health maintenance above  Meds ordered this encounter  Medications   amLODipine  (NORVASC ) 10 MG tablet    Sig: Take 1 tablet (10 mg total) by mouth daily.    Dispense:  90 tablet    Refill:  1   carvedilol  (COREG ) 25 MG tablet    Sig: Take 1 tablet (25 mg total) by mouth 2 (two) times daily.    Dispense:  180 tablet    Refill:  1   tirzepatide  (MOUNJARO ) 15 MG/0.5ML Pen     Sig: Inject 15 mg into the skin once a week.    Dispense:  6 mL    Refill:  6    Discontinue 12.5 mg    Follow-up: Return for nurse visit, 2nd dose HPV, Medical conditions with PCP, in 6 months.       Corrina Sabin, MD, FAAFP. [facility] [place], [place] [phone]   04/24/2024, 9:39 AM

## 2024-04-25 ENCOUNTER — Ambulatory Visit: Payer: Self-pay | Admitting: Family Medicine

## 2024-04-25 LAB — MICROALBUMIN / CREATININE URINE RATIO
Creatinine, Urine: 308.6 mg/dL
Microalb/Creat Ratio: 10 mg/g{creat} (ref 0–29)
Microalbumin, Urine: 31.3 ug/mL

## 2024-04-25 LAB — LP+NON-HDL CHOLESTEROL
Cholesterol, Total: 135 mg/dL (ref 100–199)
HDL: 42 mg/dL (ref >39)
LDL Chol Calc (NIH): 70 mg/dL (ref 0–99)
Total Non-HDL-Chol (LDL+VLDL): 93 mg/dL (ref 0–129)
Triglycerides: 132 mg/dL (ref 0–149)
VLDL Cholesterol Cal: 23 mg/dL (ref 5–40)

## 2024-04-25 LAB — HEPATITIS B SURFACE ANTIBODY, QUANTITATIVE: Hepatitis B Surf Ab Quant: <3.5 m[IU]/mL — ABNORMAL LOW

## 2024-05-03 ENCOUNTER — Telehealth: Payer: Self-pay | Admitting: Family Medicine

## 2024-05-03 ENCOUNTER — Other Ambulatory Visit: Payer: Self-pay | Admitting: Family Medicine

## 2024-05-03 DIAGNOSIS — E1122 Type 2 diabetes mellitus with diabetic chronic kidney disease: Secondary | ICD-10-CM

## 2024-05-03 DIAGNOSIS — Z7984 Long term (current) use of oral hypoglycemic drugs: Secondary | ICD-10-CM

## 2024-05-03 NOTE — Telephone Encounter (Signed)
 Patient confirmed appointment for 05/05/2024, through volunteer call.

## 2024-05-05 ENCOUNTER — Ambulatory Visit: Attending: Family Medicine | Admitting: Pharmacist

## 2024-05-05 DIAGNOSIS — I82512 Chronic embolism and thrombosis of left femoral vein: Secondary | ICD-10-CM

## 2024-05-05 DIAGNOSIS — I2699 Other pulmonary embolism without acute cor pulmonale: Secondary | ICD-10-CM

## 2024-05-05 LAB — POCT INR: POC INR: 2.7

## 2024-05-08 ENCOUNTER — Encounter: Payer: Self-pay | Admitting: Podiatry

## 2024-05-08 ENCOUNTER — Ambulatory Visit (INDEPENDENT_AMBULATORY_CARE_PROVIDER_SITE_OTHER): Admitting: Podiatry

## 2024-05-08 DIAGNOSIS — M722 Plantar fascial fibromatosis: Secondary | ICD-10-CM | POA: Diagnosis not present

## 2024-05-08 NOTE — Progress Notes (Signed)
  Subjective:  Patient ID: William Moss, male    DOB: March 11, 1986,   MRN: [id number]  Chief Complaint  Patient presents with   Plantar Fasciitis    It's doing okay.  It has it's good days and bad days.    38 y.o. male presents for follow-up of right plantar fasciitis. Relates doing better about 65%. States he has his good days and bad days. Has been stretching and wearing brace and injection did help.    Patient is diabetic and last A1c was  Lab Results  Component Value Date   HGBA1C 5.7 04/24/2024   .   PCP:  Newlin, Enobong, MD     . Denies any other pedal complaints. Denies n/v/f/c.   Past Medical History:  Diagnosis Date   Atypical chest pain 05/27/2016   a. 05/2016: NST showing small area of moderare anteroapical ischemia b. 05/2016: cath showing tortous but normal cors which confirmed a false positive NST.   Diabetes mellitus without complication (HCC)    GERD (gastroesophageal reflux disease) 10/02/2016   Hypertension    Pulmonary embolism, bilateral (HCC)     Objective:  Physical Exam: Vascular: DP/PT pulses 2/4 bilateral. CFT <3 seconds. Normal hair growth on digits. No edema.  Skin. No lacerations or abrasions bilateral feet.  Musculoskeletal: MMT 5/5 bilateral lower extremities in DF, PF, Inversion and Eversion. Deceased ROM in DF of ankle joint. Tender to the medial calcaneal tubercle right . More pain to lateral plantar tubercle and along lateral arch and plantar fascia.  No pain with achilles, PT or arch. No pain with calcaneal squeeze.  Neurological: Sensation intact to light touch.   Assessment:   1. Plantar fasciitis, right        Plan:  Patient was evaluated and treated and all questions answered. Discussed plantar fasciitis with patient.  X-rays reviewed and discussed with patient. No acute fractures or dislocations noted. Mild spurring noted at inferior calcaneus.  Discussed treatment options including, ice, NSAIDS, supportive shoes, bracing, and  stretching. Continue brace and stretching.  Continue meloxicam .  Injection deferred  Amb ref to PT Follow-up 9 weeks or sooner if any problems arise. In the meantime, encouraged to call the office with any questions, concerns, change in symptoms.      Asberry Failing, DPM

## 2024-05-24 NOTE — Therapy (Incomplete)
 OUTPATIENT PHYSICAL THERAPY LOWER EXTREMITY EVALUATION   Patient Name: William Moss MRN: [id number] DOB:09-21-1985, 38 y.o., male Today's Date: 05/24/2024  END OF SESSION:   Past Medical History:  Diagnosis Date   Atypical chest pain 05/27/2016   a. 05/2016: NST showing small area of moderare anteroapical ischemia b. 05/2016: cath showing tortous but normal cors which confirmed a false positive NST.   Diabetes mellitus without complication (HCC)    GERD (gastroesophageal reflux disease) 10/02/2016   Hypertension    Pulmonary embolism, bilateral (HCC)    Past Surgical History:  Procedure Laterality Date   CARDIAC CATHETERIZATION N/A 06/10/2016   Procedure: Left Heart Cath and Coronary Angiography;  Surgeon: Deatrice DELENA Cage, MD;  Location: [facility];  Service: Cardiovascular;  Laterality: N/A;   COLONOSCOPY N/A 02/04/2016   Procedure: COLONOSCOPY;  Surgeon: Norleen LOISE Kiang, MD;  Location: [facility];  Service: Endoscopy;  Laterality: N/A;   NO PAST SURGERIES     Patient Active Problem List   Diagnosis Date Noted   Anemia 12/21/2022   Chronic thromboembolic pulmonary hypertension (HCC) 05/18/2022   DVT (deep venous thrombosis) (HCC) 07/08/2021   High risk medication use 07/08/2021   Bilateral pulmonary embolism (HCC) 06/23/2021   Elevated troponin 06/23/2021   Hypomagnesemia 06/23/2021   Leukocytosis 06/23/2021   Pneumonia 06/07/2021   CKD (chronic kidney disease), stage II 06/07/2021   AKI (acute kidney injury) 08/27/2020   Insulin  dependent type 2 diabetes mellitus (HCC) 08/27/2020   Metabolic syndrome 08/27/2020   Diabetes mellitus due to underlying condition, controlled, with complication, without long-term current use of insulin  (HCC)    Pure hypercholesterolemia    Morbid obesity with BMI of 60.0-69.9, adult (HCC)    Diabetes mellitus type 2, uncontrolled, with complications 06/28/2019   Sinus tachycardia 06/27/2019   COVID-19 virus infection 06/27/2019    Sleep apnea 03/27/2019   Normal coronary arteries 03/27/2019   GERD (gastroesophageal reflux disease) 10/02/2016   Coronary artery disease involving native heart    Abnormal nuclear stress test    Atypical chest pain 05/27/2016   Type II diabetes mellitus with stage 2 chronic kidney disease (HCC) 09/30/2015   Hematochezia 09/30/2015   Essential hypertension 07/29/2015   Morbid obesity (HCC) 07/29/2015   PCP: Delbert Clam, MD  REFERRING PROVIDER: Joya Stabs, DPM   REFERRING DIAG: M72.2 (ICD-10-CM) - Plantar fasciitis, right   THERAPY DIAG:  No diagnosis found.  Rationale for Evaluation and Treatment: Rehabilitation  ONSET DATE: ***  SUBJECTIVE:   SUBJECTIVE STATEMENT: ***  PERTINENT HISTORY: ***  PAIN:  Are you having pain?  Yes: NPRS scale: *** Pain location: *** Pain description: *** Aggravating factors: *** Relieving factors: ***  PRECAUTIONS: {Therapy precautions:24002}  RED FLAGS: {PT Red Flags:29287}   WEIGHT BEARING RESTRICTIONS: {Yes ***/No:24003}  FALLS:  Has patient fallen in last 6 months? {fallsyesno:27318}  LIVING ENVIRONMENT: Lives with: {[facility] lives with:25569::lives with their family} Lives in: {Lives in:25570} Stairs: {opstairs:27293} Has following equipment at home: {Assistive devices:23999}  OCCUPATION: ***  PLOF: {PLOF:24004}  PATIENT GOALS: ***  NEXT MD VISIT: ***  OBJECTIVE:  Note: Objective measures were completed at Evaluation unless otherwise noted.  DIAGNOSTIC FINDINGS: ***  PATIENT SURVEYS:  {rehab surveys:24030}  COGNITION: Overall cognitive status: {cognition:24006}     SENSATION: {sensation:27233}  EDEMA:  {edema:24020}  MUSCLE LENGTH: Hamstrings: Right *** deg; Left *** deg Debby test: Right *** deg; Left *** deg  POSTURE: {posture:25561}  PALPATION: ***  LOWER EXTREMITY ROM:  {AROM/PROM:27142} ROM Right eval  Left eval  Hip flexion    Hip extension    Hip abduction    Hip  adduction    Hip internal rotation    Hip external rotation    Knee flexion    Knee extension    Ankle dorsiflexion    Ankle plantarflexion    Ankle inversion    Ankle eversion     (Blank rows = not tested)  LOWER EXTREMITY MMT:  MMT Right eval Left eval  Hip flexion    Hip extension    Hip abduction    Hip adduction    Hip internal rotation    Hip external rotation    Knee flexion    Knee extension    Ankle dorsiflexion    Ankle plantarflexion    Ankle inversion    Ankle eversion     (Blank rows = not tested)  LOWER EXTREMITY SPECIAL TESTS:  {LEspecialtests:26242}  FUNCTIONAL TESTS:  {Functional tests:24029}  GAIT: Distance walked: *** Assistive device utilized: {Assistive devices:23999} Level of assistance: {Levels of assistance:24026} Comments: ***   TREATMENT: [facility] Adult PT Treatment:                                                DATE: *** Therapeutic Exercise: *** Manual Therapy: *** Neuromuscular re-ed: *** Therapeutic Activity: *** Modalities: *** Self Care: ***  PATIENT EDUCATION:  Education details: *** Person educated: {Person educated:25204} Education method: {Education Method:25205} Education comprehension: {Education Comprehension:25206}  HOME EXERCISE PROGRAM: ***  ASSESSMENT:  CLINICAL IMPRESSION: Patient is a *** y.o. *** who was seen today for physical therapy evaluation and treatment for ***.   OBJECTIVE IMPAIRMENTS: {opptimpairments:25111}.   ACTIVITY LIMITATIONS: {activitylimitations:27494}  PARTICIPATION LIMITATIONS: {participationrestrictions:25113}  PERSONAL FACTORS: {Personal factors:25162} are also affecting patient's functional outcome.   REHAB POTENTIAL: {rehabpotential:25112}  CLINICAL DECISION MAKING: {clinical decision making:25114}  EVALUATION COMPLEXITY: {Evaluation complexity:25115}   GOALS: Goals reviewed with patient? No  SHORT TERM GOALS: Target date: 06/14/2024   Pt will be compliant and  knowledgeable with initial HEP for improved comfort and carryover Baseline: initial HEP given  Goal status: INITIAL  2.  Pt will self report *** pain no greater than ***/10 for improved comfort and functional ability Baseline: ***/10 at worst Goal status: {GOALSTATUS:25110}   LONG TERM GOALS: Target date: 07/19/2024   Pt will improve LEFS to no less than ***/80 as proxy for functional improvement with home ADLs and higher level community activity Baseline: ***/80 Goal status: {GOALSTATUS:25110}   2.  Pt will self report *** pain no greater than ***/10 for improved comfort and functional ability Baseline: ***/10 at worst Goal status: {GOALSTATUS:25110}   3.  *** Baseline:  Goal status: INITIAL  4.  *** Baseline:  Goal status: INITIAL  5.  *** Baseline:  Goal status: INITIAL  6.  *** Baseline:  Goal status: INITIAL   PLAN:  PT FREQUENCY: {rehab frequency:25116}  PT DURATION: {rehab duration:25117}  PLANNED INTERVENTIONS: {rehab planned interventions:25118::97110-Therapeutic exercises,97530- Therapeutic [phone]- Neuromuscular re-education,97535- Self Rjmz,02859- Manual therapy,Patient/Family education}  PLAN FOR NEXT SESSION: PIERRETTE Alm JAYSON Johna, PT 05/24/2024, 2:37 PM

## 2024-05-25 ENCOUNTER — Ambulatory Visit

## 2024-06-05 NOTE — Therapy (Incomplete)
 OUTPATIENT PHYSICAL THERAPY LOWER EXTREMITY EVALUATION   Patient Name: William Moss MRN: [id number] DOB:June 09, 1986, 38 y.o., male Today's Date: 06/05/2024  END OF SESSION:   Past Medical History:  Diagnosis Date   Atypical chest pain 05/27/2016   a. 05/2016: NST showing small area of moderare anteroapical ischemia b. 05/2016: cath showing tortous but normal cors which confirmed a false positive NST.   Diabetes mellitus without complication (HCC)    GERD (gastroesophageal reflux disease) 10/02/2016   Hypertension    Pulmonary embolism, bilateral (HCC)    Past Surgical History:  Procedure Laterality Date   CARDIAC CATHETERIZATION N/A 06/10/2016   Procedure: Left Heart Cath and Coronary Angiography;  Surgeon: Deatrice DELENA Cage, MD;  Location: [facility];  Service: Cardiovascular;  Laterality: N/A;   COLONOSCOPY N/A 02/04/2016   Procedure: COLONOSCOPY;  Surgeon: Norleen LOISE Kiang, MD;  Location: [facility];  Service: Endoscopy;  Laterality: N/A;   NO PAST SURGERIES     Patient Active Problem List   Diagnosis Date Noted   Anemia 12/21/2022   Chronic thromboembolic pulmonary hypertension (HCC) 05/18/2022   DVT (deep venous thrombosis) (HCC) 07/08/2021   High risk medication use 07/08/2021   Bilateral pulmonary embolism (HCC) 06/23/2021   Elevated troponin 06/23/2021   Hypomagnesemia 06/23/2021   Leukocytosis 06/23/2021   Pneumonia 06/07/2021   CKD (chronic kidney disease), stage II 06/07/2021   AKI (acute kidney injury) 08/27/2020   Insulin  dependent type 2 diabetes mellitus (HCC) 08/27/2020   Metabolic syndrome 08/27/2020   Diabetes mellitus due to underlying condition, controlled, with complication, without long-term current use of insulin  (HCC)    Pure hypercholesterolemia    Morbid obesity with BMI of 60.0-69.9, adult (HCC)    Diabetes mellitus type 2, uncontrolled, with complications 06/28/2019   Sinus tachycardia 06/27/2019   COVID-19 virus infection 06/27/2019    Sleep apnea 03/27/2019   Normal coronary arteries 03/27/2019   GERD (gastroesophageal reflux disease) 10/02/2016   Coronary artery disease involving native heart    Abnormal nuclear stress test    Atypical chest pain 05/27/2016   Type II diabetes mellitus with stage 2 chronic kidney disease (HCC) 09/30/2015   Hematochezia 09/30/2015   Essential hypertension 07/29/2015   Morbid obesity (HCC) 07/29/2015   PCP: Delbert Clam, MD  REFERRING PROVIDER: Joya Stabs, DPM   REFERRING DIAG: M72.2 (ICD-10-CM) - Plantar fasciitis, right   THERAPY DIAG:  No diagnosis found.  Rationale for Evaluation and Treatment: Rehabilitation  ONSET DATE: ***  SUBJECTIVE:   SUBJECTIVE STATEMENT: ***  PERTINENT HISTORY: ***  PAIN:  Are you having pain?  Yes: NPRS scale: *** Pain location: *** Pain description: *** Aggravating factors: *** Relieving factors: ***  PRECAUTIONS: {Therapy precautions:24002}  RED FLAGS: {PT Red Flags:29287}   WEIGHT BEARING RESTRICTIONS: {Yes ***/No:24003}  FALLS:  Has patient fallen in last 6 months? {fallsyesno:27318}  LIVING ENVIRONMENT: Lives with: {[facility] lives with:25569::lives with their family} Lives in: {Lives in:25570} Stairs: {opstairs:27293} Has following equipment at home: {Assistive devices:23999}  OCCUPATION: ***  PLOF: {PLOF:24004}  PATIENT GOALS: ***  NEXT MD VISIT: ***  OBJECTIVE:  Note: Objective measures were completed at Evaluation unless otherwise noted.  DIAGNOSTIC FINDINGS: ***  PATIENT SURVEYS:  {rehab surveys:24030}  COGNITION: Overall cognitive status: {cognition:24006}     SENSATION: {sensation:27233}  EDEMA:  {edema:24020}  MUSCLE LENGTH: Hamstrings: Right *** deg; Left *** deg Debby test: Right *** deg; Left *** deg  POSTURE: {posture:25561}  PALPATION: ***  LOWER EXTREMITY ROM:  {AROM/PROM:27142} ROM Right eval  Left eval  Hip flexion    Hip extension    Hip abduction    Hip  adduction    Hip internal rotation    Hip external rotation    Knee flexion    Knee extension    Ankle dorsiflexion    Ankle plantarflexion    Ankle inversion    Ankle eversion     (Blank rows = not tested)  LOWER EXTREMITY MMT:  MMT Right eval Left eval  Hip flexion    Hip extension    Hip abduction    Hip adduction    Hip internal rotation    Hip external rotation    Knee flexion    Knee extension    Ankle dorsiflexion    Ankle plantarflexion    Ankle inversion    Ankle eversion     (Blank rows = not tested)  LOWER EXTREMITY SPECIAL TESTS:  {LEspecialtests:26242}  FUNCTIONAL TESTS:  {Functional tests:24029}  GAIT: Distance walked: *** Assistive device utilized: {Assistive devices:23999} Level of assistance: {Levels of assistance:24026} Comments: ***   TREATMENT: [facility] Adult PT Treatment:                                                DATE: *** Therapeutic Exercise: *** Manual Therapy: *** Neuromuscular re-ed: *** Therapeutic Activity: *** Modalities: *** Self Care: ***  PATIENT EDUCATION:  Education details: *** Person educated: {Person educated:25204} Education method: {Education Method:25205} Education comprehension: {Education Comprehension:25206}  HOME EXERCISE PROGRAM: ***  ASSESSMENT:  CLINICAL IMPRESSION: Patient is a *** y.o. *** who was seen today for physical therapy evaluation and treatment for ***.   OBJECTIVE IMPAIRMENTS: {opptimpairments:25111}.   ACTIVITY LIMITATIONS: {activitylimitations:27494}  PARTICIPATION LIMITATIONS: {participationrestrictions:25113}  PERSONAL FACTORS: {Personal factors:25162} are also affecting patient's functional outcome.   REHAB POTENTIAL: {rehabpotential:25112}  CLINICAL DECISION MAKING: {clinical decision making:25114}  EVALUATION COMPLEXITY: {Evaluation complexity:25115}   GOALS: Goals reviewed with patient? No  SHORT TERM GOALS: Target date: 06/14/2024   Pt will be compliant and  knowledgeable with initial HEP for improved comfort and carryover Baseline: initial HEP given  Goal status: INITIAL  2.  Pt will self report *** pain no greater than ***/10 for improved comfort and functional ability Baseline: ***/10 at worst Goal status: {GOALSTATUS:25110}   LONG TERM GOALS: Target date: 07/19/2024   Pt will improve LEFS to no less than ***/80 as proxy for functional improvement with home ADLs and higher level community activity Baseline: ***/80 Goal status: {GOALSTATUS:25110}   2.  Pt will self report *** pain no greater than ***/10 for improved comfort and functional ability Baseline: ***/10 at worst Goal status: {GOALSTATUS:25110}   3.  *** Baseline:  Goal status: INITIAL  4.  *** Baseline:  Goal status: INITIAL  5.  *** Baseline:  Goal status: INITIAL  6.  *** Baseline:  Goal status: INITIAL   PLAN:  PT FREQUENCY: {rehab frequency:25116}  PT DURATION: {rehab duration:25117}  PLANNED INTERVENTIONS: {rehab planned interventions:25118::97110-Therapeutic exercises,97530- Therapeutic [phone]- Neuromuscular re-education,97535- Self Rjmz,02859- Manual therapy,Patient/Family education}  PLAN FOR NEXT SESSION: ***   Alm JAYSON Kingdom, PT 06/05/2024, 2:28 PM

## 2024-06-06 ENCOUNTER — Ambulatory Visit

## 2024-06-15 ENCOUNTER — Encounter: Payer: Self-pay | Admitting: Family Medicine

## 2024-06-16 ENCOUNTER — Ambulatory Visit: Admitting: Pharmacist

## 2024-06-19 ENCOUNTER — Ambulatory Visit: Attending: Family Medicine | Admitting: Pharmacist

## 2024-06-19 DIAGNOSIS — I82512 Chronic embolism and thrombosis of left femoral vein: Secondary | ICD-10-CM | POA: Diagnosis not present

## 2024-06-19 LAB — POCT INR: INR: 2.4 (ref 2.0–3.0)

## 2024-06-23 ENCOUNTER — Other Ambulatory Visit: Payer: Self-pay

## 2024-06-23 ENCOUNTER — Ambulatory Visit: Attending: Podiatry

## 2024-06-23 DIAGNOSIS — R2689 Other abnormalities of gait and mobility: Secondary | ICD-10-CM | POA: Insufficient documentation

## 2024-06-23 DIAGNOSIS — M722 Plantar fascial fibromatosis: Secondary | ICD-10-CM | POA: Diagnosis not present

## 2024-06-23 DIAGNOSIS — M79671 Pain in right foot: Secondary | ICD-10-CM | POA: Diagnosis not present

## 2024-06-23 DIAGNOSIS — M6281 Muscle weakness (generalized): Secondary | ICD-10-CM | POA: Diagnosis not present

## 2024-06-23 NOTE — Therapy (Signed)
 OUTPATIENT PHYSICAL THERAPY LOWER EXTREMITY EVALUATION   Patient Name: William Moss MRN: [id number] DOB:1985-11-27, 38 y.o., male Today's Date: 06/23/2024  END OF SESSION:  PT End of Session - 06/23/24 0950     Visit Number 1    Number of Visits 7    Date for Recertification  08/04/24    Authorization Type [employer]    PT Start Time [phone]   arrived late   PT Stop Time 0933    PT Time Calculation (min) 41 min    Activity Tolerance Patient tolerated treatment well    Behavior During Therapy [facility] for tasks assessed/performed          Past Medical History:  Diagnosis Date   Atypical chest pain 05/27/2016   a. 05/2016: NST showing small area of moderare anteroapical ischemia b. 05/2016: cath showing tortous but normal cors which confirmed a false positive NST.   Diabetes mellitus without complication (HCC)    GERD (gastroesophageal reflux disease) 10/02/2016   Hypertension    Pulmonary embolism, bilateral (HCC)    Past Surgical History:  Procedure Laterality Date   CARDIAC CATHETERIZATION N/A 06/10/2016   Procedure: Left Heart Cath and Coronary Angiography;  Surgeon: Deatrice DELENA Cage, MD;  Location: [facility];  Service: Cardiovascular;  Laterality: N/A;   COLONOSCOPY N/A 02/04/2016   Procedure: COLONOSCOPY;  Surgeon: Norleen LOISE Kiang, MD;  Location: [facility];  Service: Endoscopy;  Laterality: N/A;   NO PAST SURGERIES     Patient Active Problem List   Diagnosis Date Noted   Anemia 12/21/2022   Chronic thromboembolic pulmonary hypertension (HCC) 05/18/2022   DVT (deep venous thrombosis) (HCC) 07/08/2021   High risk medication use 07/08/2021   Bilateral pulmonary embolism (HCC) 06/23/2021   Elevated troponin 06/23/2021   Hypomagnesemia 06/23/2021   Leukocytosis 06/23/2021   Pneumonia 06/07/2021   CKD (chronic kidney disease), stage II 06/07/2021   AKI (acute kidney injury) 08/27/2020   Insulin  dependent type 2 diabetes mellitus (HCC) 08/27/2020   Metabolic syndrome  08/27/2020   Diabetes mellitus due to underlying condition, controlled, with complication, without long-term current use of insulin  (HCC)    Pure hypercholesterolemia    Morbid obesity with BMI of 60.0-69.9, adult (HCC)    Diabetes mellitus type 2, uncontrolled, with complications 06/28/2019   Sinus tachycardia 06/27/2019   COVID-19 virus infection 06/27/2019   Sleep apnea 03/27/2019   Normal coronary arteries 03/27/2019   GERD (gastroesophageal reflux disease) 10/02/2016   Coronary artery disease involving native heart    Abnormal nuclear stress test    Atypical chest pain 05/27/2016   Type II diabetes mellitus with stage 2 chronic kidney disease (HCC) 09/30/2015   Hematochezia 09/30/2015   Essential hypertension 07/29/2015   Morbid obesity (HCC) 07/29/2015   PCP: Delbert Clam, MD  REFERRING PROVIDER: Joya Stabs, DPM   REFERRING DIAG: M72.2 (ICD-10-CM) - Plantar fasciitis, right   THERAPY DIAG:  Pain in right foot  Muscle weakness (generalized)  Other abnormalities of gait and mobility  Rationale for Evaluation and Treatment: Rehabilitation  ONSET DATE: Chronic  SUBJECTIVE:   SUBJECTIVE STATEMENT: Pt presents to PT with reports of acute on chronic R foot pain secondary to plantar fascitis. Notes he had recent injection and this helped relieve pain. Brace on R foot has also been helping. Recently sprained L ankle and his L foot is starting to bother him. First step in morning is becoming more comfortable.  PERTINENT HISTORY: HTN, DM II  PAIN:  Are you having  pain?  Yes: NPRS scale: 0/10 Worst: 4/10 Pain location: R heel, R plantar fascia Pain description: sharp, sore Aggravating factors: prolonged standing, stairs Relieving factors: brace, ice  PRECAUTIONS: None  RED FLAGS: None   WEIGHT BEARING RESTRICTIONS: No  FALLS:  Has patient fallen in last 6 months? Yes. Number of falls - 2 both mechanical while slipping and stumbling while getting away from  bee  LIVING ENVIRONMENT: Lives with: lives with their family Lives in: House/apartment Stairs: No Has following equipment at home: None  OCCUPATION: [employer]  PLOF: Independent  PATIENT GOALS: decrease pain in R foot, improve comfort with standing and walking   NEXT MD VISIT: 07/10/2024  OBJECTIVE:  Note: Objective measures were completed at Evaluation unless otherwise noted.  DIAGNOSTIC FINDINGS: N/A  PATIENT SURVEYS:  LEFS  Extreme difficulty/unable (0), Quite a bit of difficulty (1), Moderate difficulty (2), Little difficulty (3), No difficulty (4) Survey date:  06/23/24  Any of your usual work, housework or school activities 4  2. Usual hobbies, recreational or sporting activities 3  3. Getting into/out of the bath 4  4. Walking between rooms 4  5. Putting on socks/shoes 3  6. Squatting  3  7. Lifting an object, like a bag of groceries from the floor 4  8. Performing light activities around your home 4  9. Performing heavy activities around your home 3  10. Getting into/out of a car 4  11. Walking 2 blocks 4  12. Walking 1 mile 3  13. Going up/down 10 stairs (1 flight) 3  14. Standing for 1 hour 3  15.  sitting for 1 hour 4  16. Running on even ground 3  17. Running on uneven ground 3  18. Making sharp turns while running fast 4  19. Hopping  3  20. Rolling over in bed 3  Score total:  69/80     COGNITION: Overall cognitive status: Within functional limits for tasks assessed     SENSATION: WFL  POSTURE: increased foot pronation, large body habitus  PALPATION: TTP to R heel, medial ankle  LOWER EXTREMITY ROM:  Active ROM Right eval Left eval  Ankle dorsiflexion 6 14  Ankle plantarflexion    Ankle inversion    Ankle eversion     (Blank rows = not tested)  LOWER EXTREMITY MMT:  MMT Right eval Left eval  Hip flexion    Hip extension    Hip abduction    Hip adduction    Hip internal rotation    Hip external  rotation    Knee flexion    Knee extension    Ankle dorsiflexion    Ankle plantarflexion    Ankle inversion 4 5  Ankle eversion 4 5   (Blank rows = not tested)  LOWER EXTREMITY SPECIAL TESTS:  DNT  FUNCTIONAL TESTS:  DNT  GAIT: Distance walked: 75ft Assistive device utilized: None Level of assistance: Complete Independence Comments: decreased R heel strike   TREATMENT: [facility] Adult PT Treatment:                                                DATE: 06/23/2024 Therapeutic Exercise: Long sitting calf strech with strap x 30 R Long sititng ankle eversion x 5 GTB Fig 4 inv x 5 GTB Towel scrunch x 60 Standing gastroc stretch x 30  R Standing soleus stretch x 30 R Seated heel raise with ball x 10  PATIENT EDUCATION:  Education details: eval findings, LEFS, HEP, POC Person educated: Patient Education method: Explanation, Demonstration, and Handouts Education comprehension: verbalized understanding and returned demonstration  HOME EXERCISE PROGRAM: Access Code: [email or web address] URL: [email or web address] Date: 06/23/2024 Prepared by: Alm Kingdom  Exercises - Long Sitting Calf Stretch with Strap  - 1 x daily - 7 x weekly - 2-3 reps - 30 sec hold - Long Sitting Ankle Eversion with Resistance  - 1 x daily - 7 x weekly - 3 sets - 10 reps - green band hold - Seated Figure 4 Ankle Inversion with Resistance  - 1 x daily - 7 x weekly - 3 sets - 10 reps - green band hold - Towel Scrunches  - 1 x daily - 7 x weekly - 2-3 reps - 60 sec hold - Gastroc Stretch on Wall  - 1 x daily - 7 x weekly - 2-3 reps - 30 sec hold - Soleus Stretch on Wall  - 1 x daily - 7 x weekly - 2-3 reps - 30 sec hold - Seated Calf Raise With Small Ball at Heels  - 1 x daily - 7 x weekly - 3 sets - 15 reps  ASSESSMENT:  CLINICAL IMPRESSION: Patient is a 38 y.o. M who was seen today for physical therapy evaluation and treatment for chronic R foot pain. Physical findings are consistent with referring  provider impression as pt demonstrates decrease in R ankle DF, functional mobility, and ankle/foot intrinsic strength. LEFS score shows minimal disability in performance of home ADLs and higher level community activities. Pt would benefit from skilled PT services working on improving R calf length and foot intrinsic strength in order to decrease pain and improve comfort.   OBJECTIVE IMPAIRMENTS: Abnormal gait, decreased activity tolerance, decreased balance, decreased mobility, difficulty walking, decreased ROM, decreased strength, and pain  ACTIVITY LIMITATIONS: standing, squatting, stairs, transfers, and locomotion level  PARTICIPATION LIMITATIONS: shopping, community activity, occupation, and yard work  PERSONAL FACTORS: Time since onset of injury/illness/exacerbation and 1-2 comorbidities: HTN ,DM II are also affecting patient's functional outcome.   REHAB POTENTIAL: Excellent  CLINICAL DECISION MAKING: Stable/uncomplicated  EVALUATION COMPLEXITY: Low   GOALS: Goals reviewed with patient? No  SHORT TERM GOALS: Target date: 06/14/2024   Pt will be compliant and knowledgeable with initial HEP for improved comfort and carryover Baseline: initial HEP given  Goal status: INITIAL  2.  Pt will self report right foot pain no greater than 2/10 for improved comfort and functional ability Baseline: 4/10 at worst Goal status: INITIAL   LONG TERM GOALS: Target date: 07/19/2024   Pt will improve LEFS to no less than 78/80 as proxy for functional improvement with home ADLs and higher level community activity Baseline: 69/80 Goal status: INITIAL   2.  Pt will self report right foot pain no greater than 1/10 for improved comfort and functional ability Baseline: 4/10 at worst Goal status: INITIAL   3.  Pt will improve R ankle DF to no less than 12 degrees for improved comfort and mobility Baseline: 6 degrees Goal status: INITIAL  4.  Pt will improve R ankle inv/ev MMT to at least 5/5  for improved function and decreased pain Baseline: 4/5 Goal status: INITIAL   PLAN:  PT FREQUENCY: 1x/week  PT DURATION: 8 weeks  PLANNED INTERVENTIONS: [id number]- PT Re-evaluation, 97110-Therapeutic exercises, 97530- Therapeutic activity, [id number]- Neuromuscular re-education, 97535- Self Care, 02859- Manual therapy, 02883-  Gait training, [phone]- Electrical stimulation (unattended), [id number]- Electrical stimulation (manual), 02983- Vasopneumatic device, [phone] (1-2 muscles), 20561 (3+ muscles)- Dry Needling, Patient/Family education, Cryotherapy, and Moist heat  PLAN FOR NEXT SESSION: assess HEP response, calf stretching, foot intrinsic strengthening    Alm JAYSON Kingdom, PT 06/23/2024, 9:50 AM

## 2024-06-26 ENCOUNTER — Ambulatory Visit: Attending: Family Medicine

## 2024-06-26 ENCOUNTER — Encounter: Payer: Self-pay | Admitting: Family Medicine

## 2024-07-03 ENCOUNTER — Ambulatory Visit: Attending: Family Medicine

## 2024-07-03 DIAGNOSIS — Z23 Encounter for immunization: Secondary | ICD-10-CM | POA: Diagnosis not present

## 2024-07-03 NOTE — Progress Notes (Signed)
 HPV and Flu vaccine administered per protocols.  Information sheet given. Patient denies and pain or discomfort at injection site. Tolerated injection well no reaction.

## 2024-07-05 ENCOUNTER — Ambulatory Visit: Attending: Podiatry

## 2024-07-05 DIAGNOSIS — M6281 Muscle weakness (generalized): Secondary | ICD-10-CM | POA: Insufficient documentation

## 2024-07-05 DIAGNOSIS — M79671 Pain in right foot: Secondary | ICD-10-CM | POA: Insufficient documentation

## 2024-07-05 NOTE — Therapy (Signed)
 OUTPATIENT PHYSICAL THERAPY LOWER EXTREMITY EVALUATION   Patient Name: William Moss MRN: [id number] DOB:1985-09-15, 38 y.o., male Today's Date: 07/05/2024  END OF SESSION:  PT End of Session - 07/05/24 0811     Visit Number 2    Number of Visits 7    Date for Recertification  08/04/24    Authorization Type [employer]    PT Start Time [phone]   pt arrived late   PT Stop Time 0845    PT Time Calculation (min) 35 min    Activity Tolerance Patient tolerated treatment well    Behavior During Therapy [facility] for tasks assessed/performed           Past Medical History:  Diagnosis Date   Atypical chest pain 05/27/2016   a. 05/2016: NST showing small area of moderare anteroapical ischemia b. 05/2016: cath showing tortous but normal cors which confirmed a false positive NST.   Diabetes mellitus without complication (HCC)    GERD (gastroesophageal reflux disease) 10/02/2016   Hypertension    Pulmonary embolism, bilateral (HCC)    Past Surgical History:  Procedure Laterality Date   CARDIAC CATHETERIZATION N/A 06/10/2016   Procedure: Left Heart Cath and Coronary Angiography;  Surgeon: Deatrice DELENA Cage, MD;  Location: [facility];  Service: Cardiovascular;  Laterality: N/A;   COLONOSCOPY N/A 02/04/2016   Procedure: COLONOSCOPY;  Surgeon: Norleen LOISE Kiang, MD;  Location: [facility] ENDOS[facility];  Service: Endoscopy;  Laterality: N/A;   NO PAST SURGERIES     Patient Active Problem List   Diagnosis Date Noted   Anemia 12/21/2022   Chronic thromboembolic pulmonary hypertension (HCC) 05/18/2022   DVT (deep venous thrombosis) (HCC) 07/08/2021   High risk medication use 07/08/2021   Bilateral pulmonary embolism (HCC) 06/23/2021   Elevated troponin 06/23/2021   Hypomagnesemia 06/23/2021   Leukocytosis 06/23/2021   Pneumonia 06/07/2021   CKD (chronic kidney disease), stage II 06/07/2021   AKI (acute kidney injury) 08/27/2020   Insulin  dependent type 2 diabetes mellitus (HCC) 08/27/2020   Metabolic  syndrome 08/27/2020   Diabetes mellitus due to underlying condition, controlled, with complication, without long-term current use of insulin  (HCC)    Pure hypercholesterolemia    Morbid obesity with BMI of 60.0-69.9, adult (HCC)    Diabetes mellitus type 2, uncontrolled, with complications 06/28/2019   Sinus tachycardia 06/27/2019   COVID-19 virus infection 06/27/2019   Sleep apnea 03/27/2019   Normal coronary arteries 03/27/2019   GERD (gastroesophageal reflux disease) 10/02/2016   Coronary artery disease involving native heart    Abnormal nuclear stress test    Atypical chest pain 05/27/2016   Type II diabetes mellitus with stage 2 chronic kidney disease (HCC) 09/30/2015   Hematochezia 09/30/2015   Essential hypertension 07/29/2015   Morbid obesity (HCC) 07/29/2015   PCP: Delbert Clam, MD  REFERRING PROVIDER: Joya Stabs, DPM   REFERRING DIAG: M72.2 (ICD-10-CM) - Plantar fasciitis, right   THERAPY DIAG:  Pain in right foot  Muscle weakness (generalized)  Rationale for Evaluation and Treatment: Rehabilitation  ONSET DATE: Chronic  SUBJECTIVE:   SUBJECTIVE STATEMENT: Reports doing HEP consistently and pain not too bad today.    Pt presents to PT with reports of acute on chronic R foot pain secondary to plantar fascitis. Notes he had recent injection and this helped relieve pain. Brace on R foot has also been helping. Recently sprained L ankle and his L foot is starting to bother him. First step in morning is becoming more comfortable.  PERTINENT HISTORY: HTN,  DM II  PAIN:   1.5/10 pain Are you having pain?  Yes: NPRS scale: 0/10 Worst: 4/10 Pain location: R heel, R plantar fascia Pain description: sharp, sore Aggravating factors: prolonged standing, stairs Relieving factors: brace, ice  PRECAUTIONS: None  RED FLAGS: None   WEIGHT BEARING RESTRICTIONS: No  FALLS:  Has patient fallen in last 6 months? Yes. Number of falls - 2 both mechanical while  slipping and stumbling while getting away from bee  LIVING ENVIRONMENT: Lives with: lives with their family Lives in: House/apartment Stairs: No Has following equipment at home: None  OCCUPATION: [employer]  PLOF: Independent  PATIENT GOALS: decrease pain in R foot, improve comfort with standing and walking   NEXT MD VISIT: 07/10/2024  OBJECTIVE:  Note: Objective measures were completed at Evaluation unless otherwise noted.  DIAGNOSTIC FINDINGS: N/A  PATIENT SURVEYS:  LEFS  Extreme difficulty/unable (0), Quite a bit of difficulty (1), Moderate difficulty (2), Little difficulty (3), No difficulty (4) Survey date:  06/23/24  Any of your usual work, housework or school activities 4  2. Usual hobbies, recreational or sporting activities 3  3. Getting into/out of the bath 4  4. Walking between rooms 4  5. Putting on socks/shoes 3  6. Squatting  3  7. Lifting an object, like a bag of groceries from the floor 4  8. Performing light activities around your home 4  9. Performing heavy activities around your home 3  10. Getting into/out of a car 4  11. Walking 2 blocks 4  12. Walking 1 mile 3  13. Going up/down 10 stairs (1 flight) 3  14. Standing for 1 hour 3  15.  sitting for 1 hour 4  16. Running on even ground 3  17. Running on uneven ground 3  18. Making sharp turns while running fast 4  19. Hopping  3  20. Rolling over in bed 3  Score total:  69/80     COGNITION: Overall cognitive status: Within functional limits for tasks assessed     SENSATION: WFL  POSTURE: increased foot pronation, large body habitus  PALPATION: TTP to R heel, medial ankle  LOWER EXTREMITY ROM:  Active ROM Right eval Left eval  Ankle dorsiflexion 6 14  Ankle plantarflexion    Ankle inversion    Ankle eversion     (Blank rows = not tested)  LOWER EXTREMITY MMT:  MMT Right eval Left eval  Hip flexion    Hip extension    Hip abduction    Hip  adduction    Hip internal rotation    Hip external rotation    Knee flexion    Knee extension    Ankle dorsiflexion    Ankle plantarflexion    Ankle inversion 4 5  Ankle eversion 4 5   (Blank rows = not tested)  LOWER EXTREMITY SPECIAL TESTS:  DNT  FUNCTIONAL TESTS:  DNT  GAIT: Distance walked: 60ft Assistive device utilized: None Level of assistance: Complete Independence Comments: decreased R heel strike   TREATMENT: Treatment 11/5 Therapeutic Exercise  Towel scrunches 2x10x3s Seated toe extension 2x10s RTB circles BL 2x8 CW/CCW RTB PF BL 2x8x3s  Therapeutic Activity HEP reassessment and update    [facility] Adult PT Treatment:  DATE: 06/23/2024 Therapeutic Exercise: Long sitting calf strech with strap x 30 R Long sititng ankle eversion x 5 GTB Fig 4 inv x 5 GTB Towel scrunch x 60 Standing gastroc stretch x 30 R Standing soleus stretch x 30 R Seated heel raise with ball x 10  PATIENT EDUCATION:  Education details: eval findings, LEFS, HEP, POC Person educated: Patient Education method: Explanation, Demonstration, and Handouts Education comprehension: verbalized understanding and returned demonstration  HOME EXERCISE PROGRAM: Access Code: [email or web address] URL: [email or web address] Date: 06/23/2024 Prepared by: Alm Kingdom  Exercises - Long Sitting Calf Stretch with Strap  - 1 x daily - 7 x weekly - 2-3 reps - 30 sec hold - Long Sitting Ankle Eversion with Resistance  - 1 x daily - 7 x weekly - 3 sets - 10 reps - green band hold - Seated Figure 4 Ankle Inversion with Resistance  - 1 x daily - 7 x weekly - 3 sets - 10 reps - green band hold - Towel Scrunches  - 1 x daily - 7 x weekly - 2-3 reps - 60 sec hold - Gastroc Stretch on Wall  - 1 x daily - 7 x weekly - 2-3 reps - 30 sec hold - Soleus Stretch on Wall  - 1 x daily - 7 x weekly - 2-3 reps - 30 sec hold - Seated Calf Raise With Small Ball at  Heels  - 1 x daily - 7 x weekly - 3 sets - 15 reps  ASSESSMENT:  CLINICAL IMPRESSION: Patient tolerated treatment with no increases in pain with progressions in BL calf loading, mobility, and control. Current deficits include: excessive pain, foot control. As a result, patient would continue to benefit from skilled PT to address said deficits via plan below.    Patient is a 38 y.o. M who was seen today for physical therapy evaluation and treatment for chronic R foot pain. Physical findings are consistent with referring provider impression as pt demonstrates decrease in R ankle DF, functional mobility, and ankle/foot intrinsic strength. LEFS score shows minimal disability in performance of home ADLs and higher level community activities. Pt would benefit from skilled PT services working on improving R calf length and foot intrinsic strength in order to decrease pain and improve comfort.   OBJECTIVE IMPAIRMENTS: Abnormal gait, decreased activity tolerance, decreased balance, decreased mobility, difficulty walking, decreased ROM, decreased strength, and pain  ACTIVITY LIMITATIONS: standing, squatting, stairs, transfers, and locomotion level  PARTICIPATION LIMITATIONS: shopping, community activity, occupation, and yard work  PERSONAL FACTORS: Time since onset of injury/illness/exacerbation and 1-2 comorbidities: HTN ,DM II are also affecting patient's functional outcome.   REHAB POTENTIAL: Excellent  CLINICAL DECISION MAKING: Stable/uncomplicated  EVALUATION COMPLEXITY: Low   GOALS: Goals reviewed with patient? No  SHORT TERM GOALS: Target date: 06/14/2024   Pt will be compliant and knowledgeable with initial HEP for improved comfort and carryover Baseline: initial HEP given  Goal status: INITIAL  2.  Pt will self report right foot pain no greater than 2/10 for improved comfort and functional ability Baseline: 4/10 at worst Goal status: INITIAL   LONG TERM GOALS: Target date:  07/19/2024   Pt will improve LEFS to no less than 78/80 as proxy for functional improvement with home ADLs and higher level community activity Baseline: 69/80 Goal status: INITIAL   2.  Pt will self report right foot pain no greater than 1/10 for improved comfort and functional ability Baseline: 4/10 at worst Goal status: INITIAL   3.  Pt  will improve R ankle DF to no less than 12 degrees for improved comfort and mobility Baseline: 6 degrees Goal status: INITIAL  4.  Pt will improve R ankle inv/ev MMT to at least 5/5 for improved function and decreased pain Baseline: 4/5 Goal status: INITIAL   PLAN:  PT FREQUENCY: 1x/week  PT DURATION: 8 weeks  PLANNED INTERVENTIONS: 97164- PT Re-evaluation, 97110-Therapeutic exercises, 97530- Therapeutic activity, 97112- Neuromuscular re-education, 97535- Self Care, [id number]- Manual therapy, [id number]- Gait training, [phone]- Electrical stimulation (unattended), [id number]- Electrical stimulation (manual), 97016- Vasopneumatic device, 20560 (1-2 muscles), 20561 (3+ muscles)- Dry Needling, Patient/Family education, Cryotherapy, and Moist heat  PLAN FOR NEXT SESSION: assess HEP response, calf stretching, foot intrinsic strengthening    [place] Odessia Scot, PT 07/05/2024, 8:43 AM

## 2024-07-10 ENCOUNTER — Ambulatory Visit: Admitting: Podiatry

## 2024-07-10 ENCOUNTER — Encounter: Payer: Self-pay | Admitting: Podiatry

## 2024-07-10 DIAGNOSIS — M722 Plantar fascial fibromatosis: Secondary | ICD-10-CM | POA: Diagnosis not present

## 2024-07-10 MED ORDER — TRIAMCINOLONE ACETONIDE 10 MG/ML IJ SUSP
2.5000 mg | Freq: Once | INTRAMUSCULAR | Status: AC
  Administered 2024-07-10: 2.5 mg via INTRA_ARTICULAR

## 2024-07-10 MED ORDER — DEXAMETHASONE SODIUM PHOSPHATE 120 MG/30ML IJ SOLN
4.0000 mg | Freq: Once | INTRAMUSCULAR | Status: AC
  Administered 2024-07-10: 4 mg via INTRA_ARTICULAR

## 2024-07-10 NOTE — Progress Notes (Signed)
  Subjective:  Patient ID: William Moss, male    DOB: 1986/01/29,   MRN: [id number]  Chief Complaint  Patient presents with   Plantar Fasciitis    It's doing alright.    38 y.o. male presents for follow-up of right plantar fasciitis.  Relates doing well.  Pain has gotten a little bit better.  Continues to be off-and-on.  He has been in physical therapy and this seems to be helping.  He would like to speed up the process though.  Patient is diabetic and last A1c was  Lab Results  Component Value Date   HGBA1C 5.7 04/24/2024   .   PCP:  Newlin, Enobong, MD     . Denies any other pedal complaints. Denies n/v/f/c.   Past Medical History:  Diagnosis Date   Atypical chest pain 05/27/2016   a. 05/2016: NST showing small area of moderare anteroapical ischemia b. 05/2016: cath showing tortous but normal cors which confirmed a false positive NST.   Diabetes mellitus without complication (HCC)    GERD (gastroesophageal reflux disease) 10/02/2016   Hypertension    Pulmonary embolism, bilateral (HCC)     Objective:  Physical Exam: Vascular: DP/PT pulses 2/4 bilateral. CFT <3 seconds. Normal hair growth on digits. No edema.  Skin. No lacerations or abrasions bilateral feet.  Musculoskeletal: MMT 5/5 bilateral lower extremities in DF, PF, Inversion and Eversion. Deceased ROM in DF of ankle joint. Tender to the medial calcaneal tubercle right . More pain to lateral plantar tubercle and along lateral arch and plantar fascia.  No pain with achilles, PT or arch. No pain with calcaneal squeeze.  Neurological: Sensation intact to light touch.   Assessment:   1. Plantar fasciitis, right         Plan:  Patient was evaluated and treated and all questions answered. Discussed plantar fasciitis with patient.  X-rays reviewed and discussed with patient. No acute fractures or dislocations noted. Mild spurring noted at inferior calcaneus.  Discussed treatment options including, ice, NSAIDS,  supportive shoes, bracing, and stretching.  Continue PT and anti-inflammatories as needed. Injection provided procedure below. Follow-up as needed  Procedure: Injection Tendon/Ligament Discussed alternatives, risks, complications and verbal consent was obtained.  Location: Right plantar fascia. Skin Prep: Alcohol. Injectate: 1cc 0.5% marcaine plain, 1 cc dexamethasone  0.5 cc kenalog   Disposition: Patient tolerated procedure well. Injection site dressed with a band-aid.  Post-injection care was discussed and return precautions discussed.       Asberry Failing, DPM

## 2024-07-12 ENCOUNTER — Ambulatory Visit

## 2024-07-18 ENCOUNTER — Encounter: Payer: Self-pay | Admitting: Family Medicine

## 2024-07-18 ENCOUNTER — Ambulatory Visit: Attending: Family Medicine | Admitting: Family Medicine

## 2024-07-18 VITALS — BP 130/84 | HR 87 | Temp 98.9°F | Ht 68.0 in | Wt 363.2 lb

## 2024-07-18 DIAGNOSIS — E1149 Type 2 diabetes mellitus with other diabetic neurological complication: Secondary | ICD-10-CM | POA: Diagnosis not present

## 2024-07-18 DIAGNOSIS — Z7984 Long term (current) use of oral hypoglycemic drugs: Secondary | ICD-10-CM

## 2024-07-18 DIAGNOSIS — Z6841 Body Mass Index (BMI) 40.0 and over, adult: Secondary | ICD-10-CM | POA: Diagnosis not present

## 2024-07-18 DIAGNOSIS — E538 Deficiency of other specified B group vitamins: Secondary | ICD-10-CM

## 2024-07-18 DIAGNOSIS — Z7902 Long term (current) use of antithrombotics/antiplatelets: Secondary | ICD-10-CM

## 2024-07-18 DIAGNOSIS — Z7901 Long term (current) use of anticoagulants: Secondary | ICD-10-CM

## 2024-07-18 DIAGNOSIS — Z79899 Other long term (current) drug therapy: Secondary | ICD-10-CM

## 2024-07-18 MED ORDER — GABAPENTIN 300 MG PO CAPS: 300.0000 mg | ORAL_CAPSULE | Freq: Every day | ORAL | 1 refills | Status: AC

## 2024-07-18 MED ORDER — B-12 1000 MCG PO CAPS: ORAL_CAPSULE | ORAL | 1 refills | Status: AC

## 2024-07-18 NOTE — Patient Instructions (Signed)
 VISIT SUMMARY:  You came in today for a follow-up regarding tingling in your left foot and to check your INR levels. We discussed your diabetes management, vitamin B12 deficiency, and hypertension.  YOUR PLAN:  -TYPE 2 DIABETES MELLITUS WITH DIABETIC NEUROPATHY AND VITAMIN B12 DEFICIENCY: Diabetic neuropathy is nerve damage caused by high blood sugar levels, and vitamin B12 deficiency can also cause nerve problems. You have been experiencing tingling and numbness in your left foot. We have started you on gabapentin at night to help with the neuropathy, and you can increase it to twice daily if needed. We also recommend you start taking over-the-counter vitamin B12 supplements. We will recheck your vitamin B12 levels in February.  -TYPE 2 DIABETES MELLITUS WITH DIABETIC CHRONIC KIDNEY DISEASE: Chronic kidney disease is a long-term condition where the kidneys do not work as well as they should. Your diabetes management is ongoing with your current medications, and there are no new issues related to your kidney disease.  -HYPERTENSION: Hypertension, or high blood pressure, is when the force of the blood against your artery walls is too high. Your blood pressure is well-controlled with your current medications, so you should continue taking them as prescribed.  INSTRUCTIONS:  Please follow up for your next INR check on November 21st. We will recheck your vitamin B12 levels in February.

## 2024-07-18 NOTE — Progress Notes (Signed)
 Subjective:  Patient ID: William Moss, male    DOB: [birth year]  Age: 38 y.o. MRN: [id number]  CC: Medical Management of Chronic Issues     Discussed the use of AI scribe software for clinical note transcription with the patient, who gave verbal consent to proceed.  History of Present Illness William Moss is a 38 year old male with a history of  hypertension, type 2 diabetes mellitus , morbid obesity, GERD, bilateral pulmonary embolism in 05/2021 s/p TPA, chronic thromboembolic pulmonary hypertension, chronic anemia  who presents for a follow-up regarding tingling in his left foot and INR check.  Tingling and numbness are present around the outer edge of the left foot, particularly noticeable at night. Different sleeping positions have been attempted without relief. There is no hypersensitivity when sheets touch the foot.  Diabetes is managed with metformin . A confirmed vitamin B12 deficiency from August remains untreated due to cost issues with obtaining vitamin B12 supplementation which his insurance does not cover.  He is on warfarin with a recent INR of 2.4 on October 20th. A follow-up INR check is scheduled for November 21st.    Past Medical History:  Diagnosis Date   Atypical chest pain 05/27/2016   a. 05/2016: NST showing small area of moderare anteroapical ischemia b. 05/2016: cath showing tortous but normal cors which confirmed a false positive NST.   Diabetes mellitus without complication (HCC)    GERD (gastroesophageal reflux disease) 10/02/2016   Hypertension    Pulmonary embolism, bilateral (HCC)     Past Surgical History:  Procedure Laterality Date   CARDIAC CATHETERIZATION N/A 06/10/2016   Procedure: Left Heart Cath and Coronary Angiography;  Surgeon: Deatrice DELENA Cage, MD;  Location: [facility];  Service: Cardiovascular;  Laterality: N/A;   COLONOSCOPY N/A 02/04/2016   Procedure: COLONOSCOPY;  Surgeon: Norleen LOISE Kiang, MD;  Location: [facility];  Service:  Endoscopy;  Laterality: N/A;   NO PAST SURGERIES      Family History  Problem Relation Age of Onset   Diabetes Mother    Diabetes Father    Pulmonary embolism Father    Cancer Maternal Grandmother    Alzheimer's disease Paternal Grandmother    Breast cancer Cousin    Colon cancer Neg Hx    Esophageal cancer Neg Hx    Stomach cancer Neg Hx     Social History   Socioeconomic History   Marital status: Single    Spouse name: Not on file   Number of children: 0   Years of education: Not on file   Highest education level: Some college, no degree  Occupational History   Occupation: call center rep   Occupation: call center  Tobacco Use   Smoking status: Never   Smokeless tobacco: Never  Vaping Use   Vaping status: Not on file  Substance and Sexual Activity   Alcohol use: No   Drug use: No   Sexual activity: Not Currently  Other Topics Concern   Not on file  Social History Narrative   Not on file   Social Drivers of Health   Financial Resource Strain: Medium Risk (07/14/2024)   Overall Financial Resource Strain (CARDIA)    Difficulty of Paying Living Expenses: Somewhat hard  Food Insecurity: Food Insecurity Present (07/14/2024)   Hunger Vital Sign    Worried About Running Out of Food in the Last Year: Never true    Ran Out of Food in the Last Year: Sometimes true  Transportation Needs:  No Transportation Needs (07/14/2024)   PRAPARE - Administrator, Civil Service (Medical): No    Lack of Transportation (Non-Medical): No  Physical Activity: Insufficiently Active (07/14/2024)   Exercise Vital Sign    Days of Exercise per Week: 2 days    Minutes of Exercise per Session: 30 min  Stress: Stress Concern Present (07/14/2024)   [employer] of Occupational Health - Occupational Stress Questionnaire    Feeling of Stress: Rather much  Social Connections: Moderately Isolated (07/14/2024)   Social Connection and Isolation Panel    Frequency of Communication  with Friends and Family: More than three times a week    Frequency of Social Gatherings with Friends and Family: Once a week    Attends Religious Services: More than 4 times per year    Active Member of [employer] or Organizations: No    Attends Engineer, Structural: Not on file    Marital Status: Never married    Allergies  Allergen Reactions   Lisinopril -Hydrochlorothiazide  Other (See Comments)    Interfered with the pH level in the serum  Acidosis type reaction    Outpatient Medications Prior to Visit  Medication Sig Dispense Refill   acetaminophen  (TYLENOL ) 500 MG tablet Take 1 tablet (500 mg total) by mouth every 6 (six) hours as needed. 30 tablet 0   amLODipine  (NORVASC ) 10 MG tablet Take 1 tablet (10 mg total) by mouth daily. 90 tablet 1   atorvastatin  (LIPITOR) 80 MG tablet TAKE 1 TABLET BY MOUTH EVERY DAY 90 tablet 1   Blood Glucose Monitoring Suppl (ONETOUCH VERIO REFLECT) w/Device KIT Check blood sugar TID E11.69 1 kit 0   carvedilol  (COREG ) 25 MG tablet Take 1 tablet (25 mg total) by mouth 2 (two) times daily. 180 tablet 1   hydrALAZINE  (APRESOLINE ) 50 MG tablet TAKE 1 TABLET BY MOUTH EVERY 8 HOURS. 270 tablet 1   hydrocortisone  (ANUSOL -HC) 25 MG suppository Place 1 suppository (25 mg total) rectally at bedtime. 30 suppository 3   Insulin  Pen Needle (BD PEN NEEDLE NANO U/F) 32G X 4 MM MISC 10 Units by Does not apply route daily. 100 each 3   Lancets (ONETOUCH DELICA PLUS LANCET33G) MISC CHECK BLOOD SUGAR 3 TIMES A DAY 100 each 2   metFORMIN  (GLUCOPHAGE ) 500 MG tablet TAKE 1 TABLET BY MOUTH 2 TIMES DAILY WITH A MEAL. 180 tablet 1   tirzepatide  (MOUNJARO ) 15 MG/0.5ML Pen Inject 15 mg into the skin once a week. 6 mL 6   warfarin (COUMADIN ) 10 MG tablet TAKE 1/2 TABLET ON MONDAY, WEDNESDAY, AND FRIDAY. TAKE 1 TABLET ALL OTHER DAYS. 72 tablet 2   amoxicillin -clavulanate (AUGMENTIN ) 875-125 MG tablet Take 1 tablet by mouth every 12 (twelve) hours. (Patient not taking:  Reported on 07/18/2024) 14 tablet 0   ferrous sulfate  325 (65 FE) MG EC tablet Take 1 tablet (325 mg total) by mouth daily. Take with a source of Vitamin C (Patient not taking: Reported on 07/18/2024) 90 tablet 1   folic acid  (FOLVITE ) 1 MG tablet Take 1 tablet (1 mg total) by mouth daily. (Patient not taking: Reported on 07/18/2024) 90 tablet 1   meloxicam  (MOBIC ) 15 MG tablet TAKE 1 TABLET (15 MG TOTAL) BY MOUTH DAILY. (Patient not taking: Reported on 07/18/2024) 30 tablet 0   ondansetron  (ZOFRAN ) 4 MG tablet Take 1 tablet (4 mg total) by mouth every 8 (eight) hours as needed for nausea or vomiting. (Patient not taking: Reported on 07/18/2024) 4 tablet 0  Riociguat  (ADEMPAS ) 1 MG TABS Rx # 1: Take 1 tab by mouth three times daily x 2 weeks. If systolic BP > 95, then increase to 1.5 tabs three times daily x 2 weeks (Patient not taking: Reported on 07/18/2024) 105 tablet 0   Riociguat  (ADEMPAS ) 2 MG TABS Rx # 2: Take 1 tab by mouth three times daily x 2 weeks (Patient not taking: Reported on 07/18/2024) 42 tablet 0   Cyanocobalamin  (B-12) 1000 MCG CAPS Take 1 tablet daily (Patient not taking: Reported on 07/18/2024) 90 capsule 1   Facility-Administered Medications Prior to Visit  Medication Dose Route Frequency Provider Last Rate Last Admin   dexamethasone  (DECADRON ) injection 4 mg  4 mg Intra-articular Once Sikora, Rebecca, DPM         ROS Review of Systems  Constitutional:  Negative for activity change and appetite change.  HENT:  Negative for sinus pressure and sore throat.   Respiratory:  Negative for chest tightness, shortness of breath and wheezing.   Cardiovascular:  Negative for chest pain and palpitations.  Gastrointestinal:  Negative for abdominal distention, abdominal pain and constipation.  Genitourinary: Negative.   Musculoskeletal: Negative.   Neurological:  Positive for numbness.  Psychiatric/Behavioral:  Negative for behavioral problems and dysphoric mood.     Objective:   BP 130/84   Pulse 87   Temp 98.9 F (37.2 C) (Oral)   Ht 5' 8 (1.727 m)   Wt (!) 363 lb 3.2 oz (164.7 kg)   SpO2 95%   BMI 55.22 kg/m      07/18/2024    8:58 AM 04/24/2024    9:01 AM 04/06/2024    8:51 AM  BP/Weight  Systolic BP 130 138 185  Diastolic BP 84 86 105  Wt. (Lbs) 363.2 357.8 354.2  BMI 55.22 kg/m2 54.4 kg/m2 53.86 kg/m2      Physical Exam Constitutional:      Appearance: He is well-developed.  Cardiovascular:     Rate and Rhythm: Normal rate.     Heart sounds: Normal heart sounds. No murmur heard. Pulmonary:     Effort: Pulmonary effort is normal.     Breath sounds: Normal breath sounds. No wheezing or rales.  Chest:     Chest wall: No tenderness.  Abdominal:     General: Bowel sounds are normal. There is no distension.     Palpations: Abdomen is soft. There is no mass.     Tenderness: There is no abdominal tenderness.  Musculoskeletal:        General: Normal range of motion.     Right lower leg: No edema.     Left lower leg: No edema.  Neurological:     Mental Status: He is alert and oriented to person, place, and time.  Psychiatric:        Mood and Affect: Mood normal.        Latest Ref Rng & Units 04/06/2024    8:07 AM 10/07/2023    8:15 AM 07/07/2023    8:42 AM  CMP  Glucose 70 - 99 mg/dL 899  92  85   BUN 6 - 20 mg/dL 22  19  18    Creatinine 0.61 - 1.24 mg/dL 8.71  8.70  8.79   Sodium 135 - 145 mmol/L 138  137  140   Potassium 3.5 - 5.1 mmol/L 4.0  4.2  4.1   Chloride 98 - 111 mmol/L 109  108  112   CO2 22 - 32 mmol/L 24  22  22   Calcium  8.9 - 10.3 mg/dL 8.6  8.7  8.9   Total Protein 6.5 - 8.1 g/dL 7.1  7.2  7.1   Total Bilirubin 0.0 - 1.2 mg/dL 0.4  0.4  0.3   Alkaline Phos 38 - 126 U/L 81  77  70   AST 15 - 41 U/L 13  10  12    ALT 0 - 44 U/L 13  12  13      Lipid Panel     Component Value Date/Time   CHOL 135 04/24/2024 1003   TRIG 132 04/24/2024 1003   HDL 42 04/24/2024 1003   CHOLHDL 3.1 04/17/2021 1011   CHOLHDL 3.5  06/29/2019 0005   VLDL 17 06/29/2019 0005   LDLCALC 70 04/24/2024 1003    CBC    Component Value Date/Time   WBC 6.4 04/06/2024 0807   WBC 12.0 (H) 09/21/2021 1958   RBC 4.73 04/06/2024 0807   RBC 4.72 04/06/2024 0807   HGB 11.9 (L) 04/06/2024 0807   HGB 11.1 (L) 10/29/2022 0853   HCT 36.6 (L) 04/06/2024 0807   HCT 34.6 (L) 10/29/2022 0853   PLT 253 04/06/2024 0807   PLT 278 10/29/2022 0853   MCV 77.5 (L) 04/06/2024 0807   MCV 79 10/29/2022 0853   MCH 25.2 (L) 04/06/2024 0807   MCHC 32.5 04/06/2024 0807   RDW 16.2 (H) 04/06/2024 0807   RDW 15.8 (H) 10/29/2022 0853   LYMPHSABS 2.0 04/06/2024 0807   LYMPHSABS 1.8 10/29/2022 0853   MONOABS 0.7 04/06/2024 0807   EOSABS 0.1 04/06/2024 0807   EOSABS 0.3 10/29/2022 0853   BASOSABS 0.0 04/06/2024 0807   BASOSABS 0.0 10/29/2022 0853    Lab Results  Component Value Date   HGBA1C 5.7 04/24/2024       Assessment & Plan Type 2 diabetes mellitus with diabetic neuropathy and vitamin B12 deficiency Recent tingling and numbness in left foot due to diabetic neuropathy and vitamin B12 deficiency. Symptoms mild and intermittent. Vitamin B12 deficiency confirmed in August, supplementation delayed due to cost. - Initiated gabapentin at night for neuropathy, may increase to twice daily if symptoms persist.  Vitamin B12 deficiency Vitamin B12 level was 136 in 03/2024 indicating deficiency - Recommended over-the-counter vitamin B12 supplementation. - Recheck vitamin B12 levels in February.         Meds ordered this encounter  Medications   gabapentin (NEURONTIN) 300 MG capsule    Sig: Take 1 capsule (300 mg total) by mouth at bedtime.    Dispense:  90 capsule    Refill:  1   Cyanocobalamin  (B-12) 1000 MCG CAPS    Sig: Take 1 tablet daily    Dispense:  90 capsule    Refill:  1    Follow-up: Return for previously scheduled appointment and 3rd dose HPV at next visit.       Corrina Sabin, MD, FAAFP. [facility], [place] [phone]   07/18/2024, 9:31 AM

## 2024-07-19 ENCOUNTER — Ambulatory Visit

## 2024-07-19 DIAGNOSIS — M79671 Pain in right foot: Secondary | ICD-10-CM | POA: Diagnosis not present

## 2024-07-19 DIAGNOSIS — M6281 Muscle weakness (generalized): Secondary | ICD-10-CM

## 2024-07-19 NOTE — Therapy (Addendum)
 " OUTPATIENT PHYSICAL THERAPY LOWER EXTREMITY EVALUATION PHYSICAL THERAPY UNPLANNED DISCHARGE SUMMARY   Visits from Start of Care: 3  Current functional level related to goals / functional outcomes: Current status unknown   Remaining deficits: Current status unknown   Education / Equipment: Pt has not returned since visit listed below  Patient goals were not assessed. Patient is being discharged due to not returning since the last visit.  (the note below was addended to include the above D/C summary on 09/27/24)   Patient Name: JAMORI BIGGAR MRN: [id number] DOB:1985-10-27, 38 y.o., male Today's Date: 07/19/2024  END OF SESSION:  PT End of Session - 07/19/24 0849     Visit Number 3    Number of Visits 7    Date for Recertification  08/04/24    Authorization Type BCBS    PT Start Time [phone]    PT Stop Time 0930    PT Time Calculation (min) 40 min    Activity Tolerance Patient tolerated treatment well    Behavior During Therapy [facility] for tasks assessed/performed            Past Medical History:  Diagnosis Date   Atypical chest pain 05/27/2016   a. 05/2016: NST showing small area of moderare anteroapical ischemia b. 05/2016: cath showing tortous but normal cors which confirmed a false positive NST.   Diabetes mellitus without complication (HCC)    GERD (gastroesophageal reflux disease) 10/02/2016   Hypertension    Pulmonary embolism, bilateral (HCC)    Past Surgical History:  Procedure Laterality Date   CARDIAC CATHETERIZATION N/A 06/10/2016   Procedure: Left Heart Cath and Coronary Angiography;  Surgeon: Deatrice DELENA Cage, MD;  Location: [facility];  Service: Cardiovascular;  Laterality: N/A;   COLONOSCOPY N/A 02/04/2016   Procedure: COLONOSCOPY;  Surgeon: Norleen LOISE Kiang, MD;  Location: [facility];  Service: Endoscopy;  Laterality: N/A;   NO PAST SURGERIES     Patient Active Problem List   Diagnosis Date Noted   Anemia 12/21/2022   Chronic thromboembolic  pulmonary hypertension (HCC) 05/18/2022   DVT (deep venous thrombosis) (HCC) 07/08/2021   High risk medication use 07/08/2021   Bilateral pulmonary embolism (HCC) 06/23/2021   Elevated troponin 06/23/2021   Hypomagnesemia 06/23/2021   Leukocytosis 06/23/2021   Pneumonia 06/07/2021   CKD (chronic kidney disease), stage II 06/07/2021   AKI (acute kidney injury) 08/27/2020   Insulin  dependent type 2 diabetes mellitus (HCC) 08/27/2020   Metabolic syndrome 08/27/2020   Diabetes mellitus due to underlying condition, controlled, with complication, without long-term current use of insulin  (HCC)    Pure hypercholesterolemia    Morbid obesity with BMI of 60.0-69.9, adult (HCC)    Diabetes mellitus type 2, uncontrolled, with complications 06/28/2019   Sinus tachycardia 06/27/2019   COVID-19 virus infection 06/27/2019   Sleep apnea 03/27/2019   Normal coronary arteries 03/27/2019   GERD (gastroesophageal reflux disease) 10/02/2016   Coronary artery disease involving native heart    Abnormal nuclear stress test    Atypical chest pain 05/27/2016   Type II diabetes mellitus with stage 2 chronic kidney disease (HCC) 09/30/2015   Hematochezia 09/30/2015   Essential hypertension 07/29/2015   Morbid obesity (HCC) 07/29/2015   PCP: Delbert Clam, MD  REFERRING PROVIDER: Joya Stabs, DPM   REFERRING DIAG: M72.2 (ICD-10-CM) - Plantar fasciitis, right   THERAPY DIAG:  Pain in right foot  Muscle weakness (generalized)  Rationale for Evaluation and Treatment: Rehabilitation  ONSET DATE: Chronic  SUBJECTIVE:  SUBJECTIVE STATEMENT: Reports doing HEP consistently. Reports doing better in terms of improving pain free ROM and overall pain levels.   Pt presents to PT with reports of acute on chronic R foot pain secondary to plantar fascitis. Notes he had recent injection and this helped relieve pain. Brace on R foot has also been helping. Recently sprained L ankle and his L foot is starting  to bother him. First step in morning is becoming more comfortable.  PERTINENT HISTORY: HTN, DM II  PAIN:   3/10 pain Are you having pain?  Yes: NPRS scale: 0/10 Worst: 4/10 Pain location: R heel, R plantar fascia Pain description: sharp, sore Aggravating factors: prolonged standing, stairs Relieving factors: brace, ice  PRECAUTIONS: None  RED FLAGS: None   WEIGHT BEARING RESTRICTIONS: No  FALLS:  Has patient fallen in last 6 months? Yes. 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  DIAGNOSTIC FINDINGS: N/A  PATIENT SURVEYS:  LEFS  Extreme difficulty/unable (0), Quite a bit of difficulty (1), Moderate difficulty (2), Little difficulty (3), No difficulty (4) Survey date:  06/23/24  Any of your usual work, housework or school activities 4  2. Usual hobbies, recreational or sporting activities 3  3. Getting into/out of the bath 4  4. Walking between rooms 4  5. Putting on socks/shoes 3  6. Squatting  3  7. Lifting an object, like a bag of groceries from the floor 4  8. Performing light activities around your home 4  9. Performing heavy activities around your home 3  10. Getting into/out of a car 4  11. Walking 2 blocks 4  12. Walking 1 mile 3  13. Going up/down 10 stairs (1 flight) 3  14. Standing for 1 hour 3  15.  sitting for 1 hour 4  16. Running on even ground 3  17. Running on uneven ground 3  18. Making sharp turns while running fast 4  19. Hopping  3  20. Rolling over in bed 3  Score total:  69/80      COGNITION: Overall cognitive status: Within functional limits for tasks assessed     SENSATION: WFL  POSTURE: increased foot pronation, large body habitus  PALPATION: TTP to R heel, medial ankle  LOWER EXTREMITY ROM:  Active ROM Right eval Left eval  Ankle dorsiflexion 6 14  Ankle plantarflexion    Ankle inversion    Ankle eversion     (Blank rows = not tested)  LOWER EXTREMITY MMT:  MMT Right eval Left eval  Hip flexion    Hip extension    Hip abduction    Hip adduction    Hip internal rotation    Hip external rotation    Knee flexion    Knee extension    Ankle dorsiflexion    Ankle plantarflexion    Ankle inversion 4 5  Ankle eversion 4 5   (Blank rows = not tested)  LOWER EXTREMITY SPECIAL TESTS:  DNT  FUNCTIONAL TESTS:  DNT  GAIT: Distance walked: 45ft Assistive device utilized: None Level of assistance: Complete Independence Comments:  decreased R heel strike   TREATMENT:   Treatment 11/19 Therapeutic Exercise GTB eversions 2x8x3s GTB inversions 2x8x3s Towel scrunches 2x10x3s Seated toe extension 2x10s GTB circles 2x8 CW/CCW   Therapeutic Activity HEP reassessment and update Seated calf raises x8x3s Standing calf raises x6x3s     Treatment 11/5 Therapeutic Exercise  Towel scrunches 2x10x3s Seated toe extension 2x10s RTB circles BL 2x8 CW/CCW RTB PF BL 2x8x3s  Therapeutic Activity HEP reassessment and update    [facility] Adult PT Treatment:                                                DATE: 06/23/2024 Therapeutic Exercise: Long sitting calf strech with strap x 30 R Long sititng ankle eversion x 5 GTB Fig 4 inv x 5 GTB Towel scrunch x 60 Standing gastroc stretch x 30 R Standing soleus stretch x 30 R Seated heel raise with ball x 10  PATIENT EDUCATION:  Education details: eval findings, LEFS, HEP, POC Person educated: Patient Education method: Explanation, Demonstration, and Handouts Education comprehension:  verbalized understanding and returned demonstration  HOME EXERCISE PROGRAM: Access Code: [email or web address] URL: [email or web address] Date: 06/23/2024 Prepared by: Alm Kingdom  Exercises - Long Sitting Calf Stretch with Strap  - 1 x daily - 7 x weekly - 2-3 reps - 30 sec hold - Long Sitting Ankle Eversion with Resistance  - 1 x daily - 7 x weekly - 3 sets - 10 reps - green band hold - Seated Figure 4 Ankle Inversion with Resistance  - 1 x daily - 7 x weekly - 3 sets - 10 reps - green band hold - Towel Scrunches  - 1 x daily - 7 x weekly - 2-3 reps - 60 sec hold - Gastroc Stretch on Wall  - 1 x daily - 7 x weekly - 2-3 reps - 30 sec hold - Soleus Stretch on Wall  - 1 x daily - 7 x weekly - 2-3 reps - 30 sec hold - Standing Calf Raise With Small Ball at Heels  - 1 x daily - 7 x weekly - 2 sets of 4-6x3s  ASSESSMENT:  CLINICAL IMPRESSION: Patient tolerated treatment with no increases in pain with progressions in BL calf loading, mobility, and control. Current deficits include: excessive pain, foot control. As a result, patient would continue to benefit from skilled PT to address said deficits via plan below.    Patient is a 38 y.o. M who was seen today for physical therapy evaluation and treatment for chronic R foot pain. Physical findings are consistent with referring provider impression as pt demonstrates decrease in R ankle DF, functional mobility, and ankle/foot intrinsic strength. LEFS score shows minimal disability in performance of home ADLs and higher level community activities. Pt would benefit from skilled PT services working on improving R calf length and foot intrinsic strength in order to decrease pain and improve comfort.   OBJECTIVE IMPAIRMENTS: Abnormal gait, decreased activity tolerance, decreased balance, decreased mobility, difficulty walking, decreased ROM, decreased strength, and pain  ACTIVITY LIMITATIONS: standing, squatting, stairs, transfers, and locomotion  level  PARTICIPATION LIMITATIONS: shopping, community activity, occupation, and yard work  PERSONAL FACTORS: Time since onset of injury/illness/exacerbation and 1-2 comorbidities: HTN ,DM II are also affecting patient's functional outcome.   REHAB POTENTIAL: Excellent  CLINICAL DECISION MAKING: Stable/uncomplicated  EVALUATION COMPLEXITY: Low   GOALS: Goals reviewed  with patient? No  SHORT TERM GOALS: Target date: 06/14/2024   Pt will be compliant and knowledgeable with initial HEP for improved comfort and carryover Baseline: initial HEP given  Goal status: INITIAL  2.  Pt will self report right foot pain no greater than 2/10 for improved comfort and functional ability Baseline: 4/10 at worst Goal status: INITIAL   LONG TERM GOALS: Target date: 07/19/2024   Pt will improve LEFS to no less than 78/80 as proxy for functional improvement with home ADLs and higher level community activity Baseline: 69/80 Goal status: INITIAL   2.  Pt will self report right foot pain no greater than 1/10 for improved comfort and functional ability Baseline: 4/10 at worst Goal status: INITIAL   3.  Pt will improve R ankle DF to no less than 12 degrees for improved comfort and mobility Baseline: 6 degrees Goal status: INITIAL  4.  Pt will improve R ankle inv/ev MMT to at least 5/5 for improved function and decreased pain Baseline: 4/5 Goal status: INITIAL   PLAN:  PT FREQUENCY: 1x/week  PT DURATION: 8 weeks  PLANNED INTERVENTIONS: 97164- PT Re-evaluation, 97110-Therapeutic exercises, 97530- Therapeutic activity, 97112- Neuromuscular re-education, 97535- Self Care, 02859- Manual therapy, [id number]- Gait training, [phone]- Electrical stimulation (unattended), [id number]- Electrical stimulation (manual), 97016- Vasopneumatic device, 20560 (1-2 muscles), 20561 (3+ muscles)- Dry Needling, Patient/Family education, Cryotherapy, and Moist heat  PLAN FOR NEXT SESSION: assess HEP response, calf stretching,  foot intrinsic strengthening    [place] Odessia Scot, PT 07/19/2024, 9:26 AM  "

## 2024-07-20 ENCOUNTER — Telehealth: Payer: Self-pay | Admitting: Family Medicine

## 2024-07-20 NOTE — Telephone Encounter (Signed)
 Confirmed appt for 11/20.

## 2024-07-21 ENCOUNTER — Ambulatory Visit: Attending: Family Medicine | Admitting: Pharmacist

## 2024-07-21 DIAGNOSIS — I82512 Chronic embolism and thrombosis of left femoral vein: Secondary | ICD-10-CM

## 2024-07-21 LAB — POCT INR: POC INR: 2.4

## 2024-07-28 ENCOUNTER — Encounter: Payer: Self-pay | Admitting: Family Medicine

## 2024-07-30 ENCOUNTER — Other Ambulatory Visit: Payer: Self-pay | Admitting: Pharmacist

## 2024-07-30 DIAGNOSIS — Z7985 Long-term (current) use of injectable non-insulin antidiabetic drugs: Secondary | ICD-10-CM

## 2024-07-30 MED ORDER — TIRZEPATIDE 15 MG/0.5ML ~~LOC~~ SOAJ: 15.0000 mg | SUBCUTANEOUS | 1 refills | Status: DC

## 2024-07-31 ENCOUNTER — Other Ambulatory Visit: Payer: Self-pay

## 2024-07-31 DIAGNOSIS — Z7985 Long-term (current) use of injectable non-insulin antidiabetic drugs: Secondary | ICD-10-CM

## 2024-07-31 MED ORDER — TIRZEPATIDE 15 MG/0.5ML ~~LOC~~ SOAJ
15.0000 mg | SUBCUTANEOUS | 1 refills | Status: AC
  Filled 2024-07-31 – 2024-08-01 (×2): qty 2, 28d supply, fill #0
  Filled 2024-08-27: qty 2, 28d supply, fill #1
  Filled 2024-09-24: qty 2, 28d supply, fill #2

## 2024-08-01 ENCOUNTER — Other Ambulatory Visit: Payer: Self-pay

## 2024-08-02 ENCOUNTER — Ambulatory Visit

## 2024-08-02 ENCOUNTER — Other Ambulatory Visit: Payer: Self-pay

## 2024-08-15 NOTE — Therapy (Incomplete)
 OUTPATIENT PHYSICAL THERAPY LOWER EXTREMITY EVALUATION   Patient Name: William Moss MRN: [id number] DOB:Feb 05, 1986, 38 y.o., male Today's Date: 08/15/2024  END OF SESSION:      Past Medical History:  Diagnosis Date   Atypical chest pain 05/27/2016   a. 05/2016: NST showing small area of moderare anteroapical ischemia b. 05/2016: cath showing tortous but normal cors which confirmed a false positive NST.   Diabetes mellitus without complication (HCC)    GERD (gastroesophageal reflux disease) 10/02/2016   Hypertension    Pulmonary embolism, bilateral (HCC)    Past Surgical History:  Procedure Laterality Date   CARDIAC CATHETERIZATION N/A 06/10/2016   Procedure: Left Heart Cath and Coronary Angiography;  Surgeon: Deatrice DELENA Cage, MD;  Location: [facility];  Service: Cardiovascular;  Laterality: N/A;   COLONOSCOPY N/A 02/04/2016   Procedure: COLONOSCOPY;  Surgeon: Norleen LOISE Kiang, MD;  Location: [facility];  Service: Endoscopy;  Laterality: N/A;   NO PAST SURGERIES     Patient Active Problem List   Diagnosis Date Noted   Anemia 12/21/2022   Chronic thromboembolic pulmonary hypertension (HCC) 05/18/2022   DVT (deep venous thrombosis) (HCC) 07/08/2021   High risk medication use 07/08/2021   Bilateral pulmonary embolism (HCC) 06/23/2021   Elevated troponin 06/23/2021   Hypomagnesemia 06/23/2021   Leukocytosis 06/23/2021   Pneumonia 06/07/2021   CKD (chronic kidney disease), stage II 06/07/2021   AKI (acute kidney injury) 08/27/2020   Insulin  dependent type 2 diabetes mellitus (HCC) 08/27/2020   Metabolic syndrome 08/27/2020   Diabetes mellitus due to underlying condition, controlled, with complication, without long-term current use of insulin  (HCC)    Pure hypercholesterolemia    Morbid obesity with BMI of 60.0-69.9, adult (HCC)    Diabetes mellitus type 2, uncontrolled, with complications 06/28/2019   Sinus tachycardia 06/27/2019   COVID-19 virus infection  06/27/2019   Sleep apnea 03/27/2019   Normal coronary arteries 03/27/2019   GERD (gastroesophageal reflux disease) 10/02/2016   Coronary artery disease involving native heart    Abnormal nuclear stress test    Atypical chest pain 05/27/2016   Type II diabetes mellitus with stage 2 chronic kidney disease (HCC) 09/30/2015   Hematochezia 09/30/2015   Essential hypertension 07/29/2015   Morbid obesity (HCC) 07/29/2015   PCP: Delbert Clam, MD  REFERRING PROVIDER: Joya Stabs, DPM   REFERRING DIAG: M72.2 (ICD-10-CM) - Plantar fasciitis, right   THERAPY DIAG:  No diagnosis found.  Rationale for Evaluation and Treatment: Rehabilitation  ONSET DATE: Chronic  SUBJECTIVE:   SUBJECTIVE STATEMENT: Reports doing HEP consistently. Reports doing better in terms of improving pain free ROM and overall pain levels.   Pt presents to PT with reports of acute on chronic R foot pain secondary to plantar fascitis. Notes he had recent injection and this helped relieve pain. Brace on R foot has also been helping. Recently sprained L ankle and his L foot is starting to bother him. First step in morning is becoming more comfortable.  PERTINENT HISTORY: HTN, DM II  PAIN:   3/10 pain Are you having pain?  Yes: NPRS scale: 0/10 Worst: 4/10 Pain location: R heel, R plantar fascia Pain description: sharp, sore Aggravating factors: prolonged standing, stairs Relieving factors: brace, ice  PRECAUTIONS: None  RED FLAGS: None   WEIGHT BEARING RESTRICTIONS: No  FALLS:  Has patient fallen in last 6 months? Yes. 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  DIAGNOSTIC FINDINGS: N/A  PATIENT SURVEYS:  LEFS  Extreme difficulty/unable (0), Quite a bit of difficulty (1), Moderate difficulty (2), Little difficulty (3), No difficulty (4) Survey date:  06/23/24  Any of your usual work, housework or school activities 4  2. Usual hobbies, recreational or sporting activities 3  3. Getting into/out of the bath 4  4. Walking between rooms 4  5. Putting on socks/shoes 3  6. Squatting  3  7. Lifting an object, like a bag of groceries from the floor 4  8. Performing light activities around your home 4  9. Performing heavy activities around your home 3  10. Getting into/out of a car 4  11. Walking 2 blocks 4  12. Walking 1 mile 3  13. Going up/down 10 stairs (1 flight) 3  14. Standing for 1 hour 3  15.  sitting for 1 hour 4  16. Running on even ground 3  17. Running on uneven ground 3  18. Making sharp turns while running fast 4  19. Hopping  3  20. Rolling over in bed 3  Score total:  69/80     COGNITION: Overall cognitive status: Within functional limits for tasks assessed     SENSATION: WFL  POSTURE: increased foot pronation, large body habitus  PALPATION: TTP to R heel, medial ankle  LOWER EXTREMITY ROM:  Active ROM Right eval Left eval  Ankle dorsiflexion 6 14  Ankle plantarflexion    Ankle inversion    Ankle eversion     (Blank rows = not tested)  LOWER EXTREMITY MMT:  MMT Right eval Left eval  Hip flexion    Hip extension    Hip abduction    Hip adduction    Hip internal rotation    Hip external rotation    Knee flexion    Knee extension    Ankle dorsiflexion    Ankle plantarflexion    Ankle inversion 4 5  Ankle eversion 4 5   (Blank rows = not tested)  LOWER EXTREMITY SPECIAL TESTS:  DNT  FUNCTIONAL TESTS:  DNT  GAIT: Distance walked: 77ft Assistive device utilized:  None Level of assistance: Complete Independence Comments: decreased R heel strike   TREATMENT:   Treatment 11/19 Therapeutic Exercise GTB eversions 2x8x3s GTB inversions 2x8x3s Towel scrunches 2x10x3s Seated toe extension 2x10s GTB circles 2x8 CW/CCW   Therapeutic Activity HEP reassessment and update Seated calf raises x8x3s Standing calf raises x6x3s     Treatment 11/5 Therapeutic Exercise  Towel scrunches 2x10x3s Seated toe extension 2x10s RTB circles BL 2x8 CW/CCW RTB PF BL 2x8x3s  Therapeutic Activity HEP reassessment and update    [facility] Adult PT Treatment:                                                DATE: 06/23/2024 Therapeutic Exercise: Long sitting calf strech with strap x 30 R Long sititng ankle eversion x 5 GTB Fig 4 inv x 5 GTB Towel scrunch x 60 Standing gastroc stretch x 30  R Standing soleus stretch x 30 R Seated heel raise with ball x 10  PATIENT EDUCATION:  Education details: eval findings, LEFS, HEP, POC Person educated: Patient Education method: Explanation, Demonstration, and Handouts Education comprehension: verbalized understanding and returned demonstration  HOME EXERCISE PROGRAM: Access Code: [email or web address] URL: [email or web address] Date: 06/23/2024 Prepared by: Alm Kingdom  Exercises - Long Sitting Calf Stretch with Strap  - 1 x daily - 7 x weekly - 2-3 reps - 30 sec hold - Long Sitting Ankle Eversion with Resistance  - 1 x daily - 7 x weekly - 3 sets - 10 reps - green band hold - Seated Figure 4 Ankle Inversion with Resistance  - 1 x daily - 7 x weekly - 3 sets - 10 reps - green band hold - Towel Scrunches  - 1 x daily - 7 x weekly - 2-3 reps - 60 sec hold - Gastroc Stretch on Wall  - 1 x daily - 7 x weekly - 2-3 reps - 30 sec hold - Soleus Stretch on Wall  - 1 x daily - 7 x weekly - 2-3 reps - 30 sec hold - Standing Calf Raise With Small Ball at Heels  - 1 x daily - 7 x weekly - 2 sets of  4-6x3s  ASSESSMENT:  CLINICAL IMPRESSION: Patient tolerated treatment with no increases in pain with progressions in BL calf loading, mobility, and control. Current deficits include: excessive pain, foot control. As a result, patient would continue to benefit from skilled PT to address said deficits via plan below.    Patient is a 38 y.o. M who was seen today for physical therapy evaluation and treatment for chronic R foot pain. Physical findings are consistent with referring provider impression as pt demonstrates decrease in R ankle DF, functional mobility, and ankle/foot intrinsic strength. LEFS score shows minimal disability in performance of home ADLs and higher level community activities. Pt would benefit from skilled PT services working on improving R calf length and foot intrinsic strength in order to decrease pain and improve comfort.   OBJECTIVE IMPAIRMENTS: Abnormal gait, decreased activity tolerance, decreased balance, decreased mobility, difficulty walking, decreased ROM, decreased strength, and pain  ACTIVITY LIMITATIONS: standing, squatting, stairs, transfers, and locomotion level  PARTICIPATION LIMITATIONS: shopping, community activity, occupation, and yard work  PERSONAL FACTORS: Time since onset of injury/illness/exacerbation and 1-2 comorbidities: HTN ,DM II are also affecting patient's functional outcome.   REHAB POTENTIAL: Excellent  CLINICAL DECISION MAKING: Stable/uncomplicated  EVALUATION COMPLEXITY: Low   GOALS: Goals reviewed with patient? No  SHORT TERM GOALS: Target date: 06/14/2024   Pt will be compliant and knowledgeable with initial HEP for improved comfort and carryover Baseline: initial HEP given  Goal status: INITIAL  2.  Pt will self report right foot pain no greater than 2/10 for improved comfort and functional ability Baseline: 4/10 at worst Goal status: INITIAL   LONG TERM GOALS: Target date: 07/19/2024   Pt will improve LEFS to no less  than 78/80 as proxy for functional improvement with home ADLs and higher level community activity Baseline: 69/80 Goal status: INITIAL   2.  Pt will self report right foot pain no greater than 1/10 for improved comfort and functional ability Baseline: 4/10 at worst Goal status: INITIAL   3.  Pt will improve R ankle DF to no less than 12 degrees for improved comfort and mobility Baseline: 6 degrees Goal status: INITIAL  4.  Pt will improve R ankle inv/ev MMT to at least 5/5  for improved function and decreased pain Baseline: 4/5 Goal status: INITIAL   PLAN:  PT FREQUENCY: 1x/week  PT DURATION: 8 weeks  PLANNED INTERVENTIONS: 97164- PT Re-evaluation, 97110-Therapeutic exercises, 97530- Therapeutic activity, [id number]- Neuromuscular re-education, 97535- Self Care, [id number]- Manual therapy, [id number]- Gait training, [phone]- Electrical stimulation (unattended), [phone]- Electrical stimulation (manual), 97016- Vasopneumatic device, 20560 (1-2 muscles), 20561 (3+ muscles)- Dry Needling, Patient/Family education, Cryotherapy, and Moist heat  PLAN FOR NEXT SESSION: assess HEP response, calf stretching, foot intrinsic strengthening    [place] Odessia Scot, PT 08/15/2024, 7:08 PM

## 2024-08-16 ENCOUNTER — Ambulatory Visit

## 2024-08-28 ENCOUNTER — Ambulatory Visit: Attending: Family Medicine | Admitting: Pharmacist

## 2024-08-28 DIAGNOSIS — I82512 Chronic embolism and thrombosis of left femoral vein: Secondary | ICD-10-CM

## 2024-08-28 LAB — POCT INR: POC INR: 2.6

## 2024-09-28 ENCOUNTER — Ambulatory Visit: Payer: Self-pay | Attending: Family Medicine | Admitting: Pharmacist

## 2024-09-28 ENCOUNTER — Other Ambulatory Visit: Payer: Self-pay

## 2024-09-28 DIAGNOSIS — I82512 Chronic embolism and thrombosis of left femoral vein: Secondary | ICD-10-CM

## 2024-09-28 LAB — POCT INR: POC INR: 3.5

## 2024-09-30 ENCOUNTER — Other Ambulatory Visit: Payer: Self-pay | Admitting: Family Medicine

## 2024-09-30 DIAGNOSIS — E1122 Type 2 diabetes mellitus with diabetic chronic kidney disease: Secondary | ICD-10-CM

## 2024-10-04 ENCOUNTER — Ambulatory Visit: Payer: Self-pay | Admitting: Urgent Care

## 2024-10-04 ENCOUNTER — Ambulatory Visit
Admission: EM | Admit: 2024-10-04 | Discharge: 2024-10-04 | Disposition: A | Discharge status: Home / Self Care | Source: Home / Self Care

## 2024-10-04 ENCOUNTER — Ambulatory Visit (INDEPENDENT_AMBULATORY_CARE_PROVIDER_SITE_OTHER)

## 2024-10-04 DIAGNOSIS — M79671 Pain in right foot: Secondary | ICD-10-CM

## 2024-10-04 MED ORDER — PREDNISONE 10 MG PO TABS: 30.0000 mg | ORAL_TABLET | Freq: Every day | ORAL | 0 refills | Status: AC

## 2024-10-04 NOTE — ED Triage Notes (Signed)
 Pt c/o pain to achilles tendon area-started yesterday-denies injury-wearing ortho brace to ankle area/baseline for plantar fascitis -NAD-steady gait

## 2024-10-04 NOTE — ED Provider Notes (Signed)
 " [facility]  Note:  This document was prepared using Conservation officer, historic buildings and may include unintentional dictation errors.  MRN: [id number] DOB: 02/26/1986  Subjective:   William Moss is a 39 y.o. male presenting for 1 day history of acute onset of progressively worsening moderate to severe left heel pain along the posterior heel.  No fall, trauma.  Has a history of plantar fasciitis.  Is on Coumadin  for anticoagulation, has a history of DVT and pulmonary embolism.  He does have an orthopedist.  Current Outpatient Medications  Medication Instructions   acetaminophen  (TYLENOL ) 500 mg, Oral, Every 6 hours PRN   amLODipine  (NORVASC ) 10 mg, Oral, Daily   amoxicillin -clavulanate (AUGMENTIN ) 875-125 MG tablet 1 tablet, Oral, Every 12 hours   atorvastatin  (LIPITOR) 80 mg, Oral, Daily   BD Pen Needle Nano U/F 10 Units, Does not apply, Daily   Blood Glucose Monitoring Suppl (ONETOUCH VERIO REFLECT) w/Device KIT Check blood sugar TID E11.69   carvedilol  (COREG ) 25 mg, Oral, 2 times daily   Cyanocobalamin  (B-12) 1000 MCG CAPS Take 1 tablet daily   ferrous sulfate  325 mg, Oral, Daily, Take with a source of Vitamin C   folic acid  (FOLVITE ) 1 mg, Oral, Daily   gabapentin  (NEURONTIN ) 300 mg, Oral, Daily at bedtime   hydrALAZINE  (APRESOLINE ) 50 mg, Oral, Every 8 hours   hydrocortisone  (ANUSOL -HC) 25 mg, Rectal, Daily at bedtime   Lancets (ONETOUCH DELICA PLUS LANCET33G) MISC CHECK BLOOD SUGAR 3 TIMES A DAY   meloxicam  (MOBIC ) 15 mg, Oral, Daily   metFORMIN  (GLUCOPHAGE ) 500 mg, Oral, 2 times daily with meals   Mounjaro  15 mg, Subcutaneous, Weekly   ondansetron  (ZOFRAN ) 4 mg, Oral, Every 8 hours PRN   Riociguat  (ADEMPAS ) 1 MG TABS Rx # 1: Take 1 tab by mouth three times daily x 2 weeks. If systolic BP > 95, then increase to 1.5 tabs three times daily x 2 weeks   Riociguat  (ADEMPAS ) 2 MG TABS Rx # 2: Take 1 tab by mouth three times daily x 2 weeks   warfarin  (COUMADIN ) 10 MG tablet TAKE 1/2 TABLET ON MONDAY, WEDNESDAY, AND FRIDAY. TAKE 1 TABLET ALL OTHER DAYS.    Allergies[1]  Past Medical History:  Diagnosis Date   Atypical chest pain 05/27/2016   a. 05/2016: NST showing small area of moderare anteroapical ischemia b. 05/2016: cath showing tortous but normal cors which confirmed a false positive NST.   Diabetes mellitus without complication (HCC)    GERD (gastroesophageal reflux disease) 10/02/2016   Hypertension    Pulmonary embolism, bilateral (HCC)      Past Surgical History:  Procedure Laterality Date   CARDIAC CATHETERIZATION N/A 06/10/2016   Procedure: Left Heart Cath and Coronary Angiography;  Surgeon: Deatrice DELENA Cage, MD;  Location: [facility];  Service: Cardiovascular;  Laterality: N/A;   COLONOSCOPY N/A 02/04/2016   Procedure: COLONOSCOPY;  Surgeon: Norleen LOISE Kiang, MD;  Location: [facility];  Service: Endoscopy;  Laterality: N/A;   NO PAST SURGERIES      Family History  Problem Relation Age of Onset   Diabetes Mother    Diabetes Father    Pulmonary embolism Father    Cancer Maternal Grandmother    Alzheimer's disease Paternal Grandmother    Breast cancer Cousin    Colon cancer Neg Hx    Esophageal cancer Neg Hx    Stomach cancer Neg Hx     Social History   Occupational History  Occupation: call center rep   Occupation: call center  Tobacco Use   Smoking status: Never   Smokeless tobacco: Never  Vaping Use   Vaping status: Never Used  Substance and Sexual Activity   Alcohol use: No   Drug use: No   Sexual activity: Not Currently     ROS   Objective:   Vitals: BP 125/82 (BP Location: Right Arm)   Pulse 75   Temp 99 F (37.2 C) (Oral)   Resp 20   SpO2 94%   Physical Exam Constitutional:      General: He is not in acute distress.    Appearance: Normal appearance. He is well-developed and normal weight. He is not ill-appearing, toxic-appearing or diaphoretic.  HENT:     Head:  Normocephalic and atraumatic.     Right Ear: External ear normal.     Left Ear: External ear normal.     Nose: Nose normal.     Mouth/Throat:     Pharynx: Oropharynx is clear.  Eyes:     General: No scleral icterus.       Right eye: No discharge.        Left eye: No discharge.     Extraocular Movements: Extraocular movements intact.  Cardiovascular:     Rate and Rhythm: Normal rate.  Pulmonary:     Effort: Pulmonary effort is normal.  Musculoskeletal:     Cervical back: Normal range of motion.       Feet:  Neurological:     Mental Status: He is alert and oriented to person, place, and time.  Psychiatric:        Mood and Affect: Mood normal.        Behavior: Behavior normal.        Thought Content: Thought content normal.        Judgment: Judgment normal.     Assessment and Plan :   PDMP not reviewed this encounter.  1. Pain of right heel    Due to his medications and history of PE, DVT recommend against NSAIDs.  Will use a prednisone  course.  Emphasized need for follow-up with his orthopedist.  Radiology overread is pending but there is what appears to be a bone spur directly over the area that hurts him the most.  Counseled patient on potential for adverse effects with medications prescribed/recommended today, ER and return-to-clinic precautions discussed, patient verbalized understanding.     [1]  Allergies Allergen Reactions   Lisinopril -Hydrochlorothiazide  Other (See Comments)    Interfered with the pH level in the serum  Acidosis type reaction     Christopher Savannah, PA-C 10/04/24 1725  "

## 2024-10-04 NOTE — Discharge Instructions (Signed)
 Go ahead and start prednisone  to help with inflammatory pain of your heel.  Follow-up with your orthopedist as soon as possible.  You can wear your heel splints nightly.

## 2024-10-09 ENCOUNTER — Inpatient Hospital Stay

## 2024-10-09 ENCOUNTER — Inpatient Hospital Stay: Admitting: Hematology and Oncology

## 2024-10-11 ENCOUNTER — Ambulatory Visit: Admitting: Podiatry

## 2024-10-25 ENCOUNTER — Ambulatory Visit: Payer: Self-pay | Admitting: Family Medicine

## 2024-10-31 ENCOUNTER — Ambulatory Visit: Payer: Self-pay | Admitting: Pharmacist

## 2024-11-15 ENCOUNTER — Ambulatory Visit: Admitting: Podiatry

## 2025-01-01 ENCOUNTER — Ambulatory Visit
